# Patient Record
Sex: Male | Born: 1955
Health system: Southern US, Community
[De-identification: ages and names within clinical notes are randomized; demographics above are authoritative.]

## PROBLEM LIST (undated history)

## (undated) DIAGNOSIS — I471 Supraventricular tachycardia, unspecified: Secondary | ICD-10-CM

## (undated) DIAGNOSIS — F32A Depression, unspecified: Secondary | ICD-10-CM

## (undated) DIAGNOSIS — K269 Duodenal ulcer, unspecified as acute or chronic, without hemorrhage or perforation: Secondary | ICD-10-CM

## (undated) DIAGNOSIS — I2510 Atherosclerotic heart disease of native coronary artery without angina pectoris: Secondary | ICD-10-CM

## (undated) DIAGNOSIS — E785 Hyperlipidemia, unspecified: Secondary | ICD-10-CM

## (undated) DIAGNOSIS — N183 Chronic kidney disease, stage 3 unspecified: Secondary | ICD-10-CM

## (undated) DIAGNOSIS — I219 Acute myocardial infarction, unspecified: Secondary | ICD-10-CM

## (undated) DIAGNOSIS — M5415 Radiculopathy, thoracolumbar region: Secondary | ICD-10-CM

## (undated) DIAGNOSIS — R079 Chest pain, unspecified: Secondary | ICD-10-CM

## (undated) DIAGNOSIS — F411 Generalized anxiety disorder: Secondary | ICD-10-CM

## (undated) DIAGNOSIS — I10 Essential (primary) hypertension: Secondary | ICD-10-CM

## (undated) DIAGNOSIS — I639 Cerebral infarction, unspecified: Secondary | ICD-10-CM

## (undated) DIAGNOSIS — R000 Tachycardia, unspecified: Secondary | ICD-10-CM

## (undated) HISTORY — DX: Essential (primary) hypertension: I10

## (undated) HISTORY — DX: Tachycardia, unspecified: R00.0

## (undated) HISTORY — DX: Generalized anxiety disorder: F41.1

## (undated) HISTORY — PX: BRAIN SURGERY: SHX531

## (undated) HISTORY — DX: Radiculopathy, thoracolumbar region: M54.15

## (undated) HISTORY — DX: Chest pain, unspecified: R07.9

## (undated) HISTORY — PX: STOMACH SURGERY: SHX791

## (undated) HISTORY — DX: Duodenal ulcer, unspecified as acute or chronic, without hemorrhage or perforation: K26.9

---

## 2012-08-30 DIAGNOSIS — I671 Cerebral aneurysm, nonruptured: Secondary | ICD-10-CM

## 2012-08-30 HISTORY — DX: Cerebral aneurysm, nonruptured: I67.1

## 2012-08-30 HISTORY — PX: CEREBRAL ANEURYSM REPAIR: SHX164

## 2013-03-16 ENCOUNTER — Emergency Department: Payer: Self-pay | Admitting: Emergency Medicine

## 2013-03-16 LAB — CBC
HCT: 36.4 % — ABNORMAL LOW (ref 40.0–52.0)
HGB: 12.5 g/dL — ABNORMAL LOW (ref 13.0–18.0)
MCHC: 34.3 g/dL (ref 32.0–36.0)
Platelet: 219 10*3/uL (ref 150–440)
RBC: 3.99 10*6/uL — ABNORMAL LOW (ref 4.40–5.90)
RDW: 13.3 % (ref 11.5–14.5)
WBC: 7 10*3/uL (ref 3.8–10.6)

## 2013-03-16 LAB — BASIC METABOLIC PANEL
Anion Gap: 6 — ABNORMAL LOW (ref 7–16)
Calcium, Total: 9 mg/dL (ref 8.5–10.1)
Chloride: 110 mmol/L — ABNORMAL HIGH (ref 98–107)
EGFR (African American): >60
EGFR (Non-African Amer.): >60
Glucose: 138 mg/dL — ABNORMAL HIGH (ref 65–99)
Osmolality: 284 (ref 275–301)
Sodium: 141 mmol/L (ref 136–145)

## 2013-03-16 LAB — PROTIME-INR
INR: 1
Prothrombin Time: 13.7 secs (ref 11.5–14.7)

## 2013-03-16 LAB — TROPONIN I: Troponin-I: <0.02 ng/mL

## 2013-03-17 LAB — TROPONIN I: Troponin-I: <0.02 ng/mL

## 2013-05-14 ENCOUNTER — Emergency Department: Payer: Self-pay | Admitting: Emergency Medicine

## 2013-05-14 LAB — COMPREHENSIVE METABOLIC PANEL
Albumin: 3.6 g/dL (ref 3.4–5.0)
Anion Gap: 5 — ABNORMAL LOW (ref 7–16)
BUN: 18 mg/dL (ref 7–18)
Bilirubin,Total: 0.7 mg/dL (ref 0.2–1.0)
Calcium, Total: 9.6 mg/dL (ref 8.5–10.1)
Chloride: 101 mmol/L (ref 98–107)
Co2: 28 mmol/L (ref 21–32)
EGFR (Non-African Amer.): >60
Glucose: 102 mg/dL — ABNORMAL HIGH (ref 65–99)
Osmolality: 270 (ref 275–301)
Potassium: 3.5 mmol/L (ref 3.5–5.1)
SGOT(AST): 14 U/L — ABNORMAL LOW (ref 15–37)
SGPT (ALT): 19 U/L (ref 12–78)
Sodium: 134 mmol/L — ABNORMAL LOW (ref 136–145)
Total Protein: 7.7 g/dL (ref 6.4–8.2)

## 2013-05-14 LAB — CBC
HCT: 41.7 % (ref 40.0–52.0)
MCV: 88 fL (ref 80–100)
RBC: 4.74 10*6/uL (ref 4.40–5.90)
RDW: 13.3 % (ref 11.5–14.5)

## 2013-05-14 LAB — TROPONIN I: Troponin-I: <0.02 ng/mL

## 2013-06-22 LAB — CBC
HGB: 13.7 g/dL (ref 13.0–18.0)
MCH: 29.4 pg (ref 26.0–34.0)
Platelet: 211 10*3/uL (ref 150–440)
RBC: 4.65 10*6/uL (ref 4.40–5.90)
WBC: 7.1 10*3/uL (ref 3.8–10.6)

## 2013-06-22 LAB — BASIC METABOLIC PANEL
Anion Gap: 8 (ref 7–16)
BUN: 22 mg/dL — ABNORMAL HIGH (ref 7–18)
Calcium, Total: 9 mg/dL (ref 8.5–10.1)
Co2: 26 mmol/L (ref 21–32)
Creatinine: 1.22 mg/dL (ref 0.60–1.30)
Glucose: 118 mg/dL — ABNORMAL HIGH (ref 65–99)

## 2013-06-23 LAB — DRUG SCREEN, URINE
Amphetamines, Ur Screen: NEGATIVE (ref ?–1000)
Cannabinoid 50 Ng, Ur ~~LOC~~: NEGATIVE (ref ?–50)
Cocaine Metabolite,Ur ~~LOC~~: NEGATIVE (ref ?–300)
MDMA (Ecstasy)Ur Screen: NEGATIVE (ref ?–500)
Methadone, Ur Screen: NEGATIVE (ref ?–300)

## 2013-06-23 LAB — CK TOTAL AND CKMB (NOT AT ARMC)
CK, Total: 46 U/L (ref 35–232)
CK-MB: 1.2 ng/mL (ref 0.5–3.6)
CK-MB: 1 ng/mL (ref 0.5–3.6)
CK-MB: 2.5 ng/mL (ref 0.5–3.6)

## 2013-06-23 LAB — HEPATIC FUNCTION PANEL A (ARMC)
Albumin: 3.6 g/dL (ref 3.4–5.0)
Bilirubin, Direct: <0.1 mg/dL (ref 0.00–0.20)
SGOT(AST): 19 U/L (ref 15–37)

## 2013-06-23 LAB — ETHANOL: Ethanol %: <0.003 % (ref 0.000–0.080)

## 2013-06-24 ENCOUNTER — Inpatient Hospital Stay: Payer: Self-pay | Admitting: Specialist

## 2013-06-24 LAB — CBC WITH DIFFERENTIAL/PLATELET
Basophil #: 0 10*3/uL (ref 0.0–0.1)
Eosinophil #: 0.2 10*3/uL (ref 0.0–0.7)
HGB: 14.6 g/dL (ref 13.0–18.0)
Lymphocyte #: 1.5 10*3/uL (ref 1.0–3.6)
Lymphocyte %: 22.7 %
Monocyte #: 0.7 x10 3/mm (ref 0.2–1.0)
Platelet: 216 10*3/uL (ref 150–440)
RDW: 13.9 % (ref 11.5–14.5)
WBC: 6.7 10*3/uL (ref 3.8–10.6)

## 2013-06-24 LAB — BASIC METABOLIC PANEL
BUN: 16 mg/dL (ref 7–18)
Co2: 27 mmol/L (ref 21–32)
EGFR (African American): >60
Sodium: 137 mmol/L (ref 136–145)

## 2013-06-24 LAB — LIPID PANEL
HDL Cholesterol: 37 mg/dL — ABNORMAL LOW (ref 40–60)
Ldl Cholesterol, Calc: 145 mg/dL — ABNORMAL HIGH (ref 0–100)
Triglycerides: 155 mg/dL (ref 0–200)
VLDL Cholesterol, Calc: 31 mg/dL (ref 5–40)

## 2013-06-25 LAB — BASIC METABOLIC PANEL
Anion Gap: 7 (ref 7–16)
Chloride: 103 mmol/L (ref 98–107)
Co2: 26 mmol/L (ref 21–32)
Creatinine: 1.25 mg/dL (ref 0.60–1.30)
EGFR (African American): >60
EGFR (Non-African Amer.): >60
Glucose: 102 mg/dL — ABNORMAL HIGH (ref 65–99)
Sodium: 136 mmol/L (ref 136–145)

## 2013-06-25 LAB — PROTIME-INR
INR: 1
Prothrombin Time: 13.7 secs (ref 11.5–14.7)

## 2013-06-29 ENCOUNTER — Emergency Department: Payer: Self-pay | Admitting: Emergency Medicine

## 2013-06-30 LAB — BASIC METABOLIC PANEL
Anion Gap: 7 (ref 7–16)
BUN: 18 mg/dL (ref 7–18)
Calcium, Total: 9 mg/dL (ref 8.5–10.1)
Co2: 24 mmol/L (ref 21–32)
Creatinine: 1.14 mg/dL (ref 0.60–1.30)
EGFR (Non-African Amer.): >60
Potassium: 3.6 mmol/L (ref 3.5–5.1)
Sodium: 137 mmol/L (ref 136–145)

## 2013-06-30 LAB — CBC
HGB: 14.4 g/dL (ref 13.0–18.0)
MCH: 29.1 pg (ref 26.0–34.0)
MCHC: 33.8 g/dL (ref 32.0–36.0)
MCV: 86 fL (ref 80–100)
RDW: 14 % (ref 11.5–14.5)
WBC: 8 10*3/uL (ref 3.8–10.6)

## 2013-06-30 LAB — PROTIME-INR
INR: 1
Prothrombin Time: 13 secs (ref 11.5–14.7)

## 2013-06-30 LAB — TROPONIN I
Troponin-I: <0.02 ng/mL
Troponin-I: <0.02 ng/mL

## 2013-06-30 LAB — CK TOTAL AND CKMB (NOT AT ARMC): CK, Total: 47 U/L (ref 35–232)

## 2014-01-08 ENCOUNTER — Emergency Department: Payer: Self-pay | Admitting: Emergency Medicine

## 2014-01-08 LAB — COMPREHENSIVE METABOLIC PANEL
ALT: 20 U/L (ref 12–78)
ANION GAP: 5 — AB (ref 7–16)
Albumin: 3.6 g/dL (ref 3.4–5.0)
Alkaline Phosphatase: 58 U/L
BUN: 34 mg/dL — AB (ref 7–18)
Bilirubin,Total: 0.7 mg/dL (ref 0.2–1.0)
CHLORIDE: 106 mmol/L (ref 98–107)
CO2: 25 mmol/L (ref 21–32)
Calcium, Total: 8.6 mg/dL (ref 8.5–10.1)
Creatinine: 1.84 mg/dL — ABNORMAL HIGH (ref 0.60–1.30)
GFR CALC AF AMER: 46 — AB
GFR CALC NON AF AMER: 40 — AB
Glucose: 107 mg/dL — ABNORMAL HIGH (ref 65–99)
Osmolality: 280 (ref 275–301)
Potassium: 4.1 mmol/L (ref 3.5–5.1)
SGOT(AST): 21 U/L (ref 15–37)
Sodium: 136 mmol/L (ref 136–145)
TOTAL PROTEIN: 7.3 g/dL (ref 6.4–8.2)

## 2014-01-08 LAB — CBC
HCT: 41.9 % (ref 40.0–52.0)
HGB: 14.1 g/dL (ref 13.0–18.0)
MCH: 29.3 pg (ref 26.0–34.0)
MCHC: 33.6 g/dL (ref 32.0–36.0)
MCV: 87 fL (ref 80–100)
Platelet: 194 10*3/uL (ref 150–440)
RBC: 4.8 10*6/uL (ref 4.40–5.90)
RDW: 14 % (ref 11.5–14.5)
WBC: 8 10*3/uL (ref 3.8–10.6)

## 2014-01-08 LAB — TROPONIN I

## 2014-01-08 LAB — PROTIME-INR
INR: 1
PROTHROMBIN TIME: 13.4 s (ref 11.5–14.7)

## 2014-01-08 LAB — LIPASE, BLOOD: Lipase: 204 U/L (ref 73–393)

## 2014-01-08 LAB — PRO B NATRIURETIC PEPTIDE: B-TYPE NATIURETIC PEPTID: 65 pg/mL (ref 0–125)

## 2014-01-09 ENCOUNTER — Emergency Department: Payer: Self-pay | Admitting: Emergency Medicine

## 2014-01-10 LAB — BASIC METABOLIC PANEL
Anion Gap: 10 (ref 7–16)
BUN: 23 mg/dL — ABNORMAL HIGH (ref 7–18)
CHLORIDE: 102 mmol/L (ref 98–107)
Calcium, Total: 8.9 mg/dL (ref 8.5–10.1)
Co2: 22 mmol/L (ref 21–32)
Creatinine: 1.42 mg/dL — ABNORMAL HIGH (ref 0.60–1.30)
EGFR (Non-African Amer.): 54 — ABNORMAL LOW
GLUCOSE: 111 mg/dL — AB (ref 65–99)
OSMOLALITY: 273 (ref 275–301)
POTASSIUM: 3.6 mmol/L (ref 3.5–5.1)
Sodium: 134 mmol/L — ABNORMAL LOW (ref 136–145)

## 2014-01-10 LAB — CK TOTAL AND CKMB (NOT AT ARMC)
CK, Total: 84 U/L
CK-MB: 1.5 ng/mL (ref 0.5–3.6)

## 2014-01-10 LAB — CBC
HCT: 42.9 % (ref 40.0–52.0)
HGB: 14.1 g/dL (ref 13.0–18.0)
MCH: 28.9 pg (ref 26.0–34.0)
MCHC: 32.9 g/dL (ref 32.0–36.0)
MCV: 88 fL (ref 80–100)
Platelet: 175 10*3/uL (ref 150–440)
RBC: 4.88 10*6/uL (ref 4.40–5.90)
RDW: 13.9 % (ref 11.5–14.5)
WBC: 7.5 10*3/uL (ref 3.8–10.6)

## 2014-01-10 LAB — PROTIME-INR
INR: 1.1
Prothrombin Time: 14.2 secs (ref 11.5–14.7)

## 2014-01-10 LAB — PRO B NATRIURETIC PEPTIDE: B-Type Natriuretic Peptide: 216 pg/mL — ABNORMAL HIGH (ref 0–125)

## 2014-01-10 LAB — HEPATIC FUNCTION PANEL A (ARMC)
ALT: 20 U/L (ref 12–78)
Albumin: 3.6 g/dL (ref 3.4–5.0)
Alkaline Phosphatase: 62 U/L
Bilirubin, Direct: <0.1 mg/dL (ref 0.00–0.20)
Bilirubin,Total: 0.7 mg/dL (ref 0.2–1.0)
SGOT(AST): 24 U/L (ref 15–37)
Total Protein: 7.4 g/dL (ref 6.4–8.2)

## 2014-01-10 LAB — TROPONIN I

## 2014-01-10 LAB — LIPASE, BLOOD: Lipase: 175 U/L (ref 73–393)

## 2014-01-10 LAB — ETHANOL: Ethanol: <3 mg/dL

## 2014-02-09 ENCOUNTER — Observation Stay: Payer: Self-pay | Admitting: Internal Medicine

## 2014-02-09 LAB — URINALYSIS, COMPLETE
BACTERIA: NONE SEEN
Bilirubin,UR: NEGATIVE
Blood: NEGATIVE
Glucose,UR: NEGATIVE mg/dL (ref 0–75)
Ketone: NEGATIVE
LEUKOCYTE ESTERASE: NEGATIVE
Nitrite: NEGATIVE
Ph: 5 (ref 4.5–8.0)
Protein: NEGATIVE
RBC, UR: NONE SEEN /HPF (ref 0–5)
SPECIFIC GRAVITY: 1.019 (ref 1.003–1.030)
Squamous Epithelial: NONE SEEN
WBC UR: 1 /HPF (ref 0–5)

## 2014-02-09 LAB — CBC WITH DIFFERENTIAL/PLATELET
BASOS PCT: 0.3 %
Basophil #: 0 10*3/uL (ref 0.0–0.1)
Eosinophil #: 0.2 10*3/uL (ref 0.0–0.7)
Eosinophil %: 2.3 %
HCT: 41.7 % (ref 40.0–52.0)
HGB: 13.9 g/dL (ref 13.0–18.0)
Lymphocyte #: 1.1 10*3/uL (ref 1.0–3.6)
Lymphocyte %: 15.4 %
MCH: 29.5 pg (ref 26.0–34.0)
MCHC: 33.4 g/dL (ref 32.0–36.0)
MCV: 88 fL (ref 80–100)
MONO ABS: 0.6 x10 3/mm (ref 0.2–1.0)
MONOS PCT: 8.1 %
Neutrophil #: 5.5 10*3/uL (ref 1.4–6.5)
Neutrophil %: 73.9 %
Platelet: 221 10*3/uL (ref 150–440)
RBC: 4.73 10*6/uL (ref 4.40–5.90)
RDW: 14 % (ref 11.5–14.5)
WBC: 7.4 10*3/uL (ref 3.8–10.6)

## 2014-02-09 LAB — COMPREHENSIVE METABOLIC PANEL
ALBUMIN: 3.4 g/dL (ref 3.4–5.0)
ALK PHOS: 69 U/L
ANION GAP: 8 (ref 7–16)
AST: 14 U/L — AB (ref 15–37)
BILIRUBIN TOTAL: 0.5 mg/dL (ref 0.2–1.0)
BUN: 24 mg/dL — AB (ref 7–18)
CO2: 26 mmol/L (ref 21–32)
Calcium, Total: 8.8 mg/dL (ref 8.5–10.1)
Chloride: 103 mmol/L (ref 98–107)
Creatinine: 1.54 mg/dL — ABNORMAL HIGH (ref 0.60–1.30)
EGFR (African American): 57 — ABNORMAL LOW
EGFR (Non-African Amer.): 49 — ABNORMAL LOW
Glucose: 132 mg/dL — ABNORMAL HIGH (ref 65–99)
OSMOLALITY: 280 (ref 275–301)
Potassium: 4 mmol/L (ref 3.5–5.1)
SGPT (ALT): 21 U/L (ref 12–78)
SODIUM: 137 mmol/L (ref 136–145)
Total Protein: 7.2 g/dL (ref 6.4–8.2)

## 2014-02-09 LAB — TROPONIN I: Troponin-I: <0.02 ng/mL

## 2014-02-09 LAB — LIPASE, BLOOD: Lipase: 132 U/L (ref 73–393)

## 2014-02-10 LAB — CBC WITH DIFFERENTIAL/PLATELET
BASOS ABS: 0 10*3/uL (ref 0.0–0.1)
Basophil %: 0.4 %
EOS ABS: 0.2 10*3/uL (ref 0.0–0.7)
Eosinophil %: 2.7 %
HCT: 38.8 % — AB (ref 40.0–52.0)
HGB: 13.2 g/dL (ref 13.0–18.0)
Lymphocyte #: 0.9 10*3/uL — ABNORMAL LOW (ref 1.0–3.6)
Lymphocyte %: 13 %
MCH: 29.9 pg (ref 26.0–34.0)
MCHC: 34 g/dL (ref 32.0–36.0)
MCV: 88 fL (ref 80–100)
MONO ABS: 0.6 x10 3/mm (ref 0.2–1.0)
Monocyte %: 8.7 %
Neutrophil #: 4.9 10*3/uL (ref 1.4–6.5)
Neutrophil %: 75.2 %
Platelet: 176 10*3/uL (ref 150–440)
RBC: 4.42 10*6/uL (ref 4.40–5.90)
RDW: 13.7 % (ref 11.5–14.5)
WBC: 6.6 10*3/uL (ref 3.8–10.6)

## 2014-02-10 LAB — BASIC METABOLIC PANEL
Anion Gap: 7 (ref 7–16)
BUN: 17 mg/dL (ref 7–18)
CALCIUM: 8.1 mg/dL — AB (ref 8.5–10.1)
CHLORIDE: 110 mmol/L — AB (ref 98–107)
CREATININE: 1.27 mg/dL (ref 0.60–1.30)
Co2: 22 mmol/L (ref 21–32)
EGFR (Non-African Amer.): >60
Glucose: 105 mg/dL — ABNORMAL HIGH (ref 65–99)
Osmolality: 279 (ref 275–301)
Potassium: 3.8 mmol/L (ref 3.5–5.1)
SODIUM: 139 mmol/L (ref 136–145)

## 2014-02-11 LAB — CREATININE, SERUM
Creatinine: 1.25 mg/dL (ref 0.60–1.30)
EGFR (African American): >60
EGFR (Non-African Amer.): >60

## 2014-02-11 LAB — URINE CULTURE

## 2014-02-13 LAB — WOUND CULTURE

## 2014-02-14 LAB — CULTURE, BLOOD (SINGLE)

## 2014-02-17 LAB — WOUND CULTURE

## 2014-03-31 ENCOUNTER — Emergency Department: Payer: Self-pay | Admitting: Emergency Medicine

## 2014-03-31 LAB — URINALYSIS, COMPLETE
Bilirubin,UR: NEGATIVE
Blood: NEGATIVE
GLUCOSE, UR: NEGATIVE mg/dL (ref 0–75)
Hyaline Cast: 2
Ketone: NEGATIVE
LEUKOCYTE ESTERASE: NEGATIVE
Nitrite: NEGATIVE
PROTEIN: NEGATIVE
Ph: 6 (ref 4.5–8.0)
RBC,UR: 2 /HPF (ref 0–5)
SPECIFIC GRAVITY: 1.025 (ref 1.003–1.030)
Squamous Epithelial: <1
WBC UR: 3 /HPF (ref 0–5)

## 2014-04-01 ENCOUNTER — Emergency Department: Payer: Self-pay | Admitting: Emergency Medicine

## 2014-04-01 LAB — URINALYSIS, COMPLETE
BACTERIA: NONE SEEN
BILIRUBIN, UR: NEGATIVE
Hyaline Cast: 2
Leukocyte Esterase: NEGATIVE
Nitrite: NEGATIVE
PH: 5 (ref 4.5–8.0)
Protein: 100
RBC,UR: 2 /HPF (ref 0–5)
Specific Gravity: 1.018 (ref 1.003–1.030)
Squamous Epithelial: NONE SEEN

## 2014-04-01 LAB — CBC
HCT: 44.3 % (ref 40.0–52.0)
HGB: 14.7 g/dL (ref 13.0–18.0)
MCH: 29.8 pg (ref 26.0–34.0)
MCHC: 33.1 g/dL (ref 32.0–36.0)
MCV: 90 fL (ref 80–100)
PLATELETS: 226 10*3/uL (ref 150–440)
RBC: 4.92 10*6/uL (ref 4.40–5.90)
RDW: 14.1 % (ref 11.5–14.5)
WBC: 10.3 10*3/uL (ref 3.8–10.6)

## 2014-04-01 LAB — COMPREHENSIVE METABOLIC PANEL
ALK PHOS: 70 U/L
ANION GAP: 12 (ref 7–16)
AST: 15 U/L (ref 15–37)
Albumin: 4 g/dL (ref 3.4–5.0)
BILIRUBIN TOTAL: 0.5 mg/dL (ref 0.2–1.0)
BUN: 31 mg/dL — ABNORMAL HIGH (ref 7–18)
Calcium, Total: 9.9 mg/dL (ref 8.5–10.1)
Chloride: 99 mmol/L (ref 98–107)
Co2: 23 mmol/L (ref 21–32)
Creatinine: 1.32 mg/dL — ABNORMAL HIGH (ref 0.60–1.30)
EGFR (African American): >60
EGFR (Non-African Amer.): 59 — ABNORMAL LOW
Glucose: 142 mg/dL — ABNORMAL HIGH (ref 65–99)
Osmolality: 277 (ref 275–301)
Potassium: 3.8 mmol/L (ref 3.5–5.1)
SGPT (ALT): 22 U/L
Sodium: 134 mmol/L — ABNORMAL LOW (ref 136–145)
TOTAL PROTEIN: 8.5 g/dL — AB (ref 6.4–8.2)

## 2014-04-01 LAB — LIPASE, BLOOD: LIPASE: 93 U/L (ref 73–393)

## 2014-04-01 LAB — TROPONIN I: Troponin-I: <0.02 ng/mL

## 2014-04-12 ENCOUNTER — Inpatient Hospital Stay: Payer: Self-pay | Admitting: Internal Medicine

## 2014-04-12 HISTORY — PX: ESOPHAGOGASTRODUODENOSCOPY: SHX1529

## 2014-04-12 LAB — CBC
HCT: 21.2 % — ABNORMAL LOW (ref 40.0–52.0)
HGB: 6.8 g/dL — ABNORMAL LOW (ref 13.0–18.0)
MCH: 29.7 pg (ref 26.0–34.0)
MCHC: 32 g/dL (ref 32.0–36.0)
MCV: 93 fL (ref 80–100)
Platelet: 230 10*3/uL (ref 150–440)
RBC: 2.28 10*6/uL — AB (ref 4.40–5.90)
RDW: 14.3 % (ref 11.5–14.5)
WBC: 11.1 10*3/uL — AB (ref 3.8–10.6)

## 2014-04-12 LAB — COMPREHENSIVE METABOLIC PANEL
ANION GAP: 7 (ref 7–16)
AST: 9 U/L — AB (ref 15–37)
Albumin: 2.8 g/dL — ABNORMAL LOW (ref 3.4–5.0)
Alkaline Phosphatase: 40 U/L — ABNORMAL LOW
BUN: 50 mg/dL — ABNORMAL HIGH (ref 7–18)
Bilirubin,Total: 0.3 mg/dL (ref 0.2–1.0)
Calcium, Total: 7.8 mg/dL — ABNORMAL LOW (ref 8.5–10.1)
Chloride: 105 mmol/L (ref 98–107)
Co2: 25 mmol/L (ref 21–32)
Creatinine: 1.56 mg/dL — ABNORMAL HIGH (ref 0.60–1.30)
EGFR (African American): 56 — ABNORMAL LOW
EGFR (Non-African Amer.): 48 — ABNORMAL LOW
Glucose: 168 mg/dL — ABNORMAL HIGH (ref 65–99)
OSMOLALITY: 291 (ref 275–301)
Potassium: 4.3 mmol/L (ref 3.5–5.1)
SGPT (ALT): 28 U/L
Sodium: 137 mmol/L (ref 136–145)
TOTAL PROTEIN: 5.8 g/dL — AB (ref 6.4–8.2)

## 2014-04-12 LAB — PROTIME-INR
INR: 1.1
Prothrombin Time: 13.7 secs (ref 11.5–14.7)

## 2014-04-12 LAB — HEMOGLOBIN: HGB: 7.7 g/dL — ABNORMAL LOW (ref 13.0–18.0)

## 2014-04-12 LAB — LIPASE, BLOOD: LIPASE: 110 U/L (ref 73–393)

## 2014-04-12 LAB — HEMATOCRIT: HCT: 22.9 % — ABNORMAL LOW (ref 40.0–52.0)

## 2014-04-12 LAB — APTT

## 2014-04-13 LAB — URINALYSIS, COMPLETE
Bacteria: NONE SEEN
Bilirubin,UR: NEGATIVE
GLUCOSE, UR: NEGATIVE mg/dL (ref 0–75)
Ketone: NEGATIVE
Leukocyte Esterase: NEGATIVE
Nitrite: NEGATIVE
PROTEIN: NEGATIVE
Ph: 5 (ref 4.5–8.0)
RBC,UR: 6 /HPF (ref 0–5)
Specific Gravity: 1.012 (ref 1.003–1.030)
Squamous Epithelial: NONE SEEN
WBC UR: 1 /HPF (ref 0–5)

## 2014-04-13 LAB — CBC WITH DIFFERENTIAL/PLATELET
BASOS ABS: 0 10*3/uL (ref 0.0–0.1)
BASOS ABS: 0 10*3/uL (ref 0.0–0.1)
BASOS PCT: 0.4 %
Basophil %: 0.3 %
Eosinophil #: 0.2 10*3/uL (ref 0.0–0.7)
Eosinophil #: 0.1 10*3/uL (ref 0.0–0.7)
Eosinophil %: 2.1 %
Eosinophil %: 1.1 %
HCT: 21.5 % — ABNORMAL LOW (ref 40.0–52.0)
HCT: 25.2 % — ABNORMAL LOW (ref 40.0–52.0)
HGB: 7.1 g/dL — ABNORMAL LOW (ref 13.0–18.0)
HGB: 8.4 g/dL — AB (ref 13.0–18.0)
LYMPHS ABS: 1.1 10*3/uL (ref 1.0–3.6)
LYMPHS PCT: 19.3 %
LYMPHS PCT: 11.3 %
Lymphocyte #: 1.7 10*3/uL (ref 1.0–3.6)
MCH: 30.6 pg (ref 26.0–34.0)
MCH: 30 pg (ref 26.0–34.0)
MCHC: 33.3 g/dL (ref 32.0–36.0)
MCHC: 33.2 g/dL (ref 32.0–36.0)
MCV: 92 fL (ref 80–100)
MCV: 90 fL (ref 80–100)
MONOS PCT: 8.5 %
Monocyte #: 0.8 x10 3/mm (ref 0.2–1.0)
Monocyte #: 0.6 x10 3/mm (ref 0.2–1.0)
Monocyte %: 6.8 %
Neutrophil #: 6.2 10*3/uL (ref 1.4–6.5)
Neutrophil #: 7.7 10*3/uL — ABNORMAL HIGH (ref 1.4–6.5)
Neutrophil %: 69.7 %
Neutrophil %: 80.5 %
Platelet: 145 10*3/uL — ABNORMAL LOW (ref 150–440)
Platelet: 132 10*3/uL — ABNORMAL LOW (ref 150–440)
RBC: 2.33 10*6/uL — ABNORMAL LOW (ref 4.40–5.90)
RBC: 2.79 10*6/uL — AB (ref 4.40–5.90)
RDW: 13.9 % (ref 11.5–14.5)
RDW: 14.8 % — ABNORMAL HIGH (ref 11.5–14.5)
WBC: 9 10*3/uL (ref 3.8–10.6)
WBC: 9.5 10*3/uL (ref 3.8–10.6)

## 2014-04-13 LAB — BASIC METABOLIC PANEL
Anion Gap: 7 (ref 7–16)
BUN: 40 mg/dL — ABNORMAL HIGH (ref 7–18)
Calcium, Total: 7.1 mg/dL — ABNORMAL LOW (ref 8.5–10.1)
Chloride: 109 mmol/L — ABNORMAL HIGH (ref 98–107)
Co2: 25 mmol/L (ref 21–32)
Creatinine: 1.18 mg/dL (ref 0.60–1.30)
EGFR (African American): >60
GLUCOSE: 93 mg/dL (ref 65–99)
Osmolality: 291 (ref 275–301)
POTASSIUM: 4.3 mmol/L (ref 3.5–5.1)
SODIUM: 141 mmol/L (ref 136–145)

## 2014-04-13 LAB — MAGNESIUM: MAGNESIUM: 1.8 mg/dL

## 2014-04-14 DIAGNOSIS — K922 Gastrointestinal hemorrhage, unspecified: Secondary | ICD-10-CM | POA: Diagnosis not present

## 2014-04-14 LAB — CBC WITH DIFFERENTIAL/PLATELET
BASOS ABS: 0 10*3/uL (ref 0.0–0.1)
BASOS ABS: 0 10*3/uL (ref 0.0–0.1)
Basophil %: 0.5 %
Basophil %: 0.3 %
EOS ABS: 0.2 10*3/uL (ref 0.0–0.7)
EOS PCT: 0.1 %
Eosinophil #: 0 10*3/uL (ref 0.0–0.7)
Eosinophil %: 3 %
HCT: 20 % — AB (ref 40.0–52.0)
HCT: 35.6 % — ABNORMAL LOW (ref 40.0–52.0)
HGB: 6.8 g/dL — AB (ref 13.0–18.0)
HGB: 12 g/dL — ABNORMAL LOW (ref 13.0–18.0)
LYMPHS ABS: 1.3 10*3/uL (ref 1.0–3.6)
LYMPHS PCT: 3 %
Lymphocyte #: 0.5 10*3/uL — ABNORMAL LOW (ref 1.0–3.6)
Lymphocyte %: 19.6 %
MCH: 30.5 pg (ref 26.0–34.0)
MCH: 29.8 pg (ref 26.0–34.0)
MCHC: 34 g/dL (ref 32.0–36.0)
MCHC: 33.6 g/dL (ref 32.0–36.0)
MCV: 90 fL (ref 80–100)
MCV: 89 fL (ref 80–100)
MONOS PCT: 7.8 %
Monocyte #: 0.5 x10 3/mm (ref 0.2–1.0)
Monocyte #: 1.2 x10 3/mm — ABNORMAL HIGH (ref 0.2–1.0)
Monocyte %: 7.9 %
Neutrophil #: 4.5 10*3/uL (ref 1.4–6.5)
Neutrophil #: 13.5 10*3/uL — ABNORMAL HIGH (ref 1.4–6.5)
Neutrophil %: 69.1 %
Neutrophil %: 88.7 %
PLATELETS: 153 10*3/uL (ref 150–440)
Platelet: 124 10*3/uL — ABNORMAL LOW (ref 150–440)
RBC: 2.23 10*6/uL — ABNORMAL LOW (ref 4.40–5.90)
RBC: 4.01 10*6/uL — ABNORMAL LOW (ref 4.40–5.90)
RDW: 15.4 % — AB (ref 11.5–14.5)
RDW: 14.9 % — ABNORMAL HIGH (ref 11.5–14.5)
WBC: 6.5 10*3/uL (ref 3.8–10.6)
WBC: 15.3 10*3/uL — AB (ref 3.8–10.6)

## 2014-04-14 LAB — COMPREHENSIVE METABOLIC PANEL
ALBUMIN: 2.2 g/dL — AB (ref 3.4–5.0)
ALK PHOS: 36 U/L — AB
Anion Gap: 10 (ref 7–16)
BILIRUBIN TOTAL: 1.4 mg/dL — AB (ref 0.2–1.0)
BUN: 31 mg/dL — ABNORMAL HIGH (ref 7–18)
CHLORIDE: 108 mmol/L — AB (ref 98–107)
CREATININE: 1.14 mg/dL (ref 0.60–1.30)
Calcium, Total: 7.6 mg/dL — ABNORMAL LOW (ref 8.5–10.1)
Co2: 20 mmol/L — ABNORMAL LOW (ref 21–32)
EGFR (African American): >60
EGFR (Non-African Amer.): >60
Glucose: 210 mg/dL — ABNORMAL HIGH (ref 65–99)
Osmolality: 288 (ref 275–301)
Potassium: 5.1 mmol/L (ref 3.5–5.1)
SGOT(AST): 24 U/L (ref 15–37)
SGPT (ALT): 25 U/L
Sodium: 138 mmol/L (ref 136–145)
Total Protein: 4.7 g/dL — ABNORMAL LOW (ref 6.4–8.2)

## 2014-04-14 LAB — BASIC METABOLIC PANEL
Anion Gap: 6 — ABNORMAL LOW (ref 7–16)
BUN: 29 mg/dL — ABNORMAL HIGH (ref 7–18)
CALCIUM: 6.8 mg/dL — AB (ref 8.5–10.1)
CREATININE: 1.06 mg/dL (ref 0.60–1.30)
Chloride: 111 mmol/L — ABNORMAL HIGH (ref 98–107)
Co2: 25 mmol/L (ref 21–32)
EGFR (African American): >60
Glucose: 85 mg/dL (ref 65–99)
Osmolality: 288 (ref 275–301)
Potassium: 4 mmol/L (ref 3.5–5.1)
Sodium: 142 mmol/L (ref 136–145)

## 2014-04-14 LAB — HEMOGLOBIN: HGB: 8.7 g/dL — ABNORMAL LOW (ref 13.0–18.0)

## 2014-04-14 LAB — HEMATOCRIT: HCT: 26.4 % — ABNORMAL LOW (ref 40.0–52.0)

## 2014-04-15 LAB — BASIC METABOLIC PANEL
Anion Gap: 8 (ref 7–16)
BUN: 26 mg/dL — ABNORMAL HIGH (ref 7–18)
Calcium, Total: 7.5 mg/dL — ABNORMAL LOW (ref 8.5–10.1)
Chloride: 108 mmol/L — ABNORMAL HIGH (ref 98–107)
Co2: 24 mmol/L (ref 21–32)
Creatinine: 1.19 mg/dL (ref 0.60–1.30)
EGFR (African American): >60
EGFR (Non-African Amer.): >60
Glucose: 163 mg/dL — ABNORMAL HIGH (ref 65–99)
OSMOLALITY: 288 (ref 275–301)
Potassium: 4.9 mmol/L (ref 3.5–5.1)
Sodium: 140 mmol/L (ref 136–145)

## 2014-04-15 LAB — CBC WITH DIFFERENTIAL/PLATELET
Basophil #: 0 10*3/uL (ref 0.0–0.1)
Basophil %: 0 %
EOS PCT: 0 %
Eosinophil #: 0 10*3/uL (ref 0.0–0.7)
HCT: 31.2 % — ABNORMAL LOW (ref 40.0–52.0)
HGB: 10.2 g/dL — AB (ref 13.0–18.0)
Lymphocyte #: 0.4 10*3/uL — ABNORMAL LOW (ref 1.0–3.6)
Lymphocyte %: 3.2 %
MCH: 29.3 pg (ref 26.0–34.0)
MCHC: 32.6 g/dL (ref 32.0–36.0)
MCV: 90 fL (ref 80–100)
Monocyte #: 1.1 x10 3/mm — ABNORMAL HIGH (ref 0.2–1.0)
Monocyte %: 8.8 %
NEUTROS ABS: 11.5 10*3/uL — AB (ref 1.4–6.5)
NEUTROS PCT: 88 %
PLATELETS: 121 10*3/uL — AB (ref 150–440)
RBC: 3.47 10*6/uL — ABNORMAL LOW (ref 4.40–5.90)
RDW: 15.4 % — ABNORMAL HIGH (ref 11.5–14.5)
WBC: 13.1 10*3/uL — ABNORMAL HIGH (ref 3.8–10.6)

## 2014-04-16 LAB — CBC WITH DIFFERENTIAL/PLATELET
BASOS ABS: 0 10*3/uL (ref 0.0–0.1)
BASOS PCT: 0.3 %
Eosinophil #: 0.1 10*3/uL (ref 0.0–0.7)
Eosinophil %: 1.1 %
HCT: 25.5 % — AB (ref 40.0–52.0)
HGB: 8.7 g/dL — ABNORMAL LOW (ref 13.0–18.0)
LYMPHS PCT: 8.5 %
Lymphocyte #: 0.6 10*3/uL — ABNORMAL LOW (ref 1.0–3.6)
MCH: 30.6 pg (ref 26.0–34.0)
MCHC: 33.9 g/dL (ref 32.0–36.0)
MCV: 90 fL (ref 80–100)
MONO ABS: 0.6 x10 3/mm (ref 0.2–1.0)
Monocyte %: 7.9 %
NEUTROS ABS: 6.1 10*3/uL (ref 1.4–6.5)
NEUTROS PCT: 82.2 %
Platelet: 118 10*3/uL — ABNORMAL LOW (ref 150–440)
RBC: 2.83 10*6/uL — ABNORMAL LOW (ref 4.40–5.90)
RDW: 15.5 % — ABNORMAL HIGH (ref 11.5–14.5)
WBC: 7.4 10*3/uL (ref 3.8–10.6)

## 2014-04-16 LAB — BASIC METABOLIC PANEL
Anion Gap: 8 (ref 7–16)
BUN: 17 mg/dL (ref 7–18)
CHLORIDE: 107 mmol/L (ref 98–107)
CREATININE: 1.22 mg/dL (ref 0.60–1.30)
Calcium, Total: 7.6 mg/dL — ABNORMAL LOW (ref 8.5–10.1)
Co2: 27 mmol/L (ref 21–32)
EGFR (African American): >60
GLUCOSE: 103 mg/dL — AB (ref 65–99)
OSMOLALITY: 285 (ref 275–301)
POTASSIUM: 4.1 mmol/L (ref 3.5–5.1)
Sodium: 142 mmol/L (ref 136–145)

## 2014-04-17 LAB — CK TOTAL AND CKMB (NOT AT ARMC)
CK, Total: 60 U/L
CK, Total: 54 U/L
CK-MB: 2.3 ng/mL (ref 0.5–3.6)
CK-MB: 2.2 ng/mL (ref 0.5–3.6)

## 2014-04-17 LAB — BASIC METABOLIC PANEL
Anion Gap: 12 (ref 7–16)
BUN: 11 mg/dL (ref 7–18)
CALCIUM: 8.1 mg/dL — AB (ref 8.5–10.1)
Chloride: 105 mmol/L (ref 98–107)
Co2: 24 mmol/L (ref 21–32)
Creatinine: 1.19 mg/dL (ref 0.60–1.30)
EGFR (African American): >60
EGFR (Non-African Amer.): >60
Glucose: 77 mg/dL (ref 65–99)
Osmolality: 279 (ref 275–301)
Potassium: 3.7 mmol/L (ref 3.5–5.1)
SODIUM: 141 mmol/L (ref 136–145)

## 2014-04-17 LAB — CBC WITH DIFFERENTIAL/PLATELET
Basophil #: 0.1 10*3/uL (ref 0.0–0.1)
Basophil %: 0.6 %
Eosinophil #: 0.2 10*3/uL (ref 0.0–0.7)
Eosinophil %: 1.9 %
HCT: 27.5 % — AB (ref 40.0–52.0)
HGB: 9.4 g/dL — AB (ref 13.0–18.0)
LYMPHS PCT: 11.1 %
Lymphocyte #: 1 10*3/uL (ref 1.0–3.6)
MCH: 30.9 pg (ref 26.0–34.0)
MCHC: 34.3 g/dL (ref 32.0–36.0)
MCV: 90 fL (ref 80–100)
MONO ABS: 0.7 x10 3/mm (ref 0.2–1.0)
Monocyte %: 7.8 %
Neutrophil #: 7.4 10*3/uL — ABNORMAL HIGH (ref 1.4–6.5)
Neutrophil %: 78.6 %
PLATELETS: 167 10*3/uL (ref 150–440)
RBC: 3.05 10*6/uL — ABNORMAL LOW (ref 4.40–5.90)
RDW: 15.6 % — AB (ref 11.5–14.5)
WBC: 9.5 10*3/uL (ref 3.8–10.6)

## 2014-04-17 LAB — TROPONIN I
TROPONIN-I: 0.17 ng/mL — AB
Troponin-I: 0.21 ng/mL — ABNORMAL HIGH

## 2014-04-18 LAB — CK TOTAL AND CKMB (NOT AT ARMC)
CK, Total: 42 U/L
CK-MB: 2.5 ng/mL (ref 0.5–3.6)

## 2014-04-18 LAB — CBC WITH DIFFERENTIAL/PLATELET
BASOS PCT: 0.6 %
Basophil #: 0.1 10*3/uL (ref 0.0–0.1)
EOS ABS: 0.2 10*3/uL (ref 0.0–0.7)
Eosinophil %: 2.2 %
HCT: 27.1 % — ABNORMAL LOW (ref 40.0–52.0)
HGB: 9.1 g/dL — ABNORMAL LOW (ref 13.0–18.0)
LYMPHS ABS: 0.6 10*3/uL — AB (ref 1.0–3.6)
Lymphocyte %: 7.6 %
MCH: 30.5 pg (ref 26.0–34.0)
MCHC: 33.6 g/dL (ref 32.0–36.0)
MCV: 91 fL (ref 80–100)
Monocyte #: 0.7 x10 3/mm (ref 0.2–1.0)
Monocyte %: 7.8 %
NEUTROS ABS: 6.9 10*3/uL — AB (ref 1.4–6.5)
Neutrophil %: 81.8 %
PLATELETS: 177 10*3/uL (ref 150–440)
RBC: 2.99 10*6/uL — ABNORMAL LOW (ref 4.40–5.90)
RDW: 15.2 % — AB (ref 11.5–14.5)
WBC: 8.4 10*3/uL (ref 3.8–10.6)

## 2014-04-18 LAB — TROPONIN I: Troponin-I: 0.14 ng/mL — ABNORMAL HIGH

## 2014-05-01 ENCOUNTER — Inpatient Hospital Stay: Payer: Self-pay | Admitting: Internal Medicine

## 2014-05-01 LAB — CBC WITH DIFFERENTIAL/PLATELET
Basophil #: 0 10*3/uL (ref 0.0–0.1)
Basophil %: 0.8 %
Eosinophil #: 0.2 10*3/uL (ref 0.0–0.7)
Eosinophil %: 4.6 %
HCT: 30.1 % — ABNORMAL LOW (ref 40.0–52.0)
HGB: 10.1 g/dL — ABNORMAL LOW (ref 13.0–18.0)
LYMPHS PCT: 28.1 %
Lymphocyte #: 1.3 10*3/uL (ref 1.0–3.6)
MCH: 28.8 pg (ref 26.0–34.0)
MCHC: 33.5 g/dL (ref 32.0–36.0)
MCV: 86 fL (ref 80–100)
MONO ABS: 0.5 x10 3/mm (ref 0.2–1.0)
MONOS PCT: 10.1 %
Neutrophil #: 2.7 10*3/uL (ref 1.4–6.5)
Neutrophil %: 56.4 %
Platelet: 339 10*3/uL (ref 150–440)
RBC: 3.51 10*6/uL — ABNORMAL LOW (ref 4.40–5.90)
RDW: 16.8 % — ABNORMAL HIGH (ref 11.5–14.5)
WBC: 4.8 10*3/uL (ref 3.8–10.6)

## 2014-05-01 LAB — URINALYSIS, COMPLETE
BACTERIA: NONE SEEN
BLOOD: NEGATIVE
Bilirubin,UR: NEGATIVE
Glucose,UR: NEGATIVE mg/dL (ref 0–75)
Ketone: NEGATIVE
LEUKOCYTE ESTERASE: NEGATIVE
NITRITE: NEGATIVE
PH: 6 (ref 4.5–8.0)
Protein: NEGATIVE
RBC,UR: 1 /HPF (ref 0–5)
SPECIFIC GRAVITY: 1.035 (ref 1.003–1.030)
Squamous Epithelial: <1
WBC UR: <1 /HPF (ref 0–5)

## 2014-05-01 LAB — CK-MB
CK-MB: <0.5 ng/mL — ABNORMAL LOW (ref 0.5–3.6)
CK-MB: 0.6 ng/mL (ref 0.5–3.6)
CK-MB: <0.5 ng/mL — ABNORMAL LOW (ref 0.5–3.6)

## 2014-05-01 LAB — TROPONIN I
Troponin-I: <0.02 ng/mL
Troponin-I: <0.02 ng/mL
Troponin-I: <0.02 ng/mL

## 2014-05-01 LAB — COMPREHENSIVE METABOLIC PANEL
Albumin: 2.9 g/dL — ABNORMAL LOW (ref 3.4–5.0)
Alkaline Phosphatase: 61 U/L
Anion Gap: 9 (ref 7–16)
BUN: 23 mg/dL — ABNORMAL HIGH (ref 7–18)
Bilirubin,Total: 0.3 mg/dL (ref 0.2–1.0)
CHLORIDE: 105 mmol/L (ref 98–107)
CREATININE: 1.8 mg/dL — AB (ref 0.60–1.30)
Calcium, Total: 8.9 mg/dL (ref 8.5–10.1)
Co2: 24 mmol/L (ref 21–32)
GFR CALC AF AMER: 47 — AB
GFR CALC NON AF AMER: 40 — AB
Glucose: 88 mg/dL (ref 65–99)
Osmolality: 279 (ref 275–301)
POTASSIUM: 4.5 mmol/L (ref 3.5–5.1)
SGOT(AST): 34 U/L (ref 15–37)
SGPT (ALT): 15 U/L
Sodium: 138 mmol/L (ref 136–145)
TOTAL PROTEIN: 7 g/dL (ref 6.4–8.2)

## 2014-05-01 LAB — CK TOTAL AND CKMB (NOT AT ARMC)
CK, Total: 96 U/L
CK-MB: <0.5 ng/mL — ABNORMAL LOW (ref 0.5–3.6)

## 2014-05-02 LAB — BASIC METABOLIC PANEL
Anion Gap: 6 — ABNORMAL LOW (ref 7–16)
BUN: 20 mg/dL — ABNORMAL HIGH (ref 7–18)
CALCIUM: 8.5 mg/dL (ref 8.5–10.1)
CO2: 26 mmol/L (ref 21–32)
Chloride: 108 mmol/L — ABNORMAL HIGH (ref 98–107)
Creatinine: 1.49 mg/dL — ABNORMAL HIGH (ref 0.60–1.30)
EGFR (African American): 59 — ABNORMAL LOW
GFR CALC NON AF AMER: 51 — AB
Glucose: 86 mg/dL (ref 65–99)
Osmolality: 281 (ref 275–301)
POTASSIUM: 3.9 mmol/L (ref 3.5–5.1)
SODIUM: 140 mmol/L (ref 136–145)

## 2014-05-03 LAB — BASIC METABOLIC PANEL
ANION GAP: 9 (ref 7–16)
BUN: 15 mg/dL (ref 7–18)
Calcium, Total: 8.2 mg/dL — ABNORMAL LOW (ref 8.5–10.1)
Chloride: 108 mmol/L — ABNORMAL HIGH (ref 98–107)
Co2: 27 mmol/L (ref 21–32)
Creatinine: 1.49 mg/dL — ABNORMAL HIGH (ref 0.60–1.30)
EGFR (Non-African Amer.): 51 — ABNORMAL LOW
GFR CALC AF AMER: 59 — AB
Glucose: 104 mg/dL — ABNORMAL HIGH (ref 65–99)
Osmolality: 288 (ref 275–301)
Potassium: 3.8 mmol/L (ref 3.5–5.1)
Sodium: 144 mmol/L (ref 136–145)

## 2014-05-03 LAB — CBC WITH DIFFERENTIAL/PLATELET
Basophil #: 0 10*3/uL (ref 0.0–0.1)
Basophil %: 1.1 %
EOS PCT: 5.2 %
Eosinophil #: 0.2 10*3/uL (ref 0.0–0.7)
HCT: 28.8 % — AB (ref 40.0–52.0)
HGB: 9.2 g/dL — ABNORMAL LOW (ref 13.0–18.0)
LYMPHS ABS: 1.1 10*3/uL (ref 1.0–3.6)
Lymphocyte %: 25.1 %
MCH: 28 pg (ref 26.0–34.0)
MCHC: 32 g/dL (ref 32.0–36.0)
MCV: 88 fL (ref 80–100)
MONO ABS: 0.4 x10 3/mm (ref 0.2–1.0)
MONOS PCT: 10.1 %
Neutrophil #: 2.5 10*3/uL (ref 1.4–6.5)
Neutrophil %: 58.5 %
Platelet: 224 10*3/uL (ref 150–440)
RBC: 3.29 10*6/uL — ABNORMAL LOW (ref 4.40–5.90)
RDW: 16.5 % — AB (ref 11.5–14.5)
WBC: 4.3 10*3/uL (ref 3.8–10.6)

## 2014-10-07 ENCOUNTER — Ambulatory Visit: Payer: Self-pay | Admitting: Pain Medicine

## 2014-10-11 IMAGING — CR DG CHEST 2V
1 series · 2 of 2 positions shown · non-contrast
Comparison: none

REASON FOR EXAM: Chest Pain
COMMENTS:

[Series 1: w chest pa · 0.14mm/px · 2 of 2 slices shown]
[im 1/2]
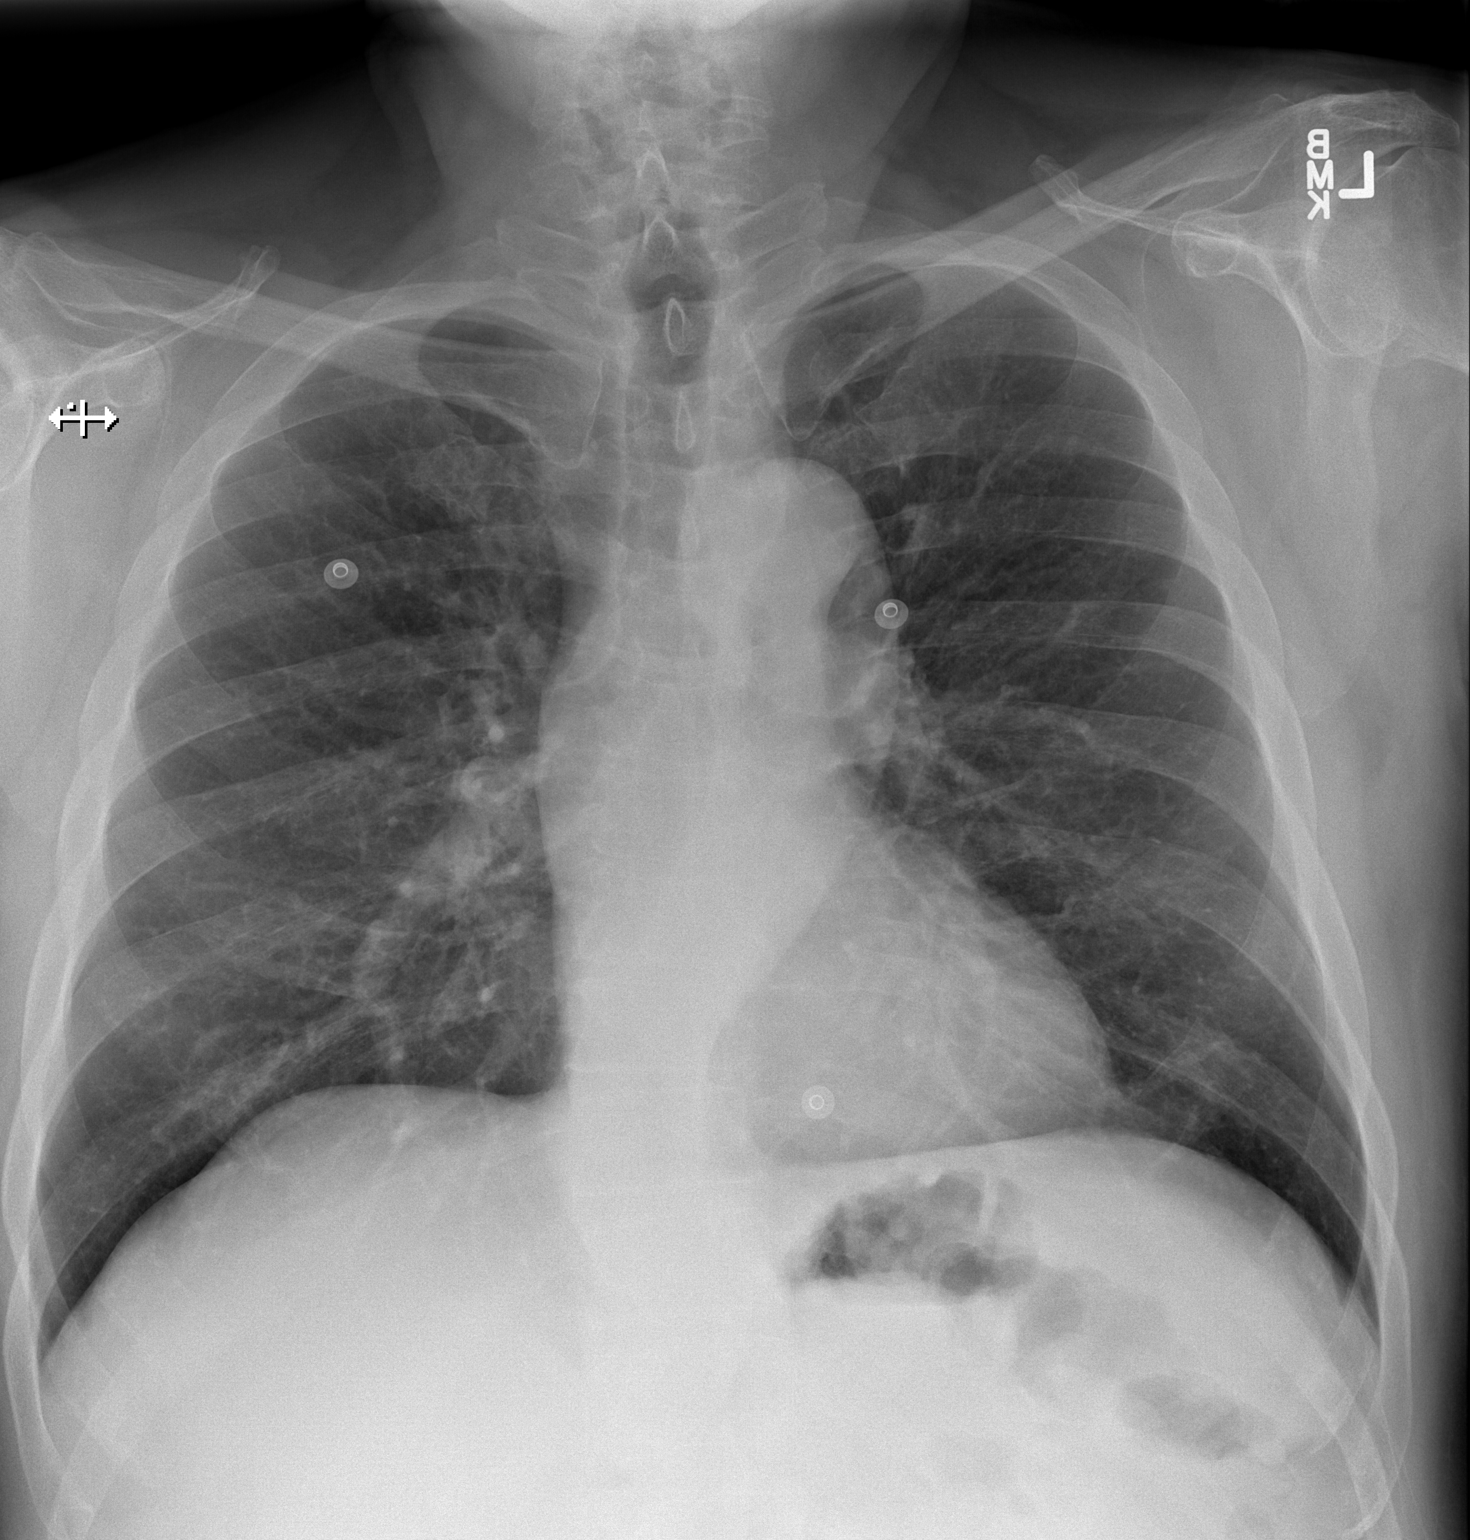
[im 2/2]
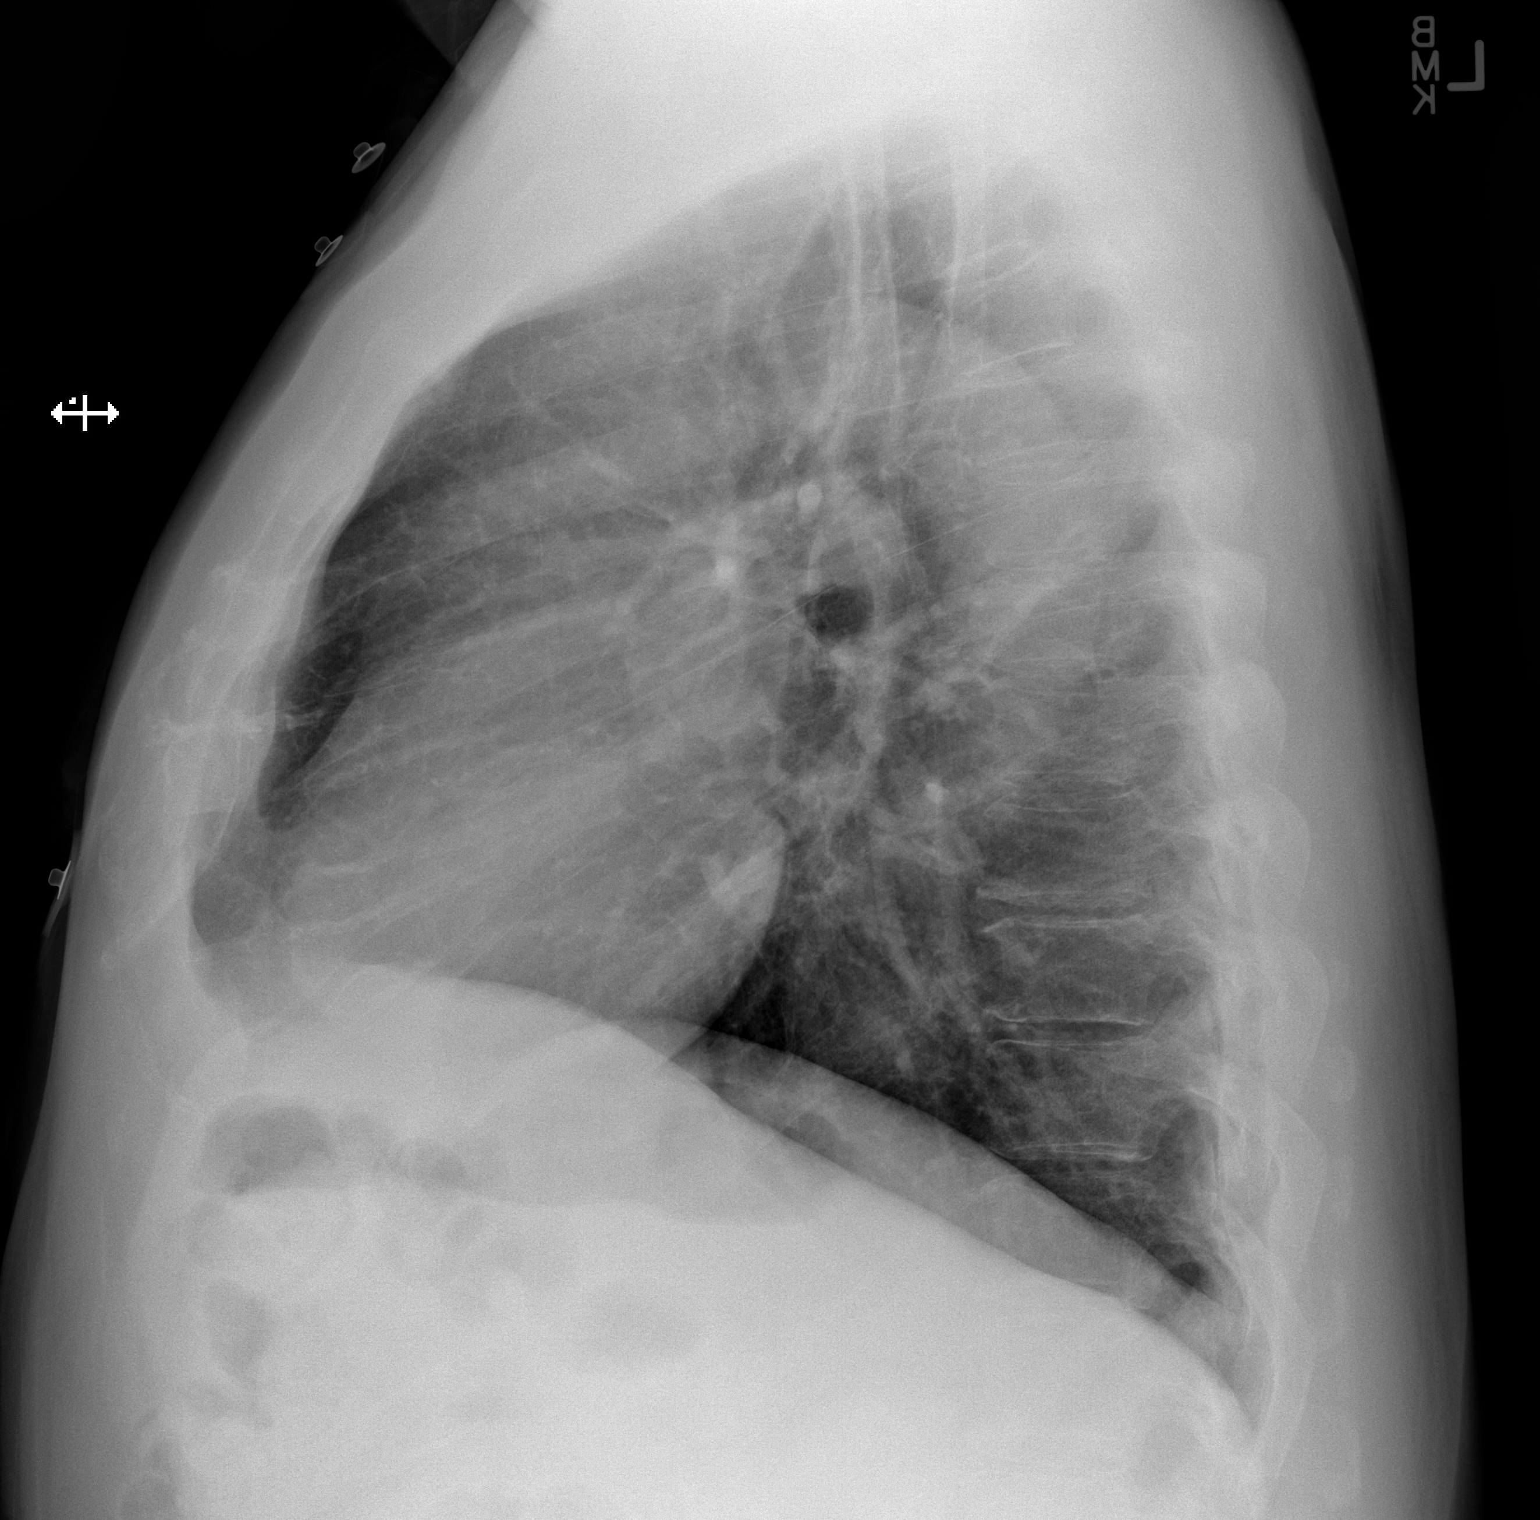

[2 of 2 positions shown; findings below may reference images not displayed]

PROCEDURE:     DXR - DXR CHEST PA (OR AP) AND LATERAL  - June 29, 2013 [DATE]

RESULT:     Comparison is made to the study of 06/23/2013.

The lungs are clear. The heart and pulmonary vessels are normal. The bony
and mediastinal structures are unremarkable. There is no effusion. There is
no pneumothorax or evidence of congestive failure.
IMPRESSION: No acute cardiopulmonary disease. Stable appearance.

[REDACTED]

## 2014-12-20 NOTE — H&P (Signed)
PATIENT NAME:  Howard Weaver, Howard Weaver MR#:  [id number] DATE OF BIRTH:  10-Jun-1956  DATE OF ADMISSION:  06/22/2013  REFERRING PHYSICIAN: Lurline Hare M.D.   PRIMARY CARE PHYSICIAN: None.   CHIEF COMPLAINT: Uncontrolled blood pressure and chest pain.   HISTORY OF PRESENT ILLNESS: This is a 58 year old male with significant past medical history of brain aneurysm status post bleed and clipping, presents with complaints of uncontrolled blood pressure.  The patient  was not feeling well and chest pain. The patient reports that he recently moved from [place] and does not have primary care physician here. The patient lives with his son who helps take care of him. The patient has history of brain aneurysm rupture and intracerebral bleed, status post neurosurgery procedure including clipping and hematoma evacuation; as well, the patient reported this was in July of last year. As well, he is on service of hypertension, uncontrolled; the patient recently moved from [place] here. He does not have a PCP due to financial reasons, the patient reports he has been twice to the ED where he had he had refill for his Catapres prescription. The patient reports he has not been feeling well for the last two days. As well, son reports he has been feeling his father not being himself, slightly confused, more forgetful than his baseline. The patient's CT brain did not show any acute finding, but did show evidence of atrophy, and ischemic vessel disease. The patient had uncontrolled blood pressure upon presentation, which he did require IV hydralazine and nitro paste to control it, as well, he reported chest pain, he had over the last day, reported as midsternal, nonradiating, resolved on its own without any intervention. The patient's troponin was negative. EKG was done, did not show any acute ST or T wave changes, but did show evidence of Q waves in the inferior leads. Hospitalist service were requested to admit the patient for  further evaluation of his chest pain, as well, for better control of his blood pressure.   PAST MEDICAL HISTORY: 1.  Hypertension.  2.  History of brain aneurysm, status post-surgery and clipping 1-1/2 years ago.   FAMILY HISTORY: The patient denies any family history of coronary artery disease at young age.   SOCIAL HISTORY: The patient does not smoke. No illicit drug use. Drinks alcohol occasionally, 1 to 2 beers 2 times a week.   HOME MEDICATIONS:  1.  Clonidine 0.1 mg oral 2 times a day.  2.  Hydrochlorothiazide 25 mg oral daily.   ALLERGIES: REPORTS ALLERGY TO PENCILLIN, ALLERGY TO IBUPROFEN, reports it is just causing a stomach upset.   REVIEW OF SYSTEMS: CONSTITUTIONAL: Denies fever, chills. Complains of fatigue, weakness. Denies weight gain, weight loss.  EYES: Denies blurry vision, double vision, pain, inflammation, glaucoma.  ENT: Denies tinnitus, ear pain, hearing loss, epistaxis or discharge.  RESPIRATORY: Denies cough, wheezing, hemoptysis, dyspnea, COPD. Marland Kitchen  CARDIOVASCULAR: Complains of chest pain, currently resolved. Denies edema, arrhythmia, palpitations or syncope.  GASTROINTESTINAL: Denies nausea, vomiting, diarrhea, abdominal pain, hematemesis, jaundice.  GENITOURINARY: Denies dysuria, hematuria, or renal colic. ENDOCRINE:  Denies  polyuria, polydipsia, heat or cold intolerance.  HEMATOLOGY: Denies anemia, easy bruising, bleeding diathesis.  INTEGUMENTARY: Denies acne, rash or skin lesions.  MUSCULOSKELETAL: Denies any swelling, gout, arthritis or cramps.  NEUROLOGICAL: Denies any ataxia, dementia, tremors, vertigo, complains of headache currently resolved.   PSYCHIATRIC: Denies insomnia, bipolar disorder, substance or alcohol abuse. Reports anxiety.   PHYSICAL EXAMINATION: VITAL SIGNS:  Pulse 74, respiratory rate  18, blood pressure 155/98, 97% on room air.  GENERAL: Elderly male, lays comfortable in bed, in no apparent distress.  HEENT: Head normocephalic. Pupils  equal, reactive to light. Pink conjunctivae. Anicteric sclerae. Moist oral mucosa, had midline surgical scar at the back of his head.   NECK: Supple. No thyromegaly. No JVD.  CHEST: Good air entry bilaterally. No wheezing, rales, rhonchi.  CARDIOVASCULAR: S1, S2 heard. No rubs, murmurs or gallops.   ABDOMEN: Soft, nontender, nondistended. Bowel sounds present.  EXTREMITIES: No edema. No clubbing. No cyanosis, Pedal pulses felt bilaterally.  PSYCHIATRIC: Appropriate affect. Awake, alert x 3. Intact judgment and insight.  NEUROLOGIC: Cranial nerves grossly intact. Motor 5 out of 5. No focal deficits.  LYMPHATICS: No cervical or supraclavicular lymphadenopathy.  MUSCULOSKELETAL: No joint effusion or erythema.   PERTINENT LABORATORY DATA: Glucose 118, BUN 22, creatinine 1.22, sodium 138, potassium 3.3, chloride 104, CO2 26, anion gap 8. Troponin less than 0.02. CK total of 57, ALT 19, AST 19, alk phos 74. Urine drugs negative for the entire panel. White blood cells 7.1, hemoglobin 13.7, hematocrit 40, platelets 211. EKG showing normal sinus rhythm at 68 beats with Q waves in the inferior leads.   ASSESSMENT AND PLAN: 1.  Chest pain. This is most likely related to his uncontrolled hypertension. The patient already received 324 of aspirin in ED. Will be admitted to telemetry unit. We will continue to cycle his cardiac enzymes. Currently, the patient is chest pain-free.  2. Hypertension, uncontrolled. We will resume the patient back on his home meds, hydrochlorothiazide and clonidine. We will add Norvasc and will have him on p.r.n. intravenous hydralazine as needed.  3. Deep vein thrombosis prophylaxis. The patient is ambulatory. We will have him on sequential compression device and TED hose.   Total time spent on admission and patient care: 40 minutes.    ____________________________ Albertine Patricia, MD dse:NTS D: 06/23/2013 04:37:47 ET T: 06/23/2013 04:54:47 ET JOB#: [id number]  cc: Albertine Patricia, MD, <Dictator> Jimmi Sidener Graciela Husbands MD ELECTRONICALLY SIGNED 06/23/2013 6:45

## 2014-12-20 NOTE — Discharge Summary (Signed)
PATIENT NAME:  Howard Weaver, Howard Weaver MR#:  [id number] DATE OF BIRTH:  Nov 11, 1955  DATE OF ADMISSION:  06/24/2013 DATE OF DISCHARGE:  06/25/2013  For detailed note, please take a look at the history and physical done on admission by Dr. Randol Weaver.    DIAGNOSES AT DISCHARGE: As follows: Malignant hypertension, elevated troponin likely in the setting of malignant hypertension, back pain.   DIET: The patient is being discharged on a low-sodium diet.   ACTIVITY: As tolerated.   FOLLOWUP: With Dr. Adrian BlackwaterShaukat Weaver in the next 1 week.   DISCHARGE MEDICATIONS: Catapres 0.1 mg b.i.d., Ultram 50 mg every 4 hours as needed for pain, Imdur 30 mg daily, hydralazine 50 mg b.i.d. and aspirin 81 mg daily.   CONSULTANTS DURING THE HOSPITAL COURSE: Dr. Adrian BlackwaterShaukat Weaver from cardiology.   PERTINENT STUDIES DONE DURING THE HOSPITAL COURSE: As follows: CT scan of the head done without contrast showing chronic involutional changes without evidence of acute abnormalities. A chest x-ray done on admission showing no acute cardiopulmonary disease. A cardiac catheterization done on October 27th showing distal 40% stenosis of the LAD but no significant coronary artery disease that needed acute intervention.   HOSPITAL COURSE: This is a 59 year old male who presented to the hospital with chest pain and noted to have significantly elevated blood pressures.  1. Malignant hypertension: When the patient presented to the hospital, his systolic blood pressures were over 170s, diastolics over 110. The patient was restarted back on his clonidine and his HCTZ. The patient was also maintained on some p.r.n. labetalol and hydralazine. A urine toxicology was also obtained to see if he had any drugs in his system to explain his malignant hypertension as he was compliant with his medications, although his urine tox was negative. After getting supportive therapy with some IV antihypertensives and being resumed on his home meds, his hemodynamics have  improved. His present regimen is going to be clonidine twice daily along with Imdur and hydralazine. The patient will continue close followup with cardiology as an outpatient.  2. Chest pain. The likely cause of the patient's chest pain was probably related to malignant hypertension. His third troponin was mildly elevated at 0.2. Therefore, there was some concern for underlying CAD. The patient underwent a cardiac catheterization done by Dr. Welton Weaver which showed only a distal 40% LAD stenosis but no significant CAD that needed acute intervention. The patient presently is being discharged on aspirin and also on a statin as he is hyperlipidemic, and he will continue his hydralazine and Imdur and clonidine as stated.  3. Lower back pain. The patient apparently had a fall prior to coming in. X-ray was obtained of his lumbar spine which showed no acute fracture. He will continue p.r.n. tramadol for his pain.  4. Hypokalemia. This was likely secondary to the HCTZ he was taking. His potassium has since been supplemented and now resolved. His HCTZ has now been discontinued.  5. Hyperlipidemia. His total cholesterol was 213. His LDL was over 140. I have started him on some low-dose Pravachol prior to discharge.   CODE STATUS: The patient is a FULL CODE.   TIME SPENT ON DISCHARGE: 40 minutes.  ____________________________ Howard Weaver. Howard KaiserSainani, Weaver vjs:gb D: 06/25/2013 15:53:28 ET T: 06/26/2013 00:22:24 ET JOB#: [id number]  cc: Howard Weaver. Howard KaiserSainani, Weaver, <Dictator> Howard NancyShaukat A. Khan, Weaver Howard Weaver ELECTRONICALLY SIGNED 06/27/2013 14:34

## 2014-12-20 NOTE — Consult Note (Signed)
PATIENT NAME:  Howard Weaver, Howard Weaver MR#:  [id number] DATE OF BIRTH:  12-22-1955  DATE OF CONSULTATION:  06/24/2013  CONSULTING PHYSICIAN:  Laurier NancyShaukat A. Deone Omahoney, MD  INDICATION FOR CONSULTATION: Elevated troponin, uncontrolled hypertension.   HISTORY OF PRESENT ILLNESS: This is a 59 year old white male with a past medical history of berry aneurysm clipping in the past after having a CVA, which was hemorrhagic CV[place] in [place]. He came into the hospital with elevated blood pressure, dizziness, headache and chest pain. He had mildly elevated troponin after 2 sets of cardiac enzymes initially being negative. Thus, I was asked to evaluate the patient. The patient still has intermittent pressure-type chest pain and intermittent burning-type chest pain associated with shortness of breath.   PAST MEDICAL HISTORY: History of hypertension. History of berry aneurysm, status post surgical clipping 1-1/2 years ago in [place]. He lived in [place].   FAMILY HISTORY: Positive for coronary artery disease.   SOCIAL HISTORY: Denies EtOH abuse or smoking.   MEDICATIONS: Clonidine and hydrochlorothiazide.   ALLERGIES: PENICILLIN. He denies allergy to ibuprofen and aspirin, he just gets upset stomach, is not true allergy.   PHYSICAL EXAMINATION: GENERAL: He is alert, oriented x 3, in no acute distress.  VITAL SIGNS: His blood pressure is 123/80, respirations 20, pulse 100, temperature 98.6, saturation 95.  HEENT:  No JVD.  LUNGS: Clear.  HEART: Regular rate and rhythm. Normal S1, S2. No audible murmur.  ABDOMEN: Soft, nontender, positive bowel sounds.  EXTREMITIES: No pedal edema.  NEUROLOGIC: The patient appears to be intact.   EKG shows normal sinus rhythm, old inferior wall MI, with Q wave in leads II, III, aVF, left atrial enlargement, nonspecific ST-T changes.   LABS: His cholesterol is 213, triglycerides 155, LDL is 145. BUN is 16, creatinine 1.19. His hematocrit is 42.3. His troponin is 0.21, initial ones  were negative.   ASSESSMENT AND PLAN: Elevated troponin, with history of Q wave in the inferior leads, and history of multiple risk factors for coronary artery disease.   PLAN: To proceed with catheterization.   Thank you very much for the referral.    ____________________________ Laurier NancyShaukat A. Hibo Blasdell, MD sak:mr D: 06/24/2013 16:32:00 ET T: 06/24/2013 17:10:57 ET JOB#: [id number]  cc: Laurier NancyShaukat A. Yasmen Cortner, MD, <Dictator> Laurier NancySHAUKAT A Malie Kashani MD ELECTRONICALLY SIGNED 06/25/2013 15:19

## 2014-12-20 NOTE — Consult Note (Signed)
Brief Consult Note: Diagnosis: NSTEMI.   Comments: Full consult to follow,Uncontrolled HTN improving, will start lovenox as has elevated troponin.  Electronic Signatures: Radene KneeKhan, Ceri Mayer Ali (MD)  (Signed 25-Oct-14 23:25)  Authored: Brief Consult Note   Last Updated: 25-Oct-14 23:25 by Radene KneeKhan, Abiola Behring Ali (MD)

## 2014-12-21 NOTE — Consult Note (Signed)
Pt complains of gassy abd discomfort.  P up to 124, BP 136/100, chest clear anterior fields, abd not tender.  His color looks good, much like last night.  his hgb was 7.1 this morning and given a hx of cardiac disease i will transfuse one unit today.  After this if no obvious contraindications I think he can go to the floor unit. may have water to drink today, start clear liq tomorrow.  Surgery following and aware of magnitude of  ulcer.  Electronic Signatures: [name], [name] (MD)  (Signed on 15-Aug-15 10:02)  Authored  Last Updated: 15-Aug-15 10:02 by [name], Lorah Kalina T (MD)

## 2014-12-21 NOTE — Op Note (Signed)
PATIENT NAME:  Howard Weaver, [name] MR#:  [id number] DATE OF BIRTH:  01/11/1956  DATE OF PROCEDURE:  04/14/2014  ATTENDING PHYSICIAN: Salome Holmeshris Namiah Dunnavant, MD   ASSISTANT: Marshia Lyandy Ely, MD  PREOPERATIVE DIAGNOSES: 1. Hypotension, tachycardia.  2. Acute gastrointestinal blood loss from duodenal ulcer.  3. Anemia.   POSTOPERATIVE DIAGNOSES:   1. Hypotension, tachycardia. 2. Acute gastrointestinal blood loss from duodenal ulcer. 3. Anemia.   PROCEDURES PERFORMED:  1. Exploratory laparotomy.  2. Duodenotomy with suture ligature of previously bleeding vessel.  3. Omental patching of duodenotomy.   ESTIMATED BLOOD LOSS: 15 mL.  COMPLICATIONS: None.   SPECIMENS: None.   ANESTHESIA: General endotracheal.   INDICATION FOR SURGERY: The patient is a pleasant 59 year old male with history of recurrent drops in his hemoglobin, and this morning tachycardia and narrow pulse pressure, and hypotension, and hematemesis. There was concern with re-bleed from duodenal ulcer. He was thus brought to the Operating Room for surgical evaluation and control of bleeding duodenal ulcer.   DETAILS OF PROCEDURE: Following explanation of benefits and potential complications of procedure informed consent was obtained, the patient brought to the Operating Room. He was induced. Endotracheal tube was placed. General anesthesia was administered. His abdomen was prepped and draped in standard sterile fashion. A timeout was then performed. Correctly identified patient name, operative procedure to be performed. A supraumbilical incision was made. This was deepend down into the fascia. The fascia was incised. The peritoneum was entered. There was no obvious free air or blood in the abdomen. The stomach was visualized. Duodenum was covered by the right colon. The right hepatic flexure was mobilized. A mild Kocher maneuver was performed and the pylorus and duodenal bulb and second portion of the duodenum were all cleared off. A  Bookwalter retractor was placed to facilitate retraction. An incision was made distal to what was thought to be the pylorus. There was no obvious ulcer there. As I extended proximally this incision, I notice that what I had mistaken for the pylorus was actually the ulcer. The incision was lengthened across the duodenal bulb. The ulcer was visualized. There was a nonbleeding vessel at the peak. Three separate sutures were used to ligate what was thought to be the gastroduodenal artery. The artery was also cauterized. There was clot adhesion inferior to the base of the ulcer. The 3-0 Vicryl interrupted sutures were used to imbricate the mucosa over the ulcer to allow if there is rebleed, possible tamponade of ulcer and clotting. After I was happy with hemostasis of the previously placed sutures, the wound was closed in 3 layers and 2 layers. I was unable to do this transversely due to the length of the incision, but it was closed with minimal stricturing with a running 3-0 Vicryl inner layer and a 3-0 interrupted silk Lembert outer layer. Two sutures were placed to allow an omental patching over this wound. A JP was placed over the repair. An NG was placed in the appropriate position to allow gastric decompression while this heals. After the abdomen was irrigated and all gauze pads were removed, the midline wound was closed with 2 running looped #1 PDS and  tied in the midline. Staples were used to close the skin and Telfa pad with Tegaderm and paper tape were used to complete the dressing. The patient was then awakened, extubated, and brought to the postanesthesia care unit. There were no immediate complications. Needle, sponge, and instrument counts were correct at the end of the procedure.     ____________________________  Divon Krabill A. Beronica Lansdale, MD cal:ls D: 04/17/2014 08:34:00 ET T: 04/17/2014 15:06:52 ET JOB#: [id number]  cc: Cristal Deer A. Ritta Hammes, MD, <Dictator> Jarvis Newcomer  MD ELECTRONICALLY SIGNED 04/29/2014 13:27

## 2014-12-21 NOTE — H&P (Signed)
PATIENT NAME:  Howard Weaver, RASE MR#:  [id number] DATE OF BIRTH:  1956-08-18  DATE OF ADMISSION:  02/09/2014  ADMITTING PHYSICIAN: Enid Baas, MD  PRIMARY MEDICAL DOCTOR: Laurier Nancy, MD  CHIEF COMPLAINT: Greenish discharge from a boil on the back and also abdominal pain.   HISTORY OF PRESENT ILLNESS: Mr. Kamara is a 59 year old Caucasian male with past medical history significant for uncontrolled hypertension, history of ruptured brain aneurysm status post clipping about 2-1/2 years ago in [place], comes to the hospital secondary to discharge from a boil on his back. The patient stated he has a soft boil on his left upper back for several years now. Had prior discharge and infection, for which he was treated, but over the last few days it started to cause pain, so the son was applying some over-the-counter topical ointment to that and it was discharging whitish substance, but since yesterday was discharging greenish material and the patient was having throbbing pain so came to the ER. He was also noted to be hypotensive with blood pressure 80s/60s. No  white count. No fevers or chill. He started on antibiotics and is being admitted for his hypotension and the infection. Of note, patient also complaining of mid abdomen abdominal pain, which has been almost going on for a month now. The patient was in the ER for similar complaints about a month ago in May 2015, and a CT of the abdomen was done, which was completely normal. Now an x-ray is done and it shows some stool in the colon and small bowel dilatation. No obstruction noted.   PAST MEDICAL HISTORY: 1.  Uncontrolled hypertension.  2.  Brain aneurysm, status post surgery and clipping about 2-1/2 years ago.  PAST SURGICAL HISTORY: Brain aneurysm repair.   ALLERGIES TO MEDICATIONS:  1.  PENICILLIN CAUSES RASH.  2.  IBUPROFEN CAUSES STOMACH UPSET.   CURRENT HOME MEDICATIONS:  1.  Hydralazine 100 mg p.o. b.i.d.  2.  Clonidine 0.3 mg  p.o. b.i.d.  3.  Metoprolol 50 mg p.o. b.i.d.  4.  Enalapril 20 mg p.o. b.i.d.  5.  BuSpar 7.5 mg p.o. b.i.d.  6.  Meloxicam 15 mg p.o. daily.  7.  Hydrochlorothiazide 12.5 mg in the morning.  8.  Hydrochlorothiazide 25 mg at bedtime.  9.  Pravachol 20 mg p.o. at bedtime.  10.  Imdur 30 mg p.o. daily.   SOCIAL HISTORY: Lives at home with his son. No smoking, alcohol or drug abuse.   FAMILY HISTORY: Significant for mom with melanoma and dad with heart disease.  REVIEW OF SYSTEMS:    CONSTITUTIONAL: No fever, fatigue or weakness.  EYES: Positive for blurry vision. No inflammation or glaucoma.  EARS, NOSE, THROAT: No tinnitus, ear pain, hearing loss, epistaxis or discharge.  RESPIRATORY: No cough, wheeze, hemoptysis or COPD.  CARDIOVASCULAR: No chest pain, orthopnea, edema, arrhythmia, palpitations or syncope.  GASTROINTESTINAL: No nausea, vomiting, diarrhea. Positive for abdominal pain. No hematemesis or melena.  GENITOURINARY: No dysuria, hematuria, renal calculus, frequency or incontinence.  ENDOCRINE: No polyuria, nocturia, thyroid problems, heat or cold intolerance.  HEMATOLOGIC: No anemia, easy bruising or bleeding.  SKIN: No acne, rash or lesions.  MUSCULOSKELETAL: No neck, back, shoulder pain, arthritis or gout.  NEUROLOGIC: No numbness, weakness, CVA, TIA or seizures.  PSYCHOLOGICAL: No anxiety, insomnia, depression.   PHYSICAL EXAMINATION: VITAL SIGNS: Temperature 98.3 degrees Fahrenheit, pulse 92, respirations 20, blood pressure 94/71, pulse ox 94% on room air.  GENERAL: Well-built, well-nourished male lying in bed,  not in any acute distress.  HEENT: Normocephalic, atraumatic. Pupils equal, round, reacting to light. Anicteric sclerae. Extraocular movements intact. Oropharynx clear without erythema, mass or exudates. Extremely poor dentition.  NECK: Supple. No thyromegaly, JVD or carotid bruits. No lymphadenopathy.  LUNGS: Moving air bilaterally. No wheeze or crackles. No use  of accessory muscles for breathing.  CARDIOVASCULAR: S1, S2, regular rate and rhythm. No murmurs, rubs or gallops.  ABDOMEN: Soft, nontender, nondistended. No hepatosplenomegaly. Normal bowel sounds. EXTREMITIES: No pedal edema, no clubbing or cyanosis, 2+ dorsalis pedis pulses palpable bilaterally.  SKIN: There is a 5 x 4 cm abscess in the left upper back and left upper back with incision and packing in place. No other rashes or acne noted.  LYMPHATICS: No cervical lymphadenopathy.  NEUROLOGIC: Cranial nerves intact. No focal motor or sensory deficits.  PSYCHOLOGICAL: The patient is awake, alert, oriented x 3.   LABORATORY DATA: WBC 7.4, hemoglobin 13.9, hematocrit 41.7, platelet count 221.   Sodium 137, potassium 4.0, chloride 103, bicarbonate 26, BUN 24, creatinine 1.54, glucose 132, and calcium of 8.8.   ALT 21, AST 14, alkaline phosphatase 69, total bilirubin 0.5, albumin 3.4. Lipase is 132. Troponin less than 0.02.   Abdominal x-ray showing several slightly distended air-filled loops of small bowel with mild small bowel wall thickening. Process such as enteritis could  present in this fashion. Stool noted in colon. No free air.   ASSESSMENT AND PLAN: A 59 year old male with hypertension, anxiety, comes with boil on his back with greenish discharge, noted to be hypotensive and in acute renal failure.  1.  Abscess on the back, status post incision and drainage. Cultures were sent. Blood cultures have also been sent. Start on IV fluids, vancomycin and aztreonam due to PENICILLIN ALLERGY. Likely discharge tomorrow if blood pressure is stable on p.o. antibiotics.  2.  Hypertension, likely from taking too many blood pressure medications at home and also with the underlying infection. Hold his blood pressure medications. Continue IV fluids and monitor.  3.  Acute renal failure, likely prerenal in nature from the infection and also being on HCTZ and enalapril. Hold blood pressure medications. IV  fluids and recheck in a.m.  4.  Anxiety. Continue BuSpar.  5.  Back pain. Continue meloxicam.   CODE STATUS: Full code.   TIME SPENT ON ADMISSION: 50 minutes.    ____________________________ Enid Baasadhika Briahna Pescador, MD rk:jcm D: 02/09/2014 15:06:54 ET T: 02/09/2014 16:04:25 ET JOB#: [id number]  cc: Enid Baasadhika Vernadette Stutsman, MD, <Dictator> Laurier NancyShaukat A. Khan, MD Enid BaasADHIKA Dimetrius Montfort MD ELECTRONICALLY SIGNED 02/10/2014 14:33

## 2014-12-21 NOTE — Consult Note (Signed)
I will sign off, reconsult if I can be of service.  Electronic Signatures: [name], Lijah Bourque T (MD)  (Signed on 17-Aug-15 12:55)  Authored  Last Updated: 17-Aug-15 12:55 by [name], Amanuel Sinkfield T (MD)

## 2014-12-21 NOTE — Consult Note (Signed)
PATIENT NAME:  Howard Weaver, Howard Weaver MR#:  [id number] DATE OF BIRTH:  1956/07/31  DATE OF CONSULTATION:  05/02/2014  REFERRING PHYSICIAN:   CONSULTING PHYSICIAN:  Laurier NancyShaukat A. Lessa Huge, MD  HISTORY OF PRESENT ILLNESS: This is a 59 year old white male with a past medical history of coronary artery disease who presented to the Emergency Room yesterday with severe shortness of breath and chest pain. He apparently was recently discharged after having surgery for duodenal ulcer. Since then he was doing better until recently. He did have some atelectasis in his lower lung fields, but was not given any incentive spirometry equipment prior to discharge. The patient at this time denies any chest pain or shortness of breath, is feeling much better.   PAST MEDICAL HISTORY: He has history of coronary artery disease. He had a CT of coronaries in the office which showed no significant coronary disease. Also has a history of hypertension and peptic ulcer disease status post surgery of the peptic ulcer on the last admission just 2 weeks ago. Also has a history of brain aneurysm.   SOCIAL HISTORY: The patient does not smoke at this time, used to smoke in the past. No EtOH abuse, but does have some social drinking.  FAMILY HISTORY: Positive for diabetes.   HOME MEDICATIONS: Clonidine, isosorbide, promethazine, metoprolol, hydrochlorothiazide, hydralazine.   ALLERGIES: CLINDAMYCIN, IBUPROFEN AND PENICILLIN.   PHYSICAL EXAMINATION: GENERAL: He is alert and oriented x3, in no acute distress right now.  VITAL SIGNS: Temperature 97.7, pulse 55, respirations 18, blood pressure 124/84, and saturation is 100.  NECK: No JVD.  LUNGS: In the lower lung field, there is decreased air entry.  HEART: Regular rate and rhythm. Normal S1, S2. No audible murmur.  ABDOMEN: Soft. Slight tenderness in the epigastrium.  EXTREMITIES: No pedal edema.  NEUROLOGIC: The patient appears to be intact.  DIAGNOSTIC DATA: His chest x-ray findings  read by the radiologist showed improving bilateral atelectasis versus infiltrate.   EKG showed normal sinus rhythm, 62 beats per minute, old anteroseptal wall MI, nonspecific ST-T changes.   BUN is 20 and creatinine 1.49. Troponins were negative x3.   ASSESSMENT AND PLAN: Atypical chest pain, most likely secondary to atelectasis of the lower lung field. He had CT of coronaries in the office which showed no significant coronary artery disease. He also had echocardiogram which showed normal ejection fraction in the office. He is recovering from surgery which was done 2 weeks ago and has bilateral atelectasis on chest x-ray. Advise continuation of spirometry and maybe physical therapy and continue antibiotics. No intervention, by cardiac point of view, at this time is needed.   Thank you very much for the referral.  ____________________________ Laurier NancyShaukat A. Rosalena Mccorry, MD sak:sb D: 05/02/2014 08:51:20 ET T: 05/02/2014 09:03:57 ET JOB#: [id number]  cc: Laurier NancyShaukat A. Tam Savoia, MD, <Dictator> Laurier NancySHAUKAT A Kealii Thueson MD ELECTRONICALLY SIGNED 05/16/2014 13:30

## 2014-12-21 NOTE — Consult Note (Signed)
Pt with onset of UGI bleeding with vomting of blood and signif drop in hgb.  He is tachycardiac which he has been previously.  He has failed medical management and likely will need surgery for this duodenal ulcer after he has been given appropriate amount of blood and fluids.  Chest clear and heart shows no murmur I can hear. abd not distended.  color more pale than yesterday. Discussed with patient and Dr Juliann PulseLundquist.  Electronic Signatures: [name], Daissy Yerian T (MD)  (Signed on 16-Aug-15 10:19)  Authored  Last Updated: 16-Aug-15 10:19 by [name], Mitsuru Dault T (MD)

## 2014-12-21 NOTE — Discharge Summary (Signed)
PATIENT NAME:  Howard Weaver, Howard Weaver MR#:  [id number] DATE OF BIRTH:  April 24, 1956  DATE OF ADMISSION:  04/12/2014 DATE OF DISCHARGE:  04/25/2014  ADMITTING PHYSICIAN:  Starleen Arms, MD.  DISCHARGE PHYSICIAN:  Sheikh A. Ellsworth Lennox, MD.  ADMITTING DIAGNOSES:  1.  Anemia.  2.  Gastrointestinal bleed.   DISCHARGE DIAGNOSES:  1.  Acute blood loss anemia.  2.  Bleeding duodenal ulcer.   PROCEDURES:  Duodenectomy on 04/14/2014, by Dr. Ida Rogue.   IMAGING: Upper GI endoscopy, by Dr. Lynnae Prude, which revealed bleeding duodenal ulcer with vessel in the base.   CONSULTATIONS:  1.  Gastroenterology, Lynnae Prude, MD.   2.  General surgery, Ida Rogue, MD.   3.  Vascular surgery, Festus Barren, MD.   4.  Cardiology, Adrian Blackwater, MD.  HISTORY OF PRESENT ILLNESS: This gentleman presented with reports of coffee-ground emesis and also reported passage of dark, tarry stools. At presentation to the Emergency Room he was hypotensive, tachycardic, hemoglobin was 6.8, acute drop from 14.7 several days previously. The patient was admitted to intensive care unit. Please refer to the history and physical for details.   HOSPITAL COURSE: The patient was transfused 2 units initially.  He continued to bleed as evidenced by an inappropriate drop in his hemoglobin; he was transfused an additional 2 units.  Gastroenterology consulted with Dr. Lynnae Prude who performed an upper GI endoscopy with report as above. He was placed on intravenous pantoprazole drip and general surgery was also consulted for possible duodenal surgery if the patient exhibited any further bleeding. His hemoglobin was stable on August 15; however, on August 16 the patient developed another acute drop in his hemoglobin and decision was made to take him to the operating room where he successfully underwent duodenal resection with omental patch of duodenotomy.   A vascular surgeon was also consulted for possible  vascular procedure alleviate this bleeding, however, this was not deemed to be necessary after his duodenal surgery. Following the surgery the patient was kept n.p.o. for several days. His postsurgical care was assumed by Dr. Natale Lay. He received intravenous fluids before he was cleared for an oral diet, however, for about 5-6 days following the surgery. The patient tolerated his oral diet which was advanced and was tolerated successfully.   The patient'Corran Lalone stay was complicated by persistent tachycardia, especially when he was on fluids alone. The patient complained of incisional pain. This was controlled by addition of digoxin in addition to his continued dose of metoprolol.  During the period that the patient was n.p.o. his medications have been converted to intravenous route.  Following his resumption of oral diet his blood pressure and pulse subsequently improved with oral medications.   The patient'Micaiah Remillard postoperative course was uncomplicated. His staples were removed on 04/25/2014 and he was discharged to home in satisfactory condition.   DISCHARGE INSTRUCTIONS: Diet:  Low sodium, low fat.  Activity: As tolerated.  Follow up with general surgery in 1-2 weeks and Dr. Ellsworth Lennox in 1-2 weeks.   DISCHARGE MEDICATIONS:  1.  Buspirone 7.5 mg b.i.d.  2.  Enalapril 20 mg b.i.d.  3.  Hydralazine 100 mg b.i.d.  4.  Pravastatin 20 mg at bedtime.  5.  Clonidine 0.3 mg b.i.d.  6.  Metoprolol 50 mg b.i.d.  7.  Hydrochlorothiazide 25 mg daily.  8.  Isosorbide mononitrate 30 mg daily.  9.  Vicodin 5/325 one tablet every 6 hours as needed for pain.  10. Prednisone 50 mg daily.  11. Promethazine  25 mg 2 tablets every 4-6 hours as needed.  12. Protonix 40 mg b.i.d.   Time spent in discharge process: 35 minutes.    ____________________________ Silas FloodSheikh A. Ellsworth Lennoxejan-Sie, MD sat:lt D: 05/08/2014 13:28:14 ET T: 05/08/2014 14:19:06 ET JOB#: [id number]  cc: Sheikh A. Ellsworth Lennoxejan-Sie, MD, <Dictator> Charlesetta GaribaldiSHEIKH A TEJAN-SIE  MD ELECTRONICALLY SIGNED 05/15/2014 14:09

## 2014-12-21 NOTE — Consult Note (Signed)
CHIEF COMPLAINT and HISTORY:  Subjective/Chief Complaint GI bleed   History of Present Illness Patient with large duodenal ulcer has had 8 u PRBC.  Initially stopped but rebled this morning.   PAST MEDICAL/SURGICAL HISTORY:  Past Medical History:   Chronic back pain:    Myocardial Infarct:    HTN:    brain aneurism:   ALLERGIES:  Allergies:  Ibuprofen: GI Distress  Penicillin: Rash  Clindamycin: Rash  HOME MEDICATIONS:  Home Medications: Medication Instructions Status  promethazine 25 mg tablet 1-2 tab(s) orally every 4-6 hours- as needed  Active  predniSONE 50 mg oral tablet 1 tab(s) orally once a day Active  acetaminophen-HYDROcodone 325 mg-5 mg oral tablet 1 tab(s) orally every 6 hours, As Needed - for Pain Active  pravastatin 20 mg oral tablet 1 tab(s) orally once a day (at bedtime) Active  cloNIDine 0.3 mg oral tablet 1 tab(s) orally 2 times a day Active  meloxicam 15 mg oral tablet 1 tab(s) orally once a day Active  Metoprolol Tartrate 50 mg oral tablet 1 tab(s) orally 2 times a day Active  busPIRone 7.5 mg oral tablet 1 tab(s) orally 2 times a day Active  enalapril 20 mg oral tablet 1 tab(s) orally 2 times a day Active  hydrALAZINE 100 mg oral tablet 1 tab(s) orally 2 times a day Active  hydrochlorothiazide 12.5 mg oral capsule 1 cap(s) orally once a day (in the morning) Active  hydrochlorothiazide 25 mg oral tablet 1 tab(s) orally once a day (at bedtime) Active  isosorbide mononitrate 30 mg oral tablet, extended release 1 tab(s) orally once a day Active   Family and Social History:  Family History Non-Contributory   Social History negative tobacco, negative ETOH, negative Illicit drugs   Place of Living Home   Review of Systems:  Fever/Chills No   Cough No   Sputum No   Abdominal Pain Yes   Diarrhea Yes   Constipation No   Nausea/Vomiting Yes   SOB/DOE Yes   Chest Pain No   Telemetry Reviewed NSR   Dysuria No   Tolerating PT No    Tolerating Diet No   Medications/Allergies Reviewed Medications/Allergies reviewed   Physical Exam:  GEN critically ill appearing   HEENT pink conjunctivae, moist oral mucosa   NECK No masses  trachea midline   RESP postive use of accessory muscles  crackles   CARD no JVD  tachy, sinus rhythm   VASCULAR ACCESS non e   ABD positive tenderness  midline dressing in place   GU foley catheter in place  clear yellow urine draining   LYMPH negative neck, negative axillae   EXTR negative cyanosis/clubbing, negative edema   SKIN normal to palpation, skin turgor good   NEURO cranial nerves intact, motor/sensory function intact   PSYCH alert, A+O to time, place, person   LABS:  Laboratory Results: Hepatic:    14-Aug-15 14:14, Comprehensive Metabolic Panel  Bilirubin, Total 0.3  Alkaline Phosphatase 40  46-116  NOTE: New Reference Range  03/19/14  SGPT (ALT) 28  14-63  NOTE: New Reference Range  03/19/14  SGOT (AST) 9  Total Protein, Serum 5.8  Albumin, Serum 2.8  Routine BB:    14-Aug-15 14:14, Crossmatch 4 Units  Crossmatch Unit 1 Issued  Crossmatch Unit 2 Issued  Crossmatch Unit 3   Cancelled  Crossmatch Unit 4   Cancelled   Result(s) reported on 14 Apr 2014 at 12:37PM.  Crossmatch Unit 1 Issued  Crossmatch Unit 2 Ready  [employer]  Unit 3 Issued  Crossmatch Unit 4 Ready  Result(s) reported on 14 Apr 2014 at 09:14AM.    14-Aug-15 14:14, Type and Antibody Screen  ABO Group + Rh Type   A Positive  Antibody Screen NEGATIVE  Result(s) reported on 12 Apr 2014 at 03:04PM.    14-Aug-15 15:13, Crossmatch 4 Units  Crossmatch Unit 1   Transfused  Crossmatch Unit 2   Transfused  Crossmatch Unit 3   Transfused  Crossmatch Unit 4   Transfused   Result(s) reported on 14 Apr 2014 at 03:43AM.  Routine Chem:    14-Aug-15 14:14, Comprehensive Metabolic Panel  Glucose, Serum 168  BUN 50  Creatinine (comp) 1.56  Sodium, Serum 137  Potassium, Serum 4.3  Chloride,  Serum 105  CO2, Serum 25  Calcium (Total), Serum 7.8  Osmolality (calc) 291  eGFR (African American) 56  eGFR (Non-African American) 48  eGFR values <20m/min/1.73 m2 may be an indication of chronic  kidney disease (CKD).  Calculated eGFR is useful in patients with stable renal function.  The eGFR calculation will not be reliable in acutely ill patients  when serum creatinine is changing rapidly. It is not useful in   patients on dialysis. The eGFR calculation may not be applicable  to patients at the low and high extremes of body sizes, pregnant  women, and vegetarians.  Anion Gap 7    14-Aug-15 14:14, Crossmatch 4 Units  Result Comment   XMATCH - 2 RBCS PREVIOUSLY ON HOLD. 2 MORE   - XMATCHED FOR A TOTAL OF 4 RBCS CURRENTLY   - AVAILABLE.    14-Aug-15 14:14, Lipase  Lipase 110  Result(s) reported on 12 Apr 2014 at 02:37PM.    14-Aug-15 15:13, Crossmatch 4 Units  Result Comment   XMATCH - 2 RBCS EMERGENCY RELEASED    14-Aug-15 23:19, Hemoglobin  Result Comment   HGB - CANCELLED PER RN LIBBY   Result(s) reported on 12 Apr 2014 at 11:35PM.    15-Aug-15 002:58 Basic Metabolic Panel (w/Total Calcium)  Glucose, Serum 93  BUN 40  Creatinine (comp) 1.18  Sodium, Serum 141  Potassium, Serum 4.3  Chloride, Serum 109  CO2, Serum 25  Calcium (Total), Serum 7.1  Anion Gap 7  Osmolality (calc) 291  eGFR (African American) >60  eGFR (Non-African American) >60  eGFR values <637mmin/1.73 m2 may be an indication of chronic  kidney disease (CKD).  Calculated eGFR is useful in patients with stable renal function.  The eGFR calculation will not be reliable in acutely ill patients  when serum creatinine is changing rapidly. It is not useful in   patients on dialysis. The eGFR calculation may not be applicable  to patients at the low and high extremes of body sizes, pregnant  women, and vegetarians.    15-Aug-15 04:28, CBC Profile  Result Comment   LABS - This specimen was collected  through an    - indwelling catheter or arterial line.   - A minimum of 51m26mof blood was wasted prior     - to collecting the sample.  Interpret   - results with caution.   Result(s) reported on 13 Apr 2014 at 04:38AM.    15-Aug-15 04:28, Magnesium, Serum  Magnesium, Serum 1.8  1.8-2.4  THERAPEUTIC RANGE: 4-7 mg/dL  TOXIC: > 10 mg/dL   -----------------------    16-Aug-15 04:52:77asic Metabolic Panel (w/Total Calcium)  Glucose, Serum 85  BUN 29  Creatinine (comp) 1.06  Sodium, Serum 142  Potassium, Serum 4.0  Chloride, Serum 111  CO2, Serum 25  Calcium (Total), Serum 6.8  Anion Gap 6  Osmolality (calc) 288  eGFR (African American) >60  eGFR (Non-African American) >60  eGFR values <79m/min/1.73 m2 may be an indication of chronic  kidney disease (CKD).  Calculated eGFR is useful in patients with stable renal function.  The eGFR calculation will not be reliable in acutely ill patients  when serum creatinine is changing rapidly. It is not useful in   patients on dialysis. The eGFR calculation may not be applicable  to patients at the low and high extremes of body sizes, pregnant  women, and vegetarians.  Result Comment   CALCIUM - RESULTS VERIFIED BY REPEAT TESTING.   - NOTIFIED OF CRITICAL VALUE   - C/BETH BUONO AT 0430 04/14/14.PMH   - READ-BACK PROCESS PERFORMED.   Result(s) reported on 14 Apr 2014 at 04:29AM.    16-Aug-15 04:06, CBC Profile  Result Comment   LABS - This specimen was collected through an    - indwelling catheter or arterial line.   - A minimum of 558m of blood was wasted prior     - to collecting the sample.  Interpret   - results with caution.   Result(s) reported on 14 Apr 2014 at 04:29AM.    16-Aug-15 10:44, Hemoglobin  Result Comment   LABS - This specimen was collected through an    - indwelling catheter or arterial line.   - A minimum of 58m33mof blood was wasted prior     - to collecting the sample.  Interpret   - results with caution.    Result(s) reported on 14 Apr 2014 at 10:54AM.  Routine UA:    15-Aug-15 04:16, Urinalysis  Color (UA) Straw  Clarity (UA) Clear  Glucose (UA) Negative  Bilirubin (UA) Negative  Ketones (UA) Negative  Specific Gravity (UA) 1.012  Blood (UA) 2+  pH (UA) 5.0  Protein (UA) Negative  Nitrite (UA) Negative  Leukocyte Esterase (UA) Negative  Result(s) reported on 13 Apr 2014 at 04:28AM.  RBC (UA) 6 /HPF  WBC (UA) 1 /HPF  Bacteria (UA)   NONE SEEN  Epithelial Cells (UA)   NONE SEEN  Mucous (UA) PRESENT  Result(s) reported on 13 Apr 2014 at 04:28AM.  Routine Coag:    14-Aug-15 14:14, Activated PTT  Activated PTT (APTT) < 23.0  A HCT value >55% may artifactually increase the APTT. In one study,  the increase was an average of 19%.  Reference: "Effect on Routine and Special Coagulation Testing Values  of Citrate Anticoagulant Adjustment in Patients with High HCT Values."  American Journal of Clinical Pathology 2006;126:400-405.    14-Aug-15 14:14, Prothrombin Time  Prothrombin 13.7  INR 1.1  INR reference interval applies to patients on anticoagulant therapy.  A single INR therapeutic range for coumarins is not optimal for all  indications; however, the suggested range for most indications is  2.0 - 3.0.  Exceptions to the INR Reference Range may include: Prosthetic heart  valves, acute myocardial infarction, prevention of myocardial  infarction, and combinations of aspirin and anticoagulant. The need  for a higher or lower target INR must be assessed individually.  Reference: The Pharmacology and Management of the Vitamin K   antagonists: the seventh ACCP Conference on Antithrombotic and  Thrombolytic Therapy. [id number]:126 (3suppl): [id number] HCT value >55% may artifactually increase the PT.  In one study,   the increase was an average of 25%.  Reference:  "Effect  on Routine and Special Coagulation Testing Values  of Citrate Anticoagulant Adjustment in Patients  with High HCT Values."  American Journal of Clinical Pathology 2006;126:400-405.  Routine Hem:    14-Aug-15 14:14, Hemogram, Platelet Count  WBC (CBC) 11.1  RBC (CBC) 2.28  Hemoglobin (CBC) 6.8  Hematocrit (CBC) 21.2  Platelet Count (CBC) 230  Result(s) reported on 12 Apr 2014 at 02:34PM.  MCV 93  MCH 29.7  MCHC 32.0  RDW 14.3    14-Aug-15 23:19, Hematocrit  Hematocrit (CBC) 22.9  Result(s) reported on 12 Apr 2014 at 11:37PM.    14-Aug-15 23:19, Hemoglobin  Hemoglobin (CBC) 7.7  Result(s) reported on 12 Apr 2014 at 11:37PM.  Hemoglobin (CBC) -    15-Aug-15 04:28, CBC Profile  WBC (CBC) 9.0  RBC (CBC) 2.33  Hemoglobin (CBC) 7.1  Hematocrit (CBC) 21.5  Platelet Count (CBC) 145  MCV 92  MCH 30.6  MCHC 33.3  RDW 13.9  Neutrophil % 69.7  Lymphocyte % 19.3  Monocyte % 8.5  Eosinophil % 2.1  Basophil % 0.4  Neutrophil # 6.2  Lymphocyte # 1.7  Monocyte # 0.8  Eosinophil # 0.2  Basophil # 0.0    15-Aug-15 11:30, CBC Profile  WBC (CBC) 9.5  RBC (CBC) 2.79  Hemoglobin (CBC) 8.4  Hematocrit (CBC) 25.2  Platelet Count (CBC) 132  MCV 90  MCH 30.0  MCHC 33.2  RDW 14.8  Neutrophil % 80.5  Lymphocyte % 11.3  Monocyte % 6.8  Eosinophil % 1.1  Basophil % 0.3  Neutrophil # 7.7  Lymphocyte # 1.1  Monocyte # 0.6  Eosinophil # 0.1  Basophil # 0.0  Result(s) reported on 13 Apr 2014 at 11:58AM.    16-Aug-15 04:06, CBC Profile  WBC (CBC) 6.5  RBC (CBC) 2.23  Hemoglobin (CBC) 6.8  Hematocrit (CBC) 20.0  Platelet Count (CBC) 124  MCV 90  MCH 30.5  MCHC 34.0  RDW 15.4  Neutrophil % 69.1  Lymphocyte % 19.6  Monocyte % 7.8  Eosinophil % 3.0  Basophil % 0.5  Neutrophil # 4.5  Lymphocyte # 1.3  Monocyte # 0.5  Eosinophil # 0.2  Basophil # 0.0    16-Aug-15 10:44, Hematocrit  Hematocrit (CBC) 26.4  Result(s) reported on 14 Apr 2014 at 10:54AM.    16-Aug-15 10:44, Hemoglobin  Hemoglobin (CBC) 8.7   RADIOLOGY:  Radiology Results: XRay:    25-Oct-14 02:15, Chest  Portable Single View  Chest Portable Single View  REASON FOR EXAM:    HTN  COMMENTS:       PROCEDURE: DXR - DXR PORTABLE CHEST SINGLE VIEW  - Jun 23 2013  2:15AM     RESULT: The lungs are clear. The cardiac silhouette and visualized bony   skeleton are unremarkable.    IMPRESSION:      1. Chest radiograph without evidence of acute cardiopulmonary disease.   2. Comparison 03/16/2013.        Verified By: Mikki Santee, M.D., MD    25-Oct-14 14:15, Lumbar Spine AP and Lateral  Lumbar Spine AP and Lateral  REASON FOR EXAM:    s/p fall and lower back pain.  COMMENTS:       PROCEDURE: DXR - DXR LUMBAR SPINE AP AND LATERAL  - Jun 23 2013  2:15PM     RESULT: Five non-rib bearing lumbar vertebral bodies are appreciated.   There is no evidence of fracture, dislocation or malalignment.   Subchondral sclerosis, endplate hypertrophic spurring and disc base   narrowing is  identified within the lower lumbar spine.    IMPRESSION:     1. No acute osseous abnormalities.   2. If there is persistent clinical concern or persistent complaints of   pain, further evaluation with MRI is recommended.  3. Degenerative disease changes within the lower lumbar spine        Verified By: Mikki Santee, M.D., MD    31-Oct-14 23:20, Chest PA and Lateral  Chest PA and Lateral  REASON FOR EXAM:    Chest Pain  COMMENTS:       PROCEDURE: DXR - DXR CHEST PA (OR AP) AND LATERAL  - Jun 29 2013 11:20PM     RESULT: Comparison is made to the study of 06/23/2013.    The lungs are clear. The heart and pulmonary vessels are normal. The bony   and mediastinal structures are unremarkable. There is no effusion. There   is no pneumothorax or evidence of congestive failure.    IMPRESSION:  No acute cardiopulmonary disease. Stable appearance.    Dictation Site: 6    Verified By: Sundra Aland, M.D., MD    12-May-15 10:08, Chest Portable Single View  Chest Portable Single View  REASON FOR EXAM:     Chest Pain  COMMENTS:       PROCEDURE: DXR - DXR PORTABLE CHEST SINGLE VIEW  - Jan 08 2014 10:08AM     CLINICAL DATA:  Chest pain    EXAM:  PORTABLE CHEST - 1 VIEW    COMPARISON:  June 29, 2013    FINDINGS:  There is no edema or consolidation. Heart size and pulmonary  vascularity are normal. No adenopathy. Aorta is somewhat tortuous  but stable. No bone lesions.     IMPRESSION:  No edema or consolidation.      Electronically Signed    By: Lowella Grip M.D.    On: 01/08/2014 10:14         Verified By: Leafy Kindle. WOODRUFF, M.D.,    14-May-15 00:09, Chest Portable Single View  Chest Portable Single View  REASON FOR EXAM:    chest pain  COMMENTS:       PROCEDURE: DXR - DXR PORTABLE CHEST SINGLE VIEW  - Jan 10 2014 12:09AM     CLINICAL DATA:  Chest pain after an argument. Elevated blood  pressure.    EXAM:  PORTABLE CHEST - 1 VIEW    COMPARISON:  DG CHEST 1V PORT dated 01/08/2014    FINDINGS:  The heart size and mediastinal contours are within normal limits.  Both lungs are clear. The visualized skeletal structures are  unremarkable.     IMPRESSION:  No active disease.      Electronically Signed    By: Lucienne Capers M.D.    On: 01/10/2014 00:17         Verified By: Neale Burly, M.D.,    13-Jun-15 13:39, Abdomen 3 Way Includes PA Chest  Abdomen 3 Way Includes PA Chest  REASON FOR EXAM:    cough, abdominal pain  COMMENTS:       PROCEDURE: DXR - DXR ABDOMEN 3-WAY (INCL PA CXR)  - Feb 09 2014  1:39PM     CLINICAL DATA:  Pain.    EXAM:  ABDOMEN SERIES    COMPARISON:  CT 01/10/2014.  Ultrasound 01/08/2014.    FINDINGS:  Chest x-ray reveals no acute cardiopulmonary disease. Soft tissue  structures of the abdomen are unremarkable. Several slightly  distended air-filled loops of small bowel noted  with mild mucosal  thickening. Process such as enteritis cannot be excluded. Stool is  noted throughout the colon. No colonic distention  noted. No free  air. Degenerative changes lumbar spine and both hips. Pelvic  phleboliths.     IMPRESSION:  Several slightly distended air-filled loops of small bowel with mild  small bowel wall thickening noted. A process such as enteritis could  present in this fashion. Stool is noted colon. No free air.      Electronically Signed    By: Marcello Moores  Register    On: 02/09/2014 14:14     Verified By: Osa Craver, M.D., MD    03-Aug-15 19:19, Abdomen Flat and Erect  Abdomen Flat and Erect  REASON FOR EXAM:    vomiting  COMMENTS:       PROCEDURE: DXR - DXR ABDOMEN 2 V FLAT AND ERECT  - Apr 01 2014  7:19PM     CLINICAL DATA:  Nausea and vomiting    EXAM:  ABDOMEN - 2 VIEW    COMPARISON:  February 09, 2014    FINDINGS:  Supine and upright abdomen images were obtained. There is stool  throughout the colon diffusely. Bowel gas pattern is unremarkable.  No obstruction or free air. Small calcifications in the pelvis are  consistent with phleboliths.     IMPRESSION:  No obstruction or free air.  Diffuse stool throughout the colon.      Electronically Signed    By: Lowella Grip M.D.    On: 04/01/2014 19:33         Verified By: Leafy Kindle. WOODRUFF, M.D.,    14-Aug-15 23:07, Chest Portable Single View  Chest Portable Single View  REASON FOR EXAM:    check central line placement  COMMENTS:       PROCEDURE: DXR - DXR PORTABLE CHEST SINGLE VIEW  - Apr 12 2014 11:07PM     CLINICAL DATA:  PICC line placement.    EXAM:  PORTABLE CHEST - 1 VIEW    COMPARISON:  01/09/2014    FINDINGS:  Left PICC line is in place with the tip at the cavoatrial junction.  Low lung volumes without confluent opacity. Heart is normal size. No  effusions or acute bony abnormality.     IMPRESSION:  Left PICC line tip at the cavoatrial junction.    Low lung volumes.      Electronically Signed    By: Rolm Baptise M.D.    On: 04/12/2014 23:22         Verified By: Raelyn Number,  M.D.,  [place]:    12-May-15 12:51, US Abdomen Limited Survey  US Abdomen Limited Survey  REASON FOR EXAM:    epigastric pain, nausea  COMMENTS:   Body Site: Gallbladder, Liver, Common Bile Duct    PROCEDURE: [place]  - US ABDOMEN LIMITED SURVEY  - Jan 08 2014 12:51PM     CLINICAL DATA:  Epigastric pain and nausea    EXAM:  US ABDOMEN LIMITED - RIGHT UPPER QUADRANT    COMPARISON:  None.    FINDINGS:  Gallbladder:  The gallbladder is adequately distended with no evidence of stones,  wall thickening, or pericholecystic fluid. There is no positive  sonographic Murphy's sign.    Common bile duct:    Diameter: 5.1 mm.    Liver:    The liver exhibits normal echotexture. There is no focal mass nor  ductal dilation. Portal venous flow is normal in direction toward  the liver.  IMPRESSION:  Normal limited right upper quadrant ultrasound examination.      Electronically Signed    By: David  [place]    On: 01/08/2014 12:56         Verified By: DAVID A. [place], M.D., MD  LabUnknown:    25-Oct-14 02:15, Chest Portable Single View  PACS Image    25-Oct-14 02:18, CT Head Without Contrast  PACS Image    25-Oct-14 14:15, Lumbar Spine AP and Lateral  PACS Image    31-Oct-14 23:20, Chest PA and Lateral  PACS Image    12-May-15 10:08, Chest Portable Single View  PACS Image    12-May-15 12:51, US Abdomen Limited Survey  PACS Image    14-May-15 00:09, Chest Portable Single View  PACS Image    14-May-15 04:08, CT Abdomen and Pelvis With Contrast  PACS Image    13-Jun-15 13:39, Abdomen 3 Way Includes PA Chest  PACS Image    03-Aug-15 19:19, Abdomen Flat and Erect  PACS Image    14-Aug-15 23:07, Chest Portable Single View  PACS Image  CT:    25-Oct-14 02:18, CT Head Without Contrast  CT Head Without Contrast  REASON FOR EXAM:    HTN, SLURRED SPEECH  COMMENTS:       PROCEDURE: CT  - CT HEAD WITHOUT CONTRAST  - Jun 23 2013  2:18AM     RESULT: Technique: Helical noncontrasted 5  mm sections were obtained from   the skull base through the vertex.    Findings: Diffuse cortical and cerebellar atrophy is identified as well   as diffuse areas of low attenuation within the subcortical, deep and   periventricular white matter regions. There is not evidence of   intra-axial nor extra-axial fluid collections, acute hemorrhage, mass   effect, nor a depressed skull fracture. The visualized paranasal sinuses   and mastoid air cells are patent.  IMPRESSION:  Chronic and involutional changes without evidence of acute   abnormalities. If there is persistent clinicalconcern further evaluation   with MRI is recommended.   2. Dr. Beather Arbour of the emergency department was informed of these findings   via a preliminary faxed report.          Thank you for the opportunity to contribute to the care of your patient.        Verified By: Mikki Santee, M.D., MD    14-May-15 04:08, CT Abdomen and Pelvis With Contrast  CT Abdomen and Pelvis With Contrast  REASON FOR EXAM:    (1) UPPER ABDOMINAL PAIN; (2) N/V/D;    NOTE: Nursing   to Give Oral CT Contrast  COMMENTS:   May transport without cardiac monitor    PROCEDURE: CT  - CT ABDOMEN / PELVIS  W  - Jan 10 2014  4:08AM     CLINICAL DATA:  Chest pain after in argument. Upper abdominal pain.  Nausea, vomiting, and diarrhea.    EXAM:  CT ABDOMEN AND PELVIS WITH CONTRAST    TECHNIQUE:  Multidetector CT imaging of the abdomen and pelvis was performed  using the standard protocol following bolus administration of  intravenous contrast.    CONTRAST:  100 mL Isovue 300    COMPARISON:  US ABDOMEN LIMITED RUQ/ASCITES dated 01/08/2014    FINDINGS:  The lung bases are clear.    The liver, spleen, gallbladder, pancreas, adrenal glands, abdominal  aorta, inferior vena cava, and retroperitoneal lymph nodes are  unremarkable. Subcentimeter cysts in the upper pole of the left  kidney.  No hydronephrosis in the kidneys. Bold stomach,  small bowel,  and colon are unremarkable. No free air or free fluid in the  abdomen.  Pelvis: The appendix is normal. Bladder wall is not thickened.  Prostate gland is borderline enlarged, measuring 3.7 x 3.9 cm. No  mass or lymphadenopathy in the pelvis. View No diverticulitis.  Degenerative changes in the lumbar spine. No destructive bone  lesions.     IMPRESSION:  No acute process demonstrated in the abdomen or pelvis.      Electronically Signed    By: Lucienne Capers M.D.    On: 01/10/2014 04:14       Verified By: Neale Burly, M.D.,   ASSESSMENT AND PLAN:  Assessment/Admission Diagnosis recurrent, large volume GI bleed from duodenal ulcer   Plan Discussed case with Dr. Rexene Edison.  With acive bleeding and hypotension, was best served with surgery which he recently completed.  Embolization was not a great option for brisk upper GI bleeding with hypotension.  Embolization could be used if he has any further bleeding, but would expect the GDA to now be occluded with surgery, and the risk of rebleeding will be pretty low.  Please call with questions.      level 2   Electronic Signatures: Algernon Huxley (MD)  (Signed 16-Aug-15 15:17)  Authored: Chief Complaint and History, PAST MEDICAL/SURGICAL HISTORY, ALLERGIES, HOME MEDICATIONS, Family and Social History, Review of Systems, Physical Exam, LABS, RADIOLOGY, Assessment and Plan   Last Updated: 16-Aug-15 15:17 by Algernon Huxley (MD)

## 2014-12-21 NOTE — Consult Note (Signed)
Pulse down, color of tongue and conjunctiva better after 2 units this morning.  Discussed with Dr. Juliann PulseLundquist.  Electronic Signatures: [name], Robert T (MD)  (Signed on 16-Aug-15 10:52)  Authored  Last Updated: 16-Aug-15 10:52 by [name], Robert T (MD)

## 2014-12-21 NOTE — Consult Note (Signed)
Brief Consult Note: Diagnosis: infected sebaceous cyst.   Patient was seen by consultant.   Recommend further assessment or treatment.   Orders entered.   Comments: no need for further I and D, wound care orders written f/u in our office 10-14 days.  Electronic Signatures: Natale LayBird, Nakyra Bourn (MD)  (Signed 14-Jun-15 12:36)  Authored: Brief Consult Note   Last Updated: 14-Jun-15 12:36 by Natale LayBird, Nina Hoar (MD)

## 2014-12-21 NOTE — Consult Note (Signed)
PATIENT NAME:  Howard Weaver, Howard Weaver MR#:  [id number] DATE OF BIRTH:  [birth year]  DATE OF CONSULTATION:  04/12/2014  REFERRING PHYSICIAN:   CONSULTING PHYSICIAN:  Scot Junobert T. Rocsi Hazelbaker, MD  HISTORY OF PRESENT ILLNESS: The patient is a [age] year old male who came to the ER with a history of a coffee-ground emesis, several episodes today. He had a similar problem a couple of weeks ago, having dark-colored stools with occasional reddish blood in it attributed to a fissure. He was hypotensive in the ER. Pressure dropped into the 60s. He was given IV fluids and blood on an urgent basis. His hemoglobin was 6.8 where it was 14.7 two weeks ago. I was asked to see him in consultation.   The patient has never had an upper or colonoscopy.   PAST MEDICAL HISTORY: Hypertension, brain aneurysm status post surgery and clipping, hyperlipidemia.   ALLERGIES: PENICILLIN AND IBUPROFEN.   MEDICATIONS: BuSpar 7.5 mg b.i.d., enalapril 20 mg b.i.d., hydralazine 100 mg b.i.d., pravastatin 20 mg at bedtime, clonidine 0.3 mg b.i.d., meloxicam 15 mg daily, metoprolol 50 mg b.i.d., hydrochlorothiazide 12.5 mg daily, and Imdur 30 mg a day.   SOCIAL HISTORY: Does not smoke or drink. Lives with his son.   FAMILY HISTORY: Positive for heart disease.   REVIEW OF SYSTEMS: No fever. No change in vision. No asthma or wheezing. No chest pains. Nausea and coffee-ground emesis, dark-colored stools,  recent constipation. No use of any other nonsteroidal anti-inflammatory drugs except meloxicam.   PHYSICAL EXAMINATION: GENERAL: Pale white male lying on a stretcher in no acute distress.  VITAL SIGNS: Blood pressure now is up to 110/60, pulse is 74, respirations 16.  HEENT: Sclerae anicteric. Conjunctivae pale. Head is pale. Tongue is a little pale.  CHEST: Clear in the anterior fields.  HEART: No murmurs or gallops I could hear.  ABDOMEN: Not nondistended, nontender. Bowel sounds are present. No hepatosplenomegaly.  EXTREMITIES: No edema.   SKIN: Warm and dry.  PSYCHIATRIC: Mood and affect are appropriate.   LABORATORY DATA: Hemoglobin 6.8, white count 11, CO2 of 25, chloride 105, potassium 4.3, sodium 137, BUN 50, creatinine 1.56, glucose 168.   ASSESSMENT: Upper gastrointestinal bleed, probably due to Mobic nonsteroidal anti-inflammatory drug.   RECOMMENDATIONS: Agree with hospital admission, transfuse 2 more units of blood, try to do an upper endoscopy today given the severity of his bleeding, get surgical consultation.     ____________________________ Scot Junobert T. Phyllistine Domingos, MD rte:at D: 04/12/2014 17:59:37 ET T: 04/12/2014 18:17:52 ET JOB#: [id number]  cc: Scot Junobert T. Tarus Briski, MD, <Dictator> Laurier NancyShaukat A. Khan, MD Starleen Armsawood S. Elgergawy, MD Si Raiderhristopher A. Juliann PulseLundquist, MD  Scot JunOBERT T Charlea Nardo MD ELECTRONICALLY SIGNED 04/28/2014 12:51

## 2014-12-21 NOTE — H&P (Signed)
PATIENT NAME:  Howard Weaver, PROSSER MR#:  [id number] DATE OF BIRTH:  [birth year]  DATE OF ADMISSION:  05/01/2014   REFERRING PHYSICIAN: Enedina Finner. Manson Passey, MD  PRIMARY CARE PHYSICIAN:  Margaretann Loveless, MD  ADMIT DIAGNOSIS: Pneumonia and chest pain.   HISTORY OF PRESENT ILLNESS: This is a 59 year old Caucasian male who presents to the Emergency Department complaining of chest pain and shortness of breath. The patient states that the pain began today and was severe in the middle of his chest. He states that it did not radiate. It was associated with shortness of breath; however, the latter began without chest pain a number of days before. His son states that he his father would become short of breath even at rest and that he could visibly be seen breathing more rapidly than usual. The patient was discharged 5 days ago following an emergency surgery to correct a bleeding ulcer. He has not been very mobile since then. He denies any pain in his legs or pain with deep inspiration.   The patient's initial cardiac enzymes were negative and he did not have any findings consistent with pulmonary embolism. Due to his persistent tachypnea and hypoxemia, the Emergency Department staff called for admission.   REVIEW OF SYSTEMS:  CONSTITUTIONAL: The patient denies fever or weakness.  EYES: The patient denies double vision or inflammation.  EARS, NOSE, AND THROAT: The patient denies tinnitus or difficulty swallowing.  RESPIRATORY: The patient admits to shortness of breath, but denies cough.  CARDIOVASCULAR: The patient admits to chest pain and orthopnea, but denies paroxysmal nocturnal dyspnea or palpitations.  GASTROINTESTINAL: The patient denies nausea, vomiting or diarrhea. He admits to abdominal pain around the incision.  GENITOURINARY: The patient denies dysuria or increased frequency or hesitancy.  ENDOCRINE: The patient denies polyuria or nocturia.  HEMATOLOGIC AND LYMPHATIC: The patient denies easy bruising  or bleeding.  INTEGUMENTARY: The patient denies rashes or lesions.  MUSCULOSKELETAL: The patient denies myalgias or arthralgias.  NEUROLOGIC: The patient denies numbness or dysarthria.  PSYCHIATRIC: The patient denies depression or suicidal ideation.   PAST MEDICAL HISTORY: Significant for: Coronary artery disease, hypertension, peptic ulcer disease and a brain aneurysm.   PAST SURGICAL HISTORY: Peptic ulcer disease repair, aneurysm clipping.    FAMILY HISTORY: Diabetes in the patient's siblings.   SOCIAL HISTORY: The patient does not smoke, drink or do any drugs. He admits to a history of social drinking, however.   MEDICATIONS:  1.  Acetaminophen with oxycodone 325 mg/5 mg, 1 tablet p.o. every 6 hours as needed for pain.  2.  Clonidine 0.3 mg 1 tablet p.o. b.i.d.  3.  Isosorbide mononitrate 30 mg extended release 1 tablet p.o. daily.  4.  Promethazine 25 mg 1 to 2 tablets orally every 4 to 6 hours as needed for nausea or vomiting.   5.  Metoprolol 100 mg extended release 1 tablet p.o. daily.  6.  Hydrochlorothiazide 25 mg 1 tablet p.o. daily.  7.  Hydralazine 100 mg 1 tablet p.o. b.i.d.   ALLERGIES: CLINDAMYCIN, IBUPROFEN AND PENICILLIN.   PERTINENT LABORATORY FINDINGS AND RADIOGRAPHIC RESULTS: First set of troponins was negative. BUN was 23, creatinine was 1.8. White blood cell count was 4.8, hemoglobin 10.1, and hematocrit 30.1. Chest x-ray shows some patchy atelectasis or interstitial infiltrates in the lung bases that are improved from the previous exam; there is a tortuous thoracic aorta. CT angiography of the chest is negative for pulmonary embolism; mild lower lobe  peribronchial thickening is evident to suggest  chronic bronchitis and the lung opacities at the bases are visualized, as stated before.   PHYSICAL EXAMINATION:  VITAL SIGNS: Temperature is 97.5, pulse 62, respirations 21, blood pressure 123/88, oxygen 98% on 2 L of oxygen by nasal cannula.  GENERAL: Alert and  oriented x3, in no apparent distress.  HEENT: Normocephalic, atraumatic. Extraocular movements are intact. Pupils are equal, round and reactive to light and accommodation. Mucous membranes are some tacky.  NECK: Trachea is midline. No adenopathy.  CHEST: Symmetric and atraumatic.  CARDIOVASCULAR: Regular rate and rhythm. Normal S1, S2. No rubs, clicks, or murmurs.  LUNGS: Clear to auscultation bilaterally. Normal excursion, but the patient is tachypneic at rest.  ABDOMEN: Positive bowel sounds, soft, nondistended. No hepatosplenomegaly. The patient is tender around a well healing midline incision. There is some scant amount of dried pus at the very base of the incision.  GENITOURINARY: Deferred.  MUSCULOSKELETAL: The patient moves all 4 extremities equally.  SKIN: No rashes or lesions.  EXTREMITIES: No clubbing, cyanosis, or edema.  NEUROLOGIC: Cranial nerves II-XII grossly intact.  PSYCHIATRIC: Mood is normal and affect is congruent.   ASSESSMENT AND PLAN: This is a 59 year old gentleman with possible pneumonia as well as chest pain.  1.  Pneumonia. Unclear if these patchy areas seen on chest x-ray represent consolidation or simple atelectasis following surgery. The patient is afebrile, has no leukocytosis. He has received a dose of Levaquin in the Emergency Department, which I will continue, in addition to azithromycin to cover any atypical organisms. If he becomes sicker, he technically needs to be treated for healthcare associated pneumonia as he had been in the hospital recently.  2.  Chest pain, atypical, as it has lasted for so long. Required 2 doses of morphine to relieve the pain, but the patient is comfortable now. It is most likely his pain is secondary to muscular strain since he has been reportedly tachypneic for a few days. We will continue to rule out the patient with multiple sets of cardiac enzymes. At this time he does not have any EKG changes to suggest ischemia.  3.  Coronary  artery disease, hopefully stable. Continue metoprolol, hydrochlorothiazide and Imdur, as well as hydralazine and clonidine.  4.  Deep vein thrombosis prophylaxis with SCDs.  5.  Gastrointestinal prophylaxis. We will do pantoprazole.   The patient is a Full Code.   TIME SPENT ON ADMISSION ORDERS AND PATIENT CARE: Approximately 35 minutes.    ____________________________ Kelton PillarMichael S. Sheryle Hailiamond, MD msd:MT D: 05/01/2014 06:59:00 ET T: 05/01/2014 07:22:13 ET JOB#: [id number]  cc: Kelton PillarMichael S. Sheryle Hailiamond, MD, <Dictator> Kelton PillarMICHAEL S Austen Oyster MD ELECTRONICALLY SIGNED 05/02/2014 2:04

## 2014-12-21 NOTE — Consult Note (Signed)
PATIENT NAME:  Howard Weaver, Howard Weaver MR#:  [id number] DATE OF BIRTH:  07-23-1956  DATE OF CONSULTATION:  04/17/2014  REFERRING PHYSICIAN:   CONSULTING PHYSICIAN:  Alinda SierrasEileen A. Jarold Mottoomano, PA-C  DICTATING CARDIOLOGY CONSULT FOR: Laurier NancyShaukat A. Khan, M.D.   INDICATION FOR CONSULTATION: Tachycardia.   HISTORY OF PRESENT ILLNESS:  This is a 59 year-old, white male, who presented to the ER on Friday with hypotension and acute GI bleed. He is status post duodenectomy with no complications. Yesterday, as he was sleeping, pulse elevated to 160 beats per minute, thus cardiology was consulted. This morning, the patient complains of 10 out of 10 diffuse pain in the abdomen, and shortness of breath. He complains of palpitations, but denies any chest pain.   PAST MEDICAL HISTORY: Pertinent for mild coronary artery disease uncontrolled hypertension, hemorrhagic CV[place]   FAMILY HISTORY: Pertinent for coronary artery disease in his father.   SOCIAL HISTORY: He denies tobacco or EtOH abuse.  OUTPATIENT MEDICATIONS: Include 81 mg aspirin, hydrochlorothiazide 12.5 mg, clonidine 0.3 mg b.i.d., Lotrel 5/40 mg once a day, metoprolol 100 mg b.i.d., isosorbide 30 mg daily, pantoprazole 40 mg daily.   ALLERGIES: PENICILLIN.  REVIEW OF SYSTEMS:  GENERAL: The patient is in 10 out of 10 pain, agitated, barely able to speak secondary to pain.  CARDIAC: Denies chest pain, but admits to palpitations.  RESPIRATORY: Admits to shortness of breath secondary to pain.  GASTROINTESTINAL: Diffuse abdominal pain.   PHYSICAL EXAMINATION: VITAL SIGNS: Include a temperature of 98, pulse of 138, respirations 26, blood pressure 156/100, pulse oximetry 97% saturation on room air.  GENERAL: Alert and oriented x 3, in acute distress.  NECK: No jugular venous distention or carotid bruits.  CARDIAC: Sinus tachycardia. No audible murmurs.  PULMONARY: Wheezes present bilaterally, using accessory muscles.   ABDOMEN: Diffuse tenderness.  EXTREMITIES: No pedal edema present.   PERTINENT LABORATORY RESULTS: BUN 11, creatinine of 1.19.  EKG shows sinus tachycardia 143 beats per minute. No acute changes.   ASSESSMENT AND PLAN: Tachycardia likely secondary to pain. The patient had a work-up earlier this year with no significant coronary artery disease, normal left ventricular systolic function. Change metoprolol to labetalol 20 mg IV q.6 h., hold for heart rate less than 70 and systolic blood pressure less than 120. We will cycle troponins q.8 h. x 3.   Thank you very much for this consultation.    ____________________________ Alinda SierrasEileen A. Jarold Mottoomano, PA-C ear:JT D: 04/17/2014 09:35:19 ET T: 04/17/2014 09:53:19 ET JOB#: [id number]  cc: Marjean DonnaEileen A. Margarito Courseromano, PA-C, <Dictator> Alinda SierrasEILEEN A Prerna Harold PA ELECTRONICALLY SIGNED 04/26/2014 17:08

## 2014-12-21 NOTE — Discharge Summary (Signed)
PATIENT NAME:  Howard Weaver, [name] MR#:  [id number] DATE OF BIRTH:  05/09/1956  DATE OF ADMISSION:  02/09/2014 DATE OF DISCHARGE:  02/11/2014  ADMISSION DIAGNOSIS:   Abscess.  DISCHARGE DIAGNOSIS:  Abscess/cellulitis.   CONSULTATIONS:  Dr. Egbert GaribaldiBird.   PROCEDURES:  The patient underwent an I and D in the ER on the date of admission.   Wound cultures no growth in 18 to 24 hours.   Discharge white blood cells 6.6, hemoglobin 13.2, hematocrit 39. Platelets are 176.  HOSPITAL COURSE:  A [age] year old male who has an abscess on his back that was draining, so came to the ER for further evaluation. For further details, please refer to the H and P.  1.  Abscess in the back status post incision and drainage in the ER. His wound culture is so far negative; however, it should be noted that the wound culture was actually drawn after the antibiotics had been started. His blood cultures are negative to date. He was started on broad-spectrum antibiotics with vancomycin, aztreonam. Surgery was consulted to see if he needed further evaluation and I and D, but as per Dr. Egbert GaribaldiBird, no need for further treatment with an I and D. The abscess is draining. He will be discharged with Bactrim and ciprofloxacin.  2.  Hypertension. The patient was initially hypotensive; however, his blood pressure improved with fluids and he will restart his outpatient medications.  3.  Hyperlipidemia. The patient will continue pravastatin.   DISCHARGE MEDICATIONS:   1.  Buspirone 7.5 b.i.d.  2.  Enalapril 20 mg b.i.d.  3.  Hydralazine 100 mg b.i.d.  4.  Pravastatin 20 mg at bedtime.  5.  Clonidine 0.3 b.i.d.  6.  Meloxicam 15 mg daily.  7.  Metoprolol 50 mg b.i.d.  8.  Hydrochlorothiazide 12.5 mg daily.  9.  Hydrochlorothiazide 25 mg at bedtime.  10.  Imdur 30 mg daily.  11.  Bactrim 1 tablet b.i.d. x 10 days.  12.  Ciprofloxacin 500 mg q.12 hours x 10 days.   DISCHARGE DIET: Low sodium.   DISCHARGE ACTIVITIES:  As  tolerated.  DISCHARGE FOLLOWUP:  The patient should follow up with Dr. Egbert GaribaldiBird in 2 weeks and Dr. Yves DillNeelam Khan in a week.  TIME SPENT:  35 minutes.   ____________________________ Janyth ContesSital P. Juliene PinaMody, MD spm:dmm D: 02/11/2014 12:19:24 ET T: 02/11/2014 12:32:30 ET JOB#: [id number]  cc: Ender Rorke P. Juliene PinaMody, MD, <Dictator> Mark A. Egbert GaribaldiBird, MD Margaretann LovelessNeelam S. Khan, MD Juleon Narang P Esmeralda Malay MD ELECTRONICALLY SIGNED 02/12/2014 11:18

## 2014-12-21 NOTE — H&P (Signed)
PATIENT NAME:  Howard Weaver, Howard Weaver MR#:  [id number] DATE OF BIRTH:  07-02-1956  DATE OF ADMISSION:  04/12/2014  REFERRING PHYSICIAN: Su Ley, MD.  PRIMARY CARE PHYSICIAN: Adrian Blackwater, MD.   CHIEF COMPLAINT: Coffee-ground emesis.   HISTORY OF PRESENT ILLNESS: This is a 59 year old male with known history of hypertension, hyperlipidemia, brain aneurysm; status post clipping in the past who presents with coffee-ground emesis. Reports he had coffee-ground emesis 1 episode today. Reports he had another episode before, 2 weeks as well. Reports he has been having dark-colored stools as well with sometimes occasional bright red blood with stools related to his fissure from constipation. The patient was hypotensive upon presentation with his systolic blood pressure being in the 80s, which he did respond to fluid bolus. Hemoglobin was noticed to have a significant drop from his baseline as it was 6.8 today, where it was 14.7 before 10 days, as he appears to be in mild renal failure with creatinine of 1.56. He denies any history of EGD or colonoscopy in the past. His INR was at 1.1. The patient's currently blood pressure improved. He was seen by GI with plan for endoscopy after appropriate hydration and vital signs stabilization.   PAST MEDICAL HISTORY: 1. Uncontrolled hypertension.  2. Brain aneurysm status post surgery and clipping.  3. Hyperlipidemia.   PAST SURGICAL HISTORY: Brain aneurysm repair.   ALLERGIES: PENICILLIN, IBUPROFEN.   HOME MEDICATIONS: Buspirone 7.5 mg b.i.d., enalapril 20 mg b.i.d., hydralazine 100 mg b.i.d., pravastatin 20 mg at bedtime, clonidine 0.3 mg b.i.d., meloxicam 15 mg daily, metoprolol 50 mg b.i.d., hydrochlorothiazide 12.5 mg daily, Imdur 30 mg daily.  SOCIAL HISTORY: Lives at home with his son. No smoking, alcohol, or drug use.   FAMILY HISTORY: Significant for melanoma and heart disease in the family.   REVIEW OF SYSTEMS: Denies fever, chills. Reports  fatigue, weakness.  EYES: Denies blurry vision, double vision or inflammation.  ENT: denies hearing loss, epistaxis.  RESPIRATORY: Denies cough, wheezing, hemoptysis, COPD.  CARDIOVASCULAR: Denies chest pain, edema, palpitation, syncope.  GASTROINTESTINAL: Denies nausea. Reports coffee-ground emesis, constipation, dark-colored stools. Denies any jaundice.  GENITOURINARY: Denies dysuria, hematuria, or renal colic.  ENDOCRINE: Denies polyuria, polydipsia, heat or cold intolerance.  HEMATOLOGY: Denies anemia.easy brusing INTEGUMENT: Denies acne, rash, or skin lesion.  MUSCULOSKELETAL: Denies any swelling, gout, cramps.  NEUROLOGIC: Denies CVA, TIA, tremors, vertigo, ataxia.  PSYCHIATRIC: Denies anxiety, insomnia, or depression.   PHYSICAL EXAMINATION: VITAL SIGNS: Temperature 97.6, pulse 74, respiratory rate 16, blood pressure most recent on the telemetry 110/60, saturating 100% on room air.  GENERAL: A pale, elderly male who looks comfortable, in no apparent distress.  HEENT: Head atraumatic, normocephalic. Pale conjunctivae. Anicteric sclerae. Dry oral mucosa. No oral thrush.  NECK: Supple. No thyromegaly. No JVD. No carotid bruits.  CHEST: Good air entry bilaterally. No wheezing, rales, rhonchi. No use of accessory muscle.  CARDIOVASCULAR: S1, S2 heard. No rubs, murmur, or gallops.  ABDOMEN: Soft, nontender, nondistended. Bowel sounds present.  EXTREMITIES: No edema. No clubbing. No cyanosis. Pedal pulses felt bilaterally.  PSYCHIATRIC: Appropriate affect. Awake, alert x 3. Intact judgment and insight.  NEUROLOGIC: Cranial nerves grossly intact. Motor 5/5. No focal deficits.  MUSCULOSKELETAL: No joint effusion or erythema.   PERTINENT LABORATORY DATA: Glucose 168, BUN 50, creatinine 1.56, sodium 137, potassium 4.3, chloride 105, CO2 25. White blood cells 11.1, hemoglobin 6.8, hematocrit 21.2, platelets 230. INR 1.1.   ASSESSMENT AND PLAN: 1. Anemia, secondary to gastrointestinal bleed,  most likely upper,  as the patient is having coffee-ground emesis. He is on Mobic, which is a nonsteroidal anti-inflammatory drug which we will hold. Suspicion is for nonsteroidal anti-inflammatory drug-induced ulcer or gastritis. We will monitor his hemoglobin, will transfuse him currently 3 units of packed red blood cells. We will monitor his hemoglobin and hematocrit every 6 hours. We will continue him on  Protonix drip. Keep him nothing-by-mouth. Gastroenterology and surgery were already consulted by the Emergency Room physician. The patient is going for endoscopy after the blood transfusion and stabilization. 2. Hypertension: Hold all medications as the patient is hypotensive.  3. History of coronary artery disease. Denies any chest pain or shortness of breath.  4. Hyperlipidemia. Resume statin when patient is more stable.  5. Deep vein thrombosis prophylaxis. Sequential compression devices and TED hose. No chemical anticoagulation secondary to his gastrointestinal bleed.   CODE STATUS: Full code.   Total Time Spent on admission and patient care is 55 minutes.    ____________________________ Starleen Armsawood S. Bowen Kia, MD dse:NT D: 04/12/2014 15:57:14 ET T: 04/12/2014 16:18:01 ET JOB#: [id number]  cc: Starleen Armsawood S. Monserrath Junio, MD, <Dictator> Welma Mccombs Teena IraniS Taiyo Kozma MD ELECTRONICALLY SIGNED 04/18/2014 4:16

## 2014-12-21 NOTE — H&P (Signed)
   Subjective/Chief Complaint Coffee ground emesis, anemia   History of Present Illness [name] is a pleasant 59 yo M with a history of cad s/p cabg who presents after having coffee ground emesis when visiting cardiologist today.  He was recently seen for the same 1 week ago, Hgb at that time was 14.7, now 6.8, no recent emesis.  No history of ulcers, no NSAIDS, aspirin or plavix.  Admitted for workup of GI bleed.  Otherwise doing well.   Past History CAD s/p cabg H/o brain aneurysm H/o back pain HTN   Past Medical Health Coronary Artery Disease, Hypertension   Past Med/Surgical Hx:  Chronic back pain:   Myocardial Infarct:   HTN:   brain aneurism:   ALLERGIES:  Ibuprofen: GI Distress  Penicillin: Rash  Family and Social History:  Family History Non-Contributory   Social History negative tobacco, negative ETOH   Place of Living Home   Review of Systems:  Subjective/Chief Complaint Coffee ground emesis, anemia   Fever/Chills No   Cough No   Sputum No   Abdominal Pain No   Diarrhea No   Constipation No   Nausea/Vomiting Yes   SOB/DOE No   Chest Pain No   Dysuria No   Tolerating PT No   Tolerating Diet Coffee ground emesis   Physical Exam:  GEN well developed, well nourished, no acute distress   HEENT pink conjunctivae, PERRL, hearing intact to voice, good dentition   RESP normal resp effort  clear BS  no use of accessory muscles   CARD regular rate  no murmur  no thrills   ABD denies tenderness  denies Flank Tenderness  no hernia  soft  normal BS  no Abdominal Bruits   LYMPH negative neck, negative axillae   EXTR negative cyanosis/clubbing, negative edema   SKIN normal to palpation, No rashes, No ulcers, skin turgor good   NEURO cranial nerves intact, negative rigidity, negative tremor, follows commands, cog wheel, strength:   PSYCH A+O to time, place, person, good insight    Assessment/Admission Diagnosis [name] is a pleasant 59  yo M who presents with sequelae of upper GI bleed.  Plan is transfuse 2 units then Upper endoscopy with Dr. Mechele CollinElliott with possible therapy if possible.   Plan Will follow with you, emergent surgical management if necessary.   Electronic Signatures: Jarvis NewcomerLundquist, Danean Marner A (MD)  (Signed 14-Aug-15 16:36)  Authored: CHIEF COMPLAINT and HISTORY, PAST MEDICAL/SURGIAL HISTORY, ALLERGIES, FAMILY AND SOCIAL HISTORY, REVIEW OF SYSTEMS, PHYSICAL EXAM, ASSESSMENT AND PLAN   Last Updated: 14-Aug-15 16:36 by Jarvis NewcomerLundquist, Floy Riegler A (MD)

## 2014-12-21 NOTE — Consult Note (Signed)
PATIENT NAME:  Howard Weaver, [name] MR#:  [id number] DATE OF BIRTH:  01-09-1956  DATE OF CONSULTATION:  02/10/2014  CONSULTING PHYSICIAN:  Loraine LericheMark A. Egbert GaribaldiBird, MD   REASON FOR CONSULTATION: Evaluation of back abscess.   HISTORY: This is a 59 year old male admitted to the medical service with cellulitis of his back status post incision and drainage in the Emergency Room yesterday. History dates back approximately a week with pain and swelling. Of note, the patient had a previous cyst there for a long time which was excised and then recurred. The patient states that he has had intermittent whitish drainage since that time. The patient feels better since incision and drainage.   ALLERGIES:  1. IBUPROFEN.  2. PENICILLIN.   MEDICATIONS: Can be found in the chart.   PAST MEDICAL HISTORY: As described in the history of present illness.   PHYSICAL EXAMINATION: GENERAL: The patient was examined. He is alert and oriented. His son is present as bedside. There is a dressing overlying the left upper mid back. Dressing was removed. Iodoform packing was removed. No evidence of undrained purulence was found. There was substantial amount of induration measuring at least 4 x 5 cm. Dressing was replaced.   IMPRESSION: Infected sebaceous cyst by history, recurrent.   RECOMMENDATIONS: Wound care dressing changes were ordered. I will have the patient follow up in our office in 10 to 14 days. At present, no other surgical incision and drainage is required at this time.   ____________________________ Redge GainerMark A. Egbert GaribaldiBird, MD mab:sg D: 02/10/2014 12:37:50 ET T: 02/10/2014 14:13:11 ET JOB#: [id number]  cc: Loraine LericheMark A. Egbert GaribaldiBird, MD, <Dictator> Raynald KempMARK A Shaye Lagace MD ELECTRONICALLY SIGNED 02/10/2014 17:25

## 2015-02-14 ENCOUNTER — Other Ambulatory Visit: Payer: Self-pay | Admitting: Nurse Practitioner

## 2015-02-14 ENCOUNTER — Ambulatory Visit
Admission: RE | Admit: 2015-02-14 | Discharge: 2015-02-14 | Disposition: A | Discharge status: Home / Self Care | Payer: PPO | Source: Ambulatory Visit | Attending: Nurse Practitioner | Admitting: Nurse Practitioner

## 2015-02-14 DIAGNOSIS — M25552 Pain in left hip: Secondary | ICD-10-CM

## 2015-02-14 DIAGNOSIS — M25562 Pain in left knee: Secondary | ICD-10-CM

## 2015-02-25 ENCOUNTER — Ambulatory Visit: Payer: Self-pay

## 2015-03-11 ENCOUNTER — Ambulatory Visit: Payer: Self-pay

## 2015-10-09 DIAGNOSIS — E782 Mixed hyperlipidemia: Secondary | ICD-10-CM | POA: Diagnosis not present

## 2015-10-09 DIAGNOSIS — Z125 Encounter for screening for malignant neoplasm of prostate: Secondary | ICD-10-CM | POA: Diagnosis not present

## 2015-10-09 DIAGNOSIS — M25569 Pain in unspecified knee: Secondary | ICD-10-CM | POA: Diagnosis not present

## 2015-10-09 DIAGNOSIS — Z0001 Encounter for general adult medical examination with abnormal findings: Secondary | ICD-10-CM | POA: Diagnosis not present

## 2015-10-09 DIAGNOSIS — K279 Peptic ulcer, site unspecified, unspecified as acute or chronic, without hemorrhage or perforation: Secondary | ICD-10-CM | POA: Diagnosis not present

## 2015-10-09 DIAGNOSIS — M545 Low back pain: Secondary | ICD-10-CM | POA: Diagnosis not present

## 2015-10-09 DIAGNOSIS — I2584 Coronary atherosclerosis due to calcified coronary lesion: Secondary | ICD-10-CM | POA: Diagnosis not present

## 2015-10-09 DIAGNOSIS — I10 Essential (primary) hypertension: Secondary | ICD-10-CM | POA: Diagnosis not present

## 2015-11-25 ENCOUNTER — Encounter
Admission: EM | Disposition: A | Discharge status: Home / Self Care | Payer: Self-pay | Source: Home / Self Care | Attending: Internal Medicine

## 2015-11-25 ENCOUNTER — Inpatient Hospital Stay
Admission: EM | Admit: 2015-11-25 | Discharge: 2015-11-26 | DRG: 392 | Disposition: A | Discharge status: Home / Self Care | Payer: PPO | Attending: Internal Medicine | Admitting: Internal Medicine

## 2015-11-25 ENCOUNTER — Emergency Department: Payer: PPO

## 2015-11-25 ENCOUNTER — Encounter: Payer: Self-pay | Admitting: Emergency Medicine

## 2015-11-25 DIAGNOSIS — M5415 Radiculopathy, thoracolumbar region: Secondary | ICD-10-CM | POA: Diagnosis present

## 2015-11-25 DIAGNOSIS — I129 Hypertensive chronic kidney disease with stage 1 through stage 4 chronic kidney disease, or unspecified chronic kidney disease: Secondary | ICD-10-CM | POA: Diagnosis present

## 2015-11-25 DIAGNOSIS — R0602 Shortness of breath: Secondary | ICD-10-CM | POA: Diagnosis not present

## 2015-11-25 DIAGNOSIS — Z880 Allergy status to penicillin: Secondary | ICD-10-CM | POA: Diagnosis not present

## 2015-11-25 DIAGNOSIS — G894 Chronic pain syndrome: Secondary | ICD-10-CM | POA: Diagnosis present

## 2015-11-25 DIAGNOSIS — I252 Old myocardial infarction: Secondary | ICD-10-CM | POA: Diagnosis not present

## 2015-11-25 DIAGNOSIS — N183 Chronic kidney disease, stage 3 (moderate): Secondary | ICD-10-CM | POA: Diagnosis not present

## 2015-11-25 DIAGNOSIS — I208 Other forms of angina pectoris: Secondary | ICD-10-CM | POA: Diagnosis not present

## 2015-11-25 DIAGNOSIS — K219 Gastro-esophageal reflux disease without esophagitis: Secondary | ICD-10-CM | POA: Diagnosis not present

## 2015-11-25 DIAGNOSIS — R0789 Other chest pain: Secondary | ICD-10-CM | POA: Diagnosis not present

## 2015-11-25 DIAGNOSIS — K279 Peptic ulcer, site unspecified, unspecified as acute or chronic, without hemorrhage or perforation: Secondary | ICD-10-CM | POA: Diagnosis present

## 2015-11-25 DIAGNOSIS — N179 Acute kidney failure, unspecified: Secondary | ICD-10-CM | POA: Diagnosis not present

## 2015-11-25 DIAGNOSIS — F411 Generalized anxiety disorder: Secondary | ICD-10-CM | POA: Diagnosis not present

## 2015-11-25 DIAGNOSIS — Z79899 Other long term (current) drug therapy: Secondary | ICD-10-CM | POA: Diagnosis not present

## 2015-11-25 DIAGNOSIS — R000 Tachycardia, unspecified: Secondary | ICD-10-CM | POA: Diagnosis not present

## 2015-11-25 DIAGNOSIS — I10 Essential (primary) hypertension: Secondary | ICD-10-CM | POA: Diagnosis not present

## 2015-11-25 DIAGNOSIS — I214 Non-ST elevation (NSTEMI) myocardial infarction: Secondary | ICD-10-CM | POA: Diagnosis not present

## 2015-11-25 DIAGNOSIS — F329 Major depressive disorder, single episode, unspecified: Secondary | ICD-10-CM | POA: Diagnosis present

## 2015-11-25 DIAGNOSIS — R7989 Other specified abnormal findings of blood chemistry: Secondary | ICD-10-CM

## 2015-11-25 DIAGNOSIS — Z87891 Personal history of nicotine dependence: Secondary | ICD-10-CM

## 2015-11-25 DIAGNOSIS — R079 Chest pain, unspecified: Secondary | ICD-10-CM | POA: Diagnosis not present

## 2015-11-25 DIAGNOSIS — R778 Other specified abnormalities of plasma proteins: Secondary | ICD-10-CM | POA: Diagnosis present

## 2015-11-25 HISTORY — DX: Acute myocardial infarction, unspecified: I21.9

## 2015-11-25 HISTORY — PX: CARDIAC CATHETERIZATION: SHX172

## 2015-11-25 LAB — COMPREHENSIVE METABOLIC PANEL
ALBUMIN: 4 g/dL (ref 3.5–5.0)
ALT: 18 U/L (ref 17–63)
ANION GAP: 11 (ref 5–15)
AST: 28 U/L (ref 15–41)
Alkaline Phosphatase: 50 U/L (ref 38–126)
BUN: 23 mg/dL — AB (ref 6–20)
CHLORIDE: 105 mmol/L (ref 101–111)
CO2: 18 mmol/L — AB (ref 22–32)
Calcium: 9.4 mg/dL (ref 8.9–10.3)
Creatinine, Ser: 1.45 mg/dL — ABNORMAL HIGH (ref 0.61–1.24)
GFR calc Af Amer: 59 mL/min — ABNORMAL LOW (ref >60)
GFR calc non Af Amer: 51 mL/min — ABNORMAL LOW (ref >60)
Glucose, Bld: 159 mg/dL — ABNORMAL HIGH (ref 65–99)
POTASSIUM: 3.5 mmol/L (ref 3.5–5.1)
SODIUM: 134 mmol/L — AB (ref 135–145)
Total Bilirubin: 0.9 mg/dL (ref 0.3–1.2)
Total Protein: 7.7 g/dL (ref 6.5–8.1)

## 2015-11-25 LAB — CBC
HCT: 45.9 % (ref 40.0–52.0)
Hemoglobin: 15.3 g/dL (ref 13.0–18.0)
MCH: 27.4 pg (ref 26.0–34.0)
MCHC: 33.3 g/dL (ref 32.0–36.0)
MCV: 82.3 fL (ref 80.0–100.0)
Platelets: 259 10*3/uL (ref 150–440)
RBC: 5.58 MIL/uL (ref 4.40–5.90)
RDW: 15.7 % — AB (ref 11.5–14.5)
WBC: 8.1 10*3/uL (ref 3.8–10.6)

## 2015-11-25 LAB — APTT: APTT: 27 s (ref 24–36)

## 2015-11-25 LAB — TROPONIN I
TROPONIN I: 0.21 ng/mL — AB (ref <0.031)
Troponin I: 1.41 ng/mL — ABNORMAL HIGH (ref <0.031)

## 2015-11-25 SURGERY — LEFT HEART CATH AND CORONARY ANGIOGRAPHY
Anesthesia: Moderate Sedation | Laterality: Right

## 2015-11-25 MED ORDER — ACETAMINOPHEN 650 MG RE SUPP: 650.0000 mg | Freq: Four times a day (QID) | RECTAL | Status: DC | PRN

## 2015-11-25 MED ORDER — LORAZEPAM 2 MG/ML IJ SOLN
1.0000 mg | Freq: Once | INTRAMUSCULAR | Status: DC
  Filled 2015-11-25: qty 1

## 2015-11-25 MED ORDER — ACETAMINOPHEN 325 MG PO TABS: 650.0000 mg | ORAL_TABLET | Freq: Four times a day (QID) | ORAL | Status: DC | PRN

## 2015-11-25 MED ORDER — SODIUM CHLORIDE 0.9 % IV SOLN
Freq: Once | INTRAVENOUS | Status: AC
  Administered 2015-11-25: 10:00:00 via INTRAVENOUS

## 2015-11-25 MED ORDER — ONDANSETRON HCL 4 MG/2ML IJ SOLN: 4.0000 mg | Freq: Four times a day (QID) | INTRAMUSCULAR | Status: DC | PRN

## 2015-11-25 MED ORDER — ONDANSETRON HCL 4 MG PO TABS: 4.0000 mg | ORAL_TABLET | Freq: Four times a day (QID) | ORAL | Status: DC | PRN

## 2015-11-25 MED ORDER — SODIUM CHLORIDE 0.9 % IV SOLN: 250.0000 mL | INTRAVENOUS | Status: DC | PRN

## 2015-11-25 MED ORDER — ASPIRIN 81 MG PO CHEW
81.0000 mg | CHEWABLE_TABLET | ORAL | Status: AC
Start: 2015-11-26 — End: 2015-11-26
  Administered 2015-11-26: 81 mg via ORAL
  Filled 2015-11-25: qty 1

## 2015-11-25 MED ORDER — SODIUM CHLORIDE 0.9% FLUSH: 3.0000 mL | INTRAVENOUS | Status: DC | PRN

## 2015-11-25 MED ORDER — ISOSORBIDE MONONITRATE ER 30 MG PO TB24
30.0000 mg | ORAL_TABLET | Freq: Every day | ORAL | Status: DC
  Administered 2015-11-26: 30 mg via ORAL
  Filled 2015-11-25: qty 1

## 2015-11-25 MED ORDER — SODIUM CHLORIDE 0.9 % WEIGHT BASED INFUSION: 1.0000 mL/kg/h | INTRAVENOUS | Status: DC

## 2015-11-25 MED ORDER — ACETAMINOPHEN 325 MG PO TABS: 650.0000 mg | ORAL_TABLET | ORAL | Status: DC | PRN

## 2015-11-25 MED ORDER — MIDAZOLAM HCL 2 MG/2ML IJ SOLN
INTRAMUSCULAR | Status: DC | PRN
  Administered 2015-11-25: 1 mg via INTRAVENOUS

## 2015-11-25 MED ORDER — SODIUM CHLORIDE 0.9 % IV SOLN
INTRAVENOUS | Status: DC
  Administered 2015-11-25 – 2015-11-26 (×3): via INTRAVENOUS

## 2015-11-25 MED ORDER — SODIUM CHLORIDE 0.9% FLUSH
3.0000 mL | Freq: Two times a day (BID) | INTRAVENOUS | Status: DC
Start: 2015-11-25 — End: 2015-11-25

## 2015-11-25 MED ORDER — CYCLOBENZAPRINE HCL 10 MG PO TABS: 10.0000 mg | ORAL_TABLET | Freq: Three times a day (TID) | ORAL | Status: DC | PRN

## 2015-11-25 MED ORDER — PANTOPRAZOLE SODIUM 40 MG PO TBEC
40.0000 mg | DELAYED_RELEASE_TABLET | Freq: Every day | ORAL | Status: DC
  Administered 2015-11-25 – 2015-11-26 (×2): 40 mg via ORAL
  Filled 2015-11-25 (×2): qty 1

## 2015-11-25 MED ORDER — SODIUM CHLORIDE 0.9% FLUSH: 3.0000 mL | Freq: Two times a day (BID) | INTRAVENOUS | Status: DC

## 2015-11-25 MED ORDER — HEPARIN (PORCINE) IN NACL 100-0.45 UNIT/ML-% IJ SOLN
1000.0000 [IU]/h | Freq: Once | INTRAMUSCULAR | Status: AC
  Administered 2015-11-25: 1000 [IU]/h via INTRAVENOUS
  Filled 2015-11-25: qty 250

## 2015-11-25 MED ORDER — ROSUVASTATIN CALCIUM 20 MG PO TABS
20.0000 mg | ORAL_TABLET | Freq: Every day | ORAL | Status: DC
  Administered 2015-11-26: 20 mg via ORAL
  Filled 2015-11-25: qty 1

## 2015-11-25 MED ORDER — FENTANYL CITRATE (PF) 100 MCG/2ML IJ SOLN
INTRAMUSCULAR | Status: DC | PRN
  Administered 2015-11-25: 50 ug via INTRAVENOUS

## 2015-11-25 MED ORDER — ONDANSETRON HCL 4 MG/2ML IJ SOLN
4.0000 mg | Freq: Once | INTRAMUSCULAR | Status: AC
  Administered 2015-11-25: 4 mg via INTRAVENOUS
  Filled 2015-11-25: qty 2

## 2015-11-25 MED ORDER — CLONIDINE HCL 0.1 MG PO TABS
0.3000 mg | ORAL_TABLET | Freq: Two times a day (BID) | ORAL | Status: DC
  Administered 2015-11-25 – 2015-11-26 (×2): 0.3 mg via ORAL
  Filled 2015-11-25 (×2): qty 3

## 2015-11-25 MED ORDER — ENOXAPARIN SODIUM 40 MG/0.4ML ~~LOC~~ SOLN: 40.0000 mg | SUBCUTANEOUS | Status: DC

## 2015-11-25 MED ORDER — BUSPIRONE HCL 5 MG PO TABS
7.5000 mg | ORAL_TABLET | Freq: Two times a day (BID) | ORAL | Status: DC
  Administered 2015-11-25 – 2015-11-26 (×2): 7.5 mg via ORAL
  Filled 2015-11-25 (×2): qty 1
  Filled 2015-11-25: qty 2
  Filled 2015-11-25 (×2): qty 1

## 2015-11-25 MED ORDER — IOPAMIDOL (ISOVUE-300) INJECTION 61%
INTRAVENOUS | Status: DC | PRN
  Administered 2015-11-25: 130 mL via INTRA_ARTERIAL

## 2015-11-25 MED ORDER — NITROGLYCERIN 2 % TD OINT
1.0000 [in_us] | TOPICAL_OINTMENT | Freq: Once | TRANSDERMAL | Status: AC
  Administered 2015-11-25: 1 [in_us] via TOPICAL
  Filled 2015-11-25: qty 1

## 2015-11-25 MED ORDER — HEPARIN SODIUM (PORCINE) 5000 UNIT/ML IJ SOLN: 4000.0000 [IU] | Freq: Once | INTRAMUSCULAR | Status: DC

## 2015-11-25 MED ORDER — SODIUM CHLORIDE 0.9% FLUSH
3.0000 mL | INTRAVENOUS | Status: DC | PRN
Start: 2015-11-25 — End: 2015-11-25

## 2015-11-25 MED ORDER — METOPROLOL TARTRATE 100 MG PO TABS
100.0000 mg | ORAL_TABLET | Freq: Two times a day (BID) | ORAL | Status: DC
  Administered 2015-11-25 – 2015-11-26 (×2): 100 mg via ORAL
  Filled 2015-11-25 (×2): qty 1

## 2015-11-25 MED ORDER — FENTANYL CITRATE (PF) 100 MCG/2ML IJ SOLN
INTRAMUSCULAR | Status: AC
  Filled 2015-11-25: qty 2

## 2015-11-25 MED ORDER — HEPARIN (PORCINE) IN NACL 2-0.9 UNIT/ML-% IJ SOLN
INTRAMUSCULAR | Status: AC
  Filled 2015-11-25: qty 1000

## 2015-11-25 MED ORDER — SODIUM CHLORIDE 0.9 % WEIGHT BASED INFUSION: 3.0000 mL/kg/h | INTRAVENOUS | Status: DC

## 2015-11-25 MED ORDER — MIDAZOLAM HCL 2 MG/2ML IJ SOLN
INTRAMUSCULAR | Status: AC
  Filled 2015-11-25: qty 2

## 2015-11-25 SURGICAL SUPPLY — 9 items
CATH INFINITI 5FR ANG PIGTAIL (CATHETERS) ×2 IMPLANT
CATH INFINITI 5FR JL4 (CATHETERS) ×2 IMPLANT
CATH INFINITI JR4 5F (CATHETERS) ×2 IMPLANT
DEVICE CLOSURE MYNXGRIP 5F (Vascular Products) ×2 IMPLANT
KIT MANI 3VAL PERCEP (MISCELLANEOUS) ×2 IMPLANT
NEEDLE PERC 18GX7CM (NEEDLE) ×2 IMPLANT
PACK CARDIAC CATH (CUSTOM PROCEDURE TRAY) ×2 IMPLANT
SHEATH PINNACLE 5F 10CM (SHEATH) ×2 IMPLANT
WIRE EMERALD 3MM-J .035X150CM (WIRE) ×2 IMPLANT

## 2015-11-25 NOTE — ED Notes (Signed)
Patient presents to the ED with intermittent chest pain x 2 days with shortness of breath, diaphoresis and dizziness.  Patient reports history of a heart attack 2 years ago.  Patient is in no obvious distress at this time.  Patient was given 324mg  of aspirin by EMS.  Patient is in no obvious distress at this time.

## 2015-11-25 NOTE — ED Provider Notes (Signed)
[facility] Emergency Department Provider Note     Time seen: ----------------------------------------- 9:39 AM on 11/25/2015 -----------------------------------------    I have reviewed the triage vital signs and the nursing notes.   HISTORY  Chief Complaint No chief complaint on file.    HPI Howard Weaver is a 60 y.o. male brought to the ER by EMS for chest pressure. Patient describes as midsternal, of the makes it better or worse. He has had some sweats and nausea with it over the past several days. He does have a cardiologist, has not had recent changes in his medicines, denies any recent illness. Currently the pain is mild.    Past Medical History  Diagnosis Date  . Chest pain   . DU (duodenal ulcer)   . Hypertension   . GAD (generalized anxiety disorder)   . Tachycardia   . Radiculopathy of thoracolumbar region     There are no active problems to display for this patient.   No past surgical history on file.  Allergies Penicillins  Social History Social History  Substance Use Topics  . Smoking status: Not on file  . Smokeless tobacco: Not on file  . Alcohol Use: Not on file    Review of Systems Constitutional: Negative for fever. Eyes: Negative for visual changes. ENT: Negative for sore throat. Cardiovascular: Positive for chest pain Respiratory: Negative for shortness of breath. Gastrointestinal: Negative for abdominal pain, positive for nausea Genitourinary: Negative for dysuria. Musculoskeletal: Negative for back pain. Skin: Positive for sweats Neurological: Negative for headaches, focal weakness or numbness. Psychiatric: Positive for anxiety  10-point ROS otherwise negative.  ____________________________________________   PHYSICAL EXAM:  VITAL SIGNS: ED Triage Vitals  Enc Vitals Group     BP --      Pulse --      Resp --      Temp --      Temp src --      SpO2 --      Weight --      Height --    Head Cir --      Peak Flow --      Pain Score --      Pain Loc --      Pain Edu? --      Excl. in GC? --     Constitutional: Alert and oriented. Mild distress Eyes: Conjunctivae are normal. PERRL. Normal extraocular movements. ENT   Head: Normocephalic and atraumatic.   Nose: No congestion/rhinnorhea.   Mouth/Throat: Mucous membranes are moist.   Neck: No stridor. Cardiovascular: Rapid rate, regular rhythm, Normal and symmetric distal pulses are present in all extremities. No murmurs, rubs, or gallops. Respiratory: Normal respiratory effort without tachypnea nor retractions. Breath sounds are clear and equal bilaterally. No wheezes/rales/rhonchi. Gastrointestinal: Soft and nontender. No distention. No abdominal bruits.  Musculoskeletal: Nontender with normal range of motion in all extremities. No joint effusions.  No lower extremity tenderness nor edema. Neurologic:  Normal speech and language. No gross focal neurologic deficits are appreciated.  Skin:  Skin is warm, with diaphoresis Psychiatric: Mood and affect are normal. Speech and behavior are normal. Patient exhibits appropriate insight and judgment. ____________________________________________  EKG: Interpreted by me. Sinus tachycardia with a rate of 142 bpm, normal PR interval, normal QRS, long QT interval. Normal axis. Likely inferior infarct age indeterminate,  ____________________________________________  ED COURSE:  Pertinent labs & imaging results that were available during my care of the patient were reviewed by me and considered in  my medical decision making (see chart for details). Patient resents for chest pain for several days associated with nausea and sweats. He presents tachycardic area and we will check cardiac labs and reevaluate. ____________________________________________    LABS (pertinent positives/negatives)  Labs Reviewed  CBC - Abnormal; Notable for the following:    RDW 15.7 (*)     All other components within normal limits  TROPONIN I - Abnormal; Notable for the following:    Troponin I 0.21 (*)    All other components within normal limits  COMPREHENSIVE METABOLIC PANEL - Abnormal; Notable for the following:    Sodium 134 (*)    CO2 18 (*)    Glucose, Bld 159 (*)    BUN 23 (*)    Creatinine, Ser 1.45 (*)    GFR calc non Af Amer 51 (*)    GFR calc Af Amer 59 (*)    All other components within normal limits   CRITICAL CARE Performed by: Emily FilbertWilliams, Tedric Leeth E   Total critical care time: 30 minutes  Critical care time was exclusive of separately billable procedures and treating other patients.  Critical care was necessary to treat or prevent imminent or life-threatening deterioration.  Critical care was time spent personally by me on the following activities: development of treatment plan with patient and/or surrogate as well as nursing, discussions with consultants, evaluation of patient's response to treatment, examination of patient, obtaining history from patient or surrogate, ordering and performing treatments and interventions, ordering and review of laboratory studies, ordering and review of radiographic studies, pulse oximetry and re-evaluation of patient's condition.   RADIOLOGY Images were viewed by me  Chest x-ray IMPRESSION: No active disease. ____________________________________________  FINAL ASSESSMENT AND PLAN  Chest pain, Elevated troponin  Plan: Patient with labs and imaging as dictated above. Patient is currently feeling better, he's received aspirin, Nitropaste and Ativan. Later it was discovered his troponin was 0.21, he was started on a heparin drip. I discussed with his cardiologist who recommends admission. Patient may require heart catheterization. He had a negative stress test within the last year.   Emily FilbertWilliams, Gillis Boardley E, MD   Emily FilbertJonathan E Yakir Wenke, MD 11/25/15 [phone]

## 2015-11-25 NOTE — ED Notes (Signed)
Patient has been seen by cardiology. Will go to cath lab today.

## 2015-11-25 NOTE — ED Notes (Signed)
Patient taken to cath lab

## 2015-11-25 NOTE — ED Notes (Signed)
MD made aware of patient's elevated troponin.

## 2015-11-25 NOTE — Progress Notes (Signed)
Howard Weaver is a 60 y.o. male  [id number]  Primary Cardiologist: Kaylany Tesoriero Reason for Consultation: NSTEMI  HPI: [id number] [id number] presented with chest pains pressure type associated with SOB, and has elevated troponins.   Review of Systems: No orthopnea or PND   Past Medical History  Diagnosis Date  . Chest pain   . DU (duodenal ulcer)   . Hypertension   . GAD (generalized anxiety disorder)   . Tachycardia   . Radiculopathy of thoracolumbar region      (Not in a hospital admission)   . busPIRone  7.5 mg Oral BID  . cloNIDine  0.3 mg Oral BID  . isosorbide mononitrate  30 mg Oral Daily  . LORazepam  1 mg Intravenous Once  . metoprolol  100 mg Oral BID  . pantoprazole  40 mg Oral Daily  . rosuvastatin  20 mg Oral Daily    Infusions: . sodium chloride 100 mL/hr at 11/25/15 1207    Allergies  Allergen Reactions  . Penicillins Anaphylaxis    Has patient had a PCN reaction causing immediate rash, facial/tongue/throat swelling, SOB or lightheadedness with hypotension: Yes Has patient had a PCN reaction causing severe rash involving mucus membranes or skin necrosis: Yes Has patient had a PCN reaction that required hospitalization Yes Has patient had a PCN reaction occurring within the last 10 years: No If all of the above answers are "NO", then may proceed with Cephalosporin use.   . Ibuprofen Other (See Comments)    Gi upset    Social History   Social History  . Marital Status: Married    Spouse Name: N/A  . Number of Children: N/A  . Years of Education: N/A   Occupational History  . Not on file.   Social History Main Topics  . Smoking status: Former Smoker    Quit date: 11/25/1975  . Smokeless tobacco: Not on file  . Alcohol Use: No  . Drug Use: Not on file  . Sexual Activity: Not on file   Other Topics Concern  . Not on file   Social History Narrative    No family history on file.  PHYSICAL EXAM: Filed Vitals:   11/25/15 1230 11/25/15  1245  BP: 114/92 99/87  Pulse: 109 112  Resp: 29 23    No intake or output data in the 24 hours ending 11/25/15 1323  General:  Well appearing. No respiratory difficulty HEENT: normal Neck: supple. no JVD. Carotids 2+ bilat; no bruits. No lymphadenopathy or thryomegaly appreciated. Cor: PMI nondisplaced. Regular rate & rhythm. No rubs, gallops or murmurs. Lungs: clear Abdomen: soft, nontender, nondistended. No hepatosplenomegaly. No bruits or masses. Good bowel sounds. Extremities: no cyanosis, clubbing, rash, edema Neuro: alert & oriented x 3, cranial nerves grossly intact. moves all 4 extremities w/o difficulty. Affect pleasant.  ECG: NSR no st changes  Results for orders placed or performed during the hospital encounter of 11/25/15 (from the past 24 hour(s))  CBC     Status: Abnormal   Collection Time: 11/25/15  9:58 AM  Result Value Ref Range   WBC 8.1 3.8 - 10.6 K/uL   RBC 5.58 4.40 - 5.90 MIL/uL   Hemoglobin 15.3 13.0 - 18.0 g/dL   HCT 40.945.9 81.140.0 - 91.452.0 %   MCV 82.3 80.0 - 100.0 fL   MCH 27.4 26.0 - 34.0 pg   MCHC 33.3 32.0 - 36.0 g/dL   RDW 78.215.7 (H) 95.611.5 - 21.314.5 %   Platelets 259 150 -  440 K/uL  Troponin I     Status: Abnormal   Collection Time: 11/25/15  9:58 AM  Result Value Ref Range   Troponin I 0.21 (H) <0.031 ng/mL  Comprehensive metabolic panel     Status: Abnormal   Collection Time: 11/25/15  9:58 AM  Result Value Ref Range   Sodium 134 (L) 135 - 145 mmol/L   Potassium 3.5 3.5 - 5.1 mmol/L   Chloride 105 101 - 111 mmol/L   CO2 18 (L) 22 - 32 mmol/L   Glucose, Bld 159 (H) 65 - 99 mg/dL   BUN 23 (H) 6 - 20 mg/dL   Creatinine, Ser 1.61 (H) 0.61 - 1.24 mg/dL   Calcium 9.4 8.9 - 09.6 mg/dL   Total Protein 7.7 6.5 - 8.1 g/dL   Albumin 4.0 3.5 - 5.0 g/dL   AST 28 15 - 41 U/L   ALT 18 17 - 63 U/L   Alkaline Phosphatase 50 38 - 126 U/L   Total Bilirubin 0.9 0.3 - 1.2 mg/dL   GFR calc non Af Amer 51 (L) >60 mL/min   GFR calc Af Amer 59 (L) >60 mL/min   Anion  gap 11 5 - 15  APTT     Status: None   Collection Time: 11/25/15  9:59 AM  Result Value Ref Range   aPTT 27 24 - 36 seconds   Dg Chest Port 1 View  11/25/2015  CLINICAL DATA:  Intermittent chest pain for 2 days, shortness of Breath EXAM: PORTABLE CHEST 1 VIEW COMPARISON:  05/01/2014 FINDINGS: Cardiomediastinal silhouette is stable. No acute infiltrate or pleural effusion. No pulmonary edema. Bony thorax is unremarkable. IMPRESSION: No active disease. Electronically Signed   By: Natasha Mead M.D.   On: 11/25/2015 10:34     ASSESSMENT AND PLAN: NSTEMI, had normal EF, and normal stress test on 516/16 in office and now has chest pains with elevated troponins. Will do cath.  Damonica Chopra A

## 2015-11-25 NOTE — Progress Notes (Signed)
RN notified MD Allena KatzPatel about pt troponin level of 1.41. No new orders given. Will continue to monitor.  Howard Weaver, Howard Weaver

## 2015-11-25 NOTE — H&P (Signed)
[facility] [place] at [facility]   PATIENT NAME: Howard Weaver    MR#:  [id number]  DATE OF BIRTH:  [birth year]  DATE OF ADMISSION:  11/25/2015  PRIMARY CARE PHYSICIAN: Carlean Jews, NP   REQUESTING/REFERRING PHYSICIAN: Dr. Theressa Millard  CHIEF COMPLAINT: Chest pain    Chief Complaint  Patient presents with  . Chest Pain    HISTORY OF PRESENT ILLNESS:  Howard Weaver  is a 60 y.o. male with a known history of generalized anxiety comes in because of chest pain. Noted to have Midsternal chest discomfort, pressure-like pain since yesterday radiating to the left arm associated with shortness of breath, sweating. Says that chest pain comes and goes when it comes it so severe leg 10 out of 10 severity.. No aggravating factors no relieving factors.  PAST MEDICAL HISTORY:   Past Medical History  Diagnosis Date  . Chest pain   . DU (duodenal ulcer)   . Hypertension   . GAD (generalized anxiety disorder)   . Tachycardia   . Radiculopathy of thoracolumbar region     PAST SURGICAL HISTOIRY:  History reviewed. No pertinent past surgical history.  SOCIAL HISTORY:   Social History  Substance Use Topics  . Smoking status: Former Smoker    Quit date: 11/25/1975  . Smokeless tobacco: Not on file  . Alcohol Use: No    FAMILY HISTORY:  No family history on file.  DRUG ALLERGIES:   Allergies  Allergen Reactions  . Penicillins Anaphylaxis    Has patient had a PCN reaction causing immediate rash, facial/tongue/throat swelling, SOB or lightheadedness with hypotension: Yes Has patient had a PCN reaction causing severe rash involving mucus membranes or skin necrosis: Yes Has patient had a PCN reaction that required hospitalization Yes Has patient had a PCN reaction occurring within the last 10 years: No If all of the above answers are "NO", then may proceed with Cephalosporin use.   . Ibuprofen Other (See Comments)    Gi upset    REVIEW OF  SYSTEMS:  CONSTITUTIONAL: No fever, fatigue or weakness.  EYES: No blurred or double vision.  EARS, NOSE, AND THROAT: No tinnitus or ear pain.  RESPIRATORY: No cough, shortness of breath, wheezing or hemoptysis.  CARDIOVASCULAR: No chest pain, orthopnea, edema.  GASTROINTESTINAL: No nausea, vomiting, diarrhea or abdominal pain.  GENITOURINARY: No dysuria, hematuria.  ENDOCRINE: No polyuria, nocturia,  HEMATOLOGY: No anemia, easy bruising or bleeding SKIN: No rash or lesion. MUSCULOSKELETAL: No joint pain or arthritis.   NEUROLOGIC: No tingling, numbness, weakness.  PSYCHIATRY: No anxiety or depression.   MEDICATIONS AT HOME:   Prior to Admission medications   Medication Sig Start Date End Date Taking? Authorizing Provider  benazepril (LOTENSIN) 20 MG tablet Take 1 tablet by mouth daily. 11/01/15  Yes Historical Provider, MD  busPIRone (BUSPAR) 7.5 MG tablet Take 7.5 mg by mouth 2 (two) times daily.   Yes Historical Provider, MD  cloNIDine (CATAPRES) 0.3 MG tablet Take 0.3 mg by mouth 2 (two) times daily.   Yes Historical Provider, MD  cyclobenzaprine (FLEXERIL) 10 MG tablet Take 10 mg by mouth 3 (three) times daily as needed for muscle spasms.   Yes Historical Provider, MD  DULoxetine (CYMBALTA) 30 MG capsule Take 1 capsule by mouth daily. 11/20/15  Yes Historical Provider, MD  hydrALAZINE (APRESOLINE) 100 MG tablet Take 100 mg by mouth 2 (two) times daily.   Yes Historical Provider, MD  hydrochlorothiazide (HYDRODIURIL) 25 MG tablet Take 25 mg by  mouth daily.   Yes Historical Provider, MD  HYDROcodone-acetaminophen (NORCO/VICODIN) 5-325 MG tablet Take 1 tablet by mouth 2 (two) times daily as needed. 11/06/15  Yes Historical Provider, MD  isosorbide mononitrate (IMDUR) 30 MG 24 hr tablet Take 30 mg by mouth daily.   Yes Historical Provider, MD  metoprolol (LOPRESSOR) 100 MG tablet Take 100 mg by mouth 2 (two) times daily.   Yes Historical Provider, MD  pantoprazole (PROTONIX) 40 MG tablet  Take 40 mg by mouth daily.   Yes Historical Provider, MD  rosuvastatin (CRESTOR) 20 MG tablet Take 20 mg by mouth daily.   Yes Historical Provider, MD  traMADol (ULTRAM) 50 MG tablet Take 1 tablet by mouth as needed. 11/15/15  Yes Historical Provider, MD      VITAL SIGNS:  Blood pressure 98/85, pulse 115, resp. rate 21, height  (1.727 m), weight 90.719 kg (200 lb), SpO2 99 %.  PHYSICAL EXAMINATION:  GENERAL:  60 y.o.-year-old patient lying in the bed with no acute distress.  EYES: Pupils equal, round, reactive to light and accommodation. No scleral icterus. Extraocular muscles intact.  HEENT: Head atraumatic, normocephalic. Oropharynx and nasopharynx clear.  NECK:  Supple, no jugular venous distention. No thyroid enlargement, no tenderness.  LUNGS: Normal breath sounds bilaterally, no wheezing, rales,rhonchi or crepitation. No use of accessory muscles of respiration.  CARDIOVASCULAR: S1, S2 normal. No murmurs, rubs, or gallops.  ABDOMEN: Soft, nontender, nondistended. Bowel sounds present. No organomegaly or mass.  EXTREMITIES: No pedal edema, cyanosis, or clubbing.  NEUROLOGIC: Cranial nerves II through XII are intact. Muscle strength 5/5 in all extremities. Sensation intact. Gait not checked.  PSYCHIATRIC: The patient is alert and oriented x 3.  SKIN: No obvious rash, lesion, or ulcer.   LABORATORY PANEL:   CBC  Recent Labs Lab 11/25/15 0958  WBC 8.1  HGB 15.3  HCT 45.9  PLT 259   ------------------------------------------------------------------------------------------------------------------  Chemistries   Recent Labs Lab 11/25/15 0958  NA 134*  K 3.5  CL 105  CO2 18*  GLUCOSE 159*  BUN 23*  CREATININE 1.45*  CALCIUM 9.4  AST 28  ALT 18  ALKPHOS 50  BILITOT 0.9   ------------------------------------------------------------------------------------------------------------------  Cardiac Enzymes  Recent Labs Lab 11/25/15 0958  TROPONINI 0.21*    ------------------------------------------------------------------------------------------------------------------  RADIOLOGY:  Dg Chest Port 1 View  11/25/2015  CLINICAL DATA:  Intermittent chest pain for 2 days, shortness of Breath EXAM: PORTABLE CHEST 1 VIEW COMPARISON:  05/01/2014 FINDINGS: Cardiomediastinal silhouette is stable. No acute infiltrate or pleural effusion. No pulmonary edema. Bony thorax is unremarkable. IMPRESSION: No active disease. Electronically Signed   By: Natasha Mead M.D.   On: 11/25/2015 10:34    EKG:   Orders placed or performed during the hospital encounter of 11/25/15  . ED EKG  . ED EKG   EKG showed sinus tachycardia 1 42 bpm IMPRESSION AND PLAN:   #1 chest pain secondary to non-ST elevation MI: Symptoms of chest pressure and also sweating and shortness of breath with chest pain concerning for acute coronary syndrome. Patient had history of brain aneurysm surgery 3 years ago, gastric ulcer bleeding surgery 2 years ago so I will hold off on fullanticoagulation at this time. Continue small dose aspirin, beta blockers, statins, nitrates appreciate Dr. Adrian Blackwater  seeing the patient and see if he can go for angiogram tomorrow. Cycle the troponins 2 more times. 2. chronic renal failure and chronic kidney disease stage III. A she is not aware of kidney disease. Looking  at labs from September 2015 his creatinine was 1.49. #3. generalizd anxiety/chronic pain syndrome;   All the records are reviewed and case discussed with ED provider. Management plans discussed with the patient, family and they are in agreement.  CODE STATUS: full  TOTAL TIME TAKING CARE OF THIS PATIENT: 55inutes.    Katha HammingKONIDENA,Larrell Rapozo M.D on 11/25/2015 at 12:06 PM  Between 7am to 6pm - Pager - [phone]  After 6pm go to [email or web address] - password [email or web address]  [facility] [place] [facility]  Office  [phone]  CC: Primary care physician; Carlean JewsHeather E Boscia, NP  Note: This dictation was  prepared with Dragon dictation along with smaller phrase technology. Any transcriptional errors that result from this process are unintentional.

## 2015-11-25 NOTE — Progress Notes (Signed)
ANTICOAGULATION CONSULT NOTE - Initial Consult  Pharmacy Consult for IV Heparin Indication: chest pain/ACS  Allergies  Allergen Reactions  . Penicillins     Patient Measurements: Height: 5\' 8"  (172.7 cm) Weight: 200 lb (90.719 kg) IBW/kg (Calculated) : 68.4 Heparin Dosing Weight: 87  Vital Signs: BP: 98/85 mmHg (03/28 1100) Pulse Rate: 115 (03/28 1100)  Labs:  Recent Labs  11/25/15 0958  HGB 15.3  HCT 45.9  PLT 259  CREATININE 1.45*  TROPONINI 0.21*    Estimated Creatinine Clearance: 59.2 mL/min (by C-G formula based on Cr of 1.45).  APTT pending  Medical History: Past Medical History  Diagnosis Date  . Chest pain   . DU (duodenal ulcer)   . Hypertension   . GAD (generalized anxiety disorder)   . Tachycardia   . Radiculopathy of thoracolumbar region     Medications:  pending  Assessment: Acute chest pain  Goal of Therapy:  Heparin level 0.3-0.7 units/ml Monitor platelets by anticoagulation protocol: Yes   Plan:  Give 4000 units bolus x 1  Run heparin at 1000 units per hour  Sherilyn CooterHenry A Mackay Hanauer 11/25/2015,11:27 AM

## 2015-11-25 NOTE — ED Notes (Signed)
Admitting MD in to see patient. She has D/C'd lovenox and heparin. Patient did not receive any heparin.

## 2015-11-26 DIAGNOSIS — K219 Gastro-esophageal reflux disease without esophagitis: Secondary | ICD-10-CM | POA: Diagnosis not present

## 2015-11-26 DIAGNOSIS — I10 Essential (primary) hypertension: Secondary | ICD-10-CM | POA: Diagnosis not present

## 2015-11-26 DIAGNOSIS — N179 Acute kidney failure, unspecified: Secondary | ICD-10-CM | POA: Diagnosis not present

## 2015-11-26 LAB — TROPONIN I: Troponin I: 1.59 ng/mL — ABNORMAL HIGH (ref <0.031)

## 2015-11-26 MED ORDER — PANTOPRAZOLE SODIUM 40 MG PO TBEC: 40.0000 mg | DELAYED_RELEASE_TABLET | Freq: Two times a day (BID) | ORAL | Status: DC

## 2015-11-26 NOTE — Care Management (Signed)
Cm assessment for discharge planning. Met with patient at bedside. He lives at home with his brother. He uses a cane, does not drive. Denies issues obtaining medications, copays or transportation. PCP is with [facility]. No needs identified. Case Closed.

## 2015-11-26 NOTE — Discharge Summary (Signed)
[facility] -  at [facility]   PATIENT NAME: Howard Weaver    MR#:  [id number]  DATE OF BIRTH:  12/30/1955  DATE OF ADMISSION:  11/25/2015 ADMITTING PHYSICIAN: Laurier Nancy, MD  DATE OF DISCHARGE: 11/26/2015 12:40 PM  PRIMARY CARE PHYSICIAN: Carlean Jews, NP    ADMISSION DIAGNOSIS:  Elevated troponin I level [R79.89] Chest pain, unspecified chest pain type [R07.9]  DISCHARGE DIAGNOSIS:  Active Problems:   Chest pain   SECONDARY DIAGNOSIS:   Past Medical History  Diagnosis Date  . Chest pain   . DU (duodenal ulcer)   . Hypertension   . GAD (generalized anxiety disorder)   . Tachycardia   . Radiculopathy of thoracolumbar region   . Myocardial infarction [place])     HOSPITAL COURSE:   60 year old male with past medical history significant for hypertension, generalized anxiety disorder, peptic ulcer disease and tachycardia presents to the hospital secondary to midsternal chest pain and elevated troponin.  #1 chest pain-secondary to GERD -Due to elevated troponin, he was admitted to telemetry floor and seen by cardiology. -Troponins were elevated but plateaued. -Had cardiac catheterization done-30% mid LAD and 40% marginal lesion noted. No other obstructive coronary disease seen. Normal left ventricular systolic function noted. -Patient has peptic ulcer disease and GERD symptoms. So his Protonix has been increased to twice a day and he is being discharged in a stable condition  #2 hypertension-blood pressure was low normal in the hospital. -Benazepril, Imdur and metoprolol were continued. Also on hydrochlorothiazide and clonidine which were continued. -Hydralazine has been discontinued at discharge.  #3 depression and anxiety-on Cymbalta and BuSpar  #4 tachycardia-had an episode of sinus tach in the hospital with heart rate went up to 180s that improved spontaneously with oral metoprolol that he takes at home. -Outpatient cardiology  follow up. With further ambulation no tachycardia has been observed at this time.  Patient is being discharged today in a stable condition.  DISCHARGE CONDITIONS:   Stable  CONSULTS OBTAINED:   Cardiology consultation by Dr. De Burrs  DRUG ALLERGIES:   Allergies  Allergen Reactions  . Penicillins Anaphylaxis    Has patient had a PCN reaction causing immediate rash, facial/tongue/throat swelling, SOB or lightheadedness with hypotension: Yes Has patient had a PCN reaction causing severe rash involving mucus membranes or skin necrosis: Yes Has patient had a PCN reaction that required hospitalization Yes Has patient had a PCN reaction occurring within the last 10 years: No If all of the above answers are "NO", then may proceed with Cephalosporin use.   . Ibuprofen Other (See Comments)    Gi upset    DISCHARGE MEDICATIONS:   Discharge Medication List as of 11/26/2015 11:58 AM    CONTINUE these medications which have CHANGED   Details  pantoprazole (PROTONIX) 40 MG tablet Take 1 tablet (40 mg total) by mouth 2 (two) times daily., Starting 11/26/2015, Until Discontinued, Print      CONTINUE these medications which have NOT CHANGED   Details  benazepril (LOTENSIN) 20 MG tablet Take 1 tablet by mouth daily., Starting 11/01/2015, Until Discontinued, Historical Med    busPIRone (BUSPAR) 7.5 MG tablet Take 7.5 mg by mouth 2 (two) times daily., Until Discontinued, Historical Med    cloNIDine (CATAPRES) 0.3 MG tablet Take 0.3 mg by mouth 2 (two) times daily., Until Discontinued, Historical Med    cyclobenzaprine (FLEXERIL) 10 MG tablet Take 10 mg by mouth 3 (three) times daily as needed for muscle spasms., Until  Discontinued, Historical Med    DULoxetine (CYMBALTA) 30 MG capsule Take 1 capsule by mouth daily., Starting 11/20/2015, Until Discontinued, Historical Med    hydrochlorothiazide (HYDRODIURIL) 25 MG tablet Take 25 mg by mouth daily., Until Discontinued, Historical Med     HYDROcodone-acetaminophen (NORCO/VICODIN) 5-325 MG tablet Take 1 tablet by mouth 2 (two) times daily as needed., Starting 11/06/2015, Until Discontinued, Historical Med    isosorbide mononitrate (IMDUR) 30 MG 24 hr tablet Take 30 mg by mouth daily., Until Discontinued, Historical Med    metoprolol (LOPRESSOR) 100 MG tablet Take 100 mg by mouth 2 (two) times daily., Until Discontinued, Historical Med    rosuvastatin (CRESTOR) 20 MG tablet Take 20 mg by mouth daily., Until Discontinued, Historical Med    traMADol (ULTRAM) 50 MG tablet Take 1 tablet by mouth as needed., Starting 11/15/2015, Until Discontinued, Historical Med      STOP taking these medications     hydrALAZINE (APRESOLINE) 100 MG tablet          DISCHARGE INSTRUCTIONS:   1. PCP f/u in 1-2 weeks 2. Cardiology f/u in 2 weeks  If you experience worsening of your admission symptoms, develop shortness of breath, life threatening emergency, suicidal or homicidal thoughts you must seek medical attention immediately by calling 911 or calling your MD immediately  if symptoms less severe.  You Must read complete instructions/literature along with all the possible adverse reactions/side effects for all the Medicines you take and that have been prescribed to you. Take any new Medicines after you have completely understood and accept all the possible adverse reactions/side effects.   Please note  You were cared for by a hospitalist during your hospital stay. If you have any questions about your discharge medications or the care you received while you were in the hospital after you are discharged, you can call the unit and asked to speak with the hospitalist on call if the hospitalist that took care of you is not available. Once you are discharged, your primary care physician will handle any further medical issues. Please note that NO REFILLS for any discharge medications will be authorized once you are discharged, as it is imperative that  you return to your primary care physician (or establish a relationship with a primary care physician if you do not have one) for your aftercare needs so that they can reassess your need for medications and monitor your lab values.    Today   CHIEF COMPLAINT:   Chief Complaint  Patient presents with  . Chest Pain    VITAL SIGNS:  Blood pressure 115/82, pulse 67, temperature 98 F (36.7 C), temperature source Oral, resp. rate 20, height 5\' 8"  (1.727 m), weight 95.255 kg (210 lb), SpO2 98 %.  I/O:   Intake/Output Summary (Last 24 hours) at 11/26/15 1543 Last data filed at 11/26/15 0830  Gross per 24 hour  Intake 948.33 ml  Output      0 ml  Net 948.33 ml    PHYSICAL EXAMINATION:   Physical Exam  GENERAL:  60 y.o.-year-old patient lying in the bed with no acute distress.  EYES: Pupils equal, round, reactive to light and accommodation. No scleral icterus. Extraocular muscles intact.  HEENT: Head atraumatic, normocephalic. Oropharynx and nasopharynx clear.  NECK:  Supple, no jugular venous distention. No thyroid enlargement, no tenderness.  LUNGS: Normal breath sounds bilaterally, no wheezing, rales,rhonchi or crepitation. No use of accessory muscles of respiration.  CARDIOVASCULAR: S1, S2 normal. No murmurs, rubs, or gallops.  ABDOMEN: Soft, non-tender, non-distended. Bowel sounds present. No organomegaly or mass.  EXTREMITIES: No pedal edema, cyanosis, or clubbing.  NEUROLOGIC: Cranial nerves II through XII are intact. Muscle strength 5/5 in all extremities. Sensation intact. Gait not checked.  PSYCHIATRIC: The patient is alert and oriented x 3.  SKIN: No obvious rash, lesion, or ulcer.   DATA REVIEW:   CBC  Recent Labs Lab 11/25/15 0958  WBC 8.1  HGB 15.3  HCT 45.9  PLT 259    Chemistries   Recent Labs Lab 11/25/15 0958  NA 134*  K 3.5  CL 105  CO2 18*  GLUCOSE 159*  BUN 23*  CREATININE 1.45*  CALCIUM 9.4  AST 28  ALT 18  ALKPHOS 50  BILITOT 0.9     Cardiac Enzymes  Recent Labs Lab 11/26/15 0007  TROPONINI 1.59*    Microbiology Results  Results for orders placed or performed in visit on 02/09/14  Wound culture     Status: None   Collection Time: 02/09/14  1:57 PM  Result Value Ref Range Status   Micro Text Report   Final       SOURCE: BACK    ORGANISM 1                MODERATE GROWTH AEROBIC GRAM POS ROD,NO FURTHER ID   ORGANISM 2                MODERATE GROWTH AEROBIC GRAM POS ROD,NO FURTHER ID   COMMENT                   2 DIFFERENT COLONY MORPHOLOGIES   COMMENT                   NO ANAEROBES ISOLATED IN 4 DAYS   GRAM STAIN                FEW WHITE BLOOD CELLS   GRAM STAIN                FEW GRAM POSITIVE COCCI IN PAIRS   ANTIBIOTIC                                                      Culture, blood (single)     Status: None   Collection Time: 02/09/14  2:13 PM  Result Value Ref Range Status   Micro Text Report   Final       COMMENT                   NO GROWTH AEROBICALLY/ANAEROBICALLY IN 5 DAYS   ANTIBIOTIC                                                      Culture, blood (single)     Status: None   Collection Time: 02/09/14  2:23 PM  Result Value Ref Range Status   Micro Text Report   Final       COMMENT                   NO GROWTH AEROBICALLY/ANAEROBICALLY IN 5 DAYS   ANTIBIOTIC  Urine culture     Status: None   Collection Time: 02/09/14  4:34 PM  Result Value Ref Range Status   Micro Text Report   Final       SOURCE: CLEAN CATCH    COMMENT                   MIXED BACTERIAL ORGANISMS   COMMENT                   RESULTS SUGGESTIVE OF CONTAMINATION   ANTIBIOTIC                                                      Wound culture     Status: None   Collection Time: 02/10/14  9:51 AM  Result Value Ref Range Status   Micro Text Report   Final       SOURCE: ABSCESS    ORGANISM 1                HEAVY GROWTH AEROBIC GRAM POS ROD,NO FURTHER  ID   ORGANISM 2                HEAVY GROWTH AEROBIC GRAM POS ROD,NO FURTHER ID   ORGANISM 3                MODERATE GROWTH PEPTONIPHILUS SPECIES   COMMENT                   2 DIFFERENT COLONY MORPHOLOGIES   GRAM STAIN                FEW WHITE BLOOD CELLS   GRAM STAIN                MANY GRAM POSITIVE COCCI IN PAIRS   GRAM STAIN                RARE GRAM NEGATIVE ROD   ANTIBIOTIC                    ORG#1    ORG#2    ORG#3     BETA-LACTAMASE                                  NEGATIVE      RADIOLOGY:  Dg Chest Port 1 View  11/25/2015  CLINICAL DATA:  Intermittent chest pain for 2 days, shortness of Breath EXAM: PORTABLE CHEST 1 VIEW COMPARISON:  05/01/2014 FINDINGS: Cardiomediastinal silhouette is stable. No acute infiltrate or pleural effusion. No pulmonary edema. Bony thorax is unremarkable. IMPRESSION: No active disease. Electronically Signed   By: Natasha MeadLiviu  Pop M.D.   On: 11/25/2015 10:34    EKG:   Orders placed or performed during the hospital encounter of 11/25/15  . ED EKG  . ED EKG      Management plans discussed with the patient, family and they are in agreement.  CODE STATUS:     Code Status Orders        Start     Ordered   11/25/15 1137  Full code   Continuous     11/25/15 1139    Code Status History    Date Active Date Inactive Code Status Order ID Comments User Context  This patient has a current code status but no historical code status.      TOTAL TIME TAKING CARE OF THIS PATIENT: 37 minutes.    Enid Baas M.D on 11/26/2015 at 3:43 PM  Between 7am to 6pm - Pager - [phone]  After 6pm go to [email or web address] - password [email or web address] [facility]  [facility]  Office  [phone]  CC: Primary care physician; Carlean Jews, NP

## 2015-11-26 NOTE — Progress Notes (Signed)
  SUBJECTIVE: Mr. Leda GauzeZilempe is doing well this morning without any chest pain or pressure or shortness of breath.   Filed Vitals:   11/25/15 2107 11/26/15 0118 11/26/15 0355 11/26/15 0819  BP: 156/103 97/69 101/73 138/99  Pulse: 150 63 66 68  Temp:   98 F (36.7 C)   TempSrc:      Resp:   20   Height:      Weight:      SpO2:   98%     Intake/Output Summary (Last 24 hours) at 11/26/15 1045 Last data filed at 11/26/15 0830  Gross per 24 hour  Intake 948.33 ml  Output      0 ml  Net 948.33 ml    LABS: Basic Metabolic Panel:  Recent Labs  16/05/9602/28/17 0958  NA 134*  K 3.5  CL 105  CO2 18*  GLUCOSE 159*  BUN 23*  CREATININE 1.45*  CALCIUM 9.4   Liver Function Tests:  Recent Labs  11/25/15 0958  AST 28  ALT 18  ALKPHOS 50  BILITOT 0.9  PROT 7.7  ALBUMIN 4.0   No results for input(s): LIPASE, AMYLASE in the last 72 hours. CBC:  Recent Labs  11/25/15 0958  WBC 8.1  HGB 15.3  HCT 45.9  MCV 82.3  PLT 259   Cardiac Enzymes:  Recent Labs  11/25/15 0958 11/25/15 1821 11/26/15 0007  TROPONINI 0.21* 1.41* 1.59*   BNP: Invalid input(s): POCBNP D-Dimer: No results for input(s): DDIMER in the last 72 hours. Hemoglobin A1C: No results for input(s): HGBA1C in the last 72 hours. Fasting Lipid Panel: No results for input(s): CHOL, HDL, LDLCALC, TRIG, CHOLHDL, LDLDIRECT in the last 72 hours. Thyroid Function Tests: No results for input(s): TSH, T4TOTAL, T3FREE, THYROIDAB in the last 72 hours.  Invalid input(s): FREET3 Anemia Panel: No results for input(s): VITAMINB12, FOLATE, FERRITIN, TIBC, IRON, RETICCTPCT in the last 72 hours.   PHYSICAL EXAM General: Well developed, well nourished, in no acute distress HEENT:  Normocephalic and atramatic Neck:  No JVD.  Lungs: Clear bilaterally to auscultation and percussion. Heart: HRRR . Normal S1 and S2 without gallops or murmurs.  Abdomen: Bowel sounds are positive, abdomen soft and non-tender  Msk:  Back  normal, normal gait. Normal strength and tone for age. Extremities: No clubbing, cyanosis or edema.   Neuro: Alert and oriented X 3. Psych:  Good affect, responds appropriately  TELEMETRY: Sinus rhythm 66 bpm  ASSESSMENT AND PLAN: Non-STEMI. Patient developed chest pain and had an elevated troponin level. He was taken to the cath lab yesterday evening with the following findings: There is no significant coronary artery disease mild disease in the left circumflex and mid LAD. There is moderate tortuosity in the right coronary and circumflex without any obstructive disease and normal left reticular systolic function. Cause of chest pain is most likely acid reflux. Advise PPI. Other causes of chest pain can be looked at. The right groin is without tenderness bleeding or drainage or bruit. Dressing is intact.   The patient then be discharged and has a follow-up appointment already scheduled in the office for next week. He will keep that. He should go home on Protonix 40 mg twice daily.      Active Problems:   Chest pain    Berton BonJanine Damara Klunder, NP 11/26/2015 10:45 AM

## 2015-11-26 NOTE — Progress Notes (Signed)
Patient d/c'd home. Education provided, no questions at this time. Patient picked up by son. Telemetry removed. Nayah Lukens R [place] 

## 2015-11-30 ENCOUNTER — Encounter: Payer: Self-pay | Admitting: Cardiovascular Disease

## 2015-12-02 DIAGNOSIS — F411 Generalized anxiety disorder: Secondary | ICD-10-CM | POA: Diagnosis not present

## 2015-12-02 DIAGNOSIS — I2510 Atherosclerotic heart disease of native coronary artery without angina pectoris: Secondary | ICD-10-CM | POA: Diagnosis not present

## 2015-12-02 DIAGNOSIS — I2584 Coronary atherosclerosis due to calcified coronary lesion: Secondary | ICD-10-CM | POA: Diagnosis not present

## 2015-12-02 DIAGNOSIS — E785 Hyperlipidemia, unspecified: Secondary | ICD-10-CM | POA: Diagnosis not present

## 2015-12-02 DIAGNOSIS — F331 Major depressive disorder, recurrent, moderate: Secondary | ICD-10-CM | POA: Diagnosis not present

## 2015-12-02 DIAGNOSIS — K279 Peptic ulcer, site unspecified, unspecified as acute or chronic, without hemorrhage or perforation: Secondary | ICD-10-CM | POA: Diagnosis not present

## 2015-12-02 DIAGNOSIS — I10 Essential (primary) hypertension: Secondary | ICD-10-CM | POA: Diagnosis not present

## 2015-12-03 ENCOUNTER — Telehealth: Payer: Self-pay | Admitting: Gastroenterology

## 2015-12-03 NOTE — Telephone Encounter (Signed)
Left voice message for patient to call and schedule with Dr. Servando SnareWohl for hospital follow up, peptic ulcers

## 2015-12-09 ENCOUNTER — Telehealth: Payer: Self-pay | Admitting: Gastroenterology

## 2015-12-09 NOTE — Telephone Encounter (Signed)
Attempted to call patient to schedule appointment with Dr. Servando SnareWohl for hospital follow up, peptic ulcers, consider EGD. Mailbox was full and cannot leave message.

## 2015-12-17 ENCOUNTER — Telehealth: Payer: Self-pay | Admitting: Gastroenterology

## 2015-12-17 NOTE — Telephone Encounter (Signed)
Attempted several times to call patient to make appointment with Dr. Servando SnareWohl for hospital follow up, peptic ulcers possible EGD. 4/5 and 4/11 mailbox full and 4/13 not a working number

## 2016-01-02 DIAGNOSIS — F331 Major depressive disorder, recurrent, moderate: Secondary | ICD-10-CM | POA: Diagnosis not present

## 2016-01-02 DIAGNOSIS — M545 Low back pain: Secondary | ICD-10-CM | POA: Diagnosis not present

## 2016-01-02 DIAGNOSIS — Z0001 Encounter for general adult medical examination with abnormal findings: Secondary | ICD-10-CM | POA: Diagnosis not present

## 2016-01-02 DIAGNOSIS — E782 Mixed hyperlipidemia: Secondary | ICD-10-CM | POA: Diagnosis not present

## 2016-01-02 DIAGNOSIS — K279 Peptic ulcer, site unspecified, unspecified as acute or chronic, without hemorrhage or perforation: Secondary | ICD-10-CM | POA: Diagnosis not present

## 2016-01-02 DIAGNOSIS — F411 Generalized anxiety disorder: Secondary | ICD-10-CM | POA: Diagnosis not present

## 2016-01-02 DIAGNOSIS — I2510 Atherosclerotic heart disease of native coronary artery without angina pectoris: Secondary | ICD-10-CM | POA: Diagnosis not present

## 2016-01-02 DIAGNOSIS — I10 Essential (primary) hypertension: Secondary | ICD-10-CM | POA: Diagnosis not present

## 2016-04-01 DIAGNOSIS — F411 Generalized anxiety disorder: Secondary | ICD-10-CM | POA: Diagnosis not present

## 2016-04-01 DIAGNOSIS — M545 Low back pain: Secondary | ICD-10-CM | POA: Diagnosis not present

## 2016-04-01 DIAGNOSIS — K279 Peptic ulcer, site unspecified, unspecified as acute or chronic, without hemorrhage or perforation: Secondary | ICD-10-CM | POA: Diagnosis not present

## 2016-04-01 DIAGNOSIS — F331 Major depressive disorder, recurrent, moderate: Secondary | ICD-10-CM | POA: Diagnosis not present

## 2016-06-03 DIAGNOSIS — I10 Essential (primary) hypertension: Secondary | ICD-10-CM | POA: Diagnosis not present

## 2016-06-03 DIAGNOSIS — I2510 Atherosclerotic heart disease of native coronary artery without angina pectoris: Secondary | ICD-10-CM | POA: Diagnosis not present

## 2016-06-03 DIAGNOSIS — E782 Mixed hyperlipidemia: Secondary | ICD-10-CM | POA: Diagnosis not present

## 2016-07-05 DIAGNOSIS — K279 Peptic ulcer, site unspecified, unspecified as acute or chronic, without hemorrhage or perforation: Secondary | ICD-10-CM | POA: Diagnosis not present

## 2016-07-05 DIAGNOSIS — Z23 Encounter for immunization: Secondary | ICD-10-CM | POA: Diagnosis not present

## 2016-07-05 DIAGNOSIS — F331 Major depressive disorder, recurrent, moderate: Secondary | ICD-10-CM | POA: Diagnosis not present

## 2016-07-05 DIAGNOSIS — I2584 Coronary atherosclerosis due to calcified coronary lesion: Secondary | ICD-10-CM | POA: Diagnosis not present

## 2016-07-05 DIAGNOSIS — M545 Low back pain: Secondary | ICD-10-CM | POA: Diagnosis not present

## 2016-07-05 DIAGNOSIS — F411 Generalized anxiety disorder: Secondary | ICD-10-CM | POA: Diagnosis not present

## 2016-07-05 DIAGNOSIS — I10 Essential (primary) hypertension: Secondary | ICD-10-CM | POA: Diagnosis not present

## 2016-07-06 DIAGNOSIS — I351 Nonrheumatic aortic (valve) insufficiency: Secondary | ICD-10-CM | POA: Diagnosis not present

## 2016-07-06 DIAGNOSIS — I2510 Atherosclerotic heart disease of native coronary artery without angina pectoris: Secondary | ICD-10-CM | POA: Diagnosis not present

## 2016-07-06 DIAGNOSIS — I10 Essential (primary) hypertension: Secondary | ICD-10-CM | POA: Diagnosis not present

## 2016-07-06 DIAGNOSIS — I340 Nonrheumatic mitral (valve) insufficiency: Secondary | ICD-10-CM | POA: Diagnosis not present

## 2016-07-06 DIAGNOSIS — E782 Mixed hyperlipidemia: Secondary | ICD-10-CM | POA: Diagnosis not present

## 2016-08-30 DIAGNOSIS — K254 Chronic or unspecified gastric ulcer with hemorrhage: Secondary | ICD-10-CM

## 2016-08-30 HISTORY — DX: Chronic or unspecified gastric ulcer with hemorrhage: K25.4

## 2016-10-05 DIAGNOSIS — F331 Major depressive disorder, recurrent, moderate: Secondary | ICD-10-CM | POA: Diagnosis not present

## 2016-10-05 DIAGNOSIS — F411 Generalized anxiety disorder: Secondary | ICD-10-CM | POA: Diagnosis not present

## 2016-10-05 DIAGNOSIS — I2584 Coronary atherosclerosis due to calcified coronary lesion: Secondary | ICD-10-CM | POA: Diagnosis not present

## 2016-10-05 DIAGNOSIS — I10 Essential (primary) hypertension: Secondary | ICD-10-CM | POA: Diagnosis not present

## 2016-10-05 DIAGNOSIS — E782 Mixed hyperlipidemia: Secondary | ICD-10-CM | POA: Diagnosis not present

## 2016-10-05 DIAGNOSIS — M545 Low back pain: Secondary | ICD-10-CM | POA: Diagnosis not present

## 2016-11-04 DIAGNOSIS — I10 Essential (primary) hypertension: Secondary | ICD-10-CM | POA: Diagnosis not present

## 2016-11-04 DIAGNOSIS — R0602 Shortness of breath: Secondary | ICD-10-CM | POA: Diagnosis not present

## 2016-11-04 DIAGNOSIS — E782 Mixed hyperlipidemia: Secondary | ICD-10-CM | POA: Diagnosis not present

## 2016-11-04 DIAGNOSIS — I2510 Atherosclerotic heart disease of native coronary artery without angina pectoris: Secondary | ICD-10-CM | POA: Diagnosis not present

## 2016-12-02 DIAGNOSIS — E782 Mixed hyperlipidemia: Secondary | ICD-10-CM | POA: Diagnosis not present

## 2016-12-02 DIAGNOSIS — I2510 Atherosclerotic heart disease of native coronary artery without angina pectoris: Secondary | ICD-10-CM | POA: Diagnosis not present

## 2016-12-02 DIAGNOSIS — I10 Essential (primary) hypertension: Secondary | ICD-10-CM | POA: Diagnosis not present

## 2017-01-21 ENCOUNTER — Emergency Department: Payer: PPO

## 2017-01-21 ENCOUNTER — Inpatient Hospital Stay
Admission: EM | Admit: 2017-01-21 | Discharge: 2017-01-23 | DRG: 305 | Disposition: A | Discharge status: Home / Self Care | Payer: PPO | Attending: Internal Medicine | Admitting: Internal Medicine

## 2017-01-21 ENCOUNTER — Encounter: Payer: Self-pay | Admitting: Emergency Medicine

## 2017-01-21 DIAGNOSIS — Z880 Allergy status to penicillin: Secondary | ICD-10-CM | POA: Diagnosis not present

## 2017-01-21 DIAGNOSIS — I10 Essential (primary) hypertension: Secondary | ICD-10-CM | POA: Diagnosis present

## 2017-01-21 DIAGNOSIS — I160 Hypertensive urgency: Secondary | ICD-10-CM | POA: Diagnosis not present

## 2017-01-21 DIAGNOSIS — I2510 Atherosclerotic heart disease of native coronary artery without angina pectoris: Secondary | ICD-10-CM | POA: Diagnosis not present

## 2017-01-21 DIAGNOSIS — F329 Major depressive disorder, single episode, unspecified: Secondary | ICD-10-CM | POA: Diagnosis present

## 2017-01-21 DIAGNOSIS — E785 Hyperlipidemia, unspecified: Secondary | ICD-10-CM | POA: Diagnosis present

## 2017-01-21 DIAGNOSIS — Z888 Allergy status to other drugs, medicaments and biological substances status: Secondary | ICD-10-CM | POA: Diagnosis not present

## 2017-01-21 DIAGNOSIS — Z87891 Personal history of nicotine dependence: Secondary | ICD-10-CM | POA: Diagnosis not present

## 2017-01-21 DIAGNOSIS — F411 Generalized anxiety disorder: Secondary | ICD-10-CM | POA: Diagnosis not present

## 2017-01-21 DIAGNOSIS — I471 Supraventricular tachycardia: Secondary | ICD-10-CM | POA: Diagnosis present

## 2017-01-21 DIAGNOSIS — N183 Chronic kidney disease, stage 3 unspecified: Secondary | ICD-10-CM | POA: Diagnosis present

## 2017-01-21 DIAGNOSIS — Z8249 Family history of ischemic heart disease and other diseases of the circulatory system: Secondary | ICD-10-CM

## 2017-01-21 DIAGNOSIS — I2583 Coronary atherosclerosis due to lipid rich plaque: Secondary | ICD-10-CM | POA: Diagnosis present

## 2017-01-21 DIAGNOSIS — Z79899 Other long term (current) drug therapy: Secondary | ICD-10-CM

## 2017-01-21 DIAGNOSIS — R079 Chest pain, unspecified: Secondary | ICD-10-CM | POA: Diagnosis not present

## 2017-01-21 DIAGNOSIS — I129 Hypertensive chronic kidney disease with stage 1 through stage 4 chronic kidney disease, or unspecified chronic kidney disease: Secondary | ICD-10-CM | POA: Diagnosis not present

## 2017-01-21 DIAGNOSIS — I248 Other forms of acute ischemic heart disease: Secondary | ICD-10-CM | POA: Diagnosis present

## 2017-01-21 DIAGNOSIS — I252 Old myocardial infarction: Secondary | ICD-10-CM

## 2017-01-21 HISTORY — DX: Atherosclerotic heart disease of native coronary artery without angina pectoris: I25.10

## 2017-01-21 HISTORY — DX: Supraventricular tachycardia: I47.1

## 2017-01-21 HISTORY — DX: Supraventricular tachycardia, unspecified: I47.10

## 2017-01-21 LAB — CBC WITH DIFFERENTIAL/PLATELET
BASOS ABS: 0.1 10*3/uL (ref 0–0.1)
BASOS PCT: 1 %
EOS PCT: 1 %
Eosinophils Absolute: 0.1 10*3/uL (ref 0–0.7)
HCT: 49 % (ref 40.0–52.0)
Hemoglobin: 16.7 g/dL (ref 13.0–18.0)
LYMPHS ABS: 1.3 10*3/uL (ref 1.0–3.6)
Lymphocytes Relative: 12 %
MCH: 29.2 pg (ref 26.0–34.0)
MCHC: 34.1 g/dL (ref 32.0–36.0)
MCV: 85.6 fL (ref 80.0–100.0)
Monocytes Absolute: 0.9 10*3/uL (ref 0.2–1.0)
Monocytes Relative: 9 %
Neutro Abs: 8.3 10*3/uL — ABNORMAL HIGH (ref 1.4–6.5)
Neutrophils Relative %: 77 %
PLATELETS: 225 10*3/uL (ref 150–440)
RBC: 5.72 MIL/uL (ref 4.40–5.90)
RDW: 14.7 % — ABNORMAL HIGH (ref 11.5–14.5)
WBC: 10.7 10*3/uL — AB (ref 3.8–10.6)

## 2017-01-21 LAB — BASIC METABOLIC PANEL
Anion gap: 11 (ref 5–15)
BUN: 23 mg/dL — AB (ref 6–20)
CALCIUM: 10.6 mg/dL — AB (ref 8.9–10.3)
CO2: 23 mmol/L (ref 22–32)
Chloride: 107 mmol/L (ref 101–111)
Creatinine, Ser: 1.47 mg/dL — ABNORMAL HIGH (ref 0.61–1.24)
GFR calc Af Amer: 58 mL/min — ABNORMAL LOW (ref >60)
GFR, EST NON AFRICAN AMERICAN: 50 mL/min — AB (ref >60)
GLUCOSE: 120 mg/dL — AB (ref 65–99)
Potassium: 4.2 mmol/L (ref 3.5–5.1)
Sodium: 141 mmol/L (ref 135–145)

## 2017-01-21 LAB — HEPATIC FUNCTION PANEL
ALT: 19 U/L (ref 17–63)
AST: 27 U/L (ref 15–41)
Albumin: 4.6 g/dL (ref 3.5–5.0)
Alkaline Phosphatase: 63 U/L (ref 38–126)
Total Bilirubin: 1 mg/dL (ref 0.3–1.2)
Total Protein: 8.6 g/dL — ABNORMAL HIGH (ref 6.5–8.1)

## 2017-01-21 LAB — GLUCOSE, CAPILLARY: Glucose-Capillary: 135 mg/dL — ABNORMAL HIGH (ref 65–99)

## 2017-01-21 LAB — TROPONIN I: Troponin I: 0.06 ng/mL (ref <0.03)

## 2017-01-21 LAB — TSH: TSH: 4.58 u[IU]/mL — AB (ref 0.350–4.500)

## 2017-01-21 LAB — MRSA PCR SCREENING: MRSA by PCR: NEGATIVE

## 2017-01-21 LAB — BRAIN NATRIURETIC PEPTIDE: B NATRIURETIC PEPTIDE 5: 248 pg/mL — AB (ref 0.0–100.0)

## 2017-01-21 MED ORDER — NITROGLYCERIN IN D5W 200-5 MCG/ML-% IV SOLN
0.0000 ug/min | Freq: Once | INTRAVENOUS | Status: AC
  Administered 2017-01-21: 5 ug/min via INTRAVENOUS
  Filled 2017-01-21 (×2): qty 250

## 2017-01-21 MED ORDER — ISOSORBIDE MONONITRATE ER 30 MG PO TB24
30.0000 mg | ORAL_TABLET | Freq: Every day | ORAL | Status: DC
  Administered 2017-01-22 – 2017-01-23 (×2): 30 mg via ORAL
  Filled 2017-01-21 (×2): qty 1

## 2017-01-21 MED ORDER — METOPROLOL TARTRATE 100 MG PO TABS
100.0000 mg | ORAL_TABLET | Freq: Two times a day (BID) | ORAL | Status: DC
  Administered 2017-01-21 – 2017-01-23 (×4): 100 mg via ORAL
  Filled 2017-01-21 (×2): qty 2
  Filled 2017-01-21 (×2): qty 1

## 2017-01-21 MED ORDER — NICARDIPINE HCL IN NACL 20-0.86 MG/200ML-% IV SOLN: 3.0000 mg/h | INTRAVENOUS | Status: DC

## 2017-01-21 MED ORDER — ONDANSETRON HCL 4 MG/2ML IJ SOLN: 4.0000 mg | Freq: Four times a day (QID) | INTRAMUSCULAR | Status: DC | PRN

## 2017-01-21 MED ORDER — ACETAMINOPHEN 650 MG RE SUPP: 650.0000 mg | Freq: Four times a day (QID) | RECTAL | Status: DC | PRN

## 2017-01-21 MED ORDER — ENOXAPARIN SODIUM 40 MG/0.4ML ~~LOC~~ SOLN
40.0000 mg | SUBCUTANEOUS | Status: DC
  Administered 2017-01-21 – 2017-01-22 (×2): 40 mg via SUBCUTANEOUS
  Filled 2017-01-21 (×2): qty 0.4

## 2017-01-21 MED ORDER — PANTOPRAZOLE SODIUM 40 MG PO TBEC
40.0000 mg | DELAYED_RELEASE_TABLET | Freq: Two times a day (BID) | ORAL | Status: DC
  Administered 2017-01-21 – 2017-01-23 (×4): 40 mg via ORAL
  Filled 2017-01-21 (×4): qty 1

## 2017-01-21 MED ORDER — NICARDIPINE HCL IN NACL 20-0.86 MG/200ML-% IV SOLN
INTRAVENOUS | Status: AC
  Filled 2017-01-21: qty 200

## 2017-01-21 MED ORDER — ADENOSINE 12 MG/4ML IV SOLN
INTRAVENOUS | Status: AC
  Administered 2017-01-21: 6 mg via INTRAVENOUS
  Filled 2017-01-21: qty 4

## 2017-01-21 MED ORDER — BENAZEPRIL HCL 20 MG PO TABS
20.0000 mg | ORAL_TABLET | Freq: Every day | ORAL | Status: DC
  Administered 2017-01-22 – 2017-01-23 (×2): 20 mg via ORAL
  Filled 2017-01-21 (×2): qty 1

## 2017-01-21 MED ORDER — ADENOSINE 6 MG/2ML IV SOLN
6.0000 mg | Freq: Once | INTRAVENOUS | Status: AC
  Administered 2017-01-21: 6 mg via INTRAVENOUS

## 2017-01-21 MED ORDER — METOPROLOL TARTRATE 5 MG/5ML IV SOLN: 5.0000 mg | INTRAVENOUS | Status: DC | PRN

## 2017-01-21 MED ORDER — ETOMIDATE 2 MG/ML IV SOLN: 5.0000 mg | Freq: Once | INTRAVENOUS | Status: DC

## 2017-01-21 MED ORDER — ADENOSINE 12 MG/4ML IV SOLN
12.0000 mg | Freq: Once | INTRAVENOUS | Status: AC
  Administered 2017-01-21: 12 mg via INTRAVENOUS

## 2017-01-21 MED ORDER — HYDROCODONE-ACETAMINOPHEN 5-325 MG PO TABS: 1.0000 | ORAL_TABLET | Freq: Two times a day (BID) | ORAL | Status: DC | PRN

## 2017-01-21 MED ORDER — SODIUM CHLORIDE 0.9 % IV BOLUS (SEPSIS)
1000.0000 mL | Freq: Once | INTRAVENOUS | Status: AC
  Administered 2017-01-21: 1000 mL via INTRAVENOUS

## 2017-01-21 MED ORDER — DILTIAZEM HCL 100 MG IV SOLR
5.0000 mg/h | INTRAVENOUS | Status: DC
  Administered 2017-01-21: 5 mg/h via INTRAVENOUS
  Filled 2017-01-21 (×3): qty 100

## 2017-01-21 MED ORDER — ETOMIDATE 2 MG/ML IV SOLN
INTRAVENOUS | Status: AC
  Filled 2017-01-21: qty 10

## 2017-01-21 MED ORDER — ASPIRIN 81 MG PO CHEW
162.0000 mg | CHEWABLE_TABLET | Freq: Once | ORAL | Status: AC
  Administered 2017-01-21: 162 mg via ORAL
  Filled 2017-01-21: qty 2

## 2017-01-21 MED ORDER — METOPROLOL TARTRATE 5 MG/5ML IV SOLN
INTRAVENOUS | Status: AC
  Filled 2017-01-21: qty 5

## 2017-01-21 MED ORDER — ACETAMINOPHEN 325 MG PO TABS: 650.0000 mg | ORAL_TABLET | Freq: Four times a day (QID) | ORAL | Status: DC | PRN

## 2017-01-21 MED ORDER — BUSPIRONE HCL 5 MG PO TABS
7.5000 mg | ORAL_TABLET | Freq: Two times a day (BID) | ORAL | Status: DC
  Administered 2017-01-21 – 2017-01-23 (×4): 7.5 mg via ORAL
  Filled 2017-01-21 (×4): qty 2

## 2017-01-21 MED ORDER — DULOXETINE HCL 30 MG PO CPEP
30.0000 mg | ORAL_CAPSULE | Freq: Every day | ORAL | Status: DC
  Administered 2017-01-22 – 2017-01-23 (×2): 30 mg via ORAL
  Filled 2017-01-21 (×2): qty 1

## 2017-01-21 MED ORDER — ONDANSETRON HCL 4 MG PO TABS: 4.0000 mg | ORAL_TABLET | Freq: Four times a day (QID) | ORAL | Status: DC | PRN

## 2017-01-21 MED ORDER — ADENOSINE 6 MG/2ML IV SOLN
INTRAVENOUS | Status: AC
  Administered 2017-01-21: 12 mg via INTRAVENOUS
  Filled 2017-01-21: qty 2

## 2017-01-21 MED ORDER — CLONIDINE HCL 0.1 MG PO TABS
0.3000 mg | ORAL_TABLET | Freq: Two times a day (BID) | ORAL | Status: DC
Start: 2017-01-21 — End: 2017-01-23
  Administered 2017-01-21 – 2017-01-23 (×4): 0.3 mg via ORAL
  Filled 2017-01-21 (×4): qty 3

## 2017-01-21 MED ORDER — ROSUVASTATIN CALCIUM 20 MG PO TABS
20.0000 mg | ORAL_TABLET | Freq: Every day | ORAL | Status: DC
  Administered 2017-01-21 – 2017-01-22 (×2): 20 mg via ORAL
  Filled 2017-01-21 (×2): qty 1

## 2017-01-21 NOTE — H&P (Signed)
[facility] [place] [facility]   PATIENT NAME: Howard ShropshireRichard Weaver    MR#:  [id number]  DATE OF BIRTH:  [birth year]  DATE OF ADMISSION:  01/21/2017  PRIMARY CARE PHYSICIAN: Carlean JewsBoscia, Heather E, NP   REQUESTING/REFERRING PHYSICIAN: Lamont Snowballifenbark, MD  CHIEF COMPLAINT:   Chief Complaint  Patient presents with  . Chest Pain    HISTORY OF PRESENT ILLNESS:  Howard Weaver  is a 61 y.o. male who presents with Chest pain. Patient states chest pain started earlier today, was central, nonradiating, pressure-like, associated with some diaphoresis. He has had chest pain before, he has a history of CAD with prior MI. On arrival to the ED today his heart rate was very fast, and his blood pressure was significantly elevated. He was felt to be in SVT, heart rate corrected with adenosine, symptoms were improved with nitroglycerin when his blood pressure improved as well, but remained elevated. His heart rate then became elevated again. Hospitalists were called for admission  PAST MEDICAL HISTORY:   Past Medical History:  Diagnosis Date  . Chest pain   . DU (duodenal ulcer)   . GAD (generalized anxiety disorder)   . Hypertension   . Myocardial infarction (HCC)   . Radiculopathy of thoracolumbar region   . Tachycardia     PAST SURGICAL HISTORY:   Past Surgical History:  Procedure Laterality Date  . BRAIN SURGERY    . CARDIAC CATHETERIZATION Right 11/25/2015   Procedure: Left Heart Cath and Coronary Angiography;  Surgeon: Laurier NancyShaukat A Khan, MD;  Location: [facility] INVASIVE CV LAB;  Service: Cardiovascular;  Laterality: Right;    SOCIAL HISTORY:   Social History  Substance Use Topics  . Smoking status: Former Smoker    Quit date: 11/25/1975  . Smokeless tobacco: Never Used  . Alcohol use No    FAMILY HISTORY:   Family History  Problem Relation Age of Onset  . Hypertension Mother   . Diabetes Mother   . Depression Mother   . Hypertension Father     DRUG ALLERGIES:    Allergies  Allergen Reactions  . Penicillins Anaphylaxis    Has patient had a PCN reaction causing immediate rash, facial/tongue/throat swelling, SOB or lightheadedness with hypotension: Yes Has patient had a PCN reaction causing severe rash involving mucus membranes or skin necrosis: Yes Has patient had a PCN reaction that required hospitalization Yes Has patient had a PCN reaction occurring within the last 10 years: No If all of the above answers are "NO", then may proceed with Cephalosporin use.   . Ibuprofen Other (See Comments)    Gi upset    MEDICATIONS AT HOME:   Prior to Admission medications   Medication Sig Start Date End Date Taking? Authorizing Provider  benazepril (LOTENSIN) 20 MG tablet Take 1 tablet by mouth daily. 11/01/15   [provider]  busPIRone (BUSPAR) 7.5 MG tablet Take 7.5 mg by mouth 2 (two) times daily.    [provider]  cloNIDine (CATAPRES) 0.3 MG tablet Take 0.3 mg by mouth 2 (two) times daily.    [provider]  cyclobenzaprine (FLEXERIL) 10 MG tablet Take 10 mg by mouth 3 (three) times daily as needed for muscle spasms.    [provider]  DULoxetine (CYMBALTA) 30 MG capsule Take 1 capsule by mouth daily. 11/20/15   [provider]  hydrochlorothiazide (HYDRODIURIL) 25 MG tablet Take 25 mg by mouth daily.    [provider]  HYDROcodone-acetaminophen (NORCO/VICODIN) 5-325 MG tablet Take  1 tablet by mouth 2 (two) times daily as needed. 11/06/15   [provider]  isosorbide mononitrate (IMDUR) 30 MG 24 hr tablet Take 30 mg by mouth daily.    [provider]  metoprolol (LOPRESSOR) 100 MG tablet Take 100 mg by mouth 2 (two) times daily.    [provider]  pantoprazole (PROTONIX) 40 MG tablet Take 1 tablet (40 mg total) by mouth 2 (two) times daily. 11/26/15   Enid Baas, MD  rosuvastatin (CRESTOR) 20 MG tablet Take 20 mg by mouth daily.    [provider]   traMADol (ULTRAM) 50 MG tablet Take 1 tablet by mouth as needed. 11/15/15   [provider]    REVIEW OF SYSTEMS:  Review of Systems  Constitutional: Negative for chills, fever, malaise/fatigue and weight loss.  HENT: Negative for ear pain, hearing loss and tinnitus.   Eyes: Negative for blurred vision, double vision, pain and redness.  Respiratory: Negative for cough, hemoptysis and shortness of breath.   Cardiovascular: Positive for chest pain. Negative for palpitations, orthopnea and leg swelling.  Gastrointestinal: Negative for abdominal pain, constipation, diarrhea, nausea and vomiting.  Genitourinary: Negative for dysuria, frequency and hematuria.  Musculoskeletal: Negative for back pain, joint pain and neck pain.  Skin:       No acne, rash, or lesions  Neurological: Negative for dizziness, tremors, focal weakness and weakness.  Endo/Heme/Allergies: Negative for polydipsia. Does not bruise/bleed easily.  Psychiatric/Behavioral: Negative for depression. The patient is not nervous/anxious and does not have insomnia.      VITAL SIGNS:   Vitals:   01/21/17 2106 01/21/17 2111 01/21/17 2115 01/21/17 2119  BP: (!) 193/145 (!) 206/138 (!) 199/152 (!) 148/117  Pulse: (!) 142 (!) 153 (!) 150 (!) 160  Resp: (!) 27 (!) 23 (!) 32 (!) 28  Temp:      TempSrc:      SpO2: 96% 98% 96% 98%  Weight:      Height:       Wt Readings from Last 3 Encounters:  01/21/17 98.9 kg (218 lb)  11/25/15 95.3 kg (210 lb)    PHYSICAL EXAMINATION:  Physical Exam  Vitals reviewed. Constitutional: He is oriented to person, place, and time. He appears well-developed and well-nourished. No distress.  HENT:  Head: Normocephalic and atraumatic.  Mouth/Throat: Oropharynx is clear and moist.  Eyes: Conjunctivae and EOM are normal. Pupils are equal, round, and reactive to light. No scleral icterus.  Neck: Normal range of motion. Neck supple. No JVD present. No thyromegaly present.  Cardiovascular:  Regular rhythm and intact distal pulses.  Exam reveals no gallop and no friction rub.   No murmur heard. Tachycardic  Respiratory: Effort normal and breath sounds normal. No respiratory distress. He has no wheezes. He has no rales.  GI: Soft. Bowel sounds are normal. He exhibits no distension. There is no tenderness.  Musculoskeletal: Normal range of motion. He exhibits no edema.  No arthritis, no gout  Lymphadenopathy:    He has no cervical adenopathy.  Neurological: He is alert and oriented to person, place, and time. No cranial nerve deficit.  No dysarthria, no aphasia  Skin: Skin is warm and dry. No rash noted. No erythema.  Psychiatric: He has a normal mood and affect. His behavior is normal. Judgment and thought content normal.    LABORATORY PANEL:   CBC  Recent Labs Lab 01/21/17 2028  WBC 10.7*  HGB 16.7  HCT 49.0  PLT 225   ------------------------------------------------------------------------------------------------------------------  Chemistries   Recent Labs Lab 01/21/17 2028  NA 141  K 4.2  CL 107  CO2 23  GLUCOSE 120*  BUN 23*  CREATININE 1.47*  CALCIUM 10.6*  AST 27  ALT 19  ALKPHOS 63  BILITOT 1.0   ------------------------------------------------------------------------------------------------------------------  Cardiac Enzymes  Recent Labs Lab 01/21/17 2028  TROPONINI 0.06*   ------------------------------------------------------------------------------------------------------------------  RADIOLOGY:  Dg Chest 2 View  Result Date: 01/21/2017 CLINICAL DATA:  Chest pain EXAM: CHEST  2 VIEW COMPARISON:  11/25/2015, 05/01/2014 FINDINGS: No acute pulmonary infiltrate, consolidation, or pleural effusion. Cardiomediastinal silhouette stable with tortuous aorta. No pneumothorax. IMPRESSION: No active cardiopulmonary disease. Electronically Signed   By: Jasmine Pang M.D.   On: 01/21/2017 20:52    EKG:   Orders placed or performed during the  hospital encounter of 01/21/17  . EKG 12-Lead  . EKG 12-Lead  . EKG 12-Lead  . EKG 12-Lead  . ED EKG  . ED EKG  . EKG 12-Lead  . EKG 12-Lead  . ED EKG  . ED EKG    IMPRESSION AND PLAN:  Principal Problem:   Chest pain - unclear etiology at this time. Possibly primary ACS, the patient's troponin is not very significantly elevated. Very possibly significantly exacerbated by his accelerated hypertension. We will trend his cardiac enzymes, get an echocardiogram and cardiology consult the morning. We will control his heart rate and blood pressure Active Problems:   Accelerated hypertension - Cardizem drip for blood pressure control, also for heart rate control.   CAD (coronary artery disease) - continue home meds, other workup as above   GAD (generalized anxiety disorder) - continue home meds   CKD (chronic kidney disease), stage III - stable, avoid nephrotoxins and monitor  All the records are reviewed and case discussed with ED provider. Management plans discussed with the patient and/or family.  DVT PROPHYLAXIS: SubQ lovenox  GI PROPHYLAXIS: None  ADMISSION STATUS: Inpatient  CODE STATUS: Full Code Status History    Date Active Date Inactive Code Status Order ID Comments User Context   11/25/2015 11:39 AM 11/26/2015  3:46 PM Full Code [id number]  Katha Hamming, MD ED      TOTAL TIME TAKING CARE OF THIS PATIENT: 45 minutes.   Lamesha Tibbits FIELDING 01/21/2017, 9:28 PM  [facility]  Office  [phone]  CC: Primary care physician; Carlean Jews, NP  Note:  This document was prepared using Dragon voice recognition software and may include unintentional dictation errors.

## 2017-01-21 NOTE — ED Triage Notes (Signed)
Pt to rm 13 via EMS from home, report chest pain starting 2-3 hours ago, described as pressure, hx MI and CVA, SBP in 200s.  1" nitro paste placed by EMS, pain went from 7/10-2/10.  Pt in NAD at this time.

## 2017-01-21 NOTE — ED Provider Notes (Signed)
[facility] Emergency Department Provider Note  ____________________________________________   First MD Initiated Contact with Patient 01/21/17 2024     (approximate)  I have reviewed the triage vital signs and the nursing notes.   HISTORY  Chief Complaint Chest Pain   HPI Howard Weaver is a 61 y.o. male who comes to the emergency department via EMS for insidious onset substernal pressure-like chest pain shortness of breath and diaphoresis that began about 2 hours prior to arrival. He was sitting on his couch when he noted chest discomfort. He was given a nitroglycerin paste in route which improved his pain from a 10 to a 2 out of 10. After the pain started he began to walk around his house and he felt like exertion made it worse associated with shortness of breath. He's had no fevers or chills. Said no cough. He has no sick contacts. Last year he was admitted to the hospital for which he reports was a heart attack although he was not given any stents. He does not take aspirin or Plavix. He has a history of hypertension for which she is supposed to take 3 antihypertensive medications but he has not taken any of them the past 3 days since he ran out.  11/25/15 Cath:  Procedures   Left Heart Cath and Coronary Angiography  Conclusion    3rd Mrg lesion, 40% stenosed.  Mid LAD lesion, 30% stenosed.   There is no significant coronary artery disease mild disease in the left circumflex and mid LAD. There is moderate tortuosity in the right coronary and circumflex without any obstructive disease and normal left reticular systolic function. Cause of chest pain is most likely acid reflux. Advise PPI and may discharge the patient in the morning. Other causes of chest pain can be looked at such as well developed stone.      Past Medical History:  Diagnosis Date  . CAD (coronary artery disease)   . Chest pain   . DU (duodenal ulcer)   . GAD (generalized  anxiety disorder)   . Hypertension   . Myocardial infarction (HCC)   . Radiculopathy of thoracolumbar region   . SVT (supraventricular tachycardia) (HCC)   . Tachycardia     Patient Active Problem List   Diagnosis Date Noted  . Accelerated hypertension 01/21/2017  . CAD (coronary artery disease) 01/21/2017  . GAD (generalized anxiety disorder) 01/21/2017  . CKD (chronic kidney disease), stage III 01/21/2017  . Chest pain 11/25/2015    Past Surgical History:  Procedure Laterality Date  . BRAIN SURGERY    . CARDIAC CATHETERIZATION Right 11/25/2015   Procedure: Left Heart Cath and Coronary Angiography;  Surgeon: Laurier NancyShaukat A Khan, MD;  Location: [facility] INVASIVE CV LAB;  Service: Cardiovascular;  Laterality: Right;    Prior to Admission medications   Medication Sig Start Date End Date Taking? Authorizing Provider  benazepril (LOTENSIN) 20 MG tablet Take 1 tablet by mouth daily. 11/01/15  Yes [provider]  cloNIDine (CATAPRES) 0.3 MG tablet Take 0.3 mg by mouth 2 (two) times daily.   Yes [provider]  DULoxetine (CYMBALTA) 60 MG capsule Take 1 capsule by mouth daily. 12/06/16  Yes [provider]  hydrochlorothiazide (HYDRODIURIL) 25 MG tablet Take 25 mg by mouth daily.   Yes [provider]  isosorbide mononitrate (IMDUR) 30 MG 24 hr tablet Take 30 mg by mouth daily.   Yes [provider]  metoprolol (LOPRESSOR) 100 MG tablet Take 100 mg by  mouth 2 (two) times daily.   Yes [provider]  pantoprazole (PROTONIX) 40 MG tablet Take 1 tablet (40 mg total) by mouth 2 (two) times daily. 11/26/15  Yes Enid Baas, MD  rosuvastatin (CRESTOR) 40 MG tablet Take 40 mg by mouth daily.    Yes [provider]  traMADol (ULTRAM) 50 MG tablet Take 1 tablet by mouth as needed. 11/15/15  Yes [provider]  busPIRone (BUSPAR) 7.5 MG tablet Take 7.5 mg by mouth 2 (two) times daily.    [provider]    HYDROcodone-acetaminophen (NORCO/VICODIN) 5-325 MG tablet Take 1 tablet by mouth 2 (two) times daily as needed. 11/06/15   [provider]    Allergies Penicillins and Ibuprofen  Family History  Problem Relation Age of Onset  . Hypertension Mother   . Diabetes Mother   . Depression Mother   . Hypertension Father     Social History Social History  Substance Use Topics  . Smoking status: Former Smoker    Quit date: 11/25/1975  . Smokeless tobacco: Never Used  . Alcohol use No    Review of Systems Constitutional: No fever/chills Eyes: No visual changes. ENT: No sore throat. Cardiovascular: Positive chest pain. Respiratory: Positive shortness of breath. Gastrointestinal: No abdominal pain.  No nausea, no vomiting.  No diarrhea.  No constipation. Genitourinary: Negative for dysuria. Musculoskeletal: Negative for back pain. Skin: Negative for rash. Neurological: Negative for headaches, focal weakness or numbness.   ____________________________________________   PHYSICAL EXAM:  VITAL SIGNS: ED Triage Vitals  Enc Vitals Group     BP 01/21/17 2022 (!) 199/151     Pulse Rate 01/21/17 2022 (!) 114     Resp 01/21/17 2022 17     Temp 01/21/17 2022 98.3 F (36.8 C)     Temp Source 01/21/17 2022 Oral     SpO2 01/21/17 2022 98 %     Weight 01/21/17 2023 218 lb (98.9 kg)     Height 01/21/17 2023 5\' 8"  (1.727 m)     Head Circumference --      Peak Flow --      Pain Score 01/21/17 2022 2     Pain Loc --      Pain Edu? --      Excl. in GC? --     Constitutional: Alert and oriented x 4 Appears uncomfortable holding his left chest Eyes: PERRL EOMI. Head: Atraumatic. Nose: No congestion/rhinnorhea. Mouth/Throat: No trismus Neck: No stridor.   Cardiovascular: Tachycardic rate, regular rhythm. Grossly normal heart sounds.  Good peripheral circulation. Respiratory: Normal respiratory effort.  No retractions. Lungs CTAB and moving good air Gastrointestinal: Soft  nontender Musculoskeletal: No lower extremity edema   Neurologic:  Normal speech and language. No gross focal neurologic deficits are appreciated. Skin:  Skin is warm, dry and intact. No rash noted. Psychiatric: Mood and affect are normal. Speech and behavior are normal.    ____________________________________________   DIFFERENTIAL  Hypertensive emergency, acute coronary syndrome, Boerhaave syndrome, pneumothorax, hemothorax, pulmonary embolus ____________________________________________   LABS (all labs ordered are listed, but only abnormal results are displayed)  Labs Reviewed  BASIC METABOLIC PANEL - Abnormal; Notable for the following:       Result Value   Glucose, Bld 120 (*)    BUN 23 (*)    Creatinine, Ser 1.47 (*)    Calcium 10.6 (*)    GFR calc non Af Amer 50 (*)    GFR calc Af Amer 58 (*)  All other components within normal limits  HEPATIC FUNCTION PANEL - Abnormal; Notable for the following:    Total Protein 8.6 (*)    Bilirubin, Direct <0.1 (*)    All other components within normal limits  TROPONIN I - Abnormal; Notable for the following:    Troponin I 0.06 (*)    All other components within normal limits  BRAIN NATRIURETIC PEPTIDE - Abnormal; Notable for the following:    B Natriuretic Peptide 248.0 (*)    All other components within normal limits  CBC WITH DIFFERENTIAL/PLATELET - Abnormal; Notable for the following:    WBC 10.7 (*)    RDW 14.7 (*)    Neutro Abs 8.3 (*)    All other components within normal limits  TSH - Abnormal; Notable for the following:    TSH 4.580 (*)    All other components within normal limits  GLUCOSE, CAPILLARY - Abnormal; Notable for the following:    Glucose-Capillary 135 (*)    All other components within normal limits  BASIC METABOLIC PANEL - Abnormal; Notable for the following:    Glucose, Bld 131 (*)    BUN 22 (*)    Creatinine, Ser 1.28 (*)    GFR calc non Af Amer 59 (*)    All other components within normal  limits  TROPONIN I - Abnormal; Notable for the following:    Troponin I 0.07 (*)    All other components within normal limits  TROPONIN I - Abnormal; Notable for the following:    Troponin I 0.04 (*)    All other components within normal limits  TROPONIN I - Abnormal; Notable for the following:    Troponin I 0.04 (*)    All other components within normal limits  MRSA PCR SCREENING  CBC  URINE DRUG SCREEN, QUALITATIVE ([facility] ONLY)  MAGNESIUM  PHOSPHORUS    Elevated troponin of unclear etiology either secondary to demand her primary ischemia __________________________________________  EKG  ED ECG REPORT I, Merrily Brittle, the attending physician, personally viewed and interpreted this ECG.  Date: 01/22/2017 Rate: 112 Rhythm: Sinus tachycardia QRS Axis: normal Intervals: normal ST/T Wave abnormalities: normal Conduction Disturbances: none Narrative Interpretation: unremarkable  ____________________________________________  RADIOLOGY  Chest x-ray with no acute disease ____________________________________________   PROCEDURES  Procedure(s) performed: yes  Chemical cardioversion performed with 46 mg of adenosine and subsequently 12 mg of adenosine. The patient had a regular narrow complex tachycardia before each cardioversion and subsequently had a narrow complex tachycardia. The tachycardia was not converted  Procedures  Critical Care performed: yes  CRITICAL CARE Performed by: Merrily Brittle   Total critical care time: 45 minutes  Critical care time was exclusive of separately billable procedures and treating other patients.  Critical care was necessary to treat or prevent imminent or life-threatening deterioration.  Critical care was time spent personally by me on the following activities: development of treatment plan with patient and/or surrogate as well as nursing, discussions with consultants, evaluation of patient's response to treatment, examination of  patient, obtaining history from patient or surrogate, ordering and performing treatments and interventions, ordering and review of laboratory studies, ordering and review of radiographic studies, pulse oximetry and re-evaluation of patient's condition.   Observation: no ____________________________________________   INITIAL IMPRESSION / ASSESSMENT AND PLAN / ED COURSE  Pertinent labs & imaging results that were available during my care of the patient were reviewed by me and considered in my medical decision making (see chart for details).  The patient arrives  with concerning chest pain and hypertensive to 200/140. He initially improved with nitroglycerin pace I began him on a nitroglycerin drip. His heart rate steadily began to rise and he reported return of his chest pain so I increased his nitroglycerin drip up from 15 mics a minute to 100 mics and minute. Shortly thereafter he became tachycardic to 167 diaphoretic and began to vomit. EKG was quite fast and was concerning for supraventricular tachycardia so I gave him 6 mg of adenosine with no change in mental milligrams of adenosine with no change. At this point the patient was diaphoretic although still responsive with a blood pressure of 120/108. I then performed a bedside ultrasound which appeared to show a very flat IVC so we stopped nitroglycerin and began to fluid resuscitate and the patient nearly immediately began to improve.     ____________________________________________   FINAL CLINICAL IMPRESSION(S) / ED DIAGNOSES  Final diagnoses:  Hypertension, unspecified type  Chest pain, unspecified type      NEW MEDICATIONS STARTED DURING THIS VISIT:  Current Discharge Medication List       Note:  This document was prepared using Dragon voice recognition software and may include unintentional dictation errors.     Merrily Brittle, MD 01/22/17 1544

## 2017-01-22 ENCOUNTER — Encounter: Payer: Self-pay | Admitting: Adult Health

## 2017-01-22 ENCOUNTER — Inpatient Hospital Stay
Admit: 2017-01-22 | Discharge: 2017-01-22 | Disposition: A | Discharge status: Home / Self Care | Payer: PPO | Attending: Internal Medicine | Admitting: Internal Medicine

## 2017-01-22 DIAGNOSIS — R079 Chest pain, unspecified: Secondary | ICD-10-CM

## 2017-01-22 LAB — BASIC METABOLIC PANEL
Anion gap: 7 (ref 5–15)
BUN: 22 mg/dL — AB (ref 6–20)
CALCIUM: 9.4 mg/dL (ref 8.9–10.3)
CO2: 24 mmol/L (ref 22–32)
CREATININE: 1.28 mg/dL — AB (ref 0.61–1.24)
Chloride: 108 mmol/L (ref 101–111)
GFR calc Af Amer: >60 mL/min (ref >60)
GFR calc non Af Amer: 59 mL/min — ABNORMAL LOW (ref >60)
GLUCOSE: 131 mg/dL — AB (ref 65–99)
POTASSIUM: 4.9 mmol/L (ref 3.5–5.1)
Sodium: 139 mmol/L (ref 135–145)

## 2017-01-22 LAB — URINE DRUG SCREEN, QUALITATIVE (ARMC ONLY)
AMPHETAMINES, UR SCREEN: NOT DETECTED
Barbiturates, Ur Screen: NOT DETECTED
Benzodiazepine, Ur Scrn: NOT DETECTED
Cannabinoid 50 Ng, Ur ~~LOC~~: NOT DETECTED
Cocaine Metabolite,Ur ~~LOC~~: NOT DETECTED
MDMA (ECSTASY) UR SCREEN: NOT DETECTED
Methadone Scn, Ur: NOT DETECTED
Opiate, Ur Screen: NOT DETECTED
PHENCYCLIDINE (PCP) UR S: NOT DETECTED
Tricyclic, Ur Screen: NOT DETECTED

## 2017-01-22 LAB — TROPONIN I
TROPONIN I: 0.04 ng/mL — AB (ref <0.03)
Troponin I: 0.07 ng/mL (ref <0.03)
Troponin I: 0.04 ng/mL (ref <0.03)

## 2017-01-22 LAB — MAGNESIUM: Magnesium: 1.8 mg/dL (ref 1.7–2.4)

## 2017-01-22 LAB — PHOSPHORUS: PHOSPHORUS: 3.8 mg/dL (ref 2.5–4.6)

## 2017-01-22 LAB — CBC
HEMATOCRIT: 45 % (ref 40.0–52.0)
Hemoglobin: 15.1 g/dL (ref 13.0–18.0)
MCH: 29 pg (ref 26.0–34.0)
MCHC: 33.5 g/dL (ref 32.0–36.0)
MCV: 86.5 fL (ref 80.0–100.0)
Platelets: 194 10*3/uL (ref 150–440)
RBC: 5.21 MIL/uL (ref 4.40–5.90)
RDW: 14.5 % (ref 11.5–14.5)
WBC: 9.2 10*3/uL (ref 3.8–10.6)

## 2017-01-22 NOTE — Consult Note (Signed)
Howard Weaver is a 61 y.o. male  [id number]  Primary Cardiologist: Dr.Shaukat Welton FlakesKhan  Reason for Consultation: Chest pain and tachycardia  HPI: Howard Weaver presented to ER with chest pain and rapid heart rate. He has a history of difficult to control HTN. He stated he felt sick so he missed one day of his medications which may have caused his uncontrolled HTN and tachycardia.     Review of Systems: Pt is feeling very well and reports he is "back to normal". No chest pain or shortness of breath.   Past Medical History:  Diagnosis Date  . CAD (coronary artery disease)   . Chest pain   . DU (duodenal ulcer)   . GAD (generalized anxiety disorder)   . Hypertension   . Myocardial infarction (HCC)   . Radiculopathy of thoracolumbar region   . SVT (supraventricular tachycardia) (HCC)   . Tachycardia     Medications Prior to Admission  Medication Sig Dispense Refill  . benazepril (LOTENSIN) 20 MG tablet Take 1 tablet by mouth daily.    . busPIRone (BUSPAR) 7.5 MG tablet Take 7.5 mg by mouth 2 (two) times daily.    . cloNIDine (CATAPRES) 0.3 MG tablet Take 0.3 mg by mouth 2 (two) times daily.    . cyclobenzaprine (FLEXERIL) 10 MG tablet Take 10 mg by mouth 3 (three) times daily as needed for muscle spasms.    . DULoxetine (CYMBALTA) 60 MG capsule Take 1 capsule by mouth daily.    . hydrochlorothiazide (HYDRODIURIL) 25 MG tablet Take 25 mg by mouth daily.    Marland Kitchen. HYDROcodone-acetaminophen (NORCO/VICODIN) 5-325 MG tablet Take 1 tablet by mouth 2 (two) times daily as needed.    . isosorbide mononitrate (IMDUR) 30 MG 24 hr tablet Take 30 mg by mouth daily.    . metoprolol (LOPRESSOR) 100 MG tablet Take 100 mg by mouth 2 (two) times daily.    . pantoprazole (PROTONIX) 40 MG tablet Take 1 tablet (40 mg total) by mouth 2 (two) times daily. 60 tablet 2  . rosuvastatin (CRESTOR) 20 MG tablet Take 20 mg by mouth daily.    . traMADol (ULTRAM) 50 MG tablet Take 1 tablet by mouth as needed.        . benazepril  20 mg Oral Daily  . busPIRone  7.5 mg Oral BID  . cloNIDine  0.3 mg Oral BID  . DULoxetine  30 mg Oral Daily  . enoxaparin (LOVENOX) injection  40 mg Subcutaneous Q24H  . isosorbide mononitrate  30 mg Oral Daily  . metoprolol tartrate  100 mg Oral BID  . pantoprazole  40 mg Oral BID  . rosuvastatin  20 mg Oral Daily    Infusions: . diltiazem (CARDIZEM) infusion Stopped (01/22/17 0804)    Allergies  Allergen Reactions  . Penicillins Anaphylaxis    Has patient had a PCN reaction causing immediate rash, facial/tongue/throat swelling, SOB or lightheadedness with hypotension: Yes Has patient had a PCN reaction causing severe rash involving mucus membranes or skin necrosis: Yes Has patient had a PCN reaction that required hospitalization Yes Has patient had a PCN reaction occurring within the last 10 years: No If all of the above answers are "NO", then may proceed with Cephalosporin use.   . Ibuprofen Other (See Comments)    Gi upset    Social History   Social History  . Marital status: Married    Spouse name: N/A  . Number of children: N/A  . Years of education:  N/A   Occupational History  . Not on file.   Social History Main Topics  . Smoking status: Former Smoker    Quit date: 11/25/1975  . Smokeless tobacco: Never Used  . Alcohol use No  . Drug use: No  . Sexual activity: Not on file   Other Topics Concern  . Not on file   Social History Narrative  . No narrative on file    Family History  Problem Relation Age of Onset  . Hypertension Mother   . Diabetes Mother   . Depression Mother   . Hypertension Father     PHYSICAL EXAM: Vitals:   01/22/17 0700 01/22/17 0710  BP: 96/85   Pulse: 71 63  Resp: 18 20  Temp:  97.7 F (36.5 C)     Intake/Output Summary (Last 24 hours) at 01/22/17 0827 Last data filed at 01/22/17 0711  Gross per 24 hour  Intake            85.12 ml  Output              190 ml  Net          -104.88 ml     General:  Well appearing. No respiratory difficulty HEENT: normal Neck: supple. no JVD. Carotids 2+ bilat; no bruits. No lymphadenopathy or thryomegaly appreciated. Cor: PMI nondisplaced. Regular rate & rhythm. No rubs, gallops or murmurs. Lungs: clear Abdomen: soft, nontender, nondistended. No hepatosplenomegaly. No bruits or masses. Good bowel sounds. Extremities: no cyanosis, clubbing, rash, edema Neuro: alert & oriented x 3, cranial nerves grossly intact. moves all 4 extremities w/o difficulty. Affect pleasant.  ECG: NSR 62bpm  Results for orders placed or performed during the hospital encounter of 01/21/17 (from the past 24 hour(s))  Basic metabolic panel     Status: Abnormal   Collection Time: 01/21/17  8:28 PM  Result Value Ref Range   Sodium 141 135 - 145 mmol/L   Potassium 4.2 3.5 - 5.1 mmol/L   Chloride 107 101 - 111 mmol/L   CO2 23 22 - 32 mmol/L   Glucose, Bld 120 (H) 65 - 99 mg/dL   BUN 23 (H) 6 - 20 mg/dL   Creatinine, Ser 1.61 (H) 0.61 - 1.24 mg/dL   Calcium 09.6 (H) 8.9 - 10.3 mg/dL   GFR calc non Af Amer 50 (L) >60 mL/min   GFR calc Af Amer 58 (L) >60 mL/min   Anion gap 11 5 - 15  Hepatic function panel     Status: Abnormal   Collection Time: 01/21/17  8:28 PM  Result Value Ref Range   Total Protein 8.6 (H) 6.5 - 8.1 g/dL   Albumin 4.6 3.5 - 5.0 g/dL   AST 27 15 - 41 U/L   ALT 19 17 - 63 U/L   Alkaline Phosphatase 63 38 - 126 U/L   Total Bilirubin 1.0 0.3 - 1.2 mg/dL   Bilirubin, Direct <0.4 (L) 0.1 - 0.5 mg/dL   Indirect Bilirubin NOT CALCULATED 0.3 - 0.9 mg/dL  Troponin I     Status: Abnormal   Collection Time: 01/21/17  8:28 PM  Result Value Ref Range   Troponin I 0.06 (HH) <0.03 ng/mL  Brain natriuretic peptide     Status: Abnormal   Collection Time: 01/21/17  8:28 PM  Result Value Ref Range   B Natriuretic Peptide 248.0 (H) 0.0 - 100.0 pg/mL  CBC with Differential     Status: Abnormal   Collection Time: 01/21/17  8:28  PM  Result Value Ref  Range   WBC 10.7 (H) 3.8 - 10.6 K/uL   RBC 5.72 4.40 - 5.90 MIL/uL   Hemoglobin 16.7 13.0 - 18.0 g/dL   HCT 14.7 82.9 - 56.2 %   MCV 85.6 80.0 - 100.0 fL   MCH 29.2 26.0 - 34.0 pg   MCHC 34.1 32.0 - 36.0 g/dL   RDW 13.0 (H) 86.5 - 78.4 %   Platelets 225 150 - 440 K/uL   Neutrophils Relative % 77 %   Neutro Abs 8.3 (H) 1.4 - 6.5 K/uL   Lymphocytes Relative 12 %   Lymphs Abs 1.3 1.0 - 3.6 K/uL   Monocytes Relative 9 %   Monocytes Absolute 0.9 0.2 - 1.0 K/uL   Eosinophils Relative 1 %   Eosinophils Absolute 0.1 0 - 0.7 K/uL   Basophils Relative 1 %   Basophils Absolute 0.1 0 - 0.1 K/uL  TSH     Status: Abnormal   Collection Time: 01/21/17  8:28 PM  Result Value Ref Range   TSH 4.580 (H) 0.350 - 4.500 uIU/mL  Glucose, capillary     Status: Abnormal   Collection Time: 01/21/17 10:28 PM  Result Value Ref Range   Glucose-Capillary 135 (H) 65 - 99 mg/dL   Comment 1 Notify RN   MRSA PCR Screening     Status: None   Collection Time: 01/21/17 10:44 PM  Result Value Ref Range   MRSA by PCR NEGATIVE NEGATIVE  Basic metabolic panel     Status: Abnormal   Collection Time: 01/22/17  2:39 AM  Result Value Ref Range   Sodium 139 135 - 145 mmol/L   Potassium 4.9 3.5 - 5.1 mmol/L   Chloride 108 101 - 111 mmol/L   CO2 24 22 - 32 mmol/L   Glucose, Bld 131 (H) 65 - 99 mg/dL   BUN 22 (H) 6 - 20 mg/dL   Creatinine, Ser 6.96 (H) 0.61 - 1.24 mg/dL   Calcium 9.4 8.9 - 29.5 mg/dL   GFR calc non Af Amer 59 (L) >60 mL/min   GFR calc Af Amer >60 >60 mL/min   Anion gap 7 5 - 15  CBC     Status: None   Collection Time: 01/22/17  2:39 AM  Result Value Ref Range   WBC 9.2 3.8 - 10.6 K/uL   RBC 5.21 4.40 - 5.90 MIL/uL   Hemoglobin 15.1 13.0 - 18.0 g/dL   HCT 28.4 13.2 - 44.0 %   MCV 86.5 80.0 - 100.0 fL   MCH 29.0 26.0 - 34.0 pg   MCHC 33.5 32.0 - 36.0 g/dL   RDW 10.2 72.5 - 36.6 %   Platelets 194 150 - 440 K/uL  Troponin I     Status: Abnormal   Collection Time: 01/22/17  2:39 AM  Result  Value Ref Range   Troponin I 0.07 (HH) <0.03 ng/mL  Magnesium     Status: None   Collection Time: 01/22/17  2:39 AM  Result Value Ref Range   Magnesium 1.8 1.7 - 2.4 mg/dL  Phosphorus     Status: None   Collection Time: 01/22/17  2:39 AM  Result Value Ref Range   Phosphorus 3.8 2.5 - 4.6 mg/dL  Urine Drug Screen, Qualitative ([facility] only)     Status: None   Collection Time: 01/22/17  4:38 AM  Result Value Ref Range   Tricyclic, Ur Screen NONE DETECTED NONE DETECTED   Amphetamines, Ur Screen NONE DETECTED NONE DETECTED  MDMA (Ecstasy)Ur Screen NONE DETECTED NONE DETECTED   Cocaine Metabolite,Ur [place] NONE DETECTED NONE DETECTED   Opiate, Ur Screen NONE DETECTED NONE DETECTED   Phencyclidine (PCP) Ur S NONE DETECTED NONE DETECTED   Cannabinoid 50 Ng, Ur [place] NONE DETECTED NONE DETECTED   Barbiturates, Ur Screen NONE DETECTED NONE DETECTED   Benzodiazepine, Ur Scrn NONE DETECTED NONE DETECTED   Methadone Scn, Ur NONE DETECTED NONE DETECTED   Dg Chest 2 View  Result Date: 01/21/2017 CLINICAL DATA:  Chest pain EXAM: CHEST  2 VIEW COMPARISON:  11/25/2015, 05/01/2014 FINDINGS: No acute pulmonary infiltrate, consolidation, or pleural effusion. Cardiomediastinal silhouette stable with tortuous aorta. No pneumothorax. IMPRESSION: No active cardiopulmonary disease. Electronically Signed   By: Jasmine Pang M.D.   On: 01/21/2017 20:52     ASSESSMENT AND PLAN: Chest pain with tachycardia probably due to missed doses of normal HTN medication. Pt has very mildly elevated troponin probably due to demand ischemia due to tachycardia. Pt is now feeling back to normal, heart rate is well controlled with metoprolol and blood pressure is returning to within normal limits. Pt is starting back his PO medications. He was recently seen in our office feeling well, recent echo was unremarkable and pt has history of CCTA with 0 calcium score 09/2013.  Advise transferring to telemetry for continued observation and if  heart rate and blood pressure have stabilized once returned to PO medications may consider discharge tomorrow.   Caroleen Hamman

## 2017-01-22 NOTE — Consult Note (Signed)
PULMONARY / CRITICAL CARE MEDICINE   Name: Howard SiaRichard J Weaver MRN: [id number] DOB: 1956-04-03    ADMISSION DATE:  01/21/2017   CONSULTATION DATE:  01/22/17  REFERRING MD:  Dr. Anne HahnWillis  REASON: SVT and hypertensive crisis  CHIEF COMPLAINT:  Chest pain  HISTORY OF PRESENT ILLNESS:   This is a 61 year old Caucasian male with a past medical history of angina, coronary artery disease status post MI, generalized anxiety disorder, hypertension, and tachycardia who presents with chest pain that started at about 5 PM yesterday. He described the pain as pressure in nature, 7 on 10 in intensity and nonradicular. Pain was associated with headache, palpitations and dizziness. He was given adenosine 2 doses in the ED with some improvement in his heart rate. He was started on a nitroglycerin infusion and his heart rate rose to 150, hence the infusion was discontinued. He is now on a diltiazem infusion and reports significant improvement in symptoms.  PAST MEDICAL HISTORY :  He  has a past medical history of Chest pain; DU (duodenal ulcer); GAD (generalized anxiety disorder); Hypertension; Myocardial infarction (HCC); Radiculopathy of thoracolumbar region; and Tachycardia.  PAST SURGICAL HISTORY: He  has a past surgical history that includes Brain surgery and Cardiac catheterization (Right, 11/25/2015).  Allergies  Allergen Reactions  . Penicillins Anaphylaxis    Has patient had a PCN reaction causing immediate rash, facial/tongue/throat swelling, SOB or lightheadedness with hypotension: Yes Has patient had a PCN reaction causing severe rash involving mucus membranes or skin necrosis: Yes Has patient had a PCN reaction that required hospitalization Yes Has patient had a PCN reaction occurring within the last 10 years: No If all of the above answers are "NO", then may proceed with Cephalosporin use.   . Ibuprofen Other (See Comments)    Gi upset    No current facility-administered medications on  file prior to encounter.    Current Outpatient Prescriptions on File Prior to Encounter  Medication Sig  . benazepril (LOTENSIN) 20 MG tablet Take 1 tablet by mouth daily.  . busPIRone (BUSPAR) 7.5 MG tablet Take 7.5 mg by mouth 2 (two) times daily.  . cloNIDine (CATAPRES) 0.3 MG tablet Take 0.3 mg by mouth 2 (two) times daily.  . cyclobenzaprine (FLEXERIL) 10 MG tablet Take 10 mg by mouth 3 (three) times daily as needed for muscle spasms.  . hydrochlorothiazide (HYDRODIURIL) 25 MG tablet Take 25 mg by mouth daily.  Marland Kitchen. HYDROcodone-acetaminophen (NORCO/VICODIN) 5-325 MG tablet Take 1 tablet by mouth 2 (two) times daily as needed.  . isosorbide mononitrate (IMDUR) 30 MG 24 hr tablet Take 30 mg by mouth daily.  . metoprolol (LOPRESSOR) 100 MG tablet Take 100 mg by mouth 2 (two) times daily.  . pantoprazole (PROTONIX) 40 MG tablet Take 1 tablet (40 mg total) by mouth 2 (two) times daily.  . rosuvastatin (CRESTOR) 20 MG tablet Take 20 mg by mouth daily.  . traMADol (ULTRAM) 50 MG tablet Take 1 tablet by mouth as needed.    FAMILY HISTORY:  His indicated that the status of his mother is unknown. He indicated that the status of his father is unknown.    SOCIAL HISTORY: He  reports that he quit smoking about 41 years ago. He has never used smokeless tobacco. He reports that he does not drink alcohol or use drugs.  REVIEW OF SYSTEMS:   Constitutional: Negative for fever and chills.  HENT: Negative for congestion and rhinorrhea.  Eyes: Negative for redness and visual disturbance.  Respiratory: Negative for  shortness of breath and wheezing.  Cardiovascular: Positive for chest pain and palpitations but stated that both symptoms have resolved.  Gastrointestinal: Negative  for nausea , vomiting and abdominal pain and  Loose stools Genitourinary: Negative for dysuria and urgency.  Endocrine: Denies polyuria, polyphagia and heat intolerance Musculoskeletal: Negative for myalgias and arthralgias.   Skin: Negative for pallor and wound.  Neurological: Negative for dizziness and headaches   SUBJECTIVE:   VITAL SIGNS: BP (!) 184/87   Pulse (!) 136   Temp 98.3 F (36.8 C) (Oral)   Resp (!) 32   Ht 5\' 8"  (1.727 m)   Wt 218 lb (98.9 kg)   SpO2 95%   BMI 33.15 kg/m   HEMODYNAMICS:    VENTILATOR SETTINGS:    INTAKE / OUTPUT: No intake/output data recorded.  PHYSICAL EXAMINATION: General:  Pleasant, no acute distress Neuro:  Alert and oriented 4, no focal deficits HEENT:  PERRLA, conjunctivae normal, sclera is anicteric.  Cardiovascular:  Normal rate and regular rhythm, S1, S2, no murmur, regurg or gallop, no edema, +2 pulses bilaterally Lungs: Clear to auscultation bilaterally, normal work of breathing Abdomen:  Nondistended, positive bowel sounds in all 4 quadrants, palpation reveals no organomegaly Musculoskeletal: No swelling, positive range of motion in upper and lower extremities Skin: Warm and dry  LABS:  BMET  Recent Labs Lab 01/21/17 2028  NA 141  K 4.2  CL 107  CO2 23  BUN 23*  CREATININE 1.47*  GLUCOSE 120*    Electrolytes  Recent Labs Lab 01/21/17 2028  CALCIUM 10.6*    CBC  Recent Labs Lab 01/21/17 2028  WBC 10.7*  HGB 16.7  HCT 49.0  PLT 225    Coag's No results for input(s): APTT, INR in the last 168 hours.  Sepsis Markers No results for input(s): LATICACIDVEN, PROCALCITON, O2SATVEN in the last 168 hours.  ABG No results for input(s): PHART, PCO2ART, PO2ART in the last 168 hours.  Liver Enzymes  Recent Labs Lab 01/21/17 2028  AST 27  ALT 19  ALKPHOS 63  BILITOT 1.0  ALBUMIN 4.6    Cardiac Enzymes  Recent Labs Lab 01/21/17 2028  TROPONINI 0.06*    Glucose  Recent Labs Lab 01/21/17 2228  GLUCAP 135*    Imaging Dg Chest 2 View  Result Date: 01/21/2017 CLINICAL DATA:  Chest pain EXAM: CHEST  2 VIEW COMPARISON:  11/25/2015, 05/01/2014 FINDINGS: No acute pulmonary infiltrate, consolidation, or  pleural effusion. Cardiomediastinal silhouette stable with tortuous aorta. No pneumothorax. IMPRESSION: No active cardiopulmonary disease. Electronically Signed   By: Jasmine Pang M.D.   On: 01/21/2017 20:52   STUDIES:  2-D echo pending  SIGNIFICANT EVENTS: 01/21/17: ED with chest pain and elevated heart rate in the 180s, admitted with SVT and hypertensive crisis  LINES/TUBES: Peripheral IVs  DISCUSSION: This is a 61 year old Caucasian male presenting with supraventricular tachycardia, and hypertensive crisis.  ASSESSMENT Supraventricular tachycardia. Hypertensive crisis History of coronary artery disease status post MI  Plan IV fluids Diltiazem infusion, titrate to maintain heart rate less than 110 bpm Hemodynamic monitoring per ICU protocol. Continue ALL home medications Cardiology consult 2-D echocardiogram Cycle cardiac enzymes. EKG Chest x-ray reviewed GI and DVT prophylaxis-patient is already on Protonix twice a day; Lovenox 40 mg daily  Further changes in treatment plan pending patient's clinical course and diagnostics  FAMILY  - Updates: Patient updated on current treatment plan  - Inter-disciplinary family meet or Palliative Care meeting due by:  day 7  Plan of care  discussed with Dr. Lidia Collum. [facility] ANP-BC Pulmonary and Critical Care Medicine [facility] Pager [phone] or [phone] 01/22/2017, 12:42 AM

## 2017-01-22 NOTE — Progress Notes (Signed)
Patient alert and oriented, reporting no pain. Vital signs remained stable since discontinuation of IV drip. [facility]. Patient transfer in progress.

## 2017-01-22 NOTE — Progress Notes (Signed)
Patient is alert and oriented, reporting no pain this shift. Patient stated that he had not taken his home medications prior to this admission because he needed to refill them, relayed this information to Cardiology, Hospitalist, and Intensivist rounding on patient this morning. Vital signs stable. Cardizem drip stopped this morning and will continue home medications per orders. Patient cleared per Mds to transfer to 2A. Awaiting bed assignment. Patient currently resting quietly at this time. Will continue to monitor.

## 2017-01-22 NOTE — Progress Notes (Addendum)
[employer] [facility] [facility] [place] [facility]   PATIENT NAME: Howard ShropshireRichard Weaver    MR#:  [id number]  DATE OF BIRTH:  July 09, 1956  SUBJECTIVE:  CHIEF COMPLAINT:   Chief Complaint  Patient presents with  . Chest Pain   - Admitted with chest pain, ran out of his blood pressure medication. Blood pressure is much better today. -Denies any complaints at this time. Cardizem drip being turned off this morning  REVIEW OF SYSTEMS:  Review of Systems  Constitutional: Negative for chills, fever and malaise/fatigue.  HENT: Negative for congestion, hearing loss, nosebleeds and sore throat.   Eyes: Negative for blurred vision and double vision.  Respiratory: Negative for cough, shortness of breath and wheezing.   Cardiovascular: Negative for chest pain, palpitations and leg swelling.  Gastrointestinal: Negative for abdominal pain, constipation, diarrhea, nausea and vomiting.  Genitourinary: Negative for dysuria and urgency.  Musculoskeletal: Negative for myalgias.  Neurological: Negative for dizziness, sensory change, speech change, focal weakness, seizures and headaches.  Psychiatric/Behavioral: Negative for depression.    DRUG ALLERGIES:   Allergies  Allergen Reactions  . Penicillins Anaphylaxis    Has patient had a PCN reaction causing immediate rash, facial/tongue/throat swelling, SOB or lightheadedness with hypotension: Yes Has patient had a PCN reaction causing severe rash involving mucus membranes or skin necrosis: Yes Has patient had a PCN reaction that required hospitalization Yes Has patient had a PCN reaction occurring within the last 10 years: No If all of the above answers are "NO", then may proceed with Cephalosporin use.   . Ibuprofen Other (See Comments)    Gi upset    VITALS:  Blood pressure 96/85, pulse 63, temperature 97.7 F (36.5 C), temperature source Oral, resp. rate 20, height 5\' 8"  (1.727 m), weight 98.9 kg (218 lb), SpO2 98 %.  PHYSICAL EXAMINATION:    Physical Exam  GENERAL:  61 y.o.-year-old patient lying in the bed with no acute distress.  EYES: Pupils equal, round, reactive to light and accommodation. No scleral icterus. Extraocular muscles intact.  HEENT: Head atraumatic, normocephalic. Oropharynx and nasopharynx clear.  NECK:  Supple, no jugular venous distention. No thyroid enlargement, no tenderness.  LUNGS: Normal breath sounds bilaterally, no wheezing, rales,rhonchi or crepitation. No use of accessory muscles of respiration.  CARDIOVASCULAR: S1, S2 normal. No murmurs, rubs, or gallops.  ABDOMEN: Soft, nontender, nondistended. Bowel sounds present. No organomegaly or mass.  EXTREMITIES: No pedal edema, cyanosis, or clubbing.  NEUROLOGIC: Cranial nerves II through XII are intact. Muscle strength 5/5 in all extremities. Sensation intact. Gait not checked.  PSYCHIATRIC: The patient is alert and oriented x 3.  SKIN: No obvious rash, lesion, or ulcer.    LABORATORY PANEL:   CBC  Recent Labs Lab 01/22/17 0239  WBC 9.2  HGB 15.1  HCT 45.0  PLT 194   ------------------------------------------------------------------------------------------------------------------  Chemistries   Recent Labs Lab 01/21/17 2028 01/22/17 0239  NA 141 139  K 4.2 4.9  CL 107 108  CO2 23 24  GLUCOSE 120* 131*  BUN 23* 22*  CREATININE 1.47* 1.28*  CALCIUM 10.6* 9.4  MG  --  1.8  AST 27  --   ALT 19  --   ALKPHOS 63  --   BILITOT 1.0  --    ------------------------------------------------------------------------------------------------------------------  Cardiac Enzymes  Recent Labs Lab 01/22/17 0239  TROPONINI 0.07*   ------------------------------------------------------------------------------------------------------------------  RADIOLOGY:  Dg Chest 2 View  Result Date: 01/21/2017 CLINICAL DATA:  Chest pain EXAM: CHEST  2 VIEW COMPARISON:  11/25/2015, 05/01/2014 FINDINGS: No acute pulmonary infiltrate, consolidation, or  pleural effusion. Cardiomediastinal silhouette stable with tortuous aorta. No pneumothorax. IMPRESSION: No active cardiopulmonary disease. Electronically Signed   By: Jasmine Pang M.D.   On: 01/21/2017 20:52    EKG:   Orders placed or performed during the hospital encounter of 01/21/17  . EKG 12-Lead  . EKG 12-Lead  . EKG 12-Lead  . EKG 12-Lead  . ED EKG  . ED EKG  . EKG 12-Lead  . EKG 12-Lead  . ED EKG  . ED EKG  . EKG 12-Lead  . EKG 12-Lead    ASSESSMENT AND PLAN:   61 year old male with past medical history significant for CAD, cardiac catheterization revealing nonobstructive disease March 2017, generalized anxiety disorder, hypertension and ran out of his medications presents to hospital secondary to chest pain and noted to have tachycardia and hypertensive urgency.  #1 hypertensive urgency-was initially started on nitro drip. Currently being off of the drip. -Restarted his oral antihypertensives which include benazepril, clonidine, Imdur, metoprolol -Blood pressure is much better today.  #2. SVT versus sinus tachycardia-resolved at this time. Discontinue Cardizem drip. Oral metoprolol  #3. Hyperlipidemia-continue  #4 depression and anxiety-on Cymbalta and BuSpar.  #5 DVT prophylaxis-Lovenox.  #6 Elevated troponin- demand ischemia, from elevated BP and also tachycardia Cardiology consulted.  Can be transferred out of CCU. Monitor on the medication changes. Anticipate discharge tomorrow     All the records are reviewed and case discussed with Care Management/Social Workerr. Management plans discussed with the patient, family and they are in agreement.  CODE STATUS: Full code  TOTAL TIME TAKING CARE OF THIS PATIENT: 38 minutes.   POSSIBLE D/C TOMORROW, DEPENDING ON CLINICAL CONDITION.   Enid Baas M.D on 01/22/2017 at 8:28 AM  Between 7am to 6pm - Pager - [phone]  After 6pm go to [email or web address] - Social research officer, government  [facility]   Office  [phone]  CC: Primary care physician; Carlean Jews, NP

## 2017-01-23 LAB — BASIC METABOLIC PANEL
ANION GAP: 5 (ref 5–15)
BUN: 30 mg/dL — ABNORMAL HIGH (ref 6–20)
CALCIUM: 9.1 mg/dL (ref 8.9–10.3)
CHLORIDE: 105 mmol/L (ref 101–111)
CO2: 26 mmol/L (ref 22–32)
Creatinine, Ser: 1.33 mg/dL — ABNORMAL HIGH (ref 0.61–1.24)
GFR calc non Af Amer: 56 mL/min — ABNORMAL LOW (ref >60)
Glucose, Bld: 118 mg/dL — ABNORMAL HIGH (ref 65–99)
Potassium: 4.3 mmol/L (ref 3.5–5.1)
Sodium: 136 mmol/L (ref 135–145)

## 2017-01-23 NOTE — Discharge Summary (Signed)
[facility] [place] [facility]   PATIENT NAME: Howard Weaver    MR#:  [id number]  DATE OF BIRTH:  [birth year]  DATE OF ADMISSION:  01/21/2017   ADMITTING PHYSICIAN: Oralia Manis, MD  DATE OF DISCHARGE: 01/23/2017  PRIMARY CARE PHYSICIAN: Carlean Jews, NP   ADMISSION DIAGNOSIS:   Chest pain, unspecified type [R07.9] Hypertension, unspecified type [I10]  DISCHARGE DIAGNOSIS:   Principal Problem:   Chest pain Active Problems:   Accelerated hypertension   CAD (coronary artery disease)   GAD (generalized anxiety disorder)   CKD (chronic kidney disease), stage III   SECONDARY DIAGNOSIS:   Past Medical History:  Diagnosis Date  . CAD (coronary artery disease)   . Chest pain   . DU (duodenal ulcer)   . GAD (generalized anxiety disorder)   . Hypertension   . Myocardial infarction (HCC)   . Radiculopathy of thoracolumbar region   . SVT (supraventricular tachycardia) (HCC)   . Tachycardia     HOSPITAL COURSE:   61 year old male with past medical history significant for CAD, cardiac catheterization revealing nonobstructive disease March 2017, generalized anxiety disorder, hypertension and ran out of his medications presents to hospital secondary to chest pain and noted to have tachycardia and hypertensive urgency.  #1 hypertensive urgency- secondary to missing his medications for 1 day. -Was initially admitted to ICU for nitroglycerin drip. Once his oral medications were started, he came off of the drip.. -And tinea with some medications which include benazepril, clonidine, hydrochlorothiazide, Imdur, metoprolol -Blood pressure is much better now  #2. SVT versus sinus tachycardia-resolved at this time. On Oral metoprolol  #3. Hyperlipidemia-continue statin  #4 depression and anxiety-on Cymbalta and BuSpar.  #5 Elevated troponin- demand ischemia, from elevated BP and also tachycardia Cardiology consulted. No evidence of acute MI.  Cardiology follow up as outpatient  Feels much better and being discharged today   DISCHARGE CONDITIONS:   Guarded CONSULTS OBTAINED:   Treatment Team:  Laurier Nancy, MD  DRUG ALLERGIES:   Allergies  Allergen Reactions  . Penicillins Anaphylaxis    Has patient had a PCN reaction causing immediate rash, facial/tongue/throat swelling, SOB or lightheadedness with hypotension: Yes Has patient had a PCN reaction causing severe rash involving mucus membranes or skin necrosis: Yes Has patient had a PCN reaction that required hospitalization Yes Has patient had a PCN reaction occurring within the last 10 years: No If all of the above answers are "NO", then may proceed with Cephalosporin use.   . Ibuprofen Other (See Comments)    Gi upset   DISCHARGE MEDICATIONS:   Allergies as of 01/23/2017      Reactions   Penicillins Anaphylaxis   Has patient had a PCN reaction causing immediate rash, facial/tongue/throat swelling, SOB or lightheadedness with hypotension: Yes Has patient had a PCN reaction causing severe rash involving mucus membranes or skin necrosis: Yes Has patient had a PCN reaction that required hospitalization Yes Has patient had a PCN reaction occurring within the last 10 years: No If all of the above answers are "NO", then may proceed with Cephalosporin use.   Ibuprofen Other (See Comments)   Gi upset      Medication List    STOP taking these medications   traMADol 50 MG tablet Commonly known as:  ULTRAM     TAKE these medications   benazepril 20 MG tablet Commonly known as:  LOTENSIN Take 1 tablet by mouth daily.   busPIRone 7.5 MG tablet Commonly known  as:  BUSPAR Take 7.5 mg by mouth 2 (two) times daily.   cloNIDine 0.3 MG tablet Commonly known as:  CATAPRES Take 0.3 mg by mouth 2 (two) times daily.   DULoxetine 60 MG capsule Commonly known as:  CYMBALTA Take 1 capsule by mouth daily.   hydrochlorothiazide 25 MG tablet Commonly known as:   HYDRODIURIL Take 25 mg by mouth daily.   HYDROcodone-acetaminophen 5-325 MG tablet Commonly known as:  NORCO/VICODIN Take 1 tablet by mouth 2 (two) times daily as needed.   isosorbide mononitrate 30 MG 24 hr tablet Commonly known as:  IMDUR Take 30 mg by mouth daily.   metoprolol tartrate 100 MG tablet Commonly known as:  LOPRESSOR Take 100 mg by mouth 2 (two) times daily.   pantoprazole 40 MG tablet Commonly known as:  PROTONIX Take 1 tablet (40 mg total) by mouth 2 (two) times daily.   rosuvastatin 40 MG tablet Commonly known as:  CRESTOR Take 40 mg by mouth daily.        DISCHARGE INSTRUCTIONS:   1. PCP follow-up in 1-2 weeks 2. Cardiology follow up in 2 weeks  DIET:   Cardiac diet  ACTIVITY:   Activity as tolerated  OXYGEN:   Home Oxygen: No.  Oxygen Delivery: room air  DISCHARGE LOCATION:   home   If you experience worsening of your admission symptoms, develop shortness of breath, life threatening emergency, suicidal or homicidal thoughts you must seek medical attention immediately by calling 911 or calling your MD immediately  if symptoms less severe.  You Must read complete instructions/literature along with all the possible adverse reactions/side effects for all the Medicines you take and that have been prescribed to you. Take any new Medicines after you have completely understood and accpet all the possible adverse reactions/side effects.   Please note  You were cared for by a hospitalist during your hospital stay. If you have any questions about your discharge medications or the care you received while you were in the hospital after you are discharged, you can call the unit and asked to speak with the hospitalist on call if the hospitalist that took care of you is not available. Once you are discharged, your primary care physician will handle any further medical issues. Please note that NO REFILLS for any discharge medications will be authorized once  you are discharged, as it is imperative that you return to your primary care physician (or establish a relationship with a primary care physician if you do not have one) for your aftercare needs so that they can reassess your need for medications and monitor your lab values.    On the day of Discharge:  VITAL SIGNS:   Blood pressure (!) 136/92, pulse 75, temperature 97.5 F (36.4 C), temperature source Oral, resp. rate 18, height 5\' 8"  (1.727 m), weight 98.9 kg (218 lb), SpO2 96 %.  PHYSICAL EXAMINATION:   GENERAL:  61 y.o.-year-old patient sitting in the chair with no acute distress.  EYES: Pupils equal, round, reactive to light and accommodation. No scleral icterus. Extraocular muscles intact.  HEENT: Head atraumatic, normocephalic. Oropharynx and nasopharynx clear.  NECK:  Supple, no jugular venous distention. No thyroid enlargement, no tenderness.  LUNGS: Normal breath sounds bilaterally, no wheezing, rales,rhonchi or crepitation. No use of accessory muscles of respiration.  CARDIOVASCULAR: S1, S2 normal. No murmurs, rubs, or gallops.  ABDOMEN: Soft, nontender, nondistended. Bowel sounds present. No organomegaly or mass.  EXTREMITIES: No pedal edema, cyanosis, or clubbing.  NEUROLOGIC: Cranial  nerves II through XII are intact. Muscle strength 5/5 in all extremities. Sensation intact. Gait not checked.  PSYCHIATRIC: The patient is alert and oriented x 3.  SKIN: No obvious rash, lesion, or ulcer.   DATA REVIEW:   CBC  Recent Labs Lab 01/22/17 0239  WBC 9.2  HGB 15.1  HCT 45.0  PLT 194    Chemistries   Recent Labs Lab 01/21/17 2028 01/22/17 0239 01/23/17 0442  NA 141 139 136  K 4.2 4.9 4.3  CL 107 108 105  CO2 23 24 26   GLUCOSE 120* 131* 118*  BUN 23* 22* 30*  CREATININE 1.47* 1.28* 1.33*  CALCIUM 10.6* 9.4 9.1  MG  --  1.8  --   AST 27  --   --   ALT 19  --   --   ALKPHOS 63  --   --   BILITOT 1.0  --   --      Microbiology Results  Results for orders  placed or performed during the hospital encounter of 01/21/17  MRSA PCR Screening     Status: None   Collection Time: 01/21/17 10:44 PM  Result Value Ref Range Status   MRSA by PCR NEGATIVE NEGATIVE Final    Comment:        The GeneXpert MRSA Assay (FDA approved for NASAL specimens only), is one component of a comprehensive MRSA colonization surveillance program. It is not intended to diagnose MRSA infection nor to guide or monitor treatment for MRSA infections.     RADIOLOGY:  No results found.   Management plans discussed with the patient, family and they are in agreement.  CODE STATUS:     Code Status Orders        Start     Ordered   01/21/17 2235  Full code  Continuous     01/21/17 2242    Code Status History    Date Active Date Inactive Code Status Order ID Comments User Context   11/25/2015 11:39 AM 11/26/2015  3:46 PM Full Code [id number]  Katha Hamming, MD ED      TOTAL TIME TAKING CARE OF THIS PATIENT: 38 minutes.    Enid Baas M.D on 01/23/2017 at 12:08 PM  Between 7am to 6pm - Pager - [phone]  After 6pm go to [email or web address] - Social research officer, government  [facility]  Office  [phone]  CC: Primary care physician; Carlean Jews, NP   Note: This dictation was prepared with Dragon dictation along with smaller phrase technology. Any transcriptional errors that result from this process are unintentional.

## 2017-01-23 NOTE — Discharge Planning (Signed)
Patient IV and tele removed.  Discharge papers given, explained and educated.  No scripts needed.  Informed of suggested FU appts and patient agrees to call on Monday to set up (since office not open today).  RN assessment and VS revealed stability for DC to home.  When ready, will be wheeled to front and family transporting home via car.

## 2017-01-24 LAB — ECHOCARDIOGRAM COMPLETE
HEIGHTINCHES: 68 in
Weight: 3488 oz

## 2017-02-04 DIAGNOSIS — I340 Nonrheumatic mitral (valve) insufficiency: Secondary | ICD-10-CM | POA: Diagnosis not present

## 2017-02-04 DIAGNOSIS — I2510 Atherosclerotic heart disease of native coronary artery without angina pectoris: Secondary | ICD-10-CM | POA: Diagnosis not present

## 2017-02-04 DIAGNOSIS — E782 Mixed hyperlipidemia: Secondary | ICD-10-CM | POA: Diagnosis not present

## 2017-02-04 DIAGNOSIS — K269 Duodenal ulcer, unspecified as acute or chronic, without hemorrhage or perforation: Secondary | ICD-10-CM | POA: Diagnosis not present

## 2017-02-04 DIAGNOSIS — I10 Essential (primary) hypertension: Secondary | ICD-10-CM | POA: Diagnosis not present

## 2017-03-08 IMAGING — DX DG CHEST 1V PORT
1 series · 1 of 1 positions shown · non-contrast
Comparison: 05/01/2014

CLINICAL DATA: Intermittent chest pain for 2 days, shortness of
Breath

EXAM:
PORTABLE CHEST 1 VIEW

[chest ap]
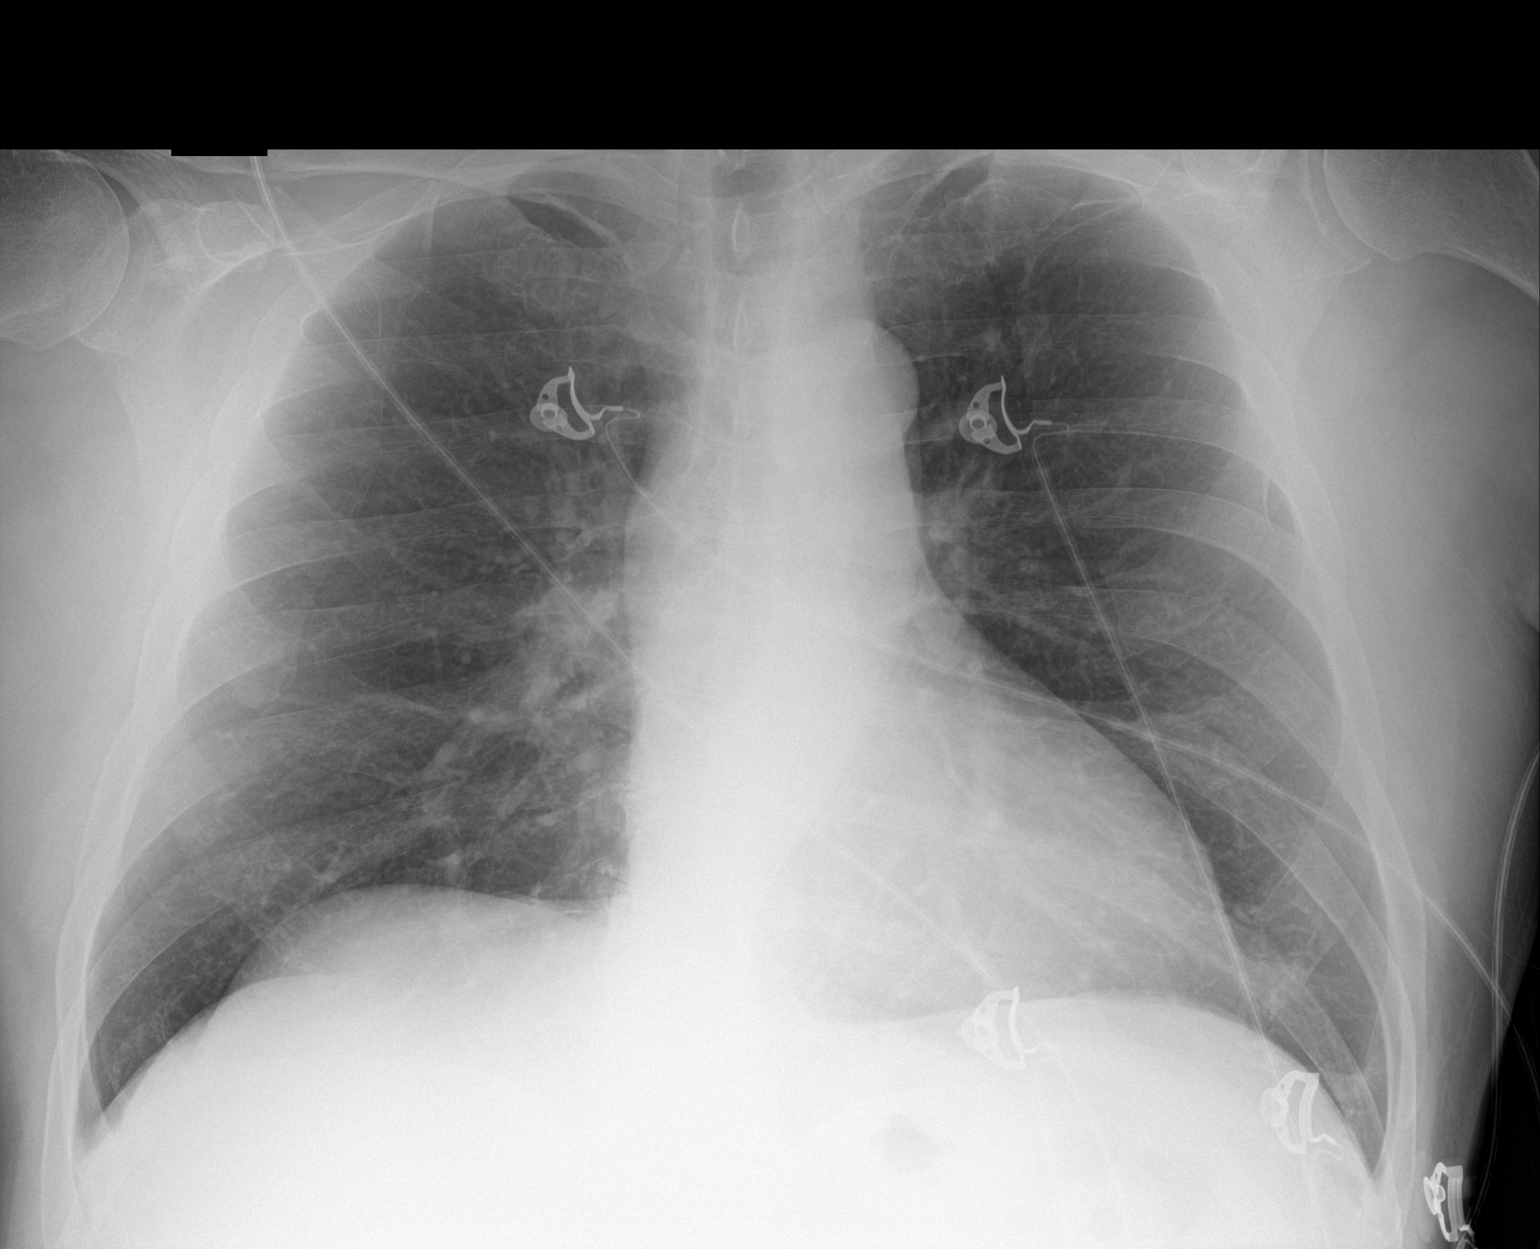

[1 of 1 positions shown; findings below may reference images not displayed]

FINDINGS: Cardiomediastinal silhouette is stable. No acute infiltrate or
pleural effusion. No pulmonary edema. Bony thorax is unremarkable.
IMPRESSION: No active disease.

## 2017-04-07 DIAGNOSIS — E782 Mixed hyperlipidemia: Secondary | ICD-10-CM | POA: Diagnosis not present

## 2017-04-07 DIAGNOSIS — I10 Essential (primary) hypertension: Secondary | ICD-10-CM | POA: Diagnosis not present

## 2017-04-07 DIAGNOSIS — I340 Nonrheumatic mitral (valve) insufficiency: Secondary | ICD-10-CM | POA: Diagnosis not present

## 2017-07-11 DIAGNOSIS — M549 Dorsalgia, unspecified: Secondary | ICD-10-CM | POA: Diagnosis not present

## 2017-07-11 DIAGNOSIS — M064 Inflammatory polyarthropathy: Secondary | ICD-10-CM | POA: Diagnosis not present

## 2017-07-11 DIAGNOSIS — I2584 Coronary atherosclerosis due to calcified coronary lesion: Secondary | ICD-10-CM | POA: Diagnosis not present

## 2017-07-11 DIAGNOSIS — I10 Essential (primary) hypertension: Secondary | ICD-10-CM | POA: Diagnosis not present

## 2017-07-11 DIAGNOSIS — K279 Peptic ulcer, site unspecified, unspecified as acute or chronic, without hemorrhage or perforation: Secondary | ICD-10-CM | POA: Diagnosis not present

## 2017-07-11 DIAGNOSIS — F331 Major depressive disorder, recurrent, moderate: Secondary | ICD-10-CM | POA: Diagnosis not present

## 2017-07-11 DIAGNOSIS — Z23 Encounter for immunization: Secondary | ICD-10-CM | POA: Diagnosis not present

## 2017-08-02 DIAGNOSIS — R0602 Shortness of breath: Secondary | ICD-10-CM | POA: Diagnosis not present

## 2017-08-02 DIAGNOSIS — E782 Mixed hyperlipidemia: Secondary | ICD-10-CM | POA: Diagnosis not present

## 2017-08-02 DIAGNOSIS — I10 Essential (primary) hypertension: Secondary | ICD-10-CM | POA: Diagnosis not present

## 2017-08-02 DIAGNOSIS — I2510 Atherosclerotic heart disease of native coronary artery without angina pectoris: Secondary | ICD-10-CM | POA: Diagnosis not present

## 2017-11-08 ENCOUNTER — Ambulatory Visit: Payer: PPO | Admitting: Nurse Practitioner

## 2017-11-08 ENCOUNTER — Encounter: Payer: Self-pay | Admitting: Nurse Practitioner

## 2017-11-08 VITALS — BP 122/90 | HR 60 | Resp 16 | Ht 69.0 in | Wt 227.0 lb

## 2017-11-08 DIAGNOSIS — K257 Chronic gastric ulcer without hemorrhage or perforation: Secondary | ICD-10-CM | POA: Diagnosis not present

## 2017-11-08 DIAGNOSIS — E782 Mixed hyperlipidemia: Secondary | ICD-10-CM

## 2017-11-08 DIAGNOSIS — F411 Generalized anxiety disorder: Secondary | ICD-10-CM

## 2017-11-08 DIAGNOSIS — I10 Essential (primary) hypertension: Secondary | ICD-10-CM

## 2017-11-08 DIAGNOSIS — M545 Low back pain, unspecified: Secondary | ICD-10-CM

## 2017-11-08 MED ORDER — TIZANIDINE HCL 4 MG PO TABS: 4.0000 mg | ORAL_TABLET | Freq: Three times a day (TID) | ORAL | 3 refills | Status: DC | PRN

## 2017-11-08 MED ORDER — BUSPIRONE HCL 7.5 MG PO TABS: 7.5000 mg | ORAL_TABLET | Freq: Two times a day (BID) | ORAL | 3 refills | Status: DC

## 2017-11-08 MED ORDER — PANTOPRAZOLE SODIUM 40 MG PO TBEC: 40.0000 mg | DELAYED_RELEASE_TABLET | Freq: Two times a day (BID) | ORAL | 3 refills | Status: DC

## 2017-11-08 MED ORDER — TRAMADOL HCL 50 MG PO TABS
50.0000 mg | ORAL_TABLET | Freq: Two times a day (BID) | ORAL | 3 refills | Status: DC | PRN
Start: 2017-11-08 — End: 2018-03-21

## 2017-11-08 MED ORDER — ROSUVASTATIN CALCIUM 20 MG PO TABS: 20.0000 mg | ORAL_TABLET | Freq: Every day | ORAL | 5 refills | Status: DC

## 2017-11-08 MED ORDER — BENAZEPRIL HCL 20 MG PO TABS: 20.0000 mg | ORAL_TABLET | Freq: Every day | ORAL | 5 refills | Status: DC

## 2017-11-08 MED ORDER — DULOXETINE HCL 60 MG PO CPEP: 60.0000 mg | ORAL_CAPSULE | Freq: Every day | ORAL | 5 refills | Status: DC

## 2017-11-08 NOTE — Progress Notes (Signed)
[facility] [address] [place], [place] [address]  Internal MEDICINE  Office Visit Note  Patient Name: Howard Weaver  [id number]  [id number]  Date of Service: 11/27/2017  Chief Complaint  Patient presents with  . Hypertension  . Back Pain    The patient is here for routine follow up exam. He continues to have lower back and bilateral hip pain. He is unable to take anti-inflammatory medications due to history of peptic ulcer. He does take tramadol when needed for mild to moderate pain. He also takes muscle relaxer when needed to help soothe tight and sore muscles. He has no new complaints today. He does need to have refills of all regular medications.    Pt is here for routine follow up.    Current Medication: Outpatient Encounter Medications as of 11/08/2017  Medication Sig  . benazepril (LOTENSIN) 20 MG tablet Take 1 tablet (20 mg total) by mouth daily.  . busPIRone (BUSPAR) 7.5 MG tablet Take 1 tablet (7.5 mg total) by mouth 2 (two) times daily.  . cloNIDine (CATAPRES) 0.3 MG tablet Take 0.3 mg by mouth 2 (two) times daily.  . DULoxetine (CYMBALTA) 60 MG capsule Take 1 capsule (60 mg total) by mouth daily.  . hydrochlorothiazide (HYDRODIURIL) 25 MG tablet Take 25 mg by mouth daily.  . isosorbide mononitrate (IMDUR) 30 MG 24 hr tablet Take 30 mg by mouth daily.  . metoprolol succinate (TOPROL-XL) 100 MG 24 hr tablet Take by mouth.  . pantoprazole (PROTONIX) 40 MG tablet Take 1 tablet (40 mg total) by mouth 2 (two) times daily.  . traMADol (ULTRAM) 50 MG tablet Take 1 tablet (50 mg total) by mouth every 12 (twelve) hours as needed.  . [DISCONTINUED] benazepril (LOTENSIN) 20 MG tablet Take 1 tablet by mouth daily.  . [DISCONTINUED] busPIRone (BUSPAR) 7.5 MG tablet Take 7.5 mg by mouth 2 (two) times daily.  . [DISCONTINUED] DULoxetine (CYMBALTA) 60 MG capsule Take 1 capsule by mouth daily.  . [DISCONTINUED] pantoprazole (PROTONIX) 40 MG tablet Take 1 tablet (40 mg  total) by mouth 2 (two) times daily.  . [DISCONTINUED] tiZANidine (ZANAFLEX) 4 MG capsule Take 4 mg by mouth 3 (three) times daily.  . [DISCONTINUED] traMADol (ULTRAM) 50 MG tablet take 1 tablet by mouth three times a day if needed for pain  . rosuvastatin (CRESTOR) 20 MG tablet Take 1 tablet (20 mg total) by mouth daily.  Marland Kitchen tiZANidine (ZANAFLEX) 4 MG tablet Take 1 tablet (4 mg total) by mouth every 8 (eight) hours as needed for muscle spasms.  . [DISCONTINUED] HYDROcodone-acetaminophen (NORCO/VICODIN) 5-325 MG tablet Take 1 tablet by mouth 2 (two) times daily as needed.  . [DISCONTINUED] metoprolol (LOPRESSOR) 100 MG tablet Take 100 mg by mouth 2 (two) times daily.  . [DISCONTINUED] rosuvastatin (CRESTOR) 40 MG tablet Take 40 mg by mouth daily.    No facility-administered encounter medications on file as of 11/08/2017.     Surgical History: Past Surgical History:  Procedure Laterality Date  . BRAIN SURGERY    . CARDIAC CATHETERIZATION Right 11/25/2015   Procedure: Left Heart Cath and Coronary Angiography;  Surgeon: Laurier Nancy, MD;  Location: [facility] INVASIVE CV LAB;  Service: Cardiovascular;  Laterality: Right;    Medical History: Past Medical History:  Diagnosis Date  . CAD (coronary artery disease)   . Chest pain   . DU (duodenal ulcer)   . GAD (generalized anxiety disorder)   . Hypertension   . Myocardial infarction (HCC)   .  Radiculopathy of thoracolumbar region   . SVT (supraventricular tachycardia) (HCC)   . Tachycardia     Family History: Family History  Problem Relation Age of Onset  . Hypertension Mother   . Diabetes Mother   . Depression Mother   . Hypertension Father     Social History   Socioeconomic History  . Marital status: Married    Spouse name: Not on file  . Number of children: Not on file  . Years of education: Not on file  . Highest education level: Not on file  Occupational History  . Not on file  Social Needs  . Financial resource strain:  Not on file  . Food insecurity:    Worry: Not on file    Inability: Not on file  . Transportation needs:    Medical: Not on file    Non-medical: Not on file  Tobacco Use  . Smoking status: Former Smoker    Last attempt to quit: 11/25/1975    Years since quitting: 42.0  . Smokeless tobacco: Never Used  Substance and Sexual Activity  . Alcohol use: No  . Drug use: No  . Sexual activity: Not on file  Lifestyle  . Physical activity:    Days per week: Not on file    Minutes per session: Not on file  . Stress: Not on file  Relationships  . Social connections:    Talks on phone: Not on file    Gets together: Not on file    Attends religious service: Not on file    Active member of club or organization: Not on file    Attends meetings of clubs or organizations: Not on file    Relationship status: Not on file  . Intimate partner violence:    Fear of current or ex partner: Not on file    Emotionally abused: Not on file    Physically abused: Not on file    Forced sexual activity: Not on file  Other Topics Concern  . Not on file  Social History Narrative  . Not on file      Review of Systems  Constitutional: Negative for activity change, chills, fatigue and unexpected weight change.  HENT: Negative for congestion, postnasal drip, rhinorrhea, sneezing and sore throat.   Eyes: Negative.  Negative for redness.  Respiratory: Negative for cough, chest tightness and shortness of breath.   Cardiovascular: Negative for chest pain and palpitations.  Gastrointestinal: Negative for abdominal pain, constipation, diarrhea, nausea and vomiting.       Chronic peptic ulcer disease.   Endocrine: Negative for cold intolerance, heat intolerance, polydipsia, polyphagia and polyuria.  Genitourinary: Negative.  Negative for dysuria and frequency.  Musculoskeletal: Positive for arthralgias, back pain and myalgias. Negative for joint swelling and neck pain.  Skin: Negative for rash.   Allergic/Immunologic: Negative for environmental allergies.  Neurological: Negative.  Negative for tremors and numbness.  Hematological: Negative for adenopathy. Does not bruise/bleed easily.  Psychiatric/Behavioral: Negative for behavioral problems (Depression), sleep disturbance and suicidal ideas. The patient is not nervous/anxious.     Today's Vitals   11/08/17 1057  BP: 122/90  Pulse: 60  Resp: 16  SpO2: 97%  Weight: 227 lb (103 kg)  Height: 5\' 9"  (1.753 m)    Physical Exam  Constitutional: He is oriented to person, place, and time. He appears well-developed and well-nourished. No distress.  HENT:  Head: Normocephalic and atraumatic.  Mouth/Throat: Oropharynx is clear and moist. No oropharyngeal exudate.  Eyes: Pupils are  equal, round, and reactive to light. EOM are normal.  Neck: Normal range of motion. Neck supple. No JVD present. Carotid bruit is not present. No tracheal deviation present. No thyromegaly present.  Cardiovascular: Normal rate, regular rhythm and normal heart sounds. Exam reveals no gallop and no friction rub.  No murmur heard. Mildly irregular heart rhythm.   Pulmonary/Chest: Effort normal and breath sounds normal. No respiratory distress. He has no wheezes. He has no rales. He exhibits no tenderness.  Abdominal: Soft. Bowel sounds are normal. There is no tenderness.  Musculoskeletal: Normal range of motion.  Lymphadenopathy:    He has no cervical adenopathy.  Neurological: He is alert and oriented to person, place, and time. No cranial nerve deficit.  Skin: Skin is warm and dry. He is not diaphoretic.  Psychiatric: He has a normal mood and affect. His behavior is normal. Judgment and thought content normal.  Nursing note and vitals reviewed.  Assessment/Plan: 1. Essential hypertension Stable. Continue bp medications as prescribed  - benazepril (LOTENSIN) 20 MG tablet; Take 1 tablet (20 mg total) by mouth daily.  Dispense: 30 tablet; Refill: 5  2.  Mixed hyperlipidemia Fasting lipid pane ordered. Will adjust crestor as indicated.  - rosuvastatin (CRESTOR) 20 MG tablet; Take 1 tablet (20 mg total) by mouth daily.  Dispense: 30 tablet; Refill: 5  3. Peptic ulcer of stomach, chronic Stable.  - pantoprazole (PROTONIX) 40 MG tablet; Take 1 tablet (40 mg total) by mouth 2 (two) times daily.  Dispense: 60 tablet; Refill: 3  4. Low back pain without sciatica, unspecified back pain laterality, unspecified chronicity May continue to take tramadol 50mg  twice daily as needed for mild to moderate pain. Tizanidine 4mg  three times daily if needed for muscle pain/spasms. Refills provided for both today. - traMADol (ULTRAM) 50 MG tablet; Take 1 tablet (50 mg total) by mouth every 12 (twelve) hours as needed.  Dispense: 60 tablet; Refill: 3 - tiZANidine (ZANAFLEX) 4 MG tablet; Take 1 tablet (4 mg total) by mouth every 8 (eight) hours as needed for muscle spasms.  Dispense: 90 tablet; Refill: 3  5. GAD (generalized anxiety disorder) - DULoxetine (CYMBALTA) 60 MG capsule; Take 1 capsule (60 mg total) by mouth daily.  Dispense: 30 capsule; Refill: 5 - busPIRone (BUSPAR) 7.5 MG tablet; Take 1 tablet (7.5 mg total) by mouth 2 (two) times daily.  Dispense: 60 tablet; Refill: 3   General Counseling: Dymir verbalizes understanding of the findings of todays visit and agrees with plan of treatment. I have discussed any further diagnostic evaluation that may be needed or ordered today. We also reviewed his medications today. he has been encouraged to call the office with any questions or concerns that should arise related to todays visit.   This patient was seen by Vincent Gros, FNP- C in Collaboration with Dr Lyndon Code as a part of collaborative care agreement      Meds ordered this encounter  Medications  . rosuvastatin (CRESTOR) 20 MG tablet    Sig: Take 1 tablet (20 mg total) by mouth daily.    Dispense:  30 tablet    Refill:  5    Order  Specific Question:   Supervising Provider    Answer:   Lyndon Code [[id number]]  . traMADol (ULTRAM) 50 MG tablet    Sig: Take 1 tablet (50 mg total) by mouth every 12 (twelve) hours as needed.    Dispense:  60 tablet    Refill:  3  Order Specific Question:   Supervising Provider    Answer:   Lyndon Code [[id number]]  . tiZANidine (ZANAFLEX) 4 MG tablet    Sig: Take 1 tablet (4 mg total) by mouth every 8 (eight) hours as needed for muscle spasms.    Dispense:  90 tablet    Refill:  3    Order Specific Question:   Supervising Provider    Answer:   Lyndon Code [[id number]]  . pantoprazole (PROTONIX) 40 MG tablet    Sig: Take 1 tablet (40 mg total) by mouth 2 (two) times daily.    Dispense:  60 tablet    Refill:  3    Order Specific Question:   Supervising Provider    Answer:   Lyndon Code [[id number]]  . DULoxetine (CYMBALTA) 60 MG capsule    Sig: Take 1 capsule (60 mg total) by mouth daily.    Dispense:  30 capsule    Refill:  5    Order Specific Question:   Supervising Provider    Answer:   Lyndon Code [[id number]]  . busPIRone (BUSPAR) 7.5 MG tablet    Sig: Take 1 tablet (7.5 mg total) by mouth 2 (two) times daily.    Dispense:  60 tablet    Refill:  3    Order Specific Question:   Supervising Provider    Answer:   Lyndon Code [[id number]]  . benazepril (LOTENSIN) 20 MG tablet    Sig: Take 1 tablet (20 mg total) by mouth daily.    Dispense:  30 tablet    Refill:  5    Order Specific Question:   Supervising Provider    Answer:   Lyndon Code [[id number]]    Time spent: 44 Minutes    Dr Lyndon Code Internal medicine

## 2017-11-27 DIAGNOSIS — K257 Chronic gastric ulcer without hemorrhage or perforation: Secondary | ICD-10-CM | POA: Insufficient documentation

## 2017-11-27 DIAGNOSIS — M545 Low back pain, unspecified: Secondary | ICD-10-CM | POA: Insufficient documentation

## 2017-11-27 DIAGNOSIS — I10 Essential (primary) hypertension: Secondary | ICD-10-CM | POA: Insufficient documentation

## 2017-11-27 DIAGNOSIS — E782 Mixed hyperlipidemia: Secondary | ICD-10-CM | POA: Insufficient documentation

## 2018-02-07 ENCOUNTER — Telehealth: Payer: Self-pay | Admitting: Nurse Practitioner

## 2018-02-27 NOTE — Telephone Encounter (Signed)
Patient paperwork ready. Howard Weaver °

## 2018-03-21 ENCOUNTER — Ambulatory Visit (INDEPENDENT_AMBULATORY_CARE_PROVIDER_SITE_OTHER): Payer: PPO | Admitting: Nurse Practitioner

## 2018-03-21 ENCOUNTER — Encounter: Payer: Self-pay | Admitting: Nurse Practitioner

## 2018-03-21 VITALS — BP 144/103 | HR 56 | Resp 16 | Ht 69.0 in | Wt 214.6 lb

## 2018-03-21 DIAGNOSIS — I10 Essential (primary) hypertension: Secondary | ICD-10-CM | POA: Diagnosis not present

## 2018-03-21 DIAGNOSIS — F411 Generalized anxiety disorder: Secondary | ICD-10-CM | POA: Diagnosis not present

## 2018-03-21 DIAGNOSIS — Z0001 Encounter for general adult medical examination with abnormal findings: Secondary | ICD-10-CM

## 2018-03-21 DIAGNOSIS — M545 Low back pain, unspecified: Secondary | ICD-10-CM

## 2018-03-21 DIAGNOSIS — Z125 Encounter for screening for malignant neoplasm of prostate: Secondary | ICD-10-CM

## 2018-03-21 DIAGNOSIS — E782 Mixed hyperlipidemia: Secondary | ICD-10-CM

## 2018-03-21 MED ORDER — TRAMADOL HCL 50 MG PO TABS: 50.0000 mg | ORAL_TABLET | Freq: Two times a day (BID) | ORAL | 3 refills | Status: DC | PRN

## 2018-03-21 MED ORDER — BUSPIRONE HCL 7.5 MG PO TABS: 7.5000 mg | ORAL_TABLET | Freq: Two times a day (BID) | ORAL | 3 refills | Status: DC

## 2018-03-21 MED ORDER — TIZANIDINE HCL 4 MG PO TABS: 4.0000 mg | ORAL_TABLET | Freq: Three times a day (TID) | ORAL | 3 refills | Status: DC | PRN

## 2018-03-21 MED ORDER — ROSUVASTATIN CALCIUM 20 MG PO TABS: 20.0000 mg | ORAL_TABLET | Freq: Every day | ORAL | 5 refills | Status: DC

## 2018-03-21 MED ORDER — DULOXETINE HCL 60 MG PO CPEP: 60.0000 mg | ORAL_CAPSULE | Freq: Every day | ORAL | 5 refills | Status: DC

## 2018-03-21 NOTE — Progress Notes (Signed)
[facility] [address] [place], [place] [id number]  Internal MEDICINE  Office Visit Note  Patient Name: Howard Weaver  [id number]  [id number]  Date of Service: 04/09/2018  Chief Complaint  Patient presents with  . Hypertension    4 month follow up    Hypertension  This is a chronic problem. The current episode started more than 1 year ago. The problem is unchanged. The problem is controlled. Pertinent negatives include no chest pain, headaches, neck pain, palpitations or shortness of breath. There are no associated agents to hypertension. Risk factors for coronary artery disease include dyslipidemia, male gender, obesity and sedentary lifestyle. Past treatments include beta blockers, diuretics, central alpha agonists and ACE inhibitors. Compliance problems include exercise.  Hypertensive end-organ damage includes CAD/MI.       Current Medication: Outpatient Encounter Medications as of 03/21/2018  Medication Sig  . benazepril (LOTENSIN) 20 MG tablet Take 1 tablet (20 mg total) by mouth daily.  . busPIRone (BUSPAR) 7.5 MG tablet Take 1 tablet (7.5 mg total) by mouth 2 (two) times daily.  . cloNIDine (CATAPRES) 0.3 MG tablet Take 0.3 mg by mouth 2 (two) times daily.  . DULoxetine (CYMBALTA) 60 MG capsule Take 1 capsule (60 mg total) by mouth daily.  . hydrochlorothiazide (HYDRODIURIL) 25 MG tablet Take 25 mg by mouth daily.  . isosorbide mononitrate (IMDUR) 30 MG 24 hr tablet Take 30 mg by mouth daily.  . metoprolol succinate (TOPROL-XL) 100 MG 24 hr tablet Take by mouth.  . rosuvastatin (CRESTOR) 20 MG tablet Take 1 tablet (20 mg total) by mouth daily.  Marland Kitchen. tiZANidine (ZANAFLEX) 4 MG tablet Take 1 tablet (4 mg total) by mouth every 8 (eight) hours as needed for muscle spasms.  . traMADol (ULTRAM) 50 MG tablet Take 1 tablet (50 mg total) by mouth every 12 (twelve) hours as needed.  . [DISCONTINUED] busPIRone (BUSPAR) 7.5 MG tablet Take 1 tablet (7.5 mg total) by mouth 2  (two) times daily.  . [DISCONTINUED] busPIRone (BUSPAR) 7.5 MG tablet Take 1 tablet (7.5 mg total) by mouth 2 (two) times daily.  . [DISCONTINUED] DULoxetine (CYMBALTA) 60 MG capsule Take 1 capsule (60 mg total) by mouth daily.  . [DISCONTINUED] DULoxetine (CYMBALTA) 60 MG capsule Take 1 capsule (60 mg total) by mouth daily.  . [DISCONTINUED] pantoprazole (PROTONIX) 40 MG tablet Take 1 tablet (40 mg total) by mouth 2 (two) times daily.  . [DISCONTINUED] rosuvastatin (CRESTOR) 20 MG tablet Take 1 tablet (20 mg total) by mouth daily.  . [DISCONTINUED] tiZANidine (ZANAFLEX) 4 MG tablet Take 1 tablet (4 mg total) by mouth every 8 (eight) hours as needed for muscle spasms.  . [DISCONTINUED] tiZANidine (ZANAFLEX) 4 MG tablet Take 1 tablet (4 mg total) by mouth every 8 (eight) hours as needed for muscle spasms.  . [DISCONTINUED] traMADol (ULTRAM) 50 MG tablet Take 1 tablet (50 mg total) by mouth every 12 (twelve) hours as needed.  . [DISCONTINUED] traMADol (ULTRAM) 50 MG tablet Take 1 tablet (50 mg total) by mouth every 12 (twelve) hours as needed.   No facility-administered encounter medications on file as of 03/21/2018.     Surgical History: Past Surgical History:  Procedure Laterality Date  . BRAIN SURGERY    . CARDIAC CATHETERIZATION Right 11/25/2015   Procedure: Left Heart Cath and Coronary Angiography;  Surgeon: Laurier NancyShaukat A Khan, MD;  Location: [facility] INVASIVE CV LAB;  Service: Cardiovascular;  Laterality: Right;    Medical History: Past Medical History:  Diagnosis Date  .  CAD (coronary artery disease)   . Chest pain   . DU (duodenal ulcer)   . GAD (generalized anxiety disorder)   . Hypertension   . Myocardial infarction (HCC)   . Radiculopathy of thoracolumbar region   . SVT (supraventricular tachycardia) (HCC)   . Tachycardia     Family History: Family History  Problem Relation Age of Onset  . Hypertension Mother   . Diabetes Mother   . Depression Mother   . Hypertension Father      Social History   Socioeconomic History  . Marital status: Married    Spouse name: Not on file  . Number of children: Not on file  . Years of education: Not on file  . Highest education level: Not on file  Occupational History  . Not on file  Social Needs  . Financial resource strain: Not on file  . Food insecurity:    Worry: Not on file    Inability: Not on file  . Transportation needs:    Medical: Not on file    Non-medical: Not on file  Tobacco Use  . Smoking status: Former Smoker    Last attempt to quit: 11/25/1975    Years since quitting: 42.4  . Smokeless tobacco: Never Used  Substance and Sexual Activity  . Alcohol use: No  . Drug use: No  . Sexual activity: Not on file  Lifestyle  . Physical activity:    Days per week: Not on file    Minutes per session: Not on file  . Stress: Not on file  Relationships  . Social connections:    Talks on phone: Not on file    Gets together: Not on file    Attends religious service: Not on file    Active member of club or organization: Not on file    Attends meetings of clubs or organizations: Not on file    Relationship status: Not on file  . Intimate partner violence:    Fear of current or ex partner: Not on file    Emotionally abused: Not on file    Physically abused: Not on file    Forced sexual activity: Not on file  Other Topics Concern  . Not on file  Social History Narrative  . Not on file      Review of Systems  Constitutional: Negative for activity change, chills, fatigue and unexpected weight change.  HENT: Negative for congestion, postnasal drip, rhinorrhea, sneezing and sore throat.   Eyes: Negative.  Negative for redness.  Respiratory: Negative for cough, chest tightness and shortness of breath.   Cardiovascular: Negative for chest pain and palpitations.  Gastrointestinal: Negative for abdominal pain, constipation, diarrhea, nausea and vomiting.       Chronic peptic ulcer disease.   Endocrine:  Negative for cold intolerance, heat intolerance, polydipsia, polyphagia and polyuria.  Genitourinary: Negative.  Negative for dysuria and frequency.  Musculoskeletal: Positive for arthralgias, back pain and myalgias. Negative for joint swelling and neck pain.  Skin: Negative for rash.  Allergic/Immunologic: Negative for environmental allergies.  Neurological: Negative for dizziness, tremors, light-headedness, numbness and headaches.  Hematological: Negative for adenopathy. Does not bruise/bleed easily.  Psychiatric/Behavioral: Negative for behavioral problems (Depression), sleep disturbance and suicidal ideas. The patient is not nervous/anxious.    Today's Vitals   03/21/18 0855  BP: (!) 144/103  Pulse: (!) 56  Resp: 16  SpO2: 98%  Weight: 214 lb 9.6 oz (97.3 kg)  Height: 5\' 9"  (1.753 m)    Physical  Exam  Constitutional: He is oriented to person, place, and time. He appears well-developed and well-nourished. No distress.  HENT:  Head: Normocephalic and atraumatic.  Nose: Nose normal.  Mouth/Throat: Oropharynx is clear and moist. No oropharyngeal exudate.  Eyes: Pupils are equal, round, and reactive to light. Conjunctivae and EOM are normal.  Neck: Normal range of motion. Neck supple. No JVD present. Carotid bruit is not present. No tracheal deviation present. No thyromegaly present.  Cardiovascular: Normal rate, regular rhythm and normal heart sounds. Exam reveals no gallop and no friction rub.  No murmur heard. Mildly irregular heart rhythm.   Pulmonary/Chest: Effort normal and breath sounds normal. No respiratory distress. He has no wheezes. He has no rales. He exhibits no tenderness.  Abdominal: Soft. Bowel sounds are normal. There is no tenderness.  Musculoskeletal: Normal range of motion.  Lymphadenopathy:    He has no cervical adenopathy.  Neurological: He is alert and oriented to person, place, and time. No cranial nerve deficit.  Skin: Skin is warm and dry. Capillary  refill takes less than 2 seconds. He is not diaphoretic.  Psychiatric: He has a normal mood and affect. His behavior is normal. Judgment and thought content normal.  Nursing note and vitals reviewed.   Assessment/Plan: 1. Essential hypertension Stable. Continue bp medication as prescribed.  - CBC with Differential/Platelet - Comprehensive metabolic panel  2. Mixed hyperlipidemia Check fasting lipid panel. Adjust crestor as prescribed.  - Lipid panel - rosuvastatin (CRESTOR) 20 MG tablet; Take 1 tablet (20 mg total) by mouth daily.  Dispense: 30 tablet; Refill: 5  3. Low back pain without sciatica, unspecified back pain laterality, unspecified chronicity Unable to take NSAIDs due to chronic peptic ulcer disease. May take tramadol 50mg  up to twice daily if needed for acute pain. Tizanidine 4mg  may be taken up to three times daily of needed for sore and tight muscles. Prescriptions for both were sent to his pharmacy.  - tiZANidine (ZANAFLEX) 4 MG tablet; Take 1 tablet (4 mg total) by mouth every 8 (eight) hours as needed for muscle spasms.  Dispense: 90 tablet; Refill: 3 - traMADol (ULTRAM) 50 MG tablet; Take 1 tablet (50 mg total) by mouth every 12 (twelve) hours as needed.  Dispense: 60 tablet; Refill: 3  4. GAD (generalized anxiety disorder) Well managed.  - busPIRone (BUSPAR) 7.5 MG tablet; Take 1 tablet (7.5 mg total) by mouth 2 (two) times daily.  Dispense: 60 tablet; Refill: 3 - DULoxetine (CYMBALTA) 60 MG capsule; Take 1 capsule (60 mg total) by mouth daily.  Dispense: 30 capsule; Refill: 5  5. Screening for prostate cancer - PSA   General Counseling: Zandon verbalizes understanding of the findings of todays visit and agrees with plan of treatment. I have discussed any further diagnostic evaluation that may be needed or ordered today. We also reviewed his medications today. he has been encouraged to call the office with any questions or concerns that should arise related to todays  visit.  Hypertension Counseling:   The following hypertensive lifestyle modification were recommended and discussed:  1. Limiting alcohol intake to less than 1 oz/day of ethanol:(24 oz of beer or 8 oz of wine or 2 oz of 100-proof whiskey). 2. Take baby ASA 81 mg daily. 3. Importance of regular aerobic exercise and losing weight. 4. Reduce dietary saturated fat and cholesterol intake for overall cardiovascular health. 5. Maintaining adequate dietary potassium, calcium, and magnesium intake. 6. Regular monitoring of the blood pressure. 7. Reduce sodium intake to less  than 100 mmol/day (less than 2.3 gm of sodium or less than 6 gm of sodium choride)   This patient was seen by Vincent Gros FNP Collaboration with Dr Lyndon Code as a part of collaborative care agreement  Orders Placed This Encounter  Procedures  . PSA  . CBC with Differential/Platelet  . Comprehensive metabolic panel  . Lipid panel  . TSH    Meds ordered this encounter  Medications  . DISCONTD: busPIRone (BUSPAR) 7.5 MG tablet    Sig: Take 1 tablet (7.5 mg total) by mouth 2 (two) times daily.    Dispense:  60 tablet    Refill:  3    Order Specific Question:   Supervising Provider    Answer:   Lyndon Code [[id number]]  . DISCONTD: DULoxetine (CYMBALTA) 60 MG capsule    Sig: Take 1 capsule (60 mg total) by mouth daily.    Dispense:  30 capsule    Refill:  5    Order Specific Question:   Supervising Provider    Answer:   Lyndon Code [[id number]]  . DISCONTD: tiZANidine (ZANAFLEX) 4 MG tablet    Sig: Take 1 tablet (4 mg total) by mouth every 8 (eight) hours as needed for muscle spasms.    Dispense:  90 tablet    Refill:  3    Order Specific Question:   Supervising Provider    Answer:   Lyndon Code [[id number]]  . DISCONTD: traMADol (ULTRAM) 50 MG tablet    Sig: Take 1 tablet (50 mg total) by mouth every 12 (twelve) hours as needed.    Dispense:  60 tablet    Refill:  3    Not new or acute prescription. Unable to take  NSAIDs due to peptic ulcer disease    Order Specific Question:   Supervising Provider    Answer:   Lyndon Code [[id number]]  . busPIRone (BUSPAR) 7.5 MG tablet    Sig: Take 1 tablet (7.5 mg total) by mouth 2 (two) times daily.    Dispense:  60 tablet    Refill:  3    Order Specific Question:   Supervising Provider    Answer:   Lyndon Code [[id number]]  . DULoxetine (CYMBALTA) 60 MG capsule    Sig: Take 1 capsule (60 mg total) by mouth daily.    Dispense:  30 capsule    Refill:  5    Order Specific Question:   Supervising Provider    Answer:   Lyndon Code [[id number]]  . rosuvastatin (CRESTOR) 20 MG tablet    Sig: Take 1 tablet (20 mg total) by mouth daily.    Dispense:  30 tablet    Refill:  5    Order Specific Question:   Supervising Provider    Answer:   Lyndon Code [[id number]]  . tiZANidine (ZANAFLEX) 4 MG tablet    Sig: Take 1 tablet (4 mg total) by mouth every 8 (eight) hours as needed for muscle spasms.    Dispense:  90 tablet    Refill:  3    Order Specific Question:   Supervising Provider    Answer:   Lyndon Code [[id number]]  . traMADol (ULTRAM) 50 MG tablet    Sig: Take 1 tablet (50 mg total) by mouth every 12 (twelve) hours as needed.    Dispense:  60 tablet    Refill:  3    Not new or acute prescription. Unable to take NSAIDs due  to peptic ulcer disease    Order Specific Question:   Supervising Provider    Answer:   Lyndon Code [[id number]]    Time spent: 70 Minutes      Dr Lyndon Code Internal medicine

## 2018-03-23 ENCOUNTER — Telehealth: Payer: Self-pay

## 2018-03-23 NOTE — Telephone Encounter (Signed)
[facility] ORDER # [id number] HAS EXPIRED BECAUSE IT HAS EXCEEDED 365 DAYS FROM THE INITIAL ORDER. PLEASE CONTACT LAB TO REORDER THIS TEST IF CLINICALLY INDICATED.

## 2018-03-30 ENCOUNTER — Other Ambulatory Visit: Payer: Self-pay

## 2018-03-30 DIAGNOSIS — K257 Chronic gastric ulcer without hemorrhage or perforation: Secondary | ICD-10-CM

## 2018-03-30 MED ORDER — PANTOPRAZOLE SODIUM 40 MG PO TBEC: 40.0000 mg | DELAYED_RELEASE_TABLET | Freq: Two times a day (BID) | ORAL | 3 refills | Status: DC

## 2018-04-09 DIAGNOSIS — Z0001 Encounter for general adult medical examination with abnormal findings: Secondary | ICD-10-CM | POA: Insufficient documentation

## 2018-05-05 IMAGING — CR DG CHEST 2V
2 series · 2 of 2 positions shown · non-contrast
Comparison: 11/25/2015, 05/01/2014

CLINICAL DATA: Chest pain

EXAM:
CHEST  2 VIEW

[chest pa]
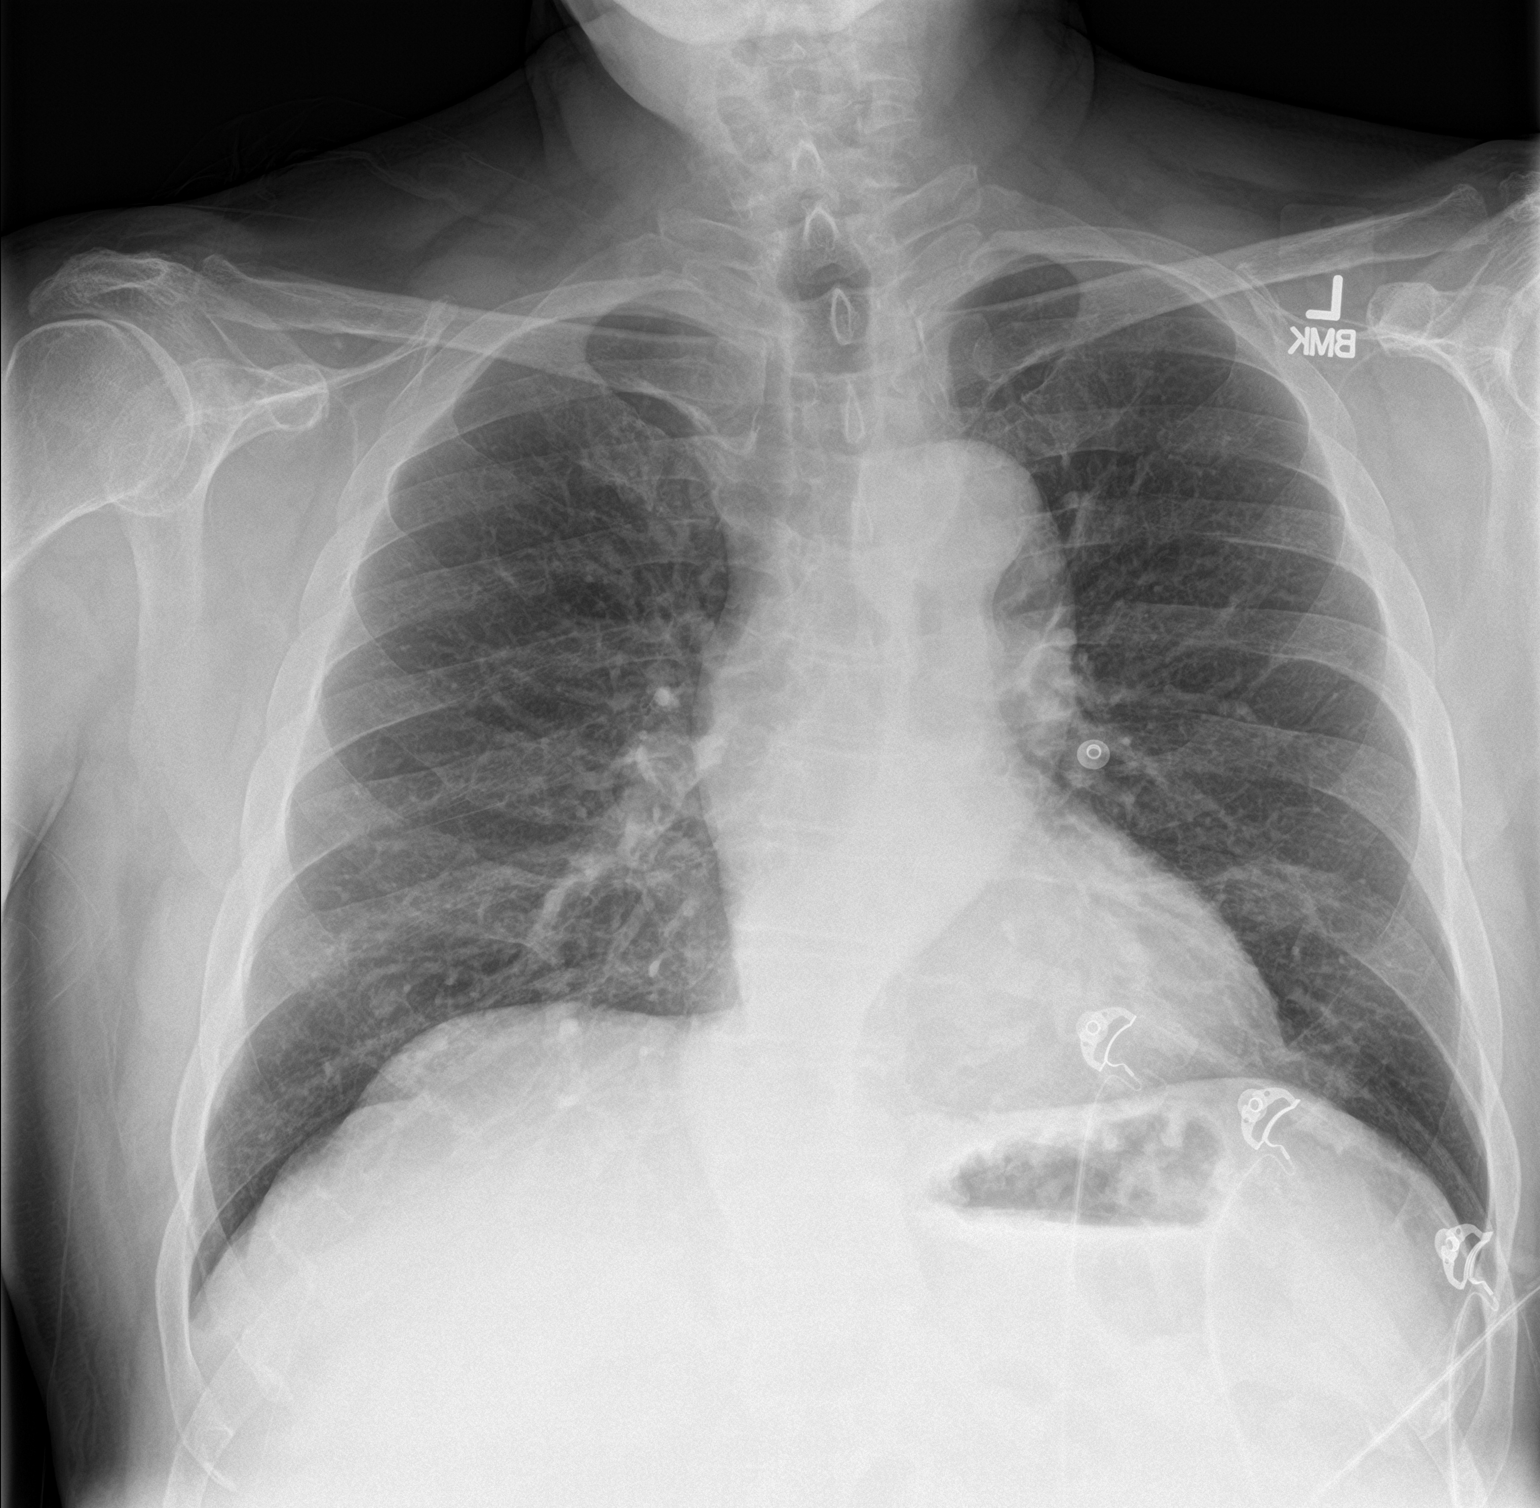

[chest lat]
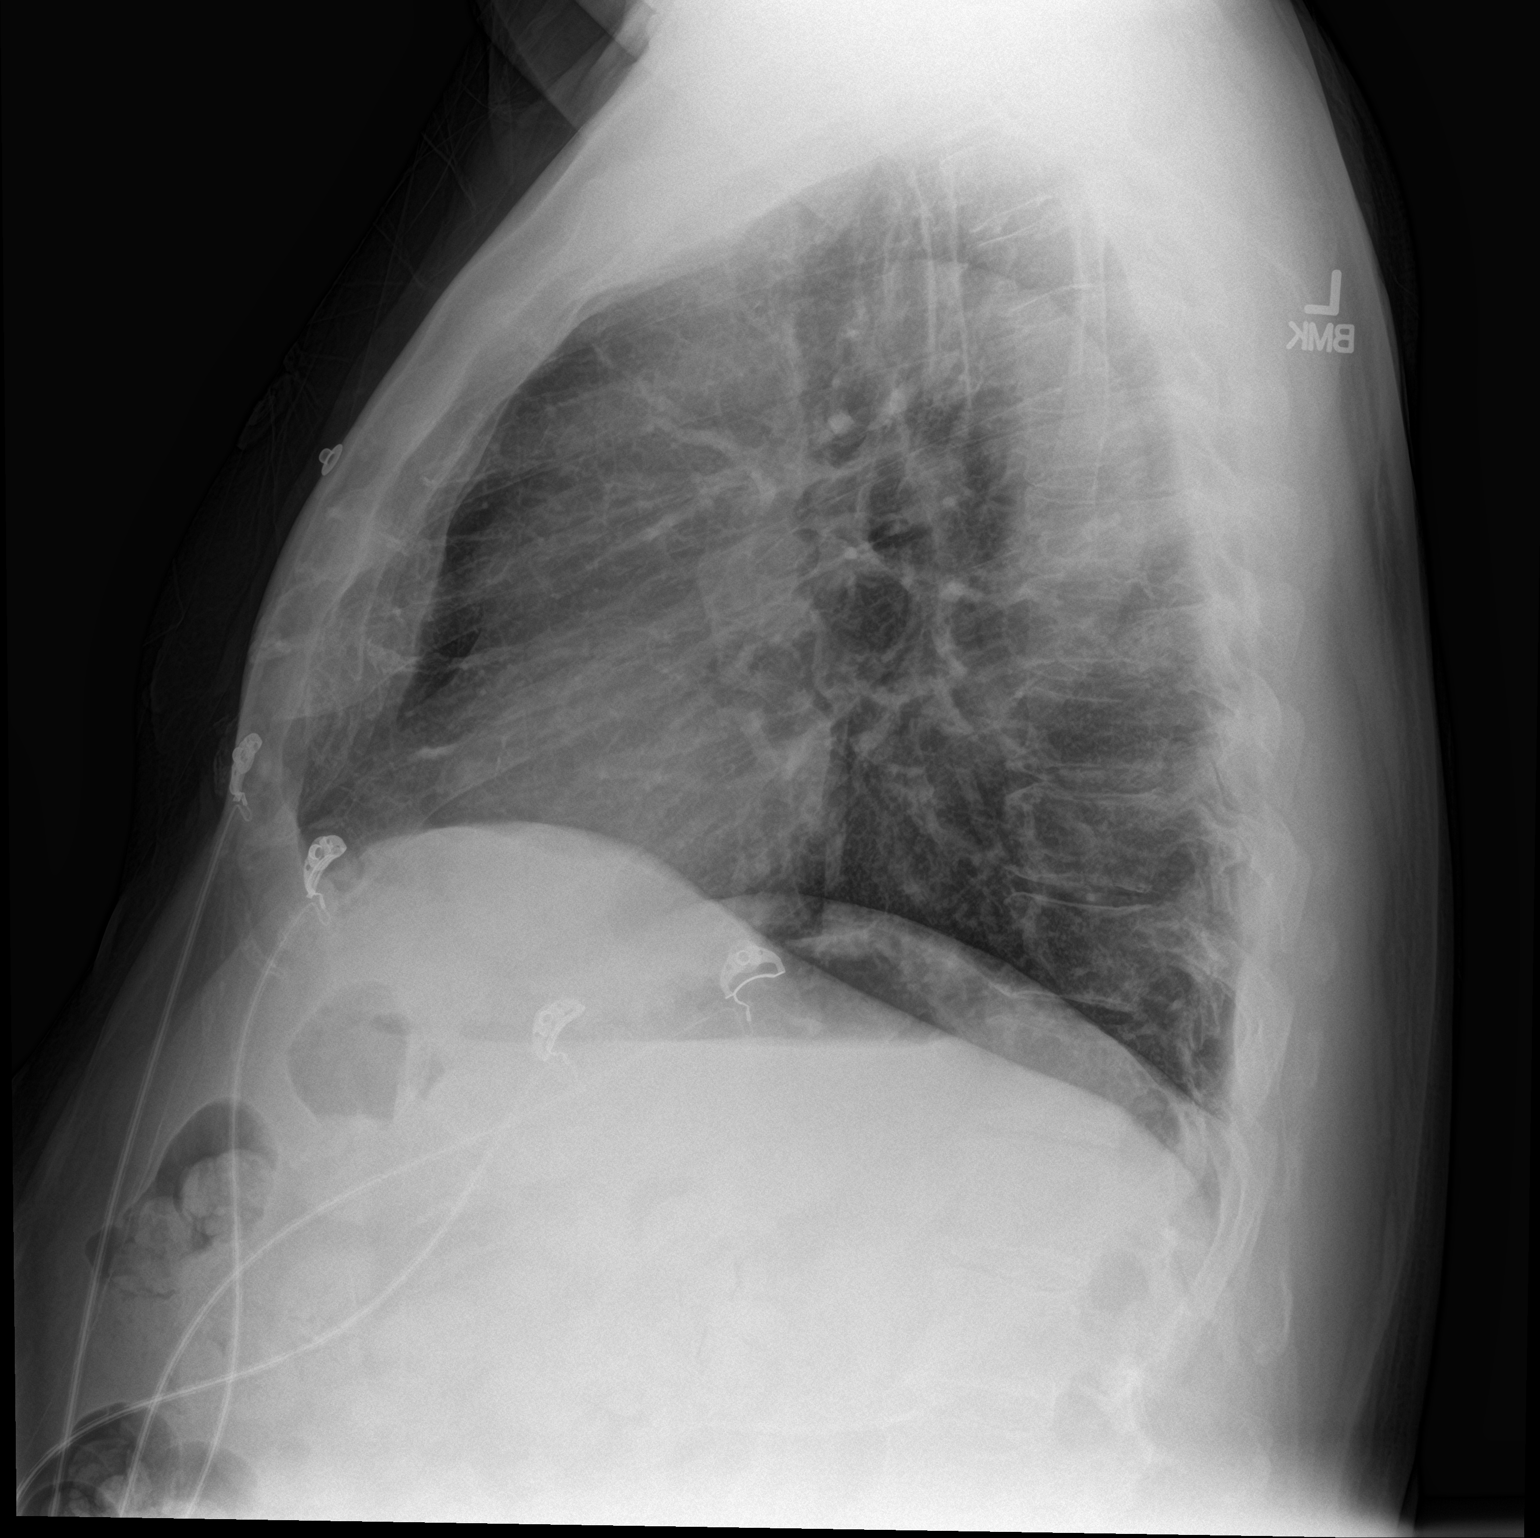

[2 of 2 positions shown; findings below may reference images not displayed]

FINDINGS: No acute pulmonary infiltrate, consolidation, or pleural effusion.
Cardiomediastinal silhouette stable with tortuous aorta. No
pneumothorax.
IMPRESSION: No active cardiopulmonary disease.

## 2018-06-08 ENCOUNTER — Other Ambulatory Visit
Admission: RE | Admit: 2018-06-08 | Discharge: 2018-06-08 | Disposition: A | Discharge status: Home / Self Care | Payer: PPO | Source: Ambulatory Visit | Attending: Nurse Practitioner | Admitting: Nurse Practitioner

## 2018-06-08 DIAGNOSIS — E7800 Pure hypercholesterolemia, unspecified: Secondary | ICD-10-CM | POA: Insufficient documentation

## 2018-06-08 DIAGNOSIS — I10 Essential (primary) hypertension: Secondary | ICD-10-CM | POA: Diagnosis not present

## 2018-06-08 DIAGNOSIS — Z125 Encounter for screening for malignant neoplasm of prostate: Secondary | ICD-10-CM | POA: Insufficient documentation

## 2018-06-08 LAB — LIPID PANEL
CHOL/HDL RATIO: 3.5 ratio
CHOLESTEROL: 128 mg/dL (ref 0–200)
HDL: 37 mg/dL — AB (ref >40)
LDL Cholesterol: 79 mg/dL (ref 0–99)
TRIGLYCERIDES: 59 mg/dL (ref <150)
VLDL: 12 mg/dL (ref 0–40)

## 2018-06-08 LAB — COMPREHENSIVE METABOLIC PANEL
ALT: 13 U/L (ref 0–44)
ANION GAP: 9 (ref 5–15)
AST: 17 U/L (ref 15–41)
Albumin: 3.9 g/dL (ref 3.5–5.0)
Alkaline Phosphatase: 51 U/L (ref 38–126)
BUN: 18 mg/dL (ref 8–23)
CO2: 30 mmol/L (ref 22–32)
Calcium: 9.8 mg/dL (ref 8.9–10.3)
Chloride: 103 mmol/L (ref 98–111)
Creatinine, Ser: 1.31 mg/dL — ABNORMAL HIGH (ref 0.61–1.24)
GFR, EST NON AFRICAN AMERICAN: 56 mL/min — AB (ref >60)
Glucose, Bld: 133 mg/dL — ABNORMAL HIGH (ref 70–99)
POTASSIUM: 4.5 mmol/L (ref 3.5–5.1)
Sodium: 142 mmol/L (ref 135–145)
Total Bilirubin: 0.7 mg/dL (ref 0.3–1.2)
Total Protein: 7.7 g/dL (ref 6.5–8.1)

## 2018-06-08 LAB — CBC
HCT: 45.9 % (ref 39.0–52.0)
Hemoglobin: 15 g/dL (ref 13.0–17.0)
MCH: 29.2 pg (ref 26.0–34.0)
MCHC: 32.7 g/dL (ref 30.0–36.0)
MCV: 89.3 fL (ref 80.0–100.0)
NRBC: 0 % (ref 0.0–0.2)
PLATELETS: 212 10*3/uL (ref 150–400)
RBC: 5.14 MIL/uL (ref 4.22–5.81)
RDW: 14 % (ref 11.5–15.5)
WBC: 6.2 10*3/uL (ref 4.0–10.5)

## 2018-06-08 LAB — TSH: TSH: 2.69 u[IU]/mL (ref 0.350–4.500)

## 2018-06-08 LAB — T4, FREE: Free T4: 1.01 ng/dL (ref 0.82–1.77)

## 2018-06-08 LAB — PSA: Prostatic Specific Antigen: 0.92 ng/mL (ref 0.00–4.00)

## 2018-07-24 ENCOUNTER — Ambulatory Visit (INDEPENDENT_AMBULATORY_CARE_PROVIDER_SITE_OTHER): Payer: PPO | Admitting: Adult Health

## 2018-07-24 ENCOUNTER — Encounter: Payer: Self-pay | Admitting: Adult Health

## 2018-07-24 VITALS — BP 104/68 | HR 77 | Resp 16 | Ht 69.0 in | Wt 208.0 lb

## 2018-07-24 DIAGNOSIS — E782 Mixed hyperlipidemia: Secondary | ICD-10-CM

## 2018-07-24 DIAGNOSIS — I10 Essential (primary) hypertension: Secondary | ICD-10-CM | POA: Diagnosis not present

## 2018-07-24 DIAGNOSIS — Z0001 Encounter for general adult medical examination with abnormal findings: Secondary | ICD-10-CM

## 2018-07-24 DIAGNOSIS — K257 Chronic gastric ulcer without hemorrhage or perforation: Secondary | ICD-10-CM | POA: Diagnosis not present

## 2018-07-24 DIAGNOSIS — Z1211 Encounter for screening for malignant neoplasm of colon: Secondary | ICD-10-CM | POA: Diagnosis not present

## 2018-07-24 DIAGNOSIS — F411 Generalized anxiety disorder: Secondary | ICD-10-CM

## 2018-07-24 NOTE — Progress Notes (Signed)
[facility] [place], [place] [address]  Internal MEDICINE  Office Visit Note  Patient Name: Howard Weaver  [id number]  [id number]  Date of Service: 07/24/2018  Chief Complaint  Patient presents with  . Annual Exam  . Hypertension  . Anxiety     HPI Pt is here for routine health maintenance examination.  She is a well-appearing 62 year old Caucasian male.  He is currently being treated for hypertension, anxiety, peptic ulcer disease, and  Hyperlipidemia.  Patient denies any complaints at today's visit.  He reports that his hypertension and anxiety have been well controlled.  He denies tobacco, alcohol, or illicit drug use.  He is requesting a flu shot today however we do not currently have flu shot available for administration.  Current Medication: Outpatient Encounter Medications as of 07/24/2018  Medication Sig  . benazepril (LOTENSIN) 20 MG tablet Take 1 tablet (20 mg total) by mouth daily.  . busPIRone (BUSPAR) 7.5 MG tablet Take 1 tablet (7.5 mg total) by mouth 2 (two) times daily.  . cloNIDine (CATAPRES) 0.3 MG tablet Take 0.3 mg by mouth 2 (two) times daily.  . DULoxetine (CYMBALTA) 60 MG capsule Take 1 capsule (60 mg total) by mouth daily.  . hydrochlorothiazide (HYDRODIURIL) 25 MG tablet Take 25 mg by mouth daily.  . isosorbide mononitrate (IMDUR) 30 MG 24 hr tablet Take 30 mg by mouth daily.  . metoprolol succinate (TOPROL-XL) 100 MG 24 hr tablet Take by mouth.  . pantoprazole (PROTONIX) 40 MG tablet Take 1 tablet (40 mg total) by mouth 2 (two) times daily.  . rosuvastatin (CRESTOR) 20 MG tablet Take 1 tablet (20 mg total) by mouth daily.  Marland Kitchen tiZANidine (ZANAFLEX) 4 MG tablet Take 1 tablet (4 mg total) by mouth every 8 (eight) hours as needed for muscle spasms.  . traMADol (ULTRAM) 50 MG tablet Take 1 tablet (50 mg total) by mouth every 12 (twelve) hours as needed.   No facility-administered encounter medications on file as of 07/24/2018.      Surgical History: Past Surgical History:  Procedure Laterality Date  . BRAIN SURGERY    . CARDIAC CATHETERIZATION Right 11/25/2015   Procedure: Left Heart Cath and Coronary Angiography;  Surgeon: Dionisio David, MD;  Location: [facility];  Service: Cardiovascular;  Laterality: Right;    Medical History: Past Medical History:  Diagnosis Date  . CAD (coronary artery disease)   . Chest pain   . DU (duodenal ulcer)   . GAD (generalized anxiety disorder)   . Hypertension   . Myocardial infarction ([place])   . Radiculopathy of thoracolumbar region   . SVT (supraventricular tachycardia) ([place])   . Tachycardia     Family History: Family History  Problem Relation Age of Onset  . Hypertension Mother   . Diabetes Mother   . Depression Mother   . Hypertension Father       Review of Systems  Constitutional: Negative.  Negative for chills, fatigue and unexpected weight change.  HENT: Negative.  Negative for congestion, rhinorrhea, sneezing and sore throat.   Eyes: Negative for redness.  Respiratory: Negative.  Negative for cough, chest tightness and shortness of breath.   Cardiovascular: Negative.  Negative for chest pain and palpitations.  Gastrointestinal: Negative.  Negative for abdominal pain, constipation, diarrhea, nausea and vomiting.  Endocrine: Negative.   Genitourinary: Negative.  Negative for dysuria and frequency.  Musculoskeletal: Negative.  Negative for arthralgias, back pain, joint swelling and neck pain.  Skin: Negative.  Negative for rash.  Allergic/Immunologic: Negative.   Neurological: Negative.  Negative for tremors and numbness.  Hematological: Negative for adenopathy. Does not bruise/bleed easily.  Psychiatric/Behavioral: Negative.  Negative for behavioral problems, sleep disturbance and suicidal ideas. The patient is not nervous/anxious.      Vital Signs: BP 104/68 (BP Location: Right Arm, Patient Position: Sitting, Cuff Size: Large)   Pulse 77    Resp 16   Ht '5\' 9"'  (1.753 m)   Wt 208 lb (94.3 kg)   SpO2 97%   BMI 30.72 kg/m    Physical Exam  Constitutional: He is oriented to person, place, and time. He appears well-developed and well-nourished. No distress.  HENT:  Head: Normocephalic and atraumatic.  Mouth/Throat: Oropharynx is clear and moist. No oropharyngeal exudate.  Eyes: Pupils are equal, round, and reactive to light. EOM are normal.  Neck: Normal range of motion. Neck supple. No JVD present. No tracheal deviation present. No thyromegaly present.  Cardiovascular: Normal rate, regular rhythm and normal heart sounds. Exam reveals no gallop and no friction rub.  No murmur heard. Pulmonary/Chest: Effort normal and breath sounds normal. No respiratory distress. He has no wheezes. He has no rales. He exhibits no tenderness.  Abdominal: Soft. There is no tenderness. There is no guarding.  Musculoskeletal: Normal range of motion.  Lymphadenopathy:    He has no cervical adenopathy.  Neurological: He is alert and oriented to person, place, and time. No cranial nerve deficit.  Skin: Skin is warm and dry. He is not diaphoretic.  Psychiatric: He has a normal mood and affect. His behavior is normal. Judgment and thought content normal.  Nursing note and vitals reviewed.    LABS: Recent Results (from the past 2160 hour(s))  CBC     Status: None   Collection Time: 06/08/18  9:18 AM  Result Value Ref Range   WBC 6.2 4.0 - 10.5 K/uL   RBC 5.14 4.22 - 5.81 MIL/uL   Hemoglobin 15.0 13.0 - 17.0 g/dL   HCT 45.9 39.0 - 52.0 %   MCV 89.3 80.0 - 100.0 fL   MCH 29.2 26.0 - 34.0 pg   MCHC 32.7 30.0 - 36.0 g/dL   RDW 14.0 11.5 - 15.5 %   Platelets 212 150 - 400 K/uL   nRBC 0.0 0.0 - 0.2 %    Comment: Performed at [facility], [address] [place],  [address]  Comprehensive metabolic panel     Status: Abnormal   Collection Time: 06/08/18  9:18 AM  Result Value Ref Range   Sodium 142 135 - 145 mmol/L    Potassium 4.5 3.5 - 5.1 mmol/L   Chloride 103 98 - 111 mmol/L   CO2 30 22 - 32 mmol/L   Glucose, Bld 133 (H) 70 - 99 mg/dL   BUN 18 8 - 23 mg/dL   Creatinine, Ser 1.31 (H) 0.61 - 1.24 mg/dL   Calcium 9.8 8.9 - 10.3 mg/dL   Total Protein 7.7 6.5 - 8.1 g/dL   Albumin 3.9 3.5 - 5.0 g/dL   AST 17 15 - 41 U/L   ALT 13 0 - 44 U/L   Alkaline Phosphatase 51 38 - 126 U/L   Total Bilirubin 0.7 0.3 - 1.2 mg/dL   GFR calc non Af Amer 56 (L) >60 mL/min   GFR calc Af Amer >60 >60 mL/min    Comment: (NOTE) The eGFR has been calculated using the CKD EPI equation. This calculation has not been validated in all  clinical situations. eGFR's persistently <60 mL/min signify possible Chronic Kidney Disease.    Anion gap 9 5 - 15    Comment: Performed at [facility], [address] [place], [place] [address]  Lipid panel     Status: Abnormal   Collection Time: 06/08/18  9:18 AM  Result Value Ref Range   Cholesterol 128 0 - 200 mg/dL   Triglycerides 59 <150 mg/dL   HDL 37 (L) >40 mg/dL   Total CHOL/HDL Ratio 3.5 RATIO   VLDL 12 0 - 40 mg/dL   LDL Cholesterol 79 0 - 99 mg/dL    Comment:        Total Cholesterol/HDL:CHD Risk Coronary Heart Disease Risk Table                     Men   Women  1/2 Average Risk   3.4   3.3  Average Risk       5.0   4.4  2 X Average Risk   9.6   7.1  3 X Average Risk  23.4   11.0        Use the calculated Patient Ratio above and the CHD Risk Table to determine the patient's CHD Risk.        ATP III CLASSIFICATION (LDL):  <100     mg/dL   Optimal  100-129  mg/dL   Near or Above                    Optimal  130-159  mg/dL   Borderline  160-189  mg/dL   High  >190     mg/dL   Very High Performed at [facility], [address] [place], [place] [address]   TSH     Status: None   Collection Time: 06/08/18  9:18 AM  Result Value Ref Range   TSH 2.690 0.350 - 4.500 uIU/mL    Comment: Performed by a 3rd Generation assay with a functional  sensitivity of <=0.01 uIU/mL. Performed at [facility], [address] [place], [place] [address]   T4, free     Status: None   Collection Time: 06/08/18  9:18 AM  Result Value Ref Range   Free T4 1.01 0.82 - 1.77 ng/dL    Comment: (NOTE) Biotin ingestion may interfere with free T4 tests. If the results are inconsistent with the TSH level, previous test results, or the clinical presentation, then consider biotin interference. If needed, order repeat testing after stopping biotin. Performed at [facility], [address] [place], [place] [address]   PSA     Status: None   Collection Time: 06/08/18  9:18 AM  Result Value Ref Range   Prostatic Specific Antigen 0.92 0.00 - 4.00 ng/mL    Comment: (NOTE) While PSA levels of <=4.0 ng/ml are reported as reference range, some men with levels below 4.0 ng/ml can have prostate cancer and many men with PSA above 4.0 ng/ml do not have prostate cancer.  Other tests such as free PSA, age specific reference ranges, PSA velocity and PSA doubling time may be helpful especially in men less than 80 years old. Performed at [facility], [place] [address] [place], [place] [address]    Depression screen [facility] 2/9 07/24/2018 03/21/2018 11/08/2017  Decreased Interest 0 0 1  Down, Depressed, Hopeless 0 0 0  PHQ - 2 Score 0 0 1    Functional Status Survey: Is the patient deaf  or have difficulty hearing?: No Does the patient have difficulty seeing, even when wearing glasses/contacts?: No Does the patient have difficulty concentrating, remembering, or making decisions?: No Does the patient have difficulty walking or climbing stairs?: No Does the patient have difficulty dressing or bathing?: No Does the patient have difficulty doing errands alone such as visiting a doctor's office or shopping?: No  MMSE - [place] Exam 07/24/2018  Orientation to time 5  Orientation to Place 5  Registration 3  Attention/ Calculation  5  Recall 3  Language- name 2 objects 2  Language- repeat 1  Language- follow 3 step command 3  Language- read & follow direction 1  Write a sentence 1  Copy design 1  Total score 30    Fall Risk  07/24/2018 03/21/2018 11/08/2017  Falls in the past year? 0 No No  Number falls in past yr: 0 - -  Injury with Fall? 0 - -     Assessment/Plan: 1. Encounter for general adult medical examination with abnormal findings Patient is up-to-date on preventative health maintenance except for his colonoscopy.  Patient is opted for Cologuard and that we ordered at this visit.  Patient's lab work was drawn 1 month ago for different appointment and appears at baseline.  2. Essential hypertension Stable, continue current medication therapies.  3. Mixed hyperlipidemia Stable  4. GAD (generalized anxiety disorder) Stable, controlled with buspar.   5. Peptic ulcer of stomach, chronic Pt is on Protonix, continue current therapy.   6. Screen for colon cancer ColoGuard ordered.  General Counseling: Opie verbalizes understanding of the findings of todays visit and agrees with plan of treatment. I have discussed any further diagnostic evaluation that may be needed or ordered today. We also reviewed his medications today. he has been encouraged to call the office with any questions or concerns that should arise related to todays visit.   No orders of the defined types were placed in this encounter.   No orders of the defined types were placed in this encounter.   Time spent: 35 Minutes   This patient was seen by Orson Gear AGNP-C in Collaboration with Dr Lavera Guise as a part of collaborative care agreement    Kendell Bane AGNP-C Internal Medicine

## 2018-07-24 NOTE — Patient Instructions (Signed)

## 2018-07-26 ENCOUNTER — Telehealth: Payer: Self-pay

## 2018-07-26 NOTE — Telephone Encounter (Signed)
Faxed cologuard 

## 2018-10-11 ENCOUNTER — Other Ambulatory Visit: Payer: Self-pay

## 2018-10-11 DIAGNOSIS — F411 Generalized anxiety disorder: Secondary | ICD-10-CM

## 2018-10-11 MED ORDER — BUSPIRONE HCL 7.5 MG PO TABS: 7.5000 mg | ORAL_TABLET | Freq: Two times a day (BID) | ORAL | 3 refills | Status: DC

## 2018-10-16 ENCOUNTER — Other Ambulatory Visit: Payer: Self-pay

## 2018-10-16 DIAGNOSIS — K257 Chronic gastric ulcer without hemorrhage or perforation: Secondary | ICD-10-CM

## 2018-10-16 MED ORDER — PANTOPRAZOLE SODIUM 40 MG PO TBEC: 40.0000 mg | DELAYED_RELEASE_TABLET | Freq: Two times a day (BID) | ORAL | 3 refills | Status: DC

## 2018-12-28 ENCOUNTER — Other Ambulatory Visit: Payer: Self-pay | Admitting: Nurse Practitioner

## 2018-12-28 DIAGNOSIS — I10 Essential (primary) hypertension: Secondary | ICD-10-CM

## 2018-12-28 MED ORDER — BENAZEPRIL HCL 20 MG PO TABS: 20.0000 mg | ORAL_TABLET | Freq: Every day | ORAL | 5 refills | Status: DC

## 2019-06-11 ENCOUNTER — Other Ambulatory Visit: Payer: Self-pay

## 2019-06-11 DIAGNOSIS — K257 Chronic gastric ulcer without hemorrhage or perforation: Secondary | ICD-10-CM

## 2019-06-11 DIAGNOSIS — F411 Generalized anxiety disorder: Secondary | ICD-10-CM

## 2019-06-11 DIAGNOSIS — E782 Mixed hyperlipidemia: Secondary | ICD-10-CM

## 2019-06-11 MED ORDER — DULOXETINE HCL 60 MG PO CPEP: 60.0000 mg | ORAL_CAPSULE | Freq: Every day | ORAL | 1 refills | Status: DC

## 2019-06-11 MED ORDER — ROSUVASTATIN CALCIUM 20 MG PO TABS: 20.0000 mg | ORAL_TABLET | Freq: Every day | ORAL | 1 refills | Status: DC

## 2019-06-11 MED ORDER — BUSPIRONE HCL 7.5 MG PO TABS: 7.5000 mg | ORAL_TABLET | Freq: Two times a day (BID) | ORAL | 1 refills | Status: DC

## 2019-06-11 MED ORDER — PANTOPRAZOLE SODIUM 40 MG PO TBEC: 40.0000 mg | DELAYED_RELEASE_TABLET | Freq: Two times a day (BID) | ORAL | 1 refills | Status: DC

## 2019-07-30 ENCOUNTER — Ambulatory Visit: Payer: Self-pay | Admitting: Adult Health

## 2019-08-06 ENCOUNTER — Telehealth: Payer: Self-pay

## 2019-08-06 NOTE — Telephone Encounter (Signed)
Called lmom informing patient of appointment. klh 

## 2019-08-07 ENCOUNTER — Telehealth: Payer: Self-pay

## 2019-08-07 NOTE — Telephone Encounter (Signed)
CONFIRMED AND SCREENED PATIENT FOR 08-08-19 OV.

## 2019-08-08 ENCOUNTER — Other Ambulatory Visit: Payer: Self-pay

## 2019-08-08 ENCOUNTER — Ambulatory Visit (INDEPENDENT_AMBULATORY_CARE_PROVIDER_SITE_OTHER): Payer: Medicare Other | Admitting: Adult Health

## 2019-08-08 ENCOUNTER — Encounter: Payer: Self-pay | Admitting: Adult Health

## 2019-08-08 VITALS — BP 110/80 | HR 80 | Temp 96.7°F | Resp 16 | Ht 68.0 in | Wt 219.0 lb

## 2019-08-08 DIAGNOSIS — M5440 Lumbago with sciatica, unspecified side: Secondary | ICD-10-CM | POA: Diagnosis not present

## 2019-08-08 DIAGNOSIS — F411 Generalized anxiety disorder: Secondary | ICD-10-CM | POA: Diagnosis not present

## 2019-08-08 DIAGNOSIS — Z0001 Encounter for general adult medical examination with abnormal findings: Secondary | ICD-10-CM

## 2019-08-08 DIAGNOSIS — I10 Essential (primary) hypertension: Secondary | ICD-10-CM | POA: Diagnosis not present

## 2019-08-08 DIAGNOSIS — E782 Mixed hyperlipidemia: Secondary | ICD-10-CM

## 2019-08-08 DIAGNOSIS — Z125 Encounter for screening for malignant neoplasm of prostate: Secondary | ICD-10-CM

## 2019-08-08 NOTE — Progress Notes (Signed)
[facility] [address] [place], [place] [address]  Internal MEDICINE  Office Visit Note  Patient Name: Howard Weaver  [id number]  [id number]  Date of Service: 08/08/2019  Chief Complaint  Patient presents with  . Medicare Wellness  . Hypertension  . GI Problem    stomach gets upset a lot   . Back Pain  . Leg Pain     HPI Pt is here for routine health maintenance examination.  He is a well appearing 63 yo male.  He has a history of CKD, HTN, GERD and anxiety. Overall he is doing well. He does report "upset stomach" in the morning, that last about an hour.  He denies diarrhea, vomiting or nausea.  He can not describe this feeling further.  Typically, within an hour he feels normal, and he is able to go about his day.  He reports he has been eating Activa yogurt, when he feels bad, and this seems to help. He does have regular bowel movements.  He has some chronic back and leg pain that has gotten worse recently. He takes tramadol for this currently.     Current Medication: Outpatient Encounter Medications as of 08/08/2019  Medication Sig  . benazepril (LOTENSIN) 20 MG tablet Take 1 tablet (20 mg total) by mouth daily.  . busPIRone (BUSPAR) 7.5 MG tablet Take 1 tablet (7.5 mg total) by mouth 2 (two) times daily.  . cloNIDine (CATAPRES) 0.3 MG tablet Take 0.3 mg by mouth 2 (two) times daily.  . DULoxetine (CYMBALTA) 60 MG capsule Take 1 capsule (60 mg total) by mouth daily.  . hydrochlorothiazide (HYDRODIURIL) 25 MG tablet Take 25 mg by mouth daily.  . isosorbide mononitrate (IMDUR) 30 MG 24 hr tablet Take 30 mg by mouth daily.  . metoprolol succinate (TOPROL-XL) 100 MG 24 hr tablet Take by mouth.  . pantoprazole (PROTONIX) 40 MG tablet Take 1 tablet (40 mg total) by mouth 2 (two) times daily.  . rosuvastatin (CRESTOR) 20 MG tablet Take 1 tablet (20 mg total) by mouth daily.  Marland Kitchen tiZANidine (ZANAFLEX) 4 MG tablet Take 1 tablet (4 mg total) by mouth every 8 (eight)  hours as needed for muscle spasms.  . traMADol (ULTRAM) 50 MG tablet Take 1 tablet (50 mg total) by mouth every 12 (twelve) hours as needed.   No facility-administered encounter medications on file as of 08/08/2019.     Surgical History: Past Surgical History:  Procedure Laterality Date  . BRAIN SURGERY    . CARDIAC CATHETERIZATION Right 11/25/2015   Procedure: Left Heart Cath and Coronary Angiography;  Surgeon: Laurier Nancy, MD;  Location: [facility] IN[facility];  Service: Cardiovascular;  Laterality: Right;    Medical History: Past Medical History:  Diagnosis Date  . CAD (coronary artery disease)   . Chest pain   . DU (duodenal ulcer)   . GAD (generalized anxiety disorder)   . Hypertension   . Myocardial infarction ([facility])   . Radiculopathy of thoracolumbar region   . SVT (supraventricular tachycardia) ([facility])   . Tachycardia     Family History: Family History  Problem Relation Age of Onset  . Hypertension Mother   . Diabetes Mother   . Depression Mother   . Hypertension Father       Review of Systems  Constitutional: Negative.  Negative for chills, fatigue and unexpected weight change.  HENT: Negative.  Negative for congestion, rhinorrhea, sneezing and sore throat.   Eyes: Negative for redness.  Respiratory: Negative.  Negative for cough, chest tightness and shortness of breath.   Cardiovascular: Negative.  Negative for chest pain and palpitations.  Gastrointestinal: Negative.  Negative for abdominal pain, constipation, diarrhea, nausea and vomiting.  Endocrine: Negative.   Genitourinary: Negative.  Negative for dysuria and frequency.  Musculoskeletal: Negative.  Negative for arthralgias, back pain, joint swelling and neck pain.  Skin: Negative.  Negative for rash.  Allergic/Immunologic: Negative.   Neurological: Negative.  Negative for tremors and numbness.  Hematological: Negative for adenopathy. Does not bruise/bleed easily.  Psychiatric/Behavioral: Negative.   Negative for behavioral problems, sleep disturbance and suicidal ideas. The patient is not nervous/anxious.      Vital Signs: BP (!) 122/92   Pulse 80   Temp (!) 96.7 F (35.9 C)   Resp 16   Ht 5\' 8"  (1.727 m)   Wt 219 lb (99.3 kg)   SpO2 98%   BMI 33.30 kg/m    Physical Exam Vitals signs and nursing note reviewed.  Constitutional:      General: He is not in acute distress.    Appearance: He is well-developed. He is not diaphoretic.  HENT:     Head: Normocephalic and atraumatic.     Mouth/Throat:     Pharynx: No oropharyngeal exudate.  Eyes:     Pupils: Pupils are equal, round, and reactive to light.  Neck:     Musculoskeletal: Normal range of motion and neck supple.     Thyroid: No thyromegaly.     Vascular: No JVD.     Trachea: No tracheal deviation.  Cardiovascular:     Rate and Rhythm: Normal rate and regular rhythm.     Heart sounds: Normal heart sounds. No murmur. No friction rub. No gallop.   Pulmonary:     Effort: Pulmonary effort is normal. No respiratory distress.     Breath sounds: Normal breath sounds. No wheezing or rales.  Chest:     Chest wall: No tenderness.  Abdominal:     Palpations: Abdomen is soft.     Tenderness: There is no abdominal tenderness. There is no guarding.  Musculoskeletal: Normal range of motion.  Lymphadenopathy:     Cervical: No cervical adenopathy.  Skin:    General: Skin is warm and dry.  Neurological:     Mental Status: He is alert and oriented to person, place, and time.     Cranial Nerves: No cranial nerve deficit.  Psychiatric:        Behavior: Behavior normal.        Thought Content: Thought content normal.        Judgment: Judgment normal.     LABS: No results found for this or any previous visit (from the past 2160 hour(s)).   Assessment/Plan: 1. Encounter for general adult medical examination with abnormal findings Up to date on PHM. Will evaluate labs when results are available.  - CBC with  Differential/Platelet - Lipid Panel With LDL/HDL Ratio - TSH - T4, free - Comprehensive metabolic panel - UA/M w/rflx Culture, Comp  2. Essential hypertension Continue to monitor pressure.  Initially high, he reports some white coat htn, on recheck he was 110/80  3. Mixed hyperlipidemia Will get lipid panel to evaluate current levels. Continue Crestor as prescribed.   4. GAD (generalized anxiety disorder) Controlled using buspar and Cymbalta, continue as prescribed.   5. Low back pain with sciatica, sciatica laterality unspecified, unspecified back pain laterality, unspecified chronicity Discussed prednisone taper, but he would like to avoid that currently.  Continue tramadol as needed.  Discussed exercises and stretches he could use to alleviate pain.   6. Morbid obesity ([facility]) Obesity Counseling: Risk Assessment: An assessment of behavioral risk factors was made today and includes lack of exercise sedentary lifestyle, lack of portion control and poor dietary habits.  Risk Modification Advice: She was counseled on portion control guidelines. Restricting daily caloric intake to. . The detrimental long term effects of obesity on her health and ongoing poor compliance was also discussed with the patient.  7. Screening for prostate cancer - PSA  General Counseling: Kyran verbalizes understanding of the findings of todays visit and agrees with plan of treatment. I have discussed any further diagnostic evaluation that may be needed or ordered today. We also reviewed his medications today. he has been encouraged to call the office with any questions or concerns that should arise related to todays visit.   Orders Placed This Encounter  Procedures  . CBC with Differential/Platelet  . Lipid Panel With LDL/HDL Ratio  . TSH  . T4, free  . Comprehensive metabolic panel  . PSA    No orders of the defined types were placed in this encounter.   Time spent: 30 Minutes   This patient  was seen by Blima LedgerAdam Annajulia Lewing AGNP-C in Collaboration with Dr Lyndon CodeFozia M Khan as a part of collaborative care agreement    Johnna AcostaAdam J. Kazumi Lachney AGNP-C Internal Medicine

## 2019-08-15 LAB — LIPID PANEL WITH LDL/HDL RATIO
Cholesterol, Total: 169 mg/dL (ref 100–199)
HDL: 42 mg/dL (ref >39)
LDL Chol Calc (NIH): 102 mg/dL — ABNORMAL HIGH (ref 0–99)
LDL/HDL Ratio: 2.4 ratio (ref 0.0–3.6)
Triglycerides: 139 mg/dL (ref 0–149)
VLDL Cholesterol Cal: 25 mg/dL (ref 5–40)

## 2019-08-15 LAB — CBC WITH DIFFERENTIAL/PLATELET
Basophils Absolute: 0 10*3/uL (ref 0.0–0.2)
Basos: 1 %
EOS (ABSOLUTE): 0.2 10*3/uL (ref 0.0–0.4)
Eos: 3 %
Hematocrit: 46.9 % (ref 37.5–51.0)
Hemoglobin: 15.4 g/dL (ref 13.0–17.7)
Immature Grans (Abs): 0 10*3/uL (ref 0.0–0.1)
Immature Granulocytes: 0 %
Lymphocytes Absolute: 1.2 10*3/uL (ref 0.7–3.1)
Lymphs: 19 %
MCH: 29.3 pg (ref 26.6–33.0)
MCHC: 32.8 g/dL (ref 31.5–35.7)
MCV: 89 fL (ref 79–97)
Monocytes Absolute: 0.6 10*3/uL (ref 0.1–0.9)
Monocytes: 9 %
Neutrophils Absolute: 4.3 10*3/uL (ref 1.4–7.0)
Neutrophils: 68 %
Platelets: 216 10*3/uL (ref 150–450)
RBC: 5.26 x10E6/uL (ref 4.14–5.80)
RDW: 13.5 % (ref 11.6–15.4)
WBC: 6.4 10*3/uL (ref 3.4–10.8)

## 2019-08-15 LAB — MICROSCOPIC EXAMINATION
Bacteria, UA: NONE SEEN
Casts: NONE SEEN /lpf
RBC: NONE SEEN /hpf (ref 0–2)

## 2019-08-15 LAB — UA/M W/RFLX CULTURE, COMP
Bilirubin, UA: NEGATIVE
Glucose, UA: NEGATIVE
Ketones, UA: NEGATIVE
Leukocytes,UA: NEGATIVE
Nitrite, UA: NEGATIVE
Protein,UA: NEGATIVE
RBC, UA: NEGATIVE
Specific Gravity, UA: 1.021 (ref 1.005–1.030)
Urobilinogen, Ur: 1 mg/dL (ref 0.2–1.0)
pH, UA: 6.5 (ref 5.0–7.5)

## 2019-08-15 LAB — COMPREHENSIVE METABOLIC PANEL
ALT: 15 IU/L (ref 0–44)
AST: 19 IU/L (ref 0–40)
Albumin/Globulin Ratio: 1.4 (ref 1.2–2.2)
Albumin: 4.4 g/dL (ref 3.8–4.8)
Alkaline Phosphatase: 72 IU/L (ref 39–117)
BUN/Creatinine Ratio: 11 (ref 10–24)
BUN: 15 mg/dL (ref 8–27)
Bilirubin Total: 0.5 mg/dL (ref 0.0–1.2)
CO2: 26 mmol/L (ref 20–29)
Calcium: 9.9 mg/dL (ref 8.6–10.2)
Chloride: 98 mmol/L (ref 96–106)
Creatinine, Ser: 1.32 mg/dL — ABNORMAL HIGH (ref 0.76–1.27)
GFR calc Af Amer: 65 mL/min/{1.73_m2} (ref >59)
GFR calc non Af Amer: 57 mL/min/{1.73_m2} — ABNORMAL LOW (ref >59)
Globulin, Total: 3.2 g/dL (ref 1.5–4.5)
Glucose: 107 mg/dL — ABNORMAL HIGH (ref 65–99)
Potassium: 4.7 mmol/L (ref 3.5–5.2)
Sodium: 138 mmol/L (ref 134–144)
Total Protein: 7.6 g/dL (ref 6.0–8.5)

## 2019-08-15 LAB — PSA: Prostate Specific Ag, Serum: 0.8 ng/mL (ref 0.0–4.0)

## 2019-08-15 LAB — T4, FREE: Free T4: 1.18 ng/dL (ref 0.82–1.77)

## 2019-08-15 LAB — TSH: TSH: 2.72 u[IU]/mL (ref 0.450–4.500)

## 2019-08-20 ENCOUNTER — Telehealth: Payer: Self-pay

## 2019-08-20 NOTE — Telephone Encounter (Signed)
Confirmed appointment with patient. klh °

## 2019-08-22 ENCOUNTER — Ambulatory Visit (INDEPENDENT_AMBULATORY_CARE_PROVIDER_SITE_OTHER): Payer: Medicare Other | Admitting: Adult Health

## 2019-08-22 ENCOUNTER — Encounter: Payer: Self-pay | Admitting: Adult Health

## 2019-08-22 ENCOUNTER — Other Ambulatory Visit: Payer: Self-pay

## 2019-08-22 VITALS — BP 190/120 | HR 77 | Temp 97.6°F | Resp 16 | Ht 68.0 in | Wt 221.0 lb

## 2019-08-22 DIAGNOSIS — F411 Generalized anxiety disorder: Secondary | ICD-10-CM | POA: Diagnosis not present

## 2019-08-22 DIAGNOSIS — K257 Chronic gastric ulcer without hemorrhage or perforation: Secondary | ICD-10-CM | POA: Diagnosis not present

## 2019-08-22 DIAGNOSIS — I10 Essential (primary) hypertension: Secondary | ICD-10-CM

## 2019-08-22 DIAGNOSIS — N1831 Chronic kidney disease, stage 3a: Secondary | ICD-10-CM | POA: Diagnosis not present

## 2019-08-22 DIAGNOSIS — R7309 Other abnormal glucose: Secondary | ICD-10-CM | POA: Diagnosis not present

## 2019-08-22 LAB — POCT GLYCOSYLATED HEMOGLOBIN (HGB A1C): Hemoglobin A1C: 5.9 % — AB (ref 4.0–5.6)

## 2019-08-22 NOTE — Progress Notes (Signed)
[facility] [address] [place], [place] [address]  Internal MEDICINE  Office Visit Note  Patient Name: Howard Weaver  [id number]  [id number]  Date of Service: 08/22/2019  Chief Complaint  Patient presents with  . Medical Management of Chronic Issues    2 week follow up review labs   . Hypertension    bp is high patient has not taken his medication     HPI  Pt is here for follow up to review labs.  His labs overall are stable.  He does have an elevated creatine, but even that appears stable.  He saw Dr. Thedore Mins for nephrology in the past, but has not seen him for a few years.  He would like to reestablish care. Pts blood pressure is very high today.  He did not take any of his bp medication today because he doesn't like to take it when he leaves home. He has a history of a brain aneurism.    Current Medication: Outpatient Encounter Medications as of 08/22/2019  Medication Sig  . benazepril (LOTENSIN) 20 MG tablet Take 1 tablet (20 mg total) by mouth daily.  . busPIRone (BUSPAR) 7.5 MG tablet Take 1 tablet (7.5 mg total) by mouth 2 (two) times daily.  . cloNIDine (CATAPRES) 0.3 MG tablet Take 0.3 mg by mouth 2 (two) times daily.  . DULoxetine (CYMBALTA) 60 MG capsule Take 1 capsule (60 mg total) by mouth daily.  . hydrochlorothiazide (HYDRODIURIL) 25 MG tablet Take 25 mg by mouth daily.  . isosorbide mononitrate (IMDUR) 30 MG 24 hr tablet Take 30 mg by mouth daily.  . metoprolol succinate (TOPROL-XL) 100 MG 24 hr tablet Take by mouth.  . pantoprazole (PROTONIX) 40 MG tablet Take 1 tablet (40 mg total) by mouth 2 (two) times daily.  . rosuvastatin (CRESTOR) 20 MG tablet Take 1 tablet (20 mg total) by mouth daily.  Marland Kitchen tiZANidine (ZANAFLEX) 4 MG tablet Take 1 tablet (4 mg total) by mouth every 8 (eight) hours as needed for muscle spasms.  . traMADol (ULTRAM) 50 MG tablet Take 1 tablet (50 mg total) by mouth every 12 (twelve) hours as needed.   No facility-administered  encounter medications on file as of 08/22/2019.    Surgical History: Past Surgical History:  Procedure Laterality Date  . BRAIN SURGERY    . CARDIAC CATHETERIZATION Right 11/25/2015   Procedure: Left Heart Cath and Coronary Angiography;  Surgeon: Laurier Nancy, MD;  Location: [facility] INVASIVE CV LAB;  Service: Cardiovascular;  Laterality: Right;    Medical History: Past Medical History:  Diagnosis Date  . CAD (coronary artery disease)   . Chest pain   . DU (duodenal ulcer)   . GAD (generalized anxiety disorder)   . Hypertension   . Myocardial infarction (HCC)   . Radiculopathy of thoracolumbar region   . SVT (supraventricular tachycardia) (HCC)   . Tachycardia     Family History: Family History  Problem Relation Age of Onset  . Hypertension Mother   . Diabetes Mother   . Depression Mother   . Hypertension Father     Social History   Socioeconomic History  . Marital status: Married    Spouse name: Not on file  . Number of children: Not on file  . Years of education: Not on file  . Highest education level: Not on file  Occupational History  . Not on file  Tobacco Use  . Smoking status: Former Smoker    Quit date: 11/25/1975  Years since quitting: 43.7  . Smokeless tobacco: Never Used  Substance and Sexual Activity  . Alcohol use: No  . Drug use: No  . Sexual activity: Not on file  Other Topics Concern  . Not on file  Social History Narrative  . Not on file   Social Determinants of Health   Financial Resource Strain:   . Difficulty of Paying Living Expenses: Not on file  Food Insecurity:   . Worried About Programme researcher, broadcasting/film/videounning Out of Food in the Last Year: Not on file  . Ran Out of Food in the Last Year: Not on file  Transportation Needs:   . Lack of Transportation (Medical): Not on file  . Lack of Transportation (Non-Medical): Not on file  Physical Activity:   . Days of Exercise per Week: Not on file  . Minutes of Exercise per Session: Not on file  Stress:   .  Feeling of Stress : Not on file  Social Connections:   . Frequency of Communication with Friends and Family: Not on file  . Frequency of Social Gatherings with Friends and Family: Not on file  . Attends Religious Services: Not on file  . Active Member of Clubs or Organizations: Not on file  . Attends BankerClub or Organization Meetings: Not on file  . Marital Status: Not on file  Intimate Partner Violence:   . Fear of Current or Ex-Partner: Not on file  . Emotionally Abused: Not on file  . Physically Abused: Not on file  . Sexually Abused: Not on file      Review of Systems  Constitutional: Negative.  Negative for chills, fatigue and unexpected weight change.  HENT: Negative.  Negative for congestion, rhinorrhea, sneezing and sore throat.   Eyes: Negative for redness.  Respiratory: Negative.  Negative for cough, chest tightness and shortness of breath.   Cardiovascular: Negative.  Negative for chest pain and palpitations.  Gastrointestinal: Negative.  Negative for abdominal pain, constipation, diarrhea, nausea and vomiting.  Endocrine: Negative.   Genitourinary: Negative.  Negative for dysuria and frequency.  Musculoskeletal: Negative.  Negative for arthralgias, back pain, joint swelling and neck pain.  Skin: Negative.  Negative for rash.  Allergic/Immunologic: Negative.   Neurological: Negative.  Negative for tremors and numbness.  Hematological: Negative for adenopathy. Does not bruise/bleed easily.  Psychiatric/Behavioral: Negative.  Negative for behavioral problems, sleep disturbance and suicidal ideas. The patient is not nervous/anxious.     Vital Signs: BP (!) 210/138   Pulse 77   Temp 97.6 F (36.4 C)   Resp 16   Ht 5\' 8"  (1.727 m)   Wt 221 lb (100.2 kg)   SpO2 98%   BMI 33.60 kg/m    Physical Exam Vitals and nursing note reviewed.  Constitutional:      General: He is not in acute distress.    Appearance: He is well-developed. He is not diaphoretic.  HENT:      Head: Normocephalic and atraumatic.     Mouth/Throat:     Pharynx: No oropharyngeal exudate.  Eyes:     Pupils: Pupils are equal, round, and reactive to light.  Neck:     Thyroid: No thyromegaly.     Vascular: No JVD.     Trachea: No tracheal deviation.  Cardiovascular:     Rate and Rhythm: Normal rate and regular rhythm.     Heart sounds: Normal heart sounds. No murmur. No friction rub. No gallop.   Pulmonary:     Effort: Pulmonary effort is normal.  No respiratory distress.     Breath sounds: Normal breath sounds. No wheezing or rales.  Chest:     Chest wall: No tenderness.  Abdominal:     Palpations: Abdomen is soft.     Tenderness: There is no abdominal tenderness. There is no guarding.  Musculoskeletal:        General: Normal range of motion.     Cervical back: Normal range of motion and neck supple.  Lymphadenopathy:     Cervical: No cervical adenopathy.  Skin:    General: Skin is warm and dry.  Neurological:     Mental Status: He is alert and oriented to person, place, and time.     Cranial Nerves: No cranial nerve deficit.  Psychiatric:        Behavior: Behavior normal.        Thought Content: Thought content normal.        Judgment: Judgment normal.    Assessment/Plan: 1. Essential hypertension Pts bp is elevated today, discussed the importance of medication compliance and the dangers of uncontrolled bp.  Instructed patient to go home and take medication, if bp does not improve in 1 hour, he should go to the Er.   2. Stage 3a chronic kidney disease Reestablish care with Dr. Candiss Norse, and follow.   - Ambulatory referral to Nephrology  3. GAD (generalized anxiety disorder) Controlled, continue present management.  4. Peptic ulcer of stomach, chronic Continue Protonix as ordered.   5. Morbid obesity ([place]) Obesity Counseling: Risk Assessment: An assessment of behavioral risk factors was made today and includes lack of exercise sedentary lifestyle, lack of portion  control and poor dietary habits.  Risk Modification Advice: She was counseled on portion control guidelines. Restricting daily caloric intake to 1800. The detrimental long term effects of obesity on her health and ongoing poor compliance was also discussed with the patient.    General Counseling: Tarquin verbalizes understanding of the findings of todays visit and agrees with plan of treatment. I have discussed any further diagnostic evaluation that may be needed or ordered today. We also reviewed his medications today. he has been encouraged to call the office with any questions or concerns that should arise related to todays visit.  Hypertension Counseling:   The following hypertensive lifestyle modification were recommended and discussed:  1. Limiting alcohol intake to less than 1 oz/day of ethanol:(24 oz of beer or 8 oz of wine or 2 oz of 100-proof whiskey). 2. Take baby ASA 81 mg daily. 3. Importance of regular aerobic exercise and losing weight. 4. Reduce dietary saturated fat and cholesterol intake for overall cardiovascular health. 5. Maintaining adequate dietary potassium, calcium, and magnesium intake. 6. Regular monitoring of the blood pressure. 7. Reduce sodium intake to less than 100 mmol/day (less than 2.3 gm of sodium or less than 6 gm of sodium choride)   Orders Placed This Encounter  Procedures  . Ambulatory referral to Nephrology    No orders of the defined types were placed in this encounter.   Time spent: 25 Minutes   This patient was seen by Orson Gear AGNP-C in Collaboration with Dr Lavera Guise as a part of collaborative care agreement     Kendell Bane AGNP-C Internal medicine

## 2019-08-28 ENCOUNTER — Telehealth: Payer: Self-pay

## 2019-08-28 NOTE — Telephone Encounter (Signed)
Confirmed appointment with patient. klh °

## 2019-08-30 ENCOUNTER — Encounter: Payer: Self-pay | Admitting: Adult Health

## 2019-08-30 ENCOUNTER — Ambulatory Visit (INDEPENDENT_AMBULATORY_CARE_PROVIDER_SITE_OTHER): Payer: Medicare Other | Admitting: Adult Health

## 2019-08-30 ENCOUNTER — Other Ambulatory Visit: Payer: Self-pay

## 2019-08-30 VITALS — BP 125/85 | HR 59 | Temp 97.0°F | Resp 16 | Ht 68.0 in | Wt 219.0 lb

## 2019-08-30 DIAGNOSIS — F411 Generalized anxiety disorder: Secondary | ICD-10-CM

## 2019-08-30 DIAGNOSIS — I10 Essential (primary) hypertension: Secondary | ICD-10-CM | POA: Diagnosis not present

## 2019-08-30 MED ORDER — CLONIDINE HCL 0.3 MG PO TABS: 0.3000 mg | ORAL_TABLET | Freq: Two times a day (BID) | ORAL | 1 refills | Status: DC

## 2019-08-30 MED ORDER — METOPROLOL SUCCINATE ER 100 MG PO TB24: 100.0000 mg | ORAL_TABLET | Freq: Every day | ORAL | 2 refills | Status: DC

## 2019-08-30 MED ORDER — HYDROCHLOROTHIAZIDE 25 MG PO TABS: 25.0000 mg | ORAL_TABLET | Freq: Every day | ORAL | 1 refills | Status: DC

## 2019-08-30 NOTE — Progress Notes (Signed)
[facility] [address] [place], [place] [address]  Internal MEDICINE  Office Visit Note  Patient Name: Howard Weaver  [id number]  [id number]  Date of Service: 08/30/2019  Chief Complaint  Patient presents with  . Hypertension  . Anxiety    HPI  Pt is here for one week follow up on HTN and anxiety. Last visit he had not taken his blood pressure medications, and it was very high.  Today he took his medications and BP is 125/85. He Denies Chest pain, Shortness of breath, palpitations, headache, or blurred vision.      Current Medication: Outpatient Encounter Medications as of 08/30/2019  Medication Sig  . benazepril (LOTENSIN) 20 MG tablet Take 1 tablet (20 mg total) by mouth daily.  . busPIRone (BUSPAR) 7.5 MG tablet Take 1 tablet (7.5 mg total) by mouth 2 (two) times daily.  . cloNIDine (CATAPRES) 0.3 MG tablet Take 1 tablet (0.3 mg total) by mouth 2 (two) times daily.  . DULoxetine (CYMBALTA) 60 MG capsule Take 1 capsule (60 mg total) by mouth daily.  . hydrochlorothiazide (HYDRODIURIL) 25 MG tablet Take 1 tablet (25 mg total) by mouth daily.  . isosorbide mononitrate (IMDUR) 30 MG 24 hr tablet Take 30 mg by mouth daily.  . metoprolol succinate (TOPROL-XL) 100 MG 24 hr tablet Take 1 tablet (100 mg total) by mouth daily.  . pantoprazole (PROTONIX) 40 MG tablet Take 1 tablet (40 mg total) by mouth 2 (two) times daily.  . rosuvastatin (CRESTOR) 20 MG tablet Take 1 tablet (20 mg total) by mouth daily.  Marland Kitchen tiZANidine (ZANAFLEX) 4 MG tablet Take 1 tablet (4 mg total) by mouth every 8 (eight) hours as needed for muscle spasms.  . traMADol (ULTRAM) 50 MG tablet Take 1 tablet (50 mg total) by mouth every 12 (twelve) hours as needed.  . [DISCONTINUED] cloNIDine (CATAPRES) 0.3 MG tablet Take 0.3 mg by mouth 2 (two) times daily.  . [DISCONTINUED] cloNIDine (CATAPRES) 0.3 MG tablet Take 1 tablet (0.3 mg total) by mouth 2 (two) times daily.  . [DISCONTINUED]  hydrochlorothiazide (HYDRODIURIL) 25 MG tablet Take 25 mg by mouth daily.  . [DISCONTINUED] hydrochlorothiazide (HYDRODIURIL) 25 MG tablet Take 1 tablet (25 mg total) by mouth daily.  . [DISCONTINUED] metoprolol succinate (TOPROL-XL) 100 MG 24 hr tablet Take by mouth.  . [DISCONTINUED] metoprolol succinate (TOPROL-XL) 100 MG 24 hr tablet Take 1 tablet (100 mg total) by mouth daily.   No facility-administered encounter medications on file as of 08/30/2019.    Surgical History: Past Surgical History:  Procedure Laterality Date  . BRAIN SURGERY    . CARDIAC CATHETERIZATION Right 11/25/2015   Procedure: Left Heart Cath and Coronary Angiography;  Surgeon: Laurier Nancy, MD;  Location: [facility] INVASIVE CV LAB;  Service: Cardiovascular;  Laterality: Right;    Medical History: Past Medical History:  Diagnosis Date  . CAD (coronary artery disease)   . Chest pain   . DU (duodenal ulcer)   . GAD (generalized anxiety disorder)   . Hypertension   . Myocardial infarction (HCC)   . Radiculopathy of thoracolumbar region   . SVT (supraventricular tachycardia) (HCC)   . Tachycardia     Family History: Family History  Problem Relation Age of Onset  . Hypertension Mother   . Diabetes Mother   . Depression Mother   . Hypertension Father     Social History   Socioeconomic History  . Marital status: Married    Spouse name: Not on file  .  Number of children: Not on file  . Years of education: Not on file  . Highest education level: Not on file  Occupational History  . Not on file  Tobacco Use  . Smoking status: Former Smoker    Quit date: 11/25/1975    Years since quitting: 43.7  . Smokeless tobacco: Never Used  Substance and Sexual Activity  . Alcohol use: No  . Drug use: No  . Sexual activity: Not on file  Other Topics Concern  . Not on file  Social History Narrative  . Not on file   Social Determinants of Health   Financial Resource Strain:   . Difficulty of Paying Living  Expenses: Not on file  Food Insecurity:   . Worried About Programme researcher, broadcasting/film/videounning Out of Food in the Last Year: Not on file  . Ran Out of Food in the Last Year: Not on file  Transportation Needs:   . Lack of Transportation (Medical): Not on file  . Lack of Transportation (Non-Medical): Not on file  Physical Activity:   . Days of Exercise per Week: Not on file  . Minutes of Exercise per Session: Not on file  Stress:   . Feeling of Stress : Not on file  Social Connections:   . Frequency of Communication with Friends and Family: Not on file  . Frequency of Social Gatherings with Friends and Family: Not on file  . Attends Religious Services: Not on file  . Active Member of Clubs or Organizations: Not on file  . Attends BankerClub or Organization Meetings: Not on file  . Marital Status: Not on file  Intimate Partner Violence:   . Fear of Current or Ex-Partner: Not on file  . Emotionally Abused: Not on file  . Physically Abused: Not on file  . Sexually Abused: Not on file      Review of Systems  Constitutional: Negative.  Negative for chills, fatigue and unexpected weight change.  HENT: Negative.  Negative for congestion, rhinorrhea, sneezing and sore throat.   Eyes: Negative for redness.  Respiratory: Negative.  Negative for cough, chest tightness and shortness of breath.   Cardiovascular: Negative.  Negative for chest pain and palpitations.  Gastrointestinal: Negative.  Negative for abdominal pain, constipation, diarrhea, nausea and vomiting.  Endocrine: Negative.   Genitourinary: Negative.  Negative for dysuria and frequency.  Musculoskeletal: Negative.  Negative for arthralgias, back pain, joint swelling and neck pain.  Skin: Negative.  Negative for rash.  Allergic/Immunologic: Negative.   Neurological: Negative.  Negative for tremors and numbness.  Hematological: Negative for adenopathy. Does not bruise/bleed easily.  Psychiatric/Behavioral: Negative.  Negative for behavioral problems, sleep  disturbance and suicidal ideas. The patient is not nervous/anxious.     Vital Signs: BP 125/85   Pulse (!) 59   Temp (!) 97 F (36.1 C)   Resp 16   Ht 5\' 8"  (1.727 m)   Wt 219 lb (99.3 kg)   SpO2 98%   BMI 33.30 kg/m    Physical Exam Vitals and nursing note reviewed.  Constitutional:      General: He is not in acute distress.    Appearance: He is well-developed. He is not diaphoretic.  HENT:     Head: Normocephalic and atraumatic.     Mouth/Throat:     Pharynx: No oropharyngeal exudate.  Eyes:     Pupils: Pupils are equal, round, and reactive to light.  Neck:     Thyroid: No thyromegaly.     Vascular: No JVD.  Trachea: No tracheal deviation.  Cardiovascular:     Rate and Rhythm: Normal rate and regular rhythm.     Heart sounds: Normal heart sounds. No murmur. No friction rub. No gallop.   Pulmonary:     Effort: Pulmonary effort is normal. No respiratory distress.     Breath sounds: Normal breath sounds. No wheezing or rales.  Chest:     Chest wall: No tenderness.  Abdominal:     Palpations: Abdomen is soft.     Tenderness: There is no abdominal tenderness. There is no guarding.  Musculoskeletal:        General: Normal range of motion.     Cervical back: Normal range of motion and neck supple.  Lymphadenopathy:     Cervical: No cervical adenopathy.  Skin:    General: Skin is warm and dry.  Neurological:     Mental Status: He is alert and oriented to person, place, and time.     Cranial Nerves: No cranial nerve deficit.  Psychiatric:        Behavior: Behavior normal.        Thought Content: Thought content normal.        Judgment: Judgment normal.    Assessment/Plan: 1. Essential hypertension Controlled at this time.  Encouraged medication compliance. Will continue to follow up.  - metoprolol succinate (TOPROL-XL) 100 MG 24 hr tablet; Take 1 tablet (100 mg total) by mouth daily.  Dispense: 90 tablet; Refill: 2 - hydrochlorothiazide (HYDRODIURIL) 25 MG  tablet; Take 1 tablet (25 mg total) by mouth daily.  Dispense: 90 tablet; Refill: 1 - cloNIDine (CATAPRES) 0.3 MG tablet; Take 1 tablet (0.3 mg total) by mouth 2 (two) times daily.  Dispense: 180 tablet; Refill: 1  2. GAD (generalized anxiety disorder) Controlled, continue present management.   General Counseling: Yandriel verbalizes understanding of the findings of todays visit and agrees with plan of treatment. I have discussed any further diagnostic evaluation that may be needed or ordered today. We also reviewed his medications today. he has been encouraged to call the office with any questions or concerns that should arise related to todays visit.    No orders of the defined types were placed in this encounter.   Meds ordered this encounter  Medications  . DISCONTD: metoprolol succinate (TOPROL-XL) 100 MG 24 hr tablet    Sig: Take 1 tablet (100 mg total) by mouth daily.    Dispense:  90 tablet    Refill:  2  . DISCONTD: hydrochlorothiazide (HYDRODIURIL) 25 MG tablet    Sig: Take 1 tablet (25 mg total) by mouth daily.    Dispense:  90 tablet    Refill:  1  . DISCONTD: cloNIDine (CATAPRES) 0.3 MG tablet    Sig: Take 1 tablet (0.3 mg total) by mouth 2 (two) times daily.    Dispense:  180 tablet    Refill:  1  . cloNIDine (CATAPRES) 0.3 MG tablet    Sig: Take 1 tablet (0.3 mg total) by mouth 2 (two) times daily.    Dispense:  180 tablet    Refill:  1  . metoprolol succinate (TOPROL-XL) 100 MG 24 hr tablet    Sig: Take 1 tablet (100 mg total) by mouth daily.    Dispense:  90 tablet    Refill:  2  . hydrochlorothiazide (HYDRODIURIL) 25 MG tablet    Sig: Take 1 tablet (25 mg total) by mouth daily.    Dispense:  90 tablet    Refill:  1    Time spent: 25 Minutes   This patient was seen by Orson Gear AGNP-C in Collaboration with Dr Lavera Guise as a part of collaborative care agreement     Kendell Bane AGNP-C Internal medicine

## 2019-09-24 ENCOUNTER — Telehealth: Payer: Self-pay

## 2019-09-24 NOTE — Telephone Encounter (Signed)
COMPLETED RECORD REQUEST FROM [employer] FAXED PMT SHEET AND PLACED RECORDS IN MAIL.

## 2019-10-02 ENCOUNTER — Other Ambulatory Visit: Payer: Self-pay

## 2019-10-02 DIAGNOSIS — F411 Generalized anxiety disorder: Secondary | ICD-10-CM

## 2019-10-02 MED ORDER — BUSPIRONE HCL 7.5 MG PO TABS: 7.5000 mg | ORAL_TABLET | Freq: Two times a day (BID) | ORAL | 0 refills | Status: DC

## 2019-10-08 ENCOUNTER — Telehealth: Payer: Self-pay

## 2019-10-08 NOTE — Telephone Encounter (Signed)
Patient cancelled appointment on 10/10/2019 will call back to reschedule. klh

## 2019-10-10 ENCOUNTER — Ambulatory Visit: Payer: Medicare Other | Admitting: Adult Health

## 2019-11-05 ENCOUNTER — Other Ambulatory Visit: Payer: Self-pay | Admitting: Adult Health

## 2019-11-05 DIAGNOSIS — E782 Mixed hyperlipidemia: Secondary | ICD-10-CM

## 2019-11-05 DIAGNOSIS — F411 Generalized anxiety disorder: Secondary | ICD-10-CM

## 2019-11-05 DIAGNOSIS — K257 Chronic gastric ulcer without hemorrhage or perforation: Secondary | ICD-10-CM

## 2019-11-18 ENCOUNTER — Ambulatory Visit: Payer: PPO | Attending: Internal Medicine

## 2019-11-18 DIAGNOSIS — Z23 Encounter for immunization: Secondary | ICD-10-CM

## 2019-11-18 NOTE — Progress Notes (Signed)
   Covid-19 Vaccination Clinic  Name:  Howard Weaver    MRN: [id number] DOB: 09/13/1955  11/18/2019  [name] was observed post Covid-19 immunization for 15 minutes without incident. He was provided with Vaccine Information Sheet and instruction to access the V-Safe system.   [name] was instructed to call 911 with any severe reactions post vaccine: Marland Kitchen Difficulty breathing  . Swelling of face and throat  . A fast heartbeat  . A bad rash all over body  . Dizziness and weakness   Immunizations Administered    Name Date Dose VIS Date Route   Pfizer COVID-19 Vaccine 11/18/2019 10:41 AM 0.3 mL 08/10/2019 Intramuscular   Manufacturer: [employer], [employer]   Lot: [id number]   NDC: [address]-9

## 2019-12-09 ENCOUNTER — Ambulatory Visit: Payer: PPO | Attending: Internal Medicine

## 2019-12-09 DIAGNOSIS — Z23 Encounter for immunization: Secondary | ICD-10-CM

## 2019-12-09 NOTE — Progress Notes (Signed)
   Covid-19 Vaccination Clinic  Name:  Howard Weaver    MRN: [id number] DOB: 08-31-1955  12/09/2019  [name] was observed post Covid-19 immunization for 15 minutes without incident. He was provided with Vaccine Information Sheet and instruction to access the V-Safe system.   [name] was instructed to call 911 with any severe reactions post vaccine: Marland Kitchen Difficulty breathing  . Swelling of face and throat  . A fast heartbeat  . A bad rash all over body  . Dizziness and weakness   Immunizations Administered    Name Date Dose VIS Date Route   Pfizer COVID-19 Vaccine 12/09/2019  9:16 AM 0.3 mL 08/10/2019 Intramuscular   Manufacturer: [employer], [employer]   Lot: [id number]   NDC: [address]-6

## 2019-12-11 ENCOUNTER — Other Ambulatory Visit: Payer: Self-pay | Admitting: Adult Health

## 2019-12-11 DIAGNOSIS — I10 Essential (primary) hypertension: Secondary | ICD-10-CM

## 2020-01-15 ENCOUNTER — Inpatient Hospital Stay: Payer: Medicare Other

## 2020-01-15 ENCOUNTER — Other Ambulatory Visit: Payer: Self-pay

## 2020-01-15 ENCOUNTER — Observation Stay: Payer: Medicare Other

## 2020-01-15 ENCOUNTER — Emergency Department: Payer: Medicare Other

## 2020-01-15 ENCOUNTER — Inpatient Hospital Stay
Admission: EM | Admit: 2020-01-15 | Discharge: 2020-01-17 | DRG: 065 | Disposition: A | Discharge status: Home / Self Care | Payer: Medicare Other | Attending: Internal Medicine | Admitting: Internal Medicine

## 2020-01-15 DIAGNOSIS — R29700 NIHSS score 0: Secondary | ICD-10-CM | POA: Diagnosis present

## 2020-01-15 DIAGNOSIS — Z8249 Family history of ischemic heart disease and other diseases of the circulatory system: Secondary | ICD-10-CM | POA: Diagnosis not present

## 2020-01-15 DIAGNOSIS — Z9114 Patient's other noncompliance with medication regimen: Secondary | ICD-10-CM

## 2020-01-15 DIAGNOSIS — E785 Hyperlipidemia, unspecified: Secondary | ICD-10-CM | POA: Diagnosis present

## 2020-01-15 DIAGNOSIS — R778 Other specified abnormalities of plasma proteins: Secondary | ICD-10-CM | POA: Diagnosis present

## 2020-01-15 DIAGNOSIS — Z79899 Other long term (current) drug therapy: Secondary | ICD-10-CM | POA: Diagnosis not present

## 2020-01-15 DIAGNOSIS — Z818 Family history of other mental and behavioral disorders: Secondary | ICD-10-CM | POA: Diagnosis not present

## 2020-01-15 DIAGNOSIS — R42 Dizziness and giddiness: Secondary | ICD-10-CM | POA: Diagnosis present

## 2020-01-15 DIAGNOSIS — Z66 Do not resuscitate: Secondary | ICD-10-CM | POA: Diagnosis present

## 2020-01-15 DIAGNOSIS — R7303 Prediabetes: Secondary | ICD-10-CM | POA: Diagnosis present

## 2020-01-15 DIAGNOSIS — Z8711 Personal history of peptic ulcer disease: Secondary | ICD-10-CM | POA: Diagnosis not present

## 2020-01-15 DIAGNOSIS — I2583 Coronary atherosclerosis due to lipid rich plaque: Secondary | ICD-10-CM | POA: Diagnosis present

## 2020-01-15 DIAGNOSIS — I6389 Other cerebral infarction: Secondary | ICD-10-CM

## 2020-01-15 DIAGNOSIS — I2510 Atherosclerotic heart disease of native coronary artery without angina pectoris: Secondary | ICD-10-CM | POA: Diagnosis present

## 2020-01-15 DIAGNOSIS — Z833 Family history of diabetes mellitus: Secondary | ICD-10-CM | POA: Diagnosis not present

## 2020-01-15 DIAGNOSIS — I639 Cerebral infarction, unspecified: Secondary | ICD-10-CM | POA: Diagnosis not present

## 2020-01-15 DIAGNOSIS — Z20822 Contact with and (suspected) exposure to covid-19: Secondary | ICD-10-CM | POA: Diagnosis present

## 2020-01-15 DIAGNOSIS — I82403 Acute embolism and thrombosis of unspecified deep veins of lower extremity, bilateral: Secondary | ICD-10-CM | POA: Diagnosis present

## 2020-01-15 DIAGNOSIS — R9431 Abnormal electrocardiogram [ECG] [EKG]: Secondary | ICD-10-CM

## 2020-01-15 DIAGNOSIS — Z87891 Personal history of nicotine dependence: Secondary | ICD-10-CM

## 2020-01-15 DIAGNOSIS — I129 Hypertensive chronic kidney disease with stage 1 through stage 4 chronic kidney disease, or unspecified chronic kidney disease: Secondary | ICD-10-CM | POA: Diagnosis present

## 2020-01-15 DIAGNOSIS — Z79891 Long term (current) use of opiate analgesic: Secondary | ICD-10-CM

## 2020-01-15 DIAGNOSIS — N183 Chronic kidney disease, stage 3 unspecified: Secondary | ICD-10-CM | POA: Diagnosis present

## 2020-01-15 DIAGNOSIS — I252 Old myocardial infarction: Secondary | ICD-10-CM

## 2020-01-15 DIAGNOSIS — I10 Essential (primary) hypertension: Secondary | ICD-10-CM | POA: Diagnosis not present

## 2020-01-15 DIAGNOSIS — G9389 Other specified disorders of brain: Secondary | ICD-10-CM | POA: Diagnosis present

## 2020-01-15 DIAGNOSIS — F411 Generalized anxiety disorder: Secondary | ICD-10-CM | POA: Diagnosis present

## 2020-01-15 LAB — URINE DRUG SCREEN, QUALITATIVE (ARMC ONLY)
Amphetamines, Ur Screen: NOT DETECTED
Barbiturates, Ur Screen: NOT DETECTED
Benzodiazepine, Ur Scrn: NOT DETECTED
Cannabinoid 50 Ng, Ur ~~LOC~~: NOT DETECTED
Cocaine Metabolite,Ur ~~LOC~~: NOT DETECTED
MDMA (Ecstasy)Ur Screen: NOT DETECTED
Methadone Scn, Ur: NOT DETECTED
Opiate, Ur Screen: NOT DETECTED
Phencyclidine (PCP) Ur S: NOT DETECTED
Tricyclic, Ur Screen: NOT DETECTED

## 2020-01-15 LAB — BASIC METABOLIC PANEL
Anion gap: 8 (ref 5–15)
Anion gap: 9 (ref 5–15)
BUN: 21 mg/dL (ref 8–23)
BUN: 21 mg/dL (ref 8–23)
CO2: 27 mmol/L (ref 22–32)
CO2: 26 mmol/L (ref 22–32)
Calcium: 9.5 mg/dL (ref 8.9–10.3)
Calcium: 9.2 mg/dL (ref 8.9–10.3)
Chloride: 101 mmol/L (ref 98–111)
Chloride: 103 mmol/L (ref 98–111)
Creatinine, Ser: 1.38 mg/dL — ABNORMAL HIGH (ref 0.61–1.24)
Creatinine, Ser: 1.28 mg/dL — ABNORMAL HIGH (ref 0.61–1.24)
GFR calc Af Amer: >60 mL/min (ref >60)
GFR calc Af Amer: >60 mL/min (ref >60)
GFR calc non Af Amer: 54 mL/min — ABNORMAL LOW (ref >60)
GFR calc non Af Amer: 59 mL/min — ABNORMAL LOW (ref >60)
Glucose, Bld: 180 mg/dL — ABNORMAL HIGH (ref 70–99)
Glucose, Bld: 114 mg/dL — ABNORMAL HIGH (ref 70–99)
Potassium: 3.8 mmol/L (ref 3.5–5.1)
Potassium: 3.6 mmol/L (ref 3.5–5.1)
Sodium: 136 mmol/L (ref 135–145)
Sodium: 138 mmol/L (ref 135–145)

## 2020-01-15 LAB — CBC
HCT: 42.9 % (ref 39.0–52.0)
HCT: 42.7 % (ref 39.0–52.0)
Hemoglobin: 14.6 g/dL (ref 13.0–17.0)
Hemoglobin: 14.3 g/dL (ref 13.0–17.0)
MCH: 29.9 pg (ref 26.0–34.0)
MCH: 29.6 pg (ref 26.0–34.0)
MCHC: 34 g/dL (ref 30.0–36.0)
MCHC: 33.5 g/dL (ref 30.0–36.0)
MCV: 87.7 fL (ref 80.0–100.0)
MCV: 88.4 fL (ref 80.0–100.0)
Platelets: 245 10*3/uL (ref 150–400)
Platelets: 218 10*3/uL (ref 150–400)
RBC: 4.89 MIL/uL (ref 4.22–5.81)
RBC: 4.83 MIL/uL (ref 4.22–5.81)
RDW: 13.2 % (ref 11.5–15.5)
RDW: 13.4 % (ref 11.5–15.5)
WBC: 7.5 10*3/uL (ref 4.0–10.5)
WBC: 8 10*3/uL (ref 4.0–10.5)
nRBC: 0 % (ref 0.0–0.2)
nRBC: 0 % (ref 0.0–0.2)

## 2020-01-15 LAB — URINALYSIS, COMPLETE (UACMP) WITH MICROSCOPIC
Bacteria, UA: NONE SEEN
Bilirubin Urine: NEGATIVE
Glucose, UA: NEGATIVE mg/dL
Hgb urine dipstick: NEGATIVE
Ketones, ur: NEGATIVE mg/dL
Leukocytes,Ua: NEGATIVE
Nitrite: NEGATIVE
Protein, ur: NEGATIVE mg/dL
Specific Gravity, Urine: 1.04 — ABNORMAL HIGH (ref 1.005–1.030)
pH: 6 (ref 5.0–8.0)

## 2020-01-15 LAB — TROPONIN I (HIGH SENSITIVITY)
Troponin I (High Sensitivity): 189 ng/L (ref <18)
Troponin I (High Sensitivity): 144 ng/L (ref <18)
Troponin I (High Sensitivity): 166 ng/L (ref <18)

## 2020-01-15 LAB — SARS CORONAVIRUS 2 BY RT PCR (HOSPITAL ORDER, PERFORMED IN ~~LOC~~ HOSPITAL LAB): SARS Coronavirus 2: NEGATIVE

## 2020-01-15 MED ORDER — SODIUM CHLORIDE 0.9% FLUSH: 3.0000 mL | Freq: Once | INTRAVENOUS | Status: DC

## 2020-01-15 MED ORDER — ASPIRIN EC 325 MG PO TBEC
325.0000 mg | DELAYED_RELEASE_TABLET | Freq: Every day | ORAL | Status: DC
  Administered 2020-01-16: 10:00:00 325 mg via ORAL
  Filled 2020-01-15: qty 1

## 2020-01-15 MED ORDER — SENNOSIDES-DOCUSATE SODIUM 8.6-50 MG PO TABS: 1.0000 | ORAL_TABLET | Freq: Every evening | ORAL | Status: DC | PRN

## 2020-01-15 MED ORDER — ACETAMINOPHEN 650 MG RE SUPP: 650.0000 mg | RECTAL | Status: DC | PRN

## 2020-01-15 MED ORDER — STROKE: EARLY STAGES OF RECOVERY BOOK: Freq: Once | Status: AC

## 2020-01-15 MED ORDER — SODIUM CHLORIDE 0.9 % IV BOLUS
1000.0000 mL | Freq: Once | INTRAVENOUS | Status: AC
  Administered 2020-01-15: 1000 mL via INTRAVENOUS

## 2020-01-15 MED ORDER — IOHEXOL 350 MG/ML SOLN
75.0000 mL | Freq: Once | INTRAVENOUS | Status: AC | PRN
  Administered 2020-01-15: 75 mL via INTRAVENOUS

## 2020-01-15 MED ORDER — LABETALOL HCL 5 MG/ML IV SOLN
5.0000 mg | INTRAVENOUS | Status: DC | PRN
  Administered 2020-01-15 – 2020-01-17 (×4): 5 mg via INTRAVENOUS
  Filled 2020-01-15 (×4): qty 4

## 2020-01-15 MED ORDER — ACETAMINOPHEN 160 MG/5ML PO SOLN
650.0000 mg | ORAL | Status: DC | PRN
  Filled 2020-01-15: qty 20.3

## 2020-01-15 MED ORDER — ASPIRIN 81 MG PO CHEW
324.0000 mg | CHEWABLE_TABLET | Freq: Once | ORAL | Status: AC
  Administered 2020-01-15: 324 mg via ORAL
  Filled 2020-01-15: qty 4

## 2020-01-15 MED ORDER — ACETAMINOPHEN 325 MG PO TABS: 650.0000 mg | ORAL_TABLET | ORAL | Status: DC | PRN

## 2020-01-15 MED ORDER — ASPIRIN 300 MG RE SUPP: 300.0000 mg | Freq: Every day | RECTAL | Status: DC

## 2020-01-15 NOTE — ED Notes (Signed)
Pt on phone with MRI

## 2020-01-15 NOTE — ED Notes (Signed)
PT taken to MRI

## 2020-01-15 NOTE — H&P (Signed)
History and Physical    Howard SiaRichard J Denley ZOX:[id number] DOB: 10-07-1955 DOA: 01/15/2020  PCP: Carlean JewsBoscia, Heather E, NP  Patient coming from: Home  I have personally briefly reviewed patient's old medical [facility]  Chief Complaint: Dizziness, nausea, diaphoresis  HPI: Howard Weaver is a 64 y.o. male with medical history significant for CAD, SVT, hypertension, CKD stage III, PVD, history [place] brain [place], and GAD who presents to the ED for evaluation of dizziness.  Patient states he was in his usual state of health until earlier this morning (01/15/2020).  He says he was sitting down outside working with his hands without strenuous activity when he all of a sudden began to feel dizzy.  He describes his dizziness as an off balance sensation rather than room spinning sensation.  He broke out in sweats and had associated nausea with vomiting.  He was able to walk but with unsteady gait.  He did not lose consciousness or fall.  He denies any associated chest pain, palpitations, dyspnea, cough, abdominal pain, diarrhea, dysuria, change in sensation, weakness in his extremities.  Symptoms lasted around 3 hours.  He came to the ED for further evaluation as he reports a history of brain aneurysm and he was concerned this might have reoccurred.  He is now asymptomatic and feels back at his baseline.  He says he had a recent blood pressure medication change a few weeks ago but not sure which medicine was changed.  ED Course:  Initial vitals showed BP 146/110, pulse 60, RR 18, temp 98.2 Fahrenheit, SPO2 100% on room air.  Labs notable for sodium 136, potassium 3.8, bicarb 27, BUN 21, creatinine 1.38, serum glucose 180, WBC 7.5, hemoglobin 14.6, platelets 245,000.  High-sensitivity troponin I 189 >> 144.  Urinalysis negative for UTI.  UDS is negative.  SARS-CoV-2 PCR is collected and pending.  CT angiography head is negative for acute intracranial abnormality or  aneurysm.  Chronic right frontal infarct, left cerebellar encephalomalacia underlying craniotomy are noted.  Patient was given 1 L normal saline and aspirin 324 mg.  MRI brain was ordered and pending.  The hospitalist service was consulted tonight for further evaluation and management.  Review of Systems: All systems reviewed and are negative except as documented in history of present illness above.   Past Medical History:  Diagnosis Date   CAD (coronary artery disease)    Chest pain    DU (duodenal ulcer)    GAD (generalized anxiety disorder)    Hypertension    Myocardial infarction [facility](HCC)    Radiculopathy of thoracolumbar region    SVT (supraventricular tachycardia) (HCC)    Tachycardia     Past Surgical History:  Procedure Laterality Date   BRAIN SURGERY     CARDIAC CATHETERIZATION Right 11/25/2015   Procedure: Left Heart Cath and Coronary Angiography;  Surgeon: Laurier NancyShaukat A Khan, MD;  Location: [facility] INVASIVE CV LAB;  Service: Cardiovascular;  Laterality: Right;    Social History:  reports that he quit smoking about 44 years ago. He has never used smokeless tobacco. He reports that he does not drink alcohol or use drugs.  Allergies  Allergen Reactions   Penicillins Anaphylaxis    Has patient had a PCN reaction causing immediate rash, facial/tongue/throat swelling, SOB or lightheadedness with hypotension: Yes Has patient had a PCN reaction causing severe rash involving mucus membranes or skin necrosis: Yes Has patient had a PCN reaction that required hospitalization Yes Has patient had  a PCN reaction occurring within the last 10 years: No If all of the above answers are "NO", then may proceed with Cephalosporin use.    Ibuprofen Other (See Comments)    Gi upset    Family History  Problem Relation Age of Onset   Hypertension Mother    Diabetes Mother    Depression Mother    Hypertension Father      Prior to Admission medications   Medication Sig Start  Date End Date Taking? Authorizing Provider  benazepril (LOTENSIN) 20 MG tablet TAKE 1 TABLET BY MOUTH ONCE DAILY 12/11/19  Yes Scarboro, Coralee North, NP  busPIRone (BUSPAR) 7.5 MG tablet Take 1 tablet (7.5 mg total) by mouth 2 (two) times daily. 10/02/19  Yes Scarboro, Coralee North, NP  cloNIDine (CATAPRES) 0.3 MG tablet Take 1 tablet (0.3 mg total) by mouth 2 (two) times daily. 08/30/19  Yes Scarboro, Coralee North, NP  DULoxetine (CYMBALTA) 60 MG capsule TAKE 1 CAPSULE BY MOUTH ONCE DAILY 11/06/19  Yes Scarboro, Coralee North, NP  hydrochlorothiazide (HYDRODIURIL) 25 MG tablet Take 1 tablet (25 mg total) by mouth daily. 08/30/19  Yes Scarboro, Coralee North, NP  isosorbide mononitrate (IMDUR) 30 MG 24 hr tablet Take 30 mg by mouth daily.   Yes [provider]  metoprolol succinate (TOPROL-XL) 100 MG 24 hr tablet Take 1 tablet (100 mg total) by mouth daily. 08/30/19  Yes Scarboro, Coralee North, NP  pantoprazole (PROTONIX) 40 MG tablet TAKE 1 TABLET BY MOUTH TWICE DAILY 11/06/19  Yes Scarboro, Coralee North, NP  rosuvastatin (CRESTOR) 20 MG tablet TAKE 1 TABLET BY MOUTH ONCE DAILY 11/06/19  Yes Scarboro, Coralee North, NP  tiZANidine (ZANAFLEX) 4 MG tablet Take 1 tablet (4 mg total) by mouth every 8 (eight) hours as needed for muscle spasms. 03/21/18  Yes Boscia, Kathlynn Grate, NP  traMADol (ULTRAM) 50 MG tablet Take 1 tablet (50 mg total) by mouth every 12 (twelve) hours as needed. 03/21/18  Yes Carlean Jews, NP    Physical Exam: Vitals:   01/15/20 1730 01/15/20 1812 01/15/20 1955 01/15/20 2002  BP: (!) 189/129 (!) 175/124 (!) 162/121 (!) 160/120  Pulse: (!) 57 (!) 59 66   Resp: 16 14 (!) 24   Temp:      TempSrc:      SpO2: 99% 100% 100%   Weight:      Height:       Constitutional: Sitting up at the edge of bed, NAD, calm, comfortable Eyes: PERRL, lids and conjunctivae normal ENMT: Mucous membranes are moist. Posterior pharynx clear of any exudate or lesions.Normal dentition.  Neck: normal, supple, no masses. Respiratory: clear to  auscultation bilaterally, no wheezing, no crackles. Normal respiratory effort. No accessory muscle use.  Cardiovascular: Regular rate and rhythm, no murmurs / rubs / gallops. No extremity edema. 2+ pedal pulses. Abdomen: no tenderness, no masses palpated. No hepatosplenomegaly. Bowel sounds positive.  Musculoskeletal: no clubbing / cyanosis. No joint deformity upper and lower extremities. Good ROM, no contractures. Normal muscle tone.  Skin: no rashes, lesions, ulcers. No induration Neurologic: CN 2-12 grossly intact. Sensation intact, Strength 5/5 in all 4.  Psychiatric: Normal judgment and insight. Alert and oriented x 3. Normal mood.   Labs on Admission: I have personally reviewed following labs and imaging studies  CBC: Recent Labs  Lab 01/15/20 1059  WBC 7.5  HGB 14.6  HCT 42.9  MCV 87.7  PLT 245   Basic Metabolic Panel: Recent Labs  Lab 01/15/20 1059  NA 136  K 3.8  CL 101  CO2 27  GLUCOSE 180*  BUN 21  CREATININE 1.38*  CALCIUM 9.5   GFR: Estimated Creatinine Clearance: 62.2 mL/min (A) (by C-G formula based on SCr of 1.38 mg/dL (H)). Liver Function Tests: No results for input(s): AST, ALT, ALKPHOS, BILITOT, PROT, ALBUMIN in the last 168 hours. No results for input(s): LIPASE, AMYLASE in the last 168 hours. No results for input(s): AMMONIA in the last 168 hours. Coagulation Profile: No results for input(s): INR, PROTIME in the last 168 hours. Cardiac Enzymes: No results for input(s): CKTOTAL, CKMB, CKMBINDEX, TROPONINI in the last 168 hours. BNP (last 3 results) No results for input(s): PROBNP in the last 8760 hours. HbA1C: No results for input(s): HGBA1C in the last 72 hours. CBG: No results for input(s): GLUCAP in the last 168 hours. Lipid Profile: No results for input(s): CHOL, HDL, LDLCALC, TRIG, CHOLHDL, LDLDIRECT in the last 72 hours. Thyroid Function Tests: No results for input(s): TSH, T4TOTAL, FREET4, T3FREE, THYROIDAB in the last 72 hours. Anemia  Panel: No results for input(s): VITAMINB12, FOLATE, FERRITIN, TIBC, IRON, RETICCTPCT in the last 72 hours. Urine analysis:    Component Value Date/Time   COLORURINE YELLOW (A) 01/15/2020 1813   APPEARANCEUR CLEAR (A) 01/15/2020 1813   APPEARANCEUR Clear 08/14/2019 0831   LABSPEC 1.040 (H) 01/15/2020 1813   LABSPEC 1.035 05/01/2014 0939   PHURINE 6.0 01/15/2020 1813   GLUCOSEU NEGATIVE 01/15/2020 1813   GLUCOSEU Negative 05/01/2014 0939   HGBUR NEGATIVE 01/15/2020 1813   BILIRUBINUR NEGATIVE 01/15/2020 1813   BILIRUBINUR Negative 08/14/2019 0831   BILIRUBINUR Negative 05/01/2014 0939   KETONESUR NEGATIVE 01/15/2020 1813   PROTEINUR NEGATIVE 01/15/2020 1813   NITRITE NEGATIVE 01/15/2020 1813   LEUKOCYTESUR NEGATIVE 01/15/2020 1813   LEUKOCYTESUR Negative 05/01/2014 0939    Radiological Exams on Admission: CT Angio Head W or Wo Contrast  Result Date: 01/15/2020 CLINICAL DATA:  Sudden onset headache and dizziness, history of aneurysm EXAM: CT ANGIOGRAPHY HEAD TECHNIQUE: Multidetector CT imaging of the head was performed using the standard protocol during bolus administration of intravenous contrast. Multiplanar CT image reconstructions and MIPs were obtained to evaluate the vascular anatomy. CONTRAST:  41mL OMNIPAQUE IOHEXOL 350 MG/ML SOLN COMPARISON:  None. FINDINGS: CT HEAD Brain: There is no acute intracranial hemorrhage, mass effect, or edema. Gray-white differentiation is preserved. Patchy and confluent areas of hypoattenuation in the supratentorial white matter nonspecific but may reflect advanced chronic microvascular ischemic changes. Chronic right frontal infarct. There is left cerebellar encephalomalacia underlying craniotomy. There is no extra-axial fluid collection. Prominence of the ventricles and sulci reflects minor generalized parenchymal volume loss. Vascular: There is atherosclerotic calcification at the skull base. Skull: Small left lateral suboccipital craniotomy.  Sinuses/Orbits: No acute finding. Other: None. CTA HEAD Anterior circulation: Intracranial internal carotid arteries are patent with calcified plaque causing mild stenosis on the right and up to moderate stenosis on the left. Anterior and middle cerebral arteries are patent. Posterior circulation: Intracranial vertebral arteries are patent. There is calcified and noncalcified plaque causing mild to moderate stenosis. Basilar artery is patent. Posterior cerebral arteries are patent. Venous sinuses: Not well evaluated. IMPRESSION: No acute intracranial abnormality.  Chronic findings detailed above. No aneurysm identified. Electronically Signed   By: Guadlupe Spanish M.D.   On: 01/15/2020 12:14   MR Brain Wo Contrast (neuro protocol)  Result Date: 01/15/2020 CLINICAL DATA:  64 year old male with sudden onset headache and dizziness. History of left suboccipital craniotomy. EXAM: MRI HEAD WITHOUT  CONTRAST TECHNIQUE: Multiplanar, multiecho pulse sequences of the brain and surrounding structures were obtained without intravenous contrast. COMPARISON:  CTA head and neck earlier today.  Head CT 06/23/2013. FINDINGS: Brain: There are several small linear foci of restricted diffusion in the right cerebellar hemisphere (series 5). Associated T2 and FLAIR hyperintensity with no definite acute hemorrhage and no mass effect. Furthermore there are 4-5 similar small foci of restricted diffusion in both cerebral hemispheres, more numerous on the right and including right basal ganglia and mesial temporal lobe involvement. No mass effect or definite acute hemorrhage. However, extensive chronic microhemorrhages scattered in the bilateral cerebellum, superimposed on more postoperative appearing hemosiderin and encephalomalacia in the left cerebellar hemisphere underlying the craniotomy. Chronic microhemorrhages are occasionally noted in the brainstem. Multiple similar chronic microhemorrhages in the bilateral deep gray matter nuclei,  and occasionally elsewhere in the cerebral hemispheres. There is a small area of cortical encephalomalacia with hemosiderin in the anterior superior right frontal gyrus. Superimposed widespread patchy bilateral cerebral white matter T2 and FLAIR hyperintensity, and T2 heterogeneity throughout the deep gray matter nuclei. No other cortical encephalomalacia. But there is also focal encephalomalacia of the body of the corpus callosum. No midline shift, mass effect, evidence of mass lesion, ventriculomegaly, extra-axial collection or acute intracranial hemorrhage. Cervicomedullary junction and pituitary are within normal limits. Vascular: Major intracranial vascular flow voids are preserved, with generalized intracranial artery tortuosity. Skull and upper cervical spine: Negative visible cervical spine and bone marrow signal. Left suboccipital craniotomy better demonstrated by CT. Sinuses/Orbits: Negative orbits. Mild paranasal sinus mucosal thickening. Other: Visible internal auditory structures appear normal. Mastoids are clear. Mild left suboccipital scalp soft tissue scarring. IMPRESSION: 1. Positive for approximately 9 small foci of acute ischemia scattered in both cerebral hemispheres and the right cerebellar hemisphere. No acute hemorrhage or mass effect. This pattern is most suspicious for recent embolic event from the heart or proximal aorta, although synchronous small vessel disease may also be possible (see #2). 2. Numerous chronic micro-hemorrhages in the cerebellum, also the brainstem, the deep gray nuclei. Comparatively little involvement of the cerebral hemispheres, arguing against amyloid angiopathy. Small chronic cortical infarct in the right superior frontal gyrus with hemosiderin. Extensive bilateral white matter and deep gray nuclei signal heterogeneity, and focal encephalomalacia in the body of the corpus callosum. 3. Superimposed chronic postoperative changes to the left cerebellar hemisphere with  encephalomalacia. Electronically Signed   By: Odessa Fleming M.D.   On: 01/15/2020 20:57    EKG: Independently reviewed.  Normal sinus rhythm, PVC, mild ST elevation with V1 and 3.  Rate is slower when compared to prior.  Assessment/Plan Principal Problem:   Acute ischemic multifocal multiple vascular territories stroke [facility]) Active Problems:   CAD (coronary artery disease)   GAD (generalized anxiety disorder)   CKD (chronic kidney disease), stage III   Essential hypertension   Dizziness   Elevated troponin  BIAGIO SNELSON is a 64 y.o. male with medical history significant for CAD, SVT, hypertension, CKD stage III, PVD, and GAD who is admitted with multifocal ischemic CVA.  Multifocal acute ischemic CVA: MRI brain showing approximately 9 small foci of acute ischemia scattered in both cerebral hemispheres in the right cerebellar hemisphere.  No acute hemorrhage or mass-effect seen.  Concern for embolic etiology.  Numerous chronic microhemorrhages also noted. -Continue aspirin 325 mg -Obtain echocardiogram, if negative will likely need TEE -CTA head negative for acute intracranial abnormality or aneurysm.  Did show mild stenosis on the right internal carotid and  to moderate stenosis on the left. -Carotid Dopplers -Will obtain lower extremity Dopplers to assess for DVT -PT/OT/SLP eval -Check A1c, lipid panel -Monitor on telemetry, neurochecks -Allow permissive hypertension for now -Teleneurology consultation requested  CAD with elevated troponin: Patient with slight troponin elevation.  Denies any chest pain.  EKG with nonspecific mild ST changes.  Will cycle cardiac enzymes and continue work-up as above with echocardiogram, telemetry, and continue aspirin.  Hypertension: Home chlorthalidone, Imdur, Toprol-XL on hold to allow for permissive hypertension for now.  Use IV labetalol as needed.  CKD stage III: Chronic and appears stable.  Continue to monitor.  Hyperlipidemia: Not  currently on statin therapy.  Checking lipid panel as above, likely will need to be started on high intensity atorvastatin or rosuvastatin.  DVT prophylaxis: SCDs Code Status: DNR, confirmed with patient Family Communication: Discussed with patient, he has discussed with family Disposition Plan: From home and likely discharge to home pending further stroke work-up. Consults called: Teleneurology Admission status:  Status is: Inpatient  Remains inpatient appropriate because:Ongoing diagnostic testing needed not appropriate for outpatient work up   Dispo: The patient is from: Home              Anticipated d/c is to: Home              Anticipated d/c date is: 2 days pending further stroke work-up              Patient currently is not medically stable to d/c.  Zada Finders MD [facility]  If 7PM-7AM, please contact night-coverage [email or web address]  01/15/2020, 9:33 PM

## 2020-01-15 NOTE — ED Notes (Signed)
Pt ambulated to restroom.  Unsteady gait noted. Son remains in bathroom with pt while attempting urine specimen.  BP remains elevated, dr monks aware.  Pt dizzy upon ambulation.

## 2020-01-15 NOTE — ED Notes (Signed)
Several attempts to obtain oral and axillary temp without success.

## 2020-01-15 NOTE — ED Triage Notes (Signed)
Pt in via from home with c/o sudden onset of dizziness and headache at rest. EMS reports. EMS reports pt felt very unsteady on his feet. Pt had an aneurysm several years ago and presented with the same sx's.  #18 left AC, EMS administered 4mg   of zofran en route,

## 2020-01-15 NOTE — ED Notes (Signed)
Attempted to call report

## 2020-01-15 NOTE — ED Notes (Signed)
Per MD Allena Katz, if pt orthostatic vitals look good, blood pressure meds will be restarted as normal

## 2020-01-15 NOTE — ED Provider Notes (Signed)
[facility] Emergency Department Provider Note  ____________________________________________   First MD Initiated Contact with Patient 01/15/20 1543     (approximate)  I have reviewed the triage vital signs and the nursing notes.  History  Chief Complaint Dizziness    HPI Howard Weaver is a 64 y.o. male past medical history as below, who presents to the emergency department for an episode of dizziness, lightheadedness, headache.  Patient states he was doing some non-exertional work in his backyard when he developed acute onset of lightheadedness, unsteadiness, dizziness, and headache.  Also associated with sweatiness.  Symptoms worse with standing.  No associated chest pain, shortness of breath, palpitations, facial droop, weakness, numbness, or tingling.  Patient states symptoms are similar to when he was diagnosed with a brain aneurysm, which prompted him to seek care.  In total, the episode lasted around 3 hours, and is now asymptomatic in the ED.  Does note that his cardiologist recently changed one of his fluid pills last week, but is unsure what the name of the pill is.  No other changes to his medication regimen.   Past Medical Hx Past Medical History:  Diagnosis Date  . CAD (coronary artery disease)   . Chest pain   . DU (duodenal ulcer)   . GAD (generalized anxiety disorder)   . Hypertension   . Myocardial infarction (HCC)   . Radiculopathy of thoracolumbar region   . SVT (supraventricular tachycardia) (HCC)   . Tachycardia     Problem List Patient Active Problem List   Diagnosis Date Noted  . Encounter for general adult medical examination with abnormal findings 04/09/2018  . Essential hypertension 11/27/2017  . Mixed hyperlipidemia 11/27/2017  . Peptic ulcer of stomach, chronic 11/27/2017  . Low back pain without sciatica 11/27/2017  . Accelerated hypertension 01/21/2017  . CAD (coronary artery disease) 01/21/2017  . GAD  (generalized anxiety disorder) 01/21/2017  . CKD (chronic kidney disease), stage III 01/21/2017  . Chest pain 11/25/2015    Past Surgical Hx Past Surgical History:  Procedure Laterality Date  . BRAIN SURGERY    . CARDIAC CATHETERIZATION Right 11/25/2015   Procedure: Left Heart Cath and Coronary Angiography;  Surgeon: Laurier Nancy, MD;  Location: [facility] INVASIVE CV LAB;  Service: Cardiovascular;  Laterality: Right;    Medications Prior to Admission medications   Medication Sig Start Date End Date Taking? Authorizing Provider  benazepril (LOTENSIN) 20 MG tablet TAKE 1 TABLET BY MOUTH ONCE DAILY 12/11/19   Johnna Acosta, NP  busPIRone (BUSPAR) 7.5 MG tablet Take 1 tablet (7.5 mg total) by mouth 2 (two) times daily. 10/02/19   Johnna Acosta, NP  cloNIDine (CATAPRES) 0.3 MG tablet Take 1 tablet (0.3 mg total) by mouth 2 (two) times daily. 08/30/19   Johnna Acosta, NP  DULoxetine (CYMBALTA) 60 MG capsule TAKE 1 CAPSULE BY MOUTH ONCE DAILY 11/06/19   Johnna Acosta, NP  hydrochlorothiazide (HYDRODIURIL) 25 MG tablet Take 1 tablet (25 mg total) by mouth daily. 08/30/19   Johnna Acosta, NP  isosorbide mononitrate (IMDUR) 30 MG 24 hr tablet Take 30 mg by mouth daily.    [provider]  metoprolol succinate (TOPROL-XL) 100 MG 24 hr tablet Take 1 tablet (100 mg total) by mouth daily. 08/30/19   Johnna Acosta, NP  pantoprazole (PROTONIX) 40 MG tablet TAKE 1 TABLET BY MOUTH TWICE DAILY 11/06/19   Johnna Acosta, NP  rosuvastatin (CRESTOR) 20 MG tablet TAKE 1 TABLET  BY MOUTH ONCE DAILY 11/06/19   Johnna Acosta, NP  tiZANidine (ZANAFLEX) 4 MG tablet Take 1 tablet (4 mg total) by mouth every 8 (eight) hours as needed for muscle spasms. 03/21/18   Carlean Jews, NP  traMADol (ULTRAM) 50 MG tablet Take 1 tablet (50 mg total) by mouth every 12 (twelve) hours as needed. 03/21/18   Carlean Jews, NP    Allergies Penicillins and Ibuprofen  Family Hx Family History  Problem  Relation Age of Onset  . Hypertension Mother   . Diabetes Mother   . Depression Mother   . Hypertension Father     Social Hx Social History   Tobacco Use  . Smoking status: Former Smoker    Quit date: 11/25/1975    Years since quitting: 44.1  . Smokeless tobacco: Never Used  Substance Use Topics  . Alcohol use: No  . Drug use: No     Review of Systems  Constitutional: Negative for fever. Negative for chills. Eyes: Negative for visual changes. ENT: Negative for sore throat. Cardiovascular: Negative for chest pain. Respiratory: Negative for shortness of breath. Gastrointestinal: Negative for nausea. Negative for vomiting.  Genitourinary: Negative for dysuria. Musculoskeletal: Negative for leg swelling. Skin: Negative for rash. Neurological: Positive for headache, lightheadedness, dizziness.   Physical Exam  Vital Signs: ED Triage Vitals  Enc Vitals Group     BP 01/15/20 1055 (!) 146/110     Pulse Rate 01/15/20 1055 60     Resp 01/15/20 1055 18     Temp --      Temp Source 01/15/20 1055 Oral     SpO2 01/15/20 1055 100 %     Weight 01/15/20 1056 222 lb (100.7 kg)     Height 01/15/20 1056 5\' 8"  (1.727 m)     Head Circumference --      Peak Flow --      Pain Score 01/15/20 1056 8     Pain Loc --      Pain Edu? --      Excl. in GC? --     Constitutional: Alert and oriented. Well appearing. NAD.  Head: Normocephalic. Atraumatic. Eyes: Conjunctivae clear. Sclera anicteric. Pupils equal and symmetric. Nose: No masses or lesions. No congestion or rhinorrhea. Mouth/Throat: Wearing mask.  Neck: No stridor. Trachea midline.  Cardiovascular: Normal rate, regular rhythm. Extremities well perfused. Respiratory: Normal respiratory effort.  Lungs CTAB. Gastrointestinal: Soft. Non-distended. Non-tender.  Genitourinary: Deferred. Musculoskeletal: No lower extremity edema. No deformities. Neurologic:  Normal speech and language. No gross focal or lateralizing neurologic  deficits are appreciated. Alert and oriented.  Face symmetric.  Tongue midline.  Cranial nerves II through XII intact. UE and LE strength 5/5 and symmetric. UE and LE SILT. NIHSS 0.  Skin: Skin is warm, dry and intact. No rash noted. Psychiatric: Mood and affect are appropriate for situation.  EKG  Personally reviewed and interpreted by myself.   Date: 01/15/20 Time: 1105 Rate: 63 Rhythm: sinus Axis: left Intervals: WNL Mild ST depression I, TWI aVL PVC No STEMI  Repeat obtained at 1809 Rate: 61 Rhythm: sinus Axis: normal Intervals: within normal limits ST changes in 1 and aVL less prominent, ST/T wave changes lateral precordial leads, perhaps related to LVH    Radiology  Personally reviewed available imaging myself.   CT angio head w/ or w/o  - IMPRESSION:  No acute intracranial abnormality. Chronic findings detailed above.  No aneurysm identified.   MRI ordered, pending   Procedures  Procedure(s) performed (including critical care):  Procedures   Initial Impression / Assessment and Plan / MDM / ED Course  64 y.o. male who presents to the ED for an episode of lightheadedness/dizziness, headache, sweatiness. Asymptomatic on arrival to ED.   Ddx: intracranial etiology, such as bleed/aneurysm (given his hx) or CVA (though on arrival he is asymptomatic and NIHSS 0, so would not pursue tPA), symptomatic orthostatic hypotension, dehydration, electrolyte abnormality, atypical ACS  Will plan for labs, imaging, fluids  Initial EKG slightly abnormal as noted above, though does not meet STEMI criteria.  Troponin 189 > 144, concerning for potential cardiac etiology of his symptoms, or at least stressor type event (perhaps heat stress from working outside) with resultant cardiac stress.  CTA head negative for acute intracranial abnormality or aneurysm, follow-up MRI ordered to rule out CVA.  In the setting of his abnormal EKG and troponins, will plan for admission for further  work-up.  Full dose ASA given, heparin held for now given downtrending troponins and as patient is asymptomatic, discussed w/ hospitalist who is in agreement w/ this plan.  Repeat EKG w/o significant changes.  MRI pending.  Patient agreeable with admission and plan as above.   _______________________________   As part of my medical decision making I have reviewed available labs, radiology tests, reviewed old records/performed chart review, obtained additional history from family, son at bedside.     Final Clinical Impression(s) / ED Diagnosis  Final diagnoses:  Dizziness  Lightheadedness  Abnormal EKG  Elevated troponin       Note:  This document was prepared using Dragon voice recognition software and may include unintentional dictation errors.   Lilia Pro., MD 01/15/20 [id number]

## 2020-01-15 NOTE — ED Notes (Signed)
PT given phone to call family

## 2020-01-15 NOTE — ED Triage Notes (Signed)
Pt states he was outside in the yard and had sudden onset HA with dizziness and nausea with a hx of brain aneurysum   See first nurse note for more information

## 2020-01-15 NOTE — ED Notes (Signed)
Message sent to Dr. Allena Katz regarding pt BP. Awaiting orders.

## 2020-01-15 NOTE — ED Notes (Signed)
Spoke with lab to make sure troponin gets added to 11 am blood work.

## 2020-01-16 ENCOUNTER — Inpatient Hospital Stay (HOSPITAL_COMMUNITY)
Admit: 2020-01-16 | Discharge: 2020-01-16 | Disposition: A | Discharge status: Home / Self Care | Payer: Medicare Other | Attending: Internal Medicine | Admitting: Internal Medicine

## 2020-01-16 ENCOUNTER — Inpatient Hospital Stay: Payer: Medicare Other

## 2020-01-16 DIAGNOSIS — I10 Essential (primary) hypertension: Secondary | ICD-10-CM

## 2020-01-16 DIAGNOSIS — I6389 Other cerebral infarction: Secondary | ICD-10-CM

## 2020-01-16 DIAGNOSIS — I82409 Acute embolism and thrombosis of unspecified deep veins of unspecified lower extremity: Secondary | ICD-10-CM

## 2020-01-16 HISTORY — DX: Acute embolism and thrombosis of unspecified deep veins of unspecified lower extremity: I82.409

## 2020-01-16 LAB — C-REACTIVE PROTEIN: CRP: 0.5 mg/dL (ref <1.0)

## 2020-01-16 LAB — LIPID PANEL
Cholesterol: 148 mg/dL (ref 0–200)
HDL: 36 mg/dL — ABNORMAL LOW (ref >40)
LDL Cholesterol: 92 mg/dL (ref 0–99)
Total CHOL/HDL Ratio: 4.1 RATIO
Triglycerides: 99 mg/dL (ref <150)
VLDL: 20 mg/dL (ref 0–40)

## 2020-01-16 LAB — HEMOGLOBIN A1C
Hgb A1c MFr Bld: 6.3 % — ABNORMAL HIGH (ref 4.8–5.6)
Mean Plasma Glucose: 134 mg/dL

## 2020-01-16 LAB — TROPONIN I (HIGH SENSITIVITY)
Troponin I (High Sensitivity): 198 ng/L (ref <18)
Troponin I (High Sensitivity): 207 ng/L (ref <18)

## 2020-01-16 LAB — SEDIMENTATION RATE: Sed Rate: 16 mm/hr (ref 0–20)

## 2020-01-16 LAB — HIV ANTIBODY (ROUTINE TESTING W REFLEX): HIV Screen 4th Generation wRfx: NONREACTIVE

## 2020-01-16 MED ORDER — ROSUVASTATIN CALCIUM 20 MG PO TABS
20.0000 mg | ORAL_TABLET | Freq: Every day | ORAL | Status: DC
  Administered 2020-01-16 – 2020-01-17 (×2): 20 mg via ORAL
  Filled 2020-01-16 (×2): qty 1

## 2020-01-16 MED ORDER — METOPROLOL SUCCINATE ER 50 MG PO TB24
100.0000 mg | ORAL_TABLET | Freq: Every day | ORAL | Status: DC
  Administered 2020-01-17: 100 mg via ORAL
  Filled 2020-01-16: qty 2

## 2020-01-16 MED ORDER — ASPIRIN EC 81 MG PO TBEC
81.0000 mg | DELAYED_RELEASE_TABLET | Freq: Every day | ORAL | Status: DC
  Administered 2020-01-17: 10:00:00 81 mg via ORAL
  Filled 2020-01-16: qty 1

## 2020-01-16 MED ORDER — ENOXAPARIN SODIUM 40 MG/0.4ML ~~LOC~~ SOLN
40.0000 mg | SUBCUTANEOUS | Status: DC
  Administered 2020-01-16 – 2020-01-17 (×2): 40 mg via SUBCUTANEOUS
  Filled 2020-01-16 (×2): qty 0.4

## 2020-01-16 NOTE — Progress Notes (Signed)
Chart reviewed, Pt visited. No speech or language deficits noted. Pt reports speech is at baseline. No swallowing deficits, Pt ate 100 of meal today. NO ST needs identified. Please reconsult if further concerns arise.

## 2020-01-16 NOTE — Progress Notes (Signed)
Cross cover brief note Notified by RN patient communicated his preference to be full code. Confirmed with patient and order entered for full code status

## 2020-01-16 NOTE — Evaluation (Signed)
Occupational Therapy Evaluation Patient Details Name: ADEEL GUIFFRE MRN: [id number] DOB: 03/11/1956 Today's Date: 01/16/2020    History of Present Illness Patient is a 64 year old male with PMH of SVT, PVD, acute ischemic multifocal multiple vascular territories stroke, brain aneurysm s/p clipping in [place], CAD, GAD, CKD stage III, HTN, and dizziness. Was sitting outside when he felt dizzy, gait became unsteady, symptoms lasted 3 hours, and he came to the ED. Patient reports he walks unsteady but doesn't use an AD at baseline at home.   Clinical Impression   Mr. Knotek presents to OT with impaired balance that impacts his ability to safely and independently complete functional tasks.  Prior to admission, pt was independent in all ADLs and IADLs.  He lives with his sister in law and shares household management tasks with her.  He enjoys yard work and taking care of his dog.  He is a retired Administrator.  Pt used to use a SPC after brain aneurysm years ago, but no longer uses an AD with mobility.  Pt is generally requires supervision assist with functional mobility and standing ADLs due to impaired balance.  OTR provided supervision assist for sit to stand transfers, ambulation, and standing grooming at sink.  Pt had one loss of balance in transfer, but suspect was due to bed and not pt's balance (pt able to recover balance).  Pt does report dizziness with standing.  Pt has no other neurological symptoms with functional neurological assessment.  Mr. Stuck will continue to benefit from skilled OT services in acute setting to address safety and independence in ADLs in context of impaired balance.  He will benefit from a tub transfer bench after discharge to ensure safety when showering.  Do not anticipate any further OT needs after discharge.     Follow Up Recommendations  No OT follow up    Equipment Recommendations  Tub/shower bench    Recommendations for Other Services       Precautions /  Restrictions Precautions Precautions: None Precaution Comments: watch HR Restrictions Weight Bearing Restrictions: No      Mobility Bed Mobility Overal bed mobility: Independent             General bed mobility comments: supine to sit with no assistance, does not require use of UE's  Transfers Overall transfer level: Needs assistance               General transfer comment: mild LOB in sit to stand transfer, suspect was due to bed and not pt's balance.  Pt able to stand without UE support or AD.    Balance Overall balance assessment: Mild deficits observed, not formally tested;Needs assistance Sitting-balance support: Feet unsupported Sitting balance-Leahy Scale: Normal Sitting balance - Comments: able to don shoes and tie shoes without LOB   Standing balance support: No upper extremity supported;During functional activity Standing balance-Leahy Scale: Fair Standing balance comment: pt reports slight dizziness with standing, slight lateral sway with ambulation               High Level Balance Comments: limited single limb stance           ADL either performed or assessed with clinical judgement   ADL Overall ADL's : Needs assistance/impaired                                       General ADL Comments: OTR provided  supervision assist for seated and standing ADLs to manage lines/leads, pt reports mild dizziness with standing     Vision Baseline Vision/History: Wears glasses Wears Glasses: Reading only Patient Visual Report: No change from baseline Vision Assessment?: Yes Eye Alignment: Within Functional Limits Ocular Range of Motion: Within Functional Limits Alignment/Gaze Preference: Within Defined Limits Tracking/Visual Pursuits: Able to track stimulus in all quads without difficulty Saccades: Within functional limits Convergence: Within functional limits Visual Fields: No apparent deficits     Perception     Praxis       Pertinent Vitals/Pain Pain Assessment: No/denies pain     Hand Dominance Right   Extremity/Trunk Assessment Upper Extremity Assessment Upper Extremity Assessment: Overall WFL for tasks assessed   Lower Extremity Assessment Lower Extremity Assessment: Overall WFL for tasks assessed       Communication Communication Communication: No difficulties   Cognition Arousal/Alertness: Awake/alert Behavior During Therapy: WFL for tasks assessed/performed Overall Cognitive Status: Within Functional Limits for tasks assessed                                 General Comments: grossly oriented, pleasant and engaged in therapy   General Comments  patient appears well groomed and nourished.    Exercises Other Exercises Other Exercises: Patient educated on safe mobility and transfers, cues for breathing and keeping self calm for maintaining vitals in therapeutic range. Other Exercises: provided education re: OT role and plan of care, fall and safety precautions, self care, DME   Shoulder Instructions      Home Living Family/patient expects to be discharged to:: Private residence Living Arrangements: Other relatives(sister in law) Available Help at Discharge: Family;Available 24 hours/day Type of Home: House Home Access: Level entry     Home Layout: One level     Bathroom Shower/Tub: Teacher, early years/pre: Standard     Home Equipment: Cane - single point   Additional Comments: Patient reports he has a cane he used from his previous aneurysm but doesn't use now.      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 Does need time to get around since he sometimes feels unsteady.  He reports "rarely" driving, often walking to the store to get food, run errands, etc.  Shares cooking and cleaning responsibilities with his  sister in Sports coach.        OT Problem List: Decreased activity tolerance;Impaired balance (sitting and/or standing);Decreased knowledge of use of DME or AE      OT Treatment/Interventions: Self-care/ADL training;Therapeutic exercise;Neuromuscular education;DME and/or AE instruction;Energy conservation;Therapeutic activities;Patient/family education;Balance training    OT Goals(Current goals can be found in the care plan section) Acute Rehab OT Goals Patient Stated Goal: to go home OT Goal Formulation: With patient Time For Goal Achievement: 01/30/20 Potential to Achieve Goals: Good  OT Frequency: Min 1X/week   Barriers to D/C:            Co-evaluation              AM-PAC OT "6 Clicks" Daily Activity     Outcome Measure Help from another person eating meals?: None Help from another person taking care of personal grooming?: None Help from another person toileting, which includes using toliet, bedpan, or urinal?: A Little Help from another person bathing (including washing,  rinsing, drying)?: A Little Help from another person to put on and taking off regular upper body clothing?: None Help from another person to put on and taking off regular lower body clothing?: A Little 6 Click Score: 21   End of Session    Activity Tolerance: Patient tolerated treatment well Patient left: in bed;with call bell/phone within reach  OT Visit Diagnosis: Unsteadiness on feet (R26.81);Other symptoms and signs involving the nervous system (R29.898)                Time: 1638-4536 OT Time Calculation (min): 13 min Charges:  OT General Charges $OT Visit: 1 Visit OT Evaluation $OT Eval Moderate Complexity: 1 Mod OT Treatments $Self Care/Home Management : 8-22 mins  Kathyrn Drown Izadora Roehr, OTR/L 01/16/20, 12:20 PM

## 2020-01-16 NOTE — Consult Note (Addendum)
Reason for Consult: dizziness  Requesting Physician: Dr. Nelson Chimes   CC: dizziness    HPI: Howard Weaver is an 64 y.o. 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 Was sitting outside when he felt dizzy, gait became unsteady, symptoms lasted 3 hours, and he came to the ED. On MRI patient has bilateral cerebellar strokes along with chronic microhemorrahges. Now improved and back to baseline.   Past Medical History:  Diagnosis Date  . CAD (coronary artery disease)   . Chest pain   . DU (duodenal ulcer)   . GAD (generalized anxiety disorder)   . Hypertension   . Myocardial infarction (HCC)   . Radiculopathy of thoracolumbar region   . SVT (supraventricular tachycardia) (HCC)   . Tachycardia     Past Surgical History:  Procedure Laterality Date  . BRAIN SURGERY    . CARDIAC CATHETERIZATION Right 11/25/2015   Procedure: Left Heart Cath and Coronary Angiography;  Surgeon: Laurier Nancy, MD;  Location: [facility] INVASIVE CV LAB;  Service: Cardiovascular;  Laterality: Right;    Family History  Problem Relation Age of Onset  . Hypertension Mother   . Diabetes Mother   . Depression Mother   . Hypertension Father     Social History:  reports that he quit smoking about 44 years ago. He has never used smokeless tobacco. He reports that he does not drink alcohol or use drugs.  Allergies  Allergen Reactions  . Penicillins Anaphylaxis    Has patient had a PCN reaction causing immediate rash, facial/tongue/throat swelling, SOB or lightheadedness with hypotension: Yes Has patient had a PCN reaction causing severe rash involving mucus membranes or skin necrosis: Yes Has patient had a PCN reaction that required hospitalization Yes Has patient had a PCN reaction occurring within the last 10 years: No If all of the above answers are "NO", then may proceed with Cephalosporin  use.   . Ibuprofen Other (See Comments)    Gi upset    Medications: I have reviewed the patient's current medications.  ROS: History obtained from the patient  General ROS: negative for - chills, fatigue, fever, night sweats, weight gain or weight loss Psychological ROS: negative for - behavioral disorder, hallucinations, memory difficulties, mood swings or suicidal ideation Ophthalmic ROS: negative for - blurry vision, double vision, eye pain or loss of vision ENT ROS: negative for - epistaxis, nasal discharge, oral lesions, sore throat, tinnitus or vertigo Allergy and Immunology ROS: negative for - hives or itchy/watery eyes Hematological and Lymphatic ROS: negative for - bleeding problems, bruising or swollen lymph nodes Endocrine ROS: negative for - galactorrhea, hair pattern changes, polydipsia/polyuria or temperature intolerance Respiratory ROS: negative for - cough, hemoptysis, shortness of breath or wheezing Cardiovascular ROS: negative for - chest pain, dyspnea on exertion, edema or irregular heartbeat Gastrointestinal ROS: negative for - abdominal pain, diarrhea, hematemesis, nausea/vomiting or stool incontinence Genito-Urinary ROS: negative for - dysuria, hematuria, incontinence or urinary frequency/urgency Musculoskeletal ROS: negative for - joint swelling or muscular weakness Neurological ROS: as noted in HPI Dermatological ROS: negative for rash and skin lesion changes  Physical Examination: Blood pressure (!) 184/98, pulse 72, temperature 98.3 F (36.8 C), temperature source Oral, resp. rate 15, height 5\' 8"  (1.727 m), weight 100.7 kg, SpO2 99 %.    Neurological Examination   Mental Status: Alert, oriented, thought content appropriate.  Speech fluent without evidence of  aphasia.  Able to follow 3 step commands without difficulty. Cranial Nerves: II: Discs flat bilaterally; Visual fields grossly normal, pupils equal, round, reactive to light and  accommodation III,IV, VI: ptosis not present, extra-ocular motions intact bilaterally V,VII: smile symmetric, facial light touch sensation normal bilaterally VIII: hearing normal bilaterally IX,X: gag reflex present XI: bilateral shoulder shrug XII: midline tongue extension Motor: Right : Upper extremity   5/5    Left:     Upper extremity   5/5  Lower extremity   5/5     Lower extremity   5/5 Tone and bulk:normal tone throughout; no atrophy noted Sensory: Pinprick and light touch intact throughout, bilaterally Deep Tendon Reflexes: 1+ and symmetric throughout Plantars: Right: downgoing   Left: downgoing Cerebellar: normal finger-to-nose, normal rapid alternating movements and normal heel-to-shin test Gait: normal gait and station      Laboratory Studies:   Basic Metabolic Panel: Recent Labs  Lab 01/15/20 1059 01/15/20 2258  NA 136 138  K 3.8 3.6  CL 101 103  CO2 27 26  GLUCOSE 180* 114*  BUN 21 21  CREATININE 1.38* 1.28*  CALCIUM 9.5 9.2    Liver Function Tests: No results for input(s): AST, ALT, ALKPHOS, BILITOT, PROT, ALBUMIN in the last 168 hours. No results for input(s): LIPASE, AMYLASE in the last 168 hours. No results for input(s): AMMONIA in the last 168 hours.  CBC: Recent Labs  Lab 01/15/20 1059 01/15/20 2258  WBC 7.5 8.0  HGB 14.6 14.3  HCT 42.9 42.7  MCV 87.7 88.4  PLT 245 218    Cardiac Enzymes: No results for input(s): CKTOTAL, CKMB, CKMBINDEX, TROPONINI in the last 168 hours.  BNP: Invalid input(s): POCBNP  CBG: No results for input(s): GLUCAP in the last 168 hours.  Microbiology: Results for orders placed or performed during the hospital encounter of 01/15/20  SARS Coronavirus 2 by RT PCR (hospital order, performed in [facility] hospital lab) Nasopharyngeal Nasopharyngeal Swab     Status: None   Collection Time: 01/15/20  6:55 PM   Specimen: Nasopharyngeal Swab  Result Value Ref Range Status   SARS Coronavirus 2 NEGATIVE NEGATIVE  Final    Comment: (NOTE) SARS-CoV-2 target nucleic acids are NOT DETECTED. The SARS-CoV-2 RNA is generally detectable in upper and lower respiratory specimens during the acute phase of infection. The lowest concentration of SARS-CoV-2 viral copies this assay can detect is 250 copies / mL. A negative result does not preclude SARS-CoV-2 infection and should not be used as the sole basis for treatment or other patient management decisions.  A negative result may occur with improper specimen collection / handling, submission of specimen other than nasopharyngeal swab, presence of viral mutation(s) within the areas targeted by this assay, and inadequate number of viral copies (<250 copies / mL). A negative result must be combined with clinical observations, patient history, and epidemiological information. Fact Sheet for Patients:   BoilerBrush.com.cy Fact Sheet for Healthcare Providers: https://pope.com/ This test is not yet approved or cleared  by the [place] FDA and has been authorized for detection and/or diagnosis of SARS-CoV-2 by FDA under an Emergency Use Authorization (EUA).  This EUA will remain in effect (meaning this test can be used) for the duration of the COVID-19 declaration under Section 564(b)(1) of the Act, 21 U.S.C. section 360bbb-3(b)(1), unless the authorization is terminated or revoked sooner. Performed at [facility], [address] [place], [place] [address]     Coagulation Studies: No results for input(s): LABPROT,  INR in the last 72 hours.  Urinalysis:  Recent Labs  Lab 01/15/20 1813  COLORURINE YELLOW*  LABSPEC 1.040*  PHURINE 6.0  GLUCOSEU NEGATIVE  HGBUR NEGATIVE  BILIRUBINUR NEGATIVE  KETONESUR NEGATIVE  PROTEINUR NEGATIVE  NITRITE NEGATIVE  LEUKOCYTESUR NEGATIVE    Lipid Panel:     Component Value Date/Time   CHOL 148 01/16/2020 0055   CHOL 169 08/14/2019 0831   CHOL 213  (H) 06/24/2013 0416   TRIG 99 01/16/2020 0055   TRIG 155 06/24/2013 0416   HDL 36 (L) 01/16/2020 0055   HDL 42 08/14/2019 0831   HDL 37 (L) 06/24/2013 0416   CHOLHDL 4.1 01/16/2020 0055   VLDL 20 01/16/2020 0055   VLDL 31 06/24/2013 0416   LDLCALC 92 01/16/2020 0055   LDLCALC 102 (H) 08/14/2019 0831   LDLCALC 145 (H) 06/24/2013 0416    HgbA1C:  Lab Results  Component Value Date   HGBA1C 5.9 (A) 08/22/2019    Urine Drug Screen:      Component Value Date/Time   LABOPIA NONE DETECTED 01/15/2020 1813   COCAINSCRNUR NONE DETECTED 01/15/2020 1813   LABBENZ NONE DETECTED 01/15/2020 1813   AMPHETMU NONE DETECTED 01/15/2020 1813   THCU NONE DETECTED 01/15/2020 1813   LABBARB NONE DETECTED 01/15/2020 1813    Alcohol Level: No results for input(s): ETH in the last 168 hours.  Other results: EKG: normal EKG, normal sinus rhythm, unchanged from previous tracings.  Imaging: CT Angio Head W or Wo Contrast  Result Date: 01/15/2020 CLINICAL DATA:  Sudden onset headache and dizziness, history of aneurysm EXAM: CT ANGIOGRAPHY HEAD TECHNIQUE: Multidetector CT imaging of the head was performed using the standard protocol during bolus administration of intravenous contrast. Multiplanar CT image reconstructions and MIPs were obtained to evaluate the vascular anatomy. CONTRAST:  39mL OMNIPAQUE IOHEXOL 350 MG/ML SOLN COMPARISON:  None. FINDINGS: CT HEAD Brain: There is no acute intracranial hemorrhage, mass effect, or edema. Gray-white differentiation is preserved. Patchy and confluent areas of hypoattenuation in the supratentorial white matter nonspecific but may reflect advanced chronic microvascular ischemic changes. Chronic right frontal infarct. There is left cerebellar encephalomalacia underlying craniotomy. There is no extra-axial fluid collection. Prominence of the ventricles and sulci reflects minor generalized parenchymal volume loss. Vascular: There is atherosclerotic calcification at the  skull base. Skull: Small left lateral suboccipital craniotomy. Sinuses/Orbits: No acute finding. Other: None. CTA HEAD Anterior circulation: Intracranial internal carotid arteries are patent with calcified plaque causing mild stenosis on the right and up to moderate stenosis on the left. Anterior and middle cerebral arteries are patent. Posterior circulation: Intracranial vertebral arteries are patent. There is calcified and noncalcified plaque causing mild to moderate stenosis. Basilar artery is patent. Posterior cerebral arteries are patent. Venous sinuses: Not well evaluated. IMPRESSION: No acute intracranial abnormality.  Chronic findings detailed above. No aneurysm identified. Electronically Signed   By: Guadlupe Spanish M.D.   On: 01/15/2020 12:14   MR Brain Wo Contrast (neuro protocol)  Result Date: 01/15/2020 CLINICAL DATA:  64 year old male with sudden onset headache and dizziness. History of left suboccipital craniotomy. EXAM: MRI HEAD WITHOUT CONTRAST TECHNIQUE: Multiplanar, multiecho pulse sequences of the brain and surrounding structures were obtained without intravenous contrast. COMPARISON:  CTA head and neck earlier today.  Head CT 06/23/2013. FINDINGS: Brain: There are several small linear foci of restricted diffusion in the right cerebellar hemisphere (series 5). Associated T2 and FLAIR hyperintensity with no definite acute hemorrhage and no mass effect. Furthermore there are 4-5 similar small  foci of restricted diffusion in both cerebral hemispheres, more numerous on the right and including right basal ganglia and mesial temporal lobe involvement. No mass effect or definite acute hemorrhage. However, extensive chronic microhemorrhages scattered in the bilateral cerebellum, superimposed on more postoperative appearing hemosiderin and encephalomalacia in the left cerebellar hemisphere underlying the craniotomy. Chronic microhemorrhages are occasionally noted in the brainstem. Multiple similar  chronic microhemorrhages in the bilateral deep gray matter nuclei, and occasionally elsewhere in the cerebral hemispheres. There is a small area of cortical encephalomalacia with hemosiderin in the anterior superior right frontal gyrus. Superimposed widespread patchy bilateral cerebral white matter T2 and FLAIR hyperintensity, and T2 heterogeneity throughout the deep gray matter nuclei. No other cortical encephalomalacia. But there is also focal encephalomalacia of the body of the corpus callosum. No midline shift, mass effect, evidence of mass lesion, ventriculomegaly, extra-axial collection or acute intracranial hemorrhage. Cervicomedullary junction and pituitary are within normal limits. Vascular: Major intracranial vascular flow voids are preserved, with generalized intracranial artery tortuosity. Skull and upper cervical spine: Negative visible cervical spine and bone marrow signal. Left suboccipital craniotomy better demonstrated by CT. Sinuses/Orbits: Negative orbits. Mild paranasal sinus mucosal thickening. Other: Visible internal auditory structures appear normal. Mastoids are clear. Mild left suboccipital scalp soft tissue scarring. IMPRESSION: 1. Positive for approximately 9 small foci of acute ischemia scattered in both cerebral hemispheres and the right cerebellar hemisphere. No acute hemorrhage or mass effect. This pattern is most suspicious for recent embolic event from the heart or proximal aorta, although synchronous small vessel disease may also be possible (see #2). 2. Numerous chronic micro-hemorrhages in the cerebellum, also the brainstem, the deep gray nuclei. Comparatively little involvement of the cerebral hemispheres, arguing against amyloid angiopathy. Small chronic cortical infarct in the right superior frontal gyrus with hemosiderin. Extensive bilateral white matter and deep gray nuclei signal heterogeneity, and focal encephalomalacia in the body of the corpus callosum. 3. Superimposed  chronic postoperative changes to the left cerebellar hemisphere with encephalomalacia. Electronically Signed   By: Odessa FlemingH  Hall M.D.   On: 01/15/2020 20:57   [facility] and [facility])  Result Date: 01/16/2020 CLINICAL DATA:  CVA.  History CAD and hypertension. EXAM: BILATERAL CAROTID DUPLEX ULTRASOUND TECHNIQUE: Wallace CullensGray scale imaging, color Doppler and duplex ultrasound were performed of bilateral carotid and vertebral arteries in the neck. COMPARISON:  None. FINDINGS: Criteria: Quantification of carotid stenosis is based on velocity parameters that correlate the residual internal carotid diameter with NASCET-based stenosis levels, using the diameter of the distal internal carotid lumen as the denominator for stenosis measurement. The following velocity measurements were obtained: RIGHT ICA: 60/19 cm/sec CCA: 57/18 cm/sec SYSTOLIC ICA/CCA RATIO:  1.1 ECA: 70 cm/sec LEFT ICA: 65/22 cm/sec CCA: 70/20 cm/sec SYSTOLIC ICA/CCA RATIO:  1.0 ECA: 57 cm/sec RIGHT CAROTID ARTERY: There is a minimal amount of circumferential intimal thickening throughout the right common carotid artery (images 3, 7 and 11). There is a minimal amount of eccentric mixed echogenic plaque within the right carotid bulb (image 17), extending to involve the origin and proximal aspects of the right internal carotid artery (image 23), not resulting in elevated peak systolic velocities within the interrogated course of the right internal carotid artery to suggest a hemodynamically significant stenosis. RIGHT VERTEBRAL ARTERY:  Antegrade flow LEFT CAROTID ARTERY: There is a minimal amount of atherosclerotic plaque involving the proximal aspects of the left internal carotid artery (image 35). There is a minimal amount of intimal thickening involving the mid (image 39)  and distal (image 43) aspects of the left common carotid artery. There is a minimal amount of intimal thickening involving the left carotid bulb, not resulting in elevated peak  systolic velocities within the interrogated course of the left internal carotid artery to suggest a hemodynamically significant stenosis. LEFT VERTEBRAL ARTERY:  Antegrade flow IMPRESSION: Minimal amount of bilateral atherosclerotic plaque, not resulting in a hemodynamically significant stenosis within either internal carotid artery. Electronically Signed   By: Sandi Mariscal M.D.   On: 01/16/2020 09:03   US Venous Img Lower Bilateral (DVT)  Result Date: 01/16/2020 CLINICAL DATA:  Acute ischemia involving multiple vascular territories. EXAM: BILATERAL LOWER EXTREMITY VENOUS DOPPLER ULTRASOUND TECHNIQUE: Gray-scale sonography with graded compression, as well as color Doppler and duplex ultrasound were performed to evaluate the lower extremity deep venous systems from the level of the common femoral vein and including the common femoral, femoral, profunda femoral, popliteal and calf veins including the posterior tibial, peroneal and gastrocnemius veins when visible. The superficial great saphenous vein was also interrogated. Spectral Doppler was utilized to evaluate flow at rest and with distal augmentation maneuvers in the common femoral, femoral and popliteal veins. COMPARISON:  None. FINDINGS: RIGHT LOWER EXTREMITY Common Femoral Vein: No evidence of thrombus. Normal compressibility, respiratory phasicity and response to augmentation. Saphenofemoral Junction: No evidence of thrombus. Normal compressibility and flow on color Doppler imaging. Profunda Femoral Vein: No evidence of thrombus. Normal compressibility and flow on color Doppler imaging. Femoral Vein: While the proximal and mid aspects of the femoral vein are patent, there is occlusive thrombus noted within one of the duplicated distal aspects of the right femoral vein (images 7, 9 and 28). Popliteal Vein: No evidence of thrombus. Normal compressibility, respiratory phasicity and response to augmentation. Calf Veins: No evidence of thrombus. Normal  compressibility and flow on color Doppler imaging. Superficial Great Saphenous Vein: No evidence of thrombus. Normal compressibility. Other Findings:  None. LEFT LOWER EXTREMITY Common Femoral Vein: No evidence of thrombus. Normal compressibility, respiratory phasicity and response to augmentation. Saphenofemoral Junction: No evidence of thrombus. Normal compressibility and flow on color Doppler imaging. Profunda Femoral Vein: There is non-occlusive wall thickening/chronic DVT involving the imaged proximal aspect of the left deep femoral vein (images 42 and 47). Femoral Vein: No evidence of thrombus. Normal compressibility, respiratory phasicity and response to augmentation. Popliteal Vein: There is non-occlusive wall thickening/chronic DVT involving the distal aspect of the left popliteal vein (images 62 and 63). Calf Veins: No evidence of thrombus. Normal compressibility and flow on color Doppler imaging. Superficial Great Saphenous Vein: No evidence of thrombus. Normal compressibility. Venous Reflux:  None. Other Findings:  None. IMPRESSION: 1. Age indeterminate occlusive thrombus within the distal aspect of the partially duplicated right femoral vein - while potentially chronic in etiology, in the absence of previous examinations, an acute on chronic process is not excluded. 2. Non-occlusive wall thickening/chronic DVT involving the left deep femoral and popliteal veins. Electronically Signed   By: Sandi Mariscal M.D.   On: 01/16/2020 08:16     Assessment/Plan:  64 y.o. male  with PMH of SVT, PVD, acute ischemic multifocal multiple vascular territories stroke, brain aneurysm s/p clipping in [place], CAD, GAD, CKD stage III, HTN, and dizziness. Was sitting outside when he felt dizzy, gait became unsteady, symptoms lasted 3 hours, and he came to the ED. On MRI patient has bilateral cerebellar strokes along with chronic microhemorrahges. Now improved and back to baseline.   - Strokes do appear to be embolic. He is  currently back to baselin - No anti platelet therapy at home - Currently on ASA 325 daily - s/p 2decho with bubble and on holter - if 2decho no abnormalities can get TEE either as in patient or out patent along with prolonged monitor/loop recorder.   Addendum: b/L LE DVT - Needs anticoagulation which will can be started in about 3 days.  - TEE in AM  - d/c planning tomorrow 01/16/2020, 11:21 AM

## 2020-01-16 NOTE — Progress Notes (Signed)
PROGRESS NOTE    HAMZEH TALL  NTZ:[id number] DOB: 05/17/1956 DOA: 01/15/2020 PCP: [name], NP   Brief Narrative:  Howard Weaver is a 64 y.o. male with medical history significant for CAD, SVT, hypertension, CKD stage III, PVD, history of brain aneurysm s/p clipping in [place], and GAD who presents to the ED for evaluation of dizziness.   MRI brain showing approximately 9 small foci of acute ischemia scattered in both cerebral hemispheres in the right cerebellar hemisphere.  No acute hemorrhage or mass-effect seen.  Concern for embolic etiology.  Numerous chronic microhemorrhages also noted.  Admitted for further work-up. Echocardiogram with bubble studies was negative for any PFO, CTA head without any significant stenosis.  Ultrasound carotid was without any negative stenosis.  Bilateral lower extremity venous Doppler was positive for bilateral DVTs which seems chronic.  Subjective: Patient has no new complaints.  He was feeling back to his baseline.  He was accompanied by his son in the room.  Assessment & Plan:   Principal Problem:   Acute ischemic multifocal multiple vascular territories stroke [facility]) Active Problems:   CAD (coronary artery disease)   GAD (generalized anxiety disorder)   CKD (chronic kidney disease), stage III   Essential hypertension   Dizziness   Elevated troponin  Multifocal acute ischemic CVA: There is concern for embolic phenomenon.  Echocardiogram with bubble studies were negative for any PFO.  US carotid and CTA head was without any significant stenosis.  Venous Doppler lower extremity studies with bilateral DVTs which seems chronic. There is concern of antiphospholipid antibody syndrome. PT and OT are recommending outpatient therapy. -Check ANA, ESR, CRP, anticardiolipin antibodies and lupus anticoagulant. -Cardiology was consulted and they will do TEE tomorrow. -He will also need long-term cardiac monitor to rule out any  arrhythmia. -Continue with aspirin and statin. -Patient will need to be on anticoagulation, after discussing with neurology he should be started on Eliquis after 3 days.  CAD with elevated troponin: Pain.  Troponin with a flat curve now, most likely secondary to demand.  Echocardiogram without any wall motion abnormalities. -Continue aspirin and statin. -Restart Toprol.  It was held on admission due to permissive hypertension.  Hypertension.  Patient was on chlorthalidone, Imdur and Toprol which were held on admission for permissive hypertension. -ReStart Toprol. -Continue holding Imdur and chlorthalidone for another day. -IV labetalol as needed.  CKD stage III: Chronic and appears stable.  Continue to monitor.  Hyperlipidemia: Not currently on statin therapy. -He was started on Crestor 20 mg daily.  Objective: Vitals:   01/16/20 0621 01/16/20 0825 01/16/20 1300 01/16/20 1401  BP: (!) 177/116 (!) 184/98 (!) 206/122 (!) 216/123  Pulse: 61 72 78   Resp: 20 15 (!) 21   Temp: 98 F (36.7 C) 98.3 F (36.8 C) 98.2 F (36.8 C)   TempSrc: Oral Oral    SpO2: 98% 99% 97%   Weight:      Height:        Intake/Output Summary (Last 24 hours) at 01/16/2020 1416 Last data filed at 01/16/2020 1001 Gross per 24 hour  Intake 1240 ml  Output 600 ml  Net 640 ml   Filed Weights   01/15/20 1056 01/15/20 2301  Weight: 100.7 kg 100.7 kg    Examination:  General exam: Appears calm and comfortable  Respiratory system: Clear to auscultation. Respiratory effort normal. Cardiovascular system: S1 & S2 heard, RRR. No JVD, murmurs, rubs, gallops or clicks. Gastrointestinal system: Soft, nontender, nondistended, bowel  sounds positive. Central nervous system: Alert and oriented. No focal neurological deficits.Symmetric 5 x 5 power. Extremities: No edema, no cyanosis, pulses intact and symmetrical. Skin: No rashes, lesions or ulcers Psychiatry: Judgement and insight appear normal. Mood & affect  appropriate.    DVT prophylaxis: Lovenox Code Status: DNR Family Communication: Son was updated at bedside. Disposition Plan:  Status is: Inpatient  Remains inpatient appropriate because:Ongoing diagnostic testing needed not appropriate for outpatient work up   Dispo: The patient is from: Home              Anticipated d/c is to: Home              Anticipated d/c date is: 1 day              Patient currently is not medically stable to d/c.  Patient needs further investigation for his embolic stroke.  Going to have TEE tomorrow.  Should be able to go home afterwards.  Consultants:   Cardiology  Neurology  Procedures:  Antimicrobials:   Data Reviewed: I have personally reviewed following labs and imaging studies  CBC: Recent Labs  Lab 01/15/20 1059 01/15/20 2258  WBC 7.5 8.0  HGB 14.6 14.3  HCT 42.9 42.7  MCV 87.7 88.4  PLT 245 127   Basic Metabolic Panel: Recent Labs  Lab 01/15/20 1059 01/15/20 2258  NA 136 138  K 3.8 3.6  CL 101 103  CO2 27 26  GLUCOSE 180* 114*  BUN 21 21  CREATININE 1.38* 1.28*  CALCIUM 9.5 9.2   GFR: Estimated Creatinine Clearance: 67 mL/min (A) (by C-G formula based on SCr of 1.28 mg/dL (H)). Liver Function Tests: No results for input(s): AST, ALT, ALKPHOS, BILITOT, PROT, ALBUMIN in the last 168 hours. No results for input(s): LIPASE, AMYLASE in the last 168 hours. No results for input(s): AMMONIA in the last 168 hours. Coagulation Profile: No results for input(s): INR, PROTIME in the last 168 hours. Cardiac Enzymes: No results for input(s): CKTOTAL, CKMB, CKMBINDEX, TROPONINI in the last 168 hours. BNP (last 3 results) No results for input(s): PROBNP in the last 8760 hours. HbA1C: No results for input(s): HGBA1C in the last 72 hours. CBG: No results for input(s): GLUCAP in the last 168 hours. Lipid Profile: Recent Labs    01/16/20 0055  CHOL 148  HDL 36*  LDLCALC 92  TRIG 99  CHOLHDL 4.1   Thyroid Function Tests: No  results for input(s): TSH, T4TOTAL, FREET4, T3FREE, THYROIDAB in the last 72 hours. Anemia Panel: No results for input(s): VITAMINB12, FOLATE, FERRITIN, TIBC, IRON, RETICCTPCT in the last 72 hours. Sepsis Labs: No results for input(s): PROCALCITON, LATICACIDVEN in the last 168 hours.  Recent Results (from the past 240 hour(s))  SARS Coronavirus 2 by RT PCR (hospital order, performed in [facility] hospital lab) Nasopharyngeal Nasopharyngeal Swab     Status: None   Collection Time: 01/15/20  6:55 PM   Specimen: Nasopharyngeal Swab  Result Value Ref Range Status   SARS Coronavirus 2 NEGATIVE NEGATIVE Final    Comment: (NOTE) SARS-CoV-2 target nucleic acids are NOT DETECTED. The SARS-CoV-2 RNA is generally detectable in upper and lower respiratory specimens during the acute phase of infection. The lowest concentration of SARS-CoV-2 viral copies this assay can detect is 250 copies / mL. A negative result does not preclude SARS-CoV-2 infection and should not be used as the sole basis for treatment or other patient management decisions.  A negative result may occur with improper specimen  collection / handling, submission of specimen other than nasopharyngeal swab, presence of viral mutation(s) within the areas targeted by this assay, and inadequate number of viral copies (<250 copies / mL). A negative result must be combined with clinical observations, patient history, and epidemiological information. Fact Sheet for Patients:   StrictlyIdeas.no Fact Sheet for Healthcare Providers: BankingDealers.co.za This test is not yet approved or cleared  by the [place] FDA and has been authorized for detection and/or diagnosis of SARS-CoV-2 by FDA under an Emergency Use Authorization (EUA).  This EUA will remain in effect (meaning this test can be used) for the duration of the COVID-19 declaration under Section 564(b)(1) of the Act, 21  U.S.C. section 360bbb-3(b)(1), unless the authorization is terminated or revoked sooner. Performed at [facility], [address] [place], [place] [address]      Radiology Studies: CT Angio Head W or Wo Contrast  Result Date: 01/15/2020 CLINICAL DATA:  Sudden onset headache and dizziness, history of aneurysm EXAM: CT ANGIOGRAPHY HEAD TECHNIQUE: Multidetector CT imaging of the head was performed using the standard protocol during bolus administration of intravenous contrast. Multiplanar CT image reconstructions and MIPs were obtained to evaluate the vascular anatomy. CONTRAST:  3m OMNIPAQUE IOHEXOL 350 MG/ML SOLN COMPARISON:  None. FINDINGS: CT HEAD Brain: There is no acute intracranial hemorrhage, mass effect, or edema. Gray-white differentiation is preserved. Patchy and confluent areas of hypoattenuation in the supratentorial white matter nonspecific but may reflect advanced chronic microvascular ischemic changes. Chronic right frontal infarct. There is left cerebellar encephalomalacia underlying craniotomy. There is no extra-axial fluid collection. Prominence of the ventricles and sulci reflects minor generalized parenchymal volume loss. Vascular: There is atherosclerotic calcification at the skull base. Skull: Small left lateral suboccipital craniotomy. Sinuses/Orbits: No acute finding. Other: None. CTA HEAD Anterior circulation: Intracranial internal carotid arteries are patent with calcified plaque causing mild stenosis on the right and up to moderate stenosis on the left. Anterior and middle cerebral arteries are patent. Posterior circulation: Intracranial vertebral arteries are patent. There is calcified and noncalcified plaque causing mild to moderate stenosis. Basilar artery is patent. Posterior cerebral arteries are patent. Venous sinuses: Not well evaluated. IMPRESSION: No acute intracranial abnormality.  Chronic findings detailed above. No aneurysm identified. Electronically  Signed   By: PMacy MisM.D.   On: 01/15/2020 12:14   MR Brain Wo Contrast (neuro protocol)  Result Date: 01/15/2020 CLINICAL DATA:  64year old male with sudden onset headache and dizziness. History of left suboccipital craniotomy. EXAM: MRI HEAD WITHOUT CONTRAST TECHNIQUE: Multiplanar, multiecho pulse sequences of the brain and surrounding structures were obtained without intravenous contrast. COMPARISON:  CTA head and neck earlier today.  Head CT 06/23/2013. FINDINGS: Brain: There are several small linear foci of restricted diffusion in the right cerebellar hemisphere (series 5). Associated T2 and FLAIR hyperintensity with no definite acute hemorrhage and no mass effect. Furthermore there are 4-5 similar small foci of restricted diffusion in both cerebral hemispheres, more numerous on the right and including right basal ganglia and mesial temporal lobe involvement. No mass effect or definite acute hemorrhage. However, extensive chronic microhemorrhages scattered in the bilateral cerebellum, superimposed on more postoperative appearing hemosiderin and encephalomalacia in the left cerebellar hemisphere underlying the craniotomy. Chronic microhemorrhages are occasionally noted in the brainstem. Multiple similar chronic microhemorrhages in the bilateral deep gray matter nuclei, and occasionally elsewhere in the cerebral hemispheres. There is a small area of cortical encephalomalacia with hemosiderin in the anterior superior right frontal gyrus. Superimposed widespread patchy bilateral  cerebral white matter T2 and FLAIR hyperintensity, and T2 heterogeneity throughout the deep gray matter nuclei. No other cortical encephalomalacia. But there is also focal encephalomalacia of the body of the corpus callosum. No midline shift, mass effect, evidence of mass lesion, ventriculomegaly, extra-axial collection or acute intracranial hemorrhage. Cervicomedullary junction and pituitary are within normal limits. Vascular:  Major intracranial vascular flow voids are preserved, with generalized intracranial artery tortuosity. Skull and upper cervical spine: Negative visible cervical spine and bone marrow signal. Left suboccipital craniotomy better demonstrated by CT. Sinuses/Orbits: Negative orbits. Mild paranasal sinus mucosal thickening. Other: Visible internal auditory structures appear normal. Mastoids are clear. Mild left suboccipital scalp soft tissue scarring. IMPRESSION: 1. Positive for approximately 9 small foci of acute ischemia scattered in both cerebral hemispheres and the right cerebellar hemisphere. No acute hemorrhage or mass effect. This pattern is most suspicious for recent embolic event from the heart or proximal aorta, although synchronous small vessel disease may also be possible (see #2). 2. Numerous chronic micro-hemorrhages in the cerebellum, also the brainstem, the deep gray nuclei. Comparatively little involvement of the cerebral hemispheres, arguing against amyloid angiopathy. Small chronic cortical infarct in the right superior frontal gyrus with hemosiderin. Extensive bilateral white matter and deep gray nuclei signal heterogeneity, and focal encephalomalacia in the body of the corpus callosum. 3. Superimposed chronic postoperative changes to the left cerebellar hemisphere with encephalomalacia. Electronically Signed   By: Genevie Ann M.D.   On: 01/15/2020 20:57   US Carotid Bilateral (at [facility] and [facility] only)  Result Date: 01/16/2020 CLINICAL DATA:  CVA.  History CAD and hypertension. EXAM: BILATERAL CAROTID DUPLEX ULTRASOUND TECHNIQUE: Pearline Cables scale imaging, color Doppler and duplex ultrasound were performed of bilateral carotid and vertebral arteries in the neck. COMPARISON:  None. FINDINGS: Criteria: Quantification of carotid stenosis is based on velocity parameters that correlate the residual internal carotid diameter with NASCET-based stenosis levels, using the diameter of the distal internal carotid lumen as  the denominator for stenosis measurement. The following velocity measurements were obtained: RIGHT ICA: 60/19 cm/sec CCA: 33/35 cm/sec SYSTOLIC ICA/CCA RATIO:  1.1 ECA: 70 cm/sec LEFT ICA: 65/22 cm/sec CCA: 45/62 cm/sec SYSTOLIC ICA/CCA RATIO:  1.0 ECA: 57 cm/sec RIGHT CAROTID ARTERY: There is a minimal amount of circumferential intimal thickening throughout the right common carotid artery (images 3, 7 and 11). There is a minimal amount of eccentric mixed echogenic plaque within the right carotid bulb (image 17), extending to involve the origin and proximal aspects of the right internal carotid artery (image 23), not resulting in elevated peak systolic velocities within the interrogated course of the right internal carotid artery to suggest a hemodynamically significant stenosis. RIGHT VERTEBRAL ARTERY:  Antegrade flow LEFT CAROTID ARTERY: There is a minimal amount of atherosclerotic plaque involving the proximal aspects of the left internal carotid artery (image 35). There is a minimal amount of intimal thickening involving the mid (image 39) and distal (image 43) aspects of the left common carotid artery. There is a minimal amount of intimal thickening involving the left carotid bulb, not resulting in elevated peak systolic velocities within the interrogated course of the left internal carotid artery to suggest a hemodynamically significant stenosis. LEFT VERTEBRAL ARTERY:  Antegrade flow IMPRESSION: Minimal amount of bilateral atherosclerotic plaque, not resulting in a hemodynamically significant stenosis within either internal carotid artery. Electronically Signed   By: Sandi Mariscal M.D.   On: 01/16/2020 09:03   US Venous Img Lower Bilateral (DVT)  Result Date: 01/16/2020 CLINICAL DATA:  Acute  ischemia involving multiple vascular territories. EXAM: BILATERAL LOWER EXTREMITY VENOUS DOPPLER ULTRASOUND TECHNIQUE: Gray-scale sonography with graded compression, as well as color Doppler and duplex ultrasound were  performed to evaluate the lower extremity deep venous systems from the level of the common femoral vein and including the common femoral, femoral, profunda femoral, popliteal and calf veins including the posterior tibial, peroneal and gastrocnemius veins when visible. The superficial great saphenous vein was also interrogated. Spectral Doppler was utilized to evaluate flow at rest and with distal augmentation maneuvers in the common femoral, femoral and popliteal veins. COMPARISON:  None. FINDINGS: RIGHT LOWER EXTREMITY Common Femoral Vein: No evidence of thrombus. Normal compressibility, respiratory phasicity and response to augmentation. Saphenofemoral Junction: No evidence of thrombus. Normal compressibility and flow on color Doppler imaging. Profunda Femoral Vein: No evidence of thrombus. Normal compressibility and flow on color Doppler imaging. Femoral Vein: While the proximal and mid aspects of the femoral vein are patent, there is occlusive thrombus noted within one of the duplicated distal aspects of the right femoral vein (images 7, 9 and 28). Popliteal Vein: No evidence of thrombus. Normal compressibility, respiratory phasicity and response to augmentation. Calf Veins: No evidence of thrombus. Normal compressibility and flow on color Doppler imaging. Superficial Great Saphenous Vein: No evidence of thrombus. Normal compressibility. Other Findings:  None. LEFT LOWER EXTREMITY Common Femoral Vein: No evidence of thrombus. Normal compressibility, respiratory phasicity and response to augmentation. Saphenofemoral Junction: No evidence of thrombus. Normal compressibility and flow on color Doppler imaging. Profunda Femoral Vein: There is non-occlusive wall thickening/chronic DVT involving the imaged proximal aspect of the left deep femoral vein (images 42 and 47). Femoral Vein: No evidence of thrombus. Normal compressibility, respiratory phasicity and response to augmentation. Popliteal Vein: There is  non-occlusive wall thickening/chronic DVT involving the distal aspect of the left popliteal vein (images 62 and 63). Calf Veins: No evidence of thrombus. Normal compressibility and flow on color Doppler imaging. Superficial Great Saphenous Vein: No evidence of thrombus. Normal compressibility. Venous Reflux:  None. Other Findings:  None. IMPRESSION: 1. Age indeterminate occlusive thrombus within the distal aspect of the partially duplicated right femoral vein - while potentially chronic in etiology, in the absence of previous examinations, an acute on chronic process is not excluded. 2. Non-occlusive wall thickening/chronic DVT involving the left deep femoral and popliteal veins. Electronically Signed   By: Sandi Mariscal M.D.   On: 01/16/2020 08:16   ECHOCARDIOGRAM COMPLETE BUBBLE STUDY  Result Date: 01/16/2020    ECHOCARDIOGRAM REPORT   Patient Name:   Howard Weaver [facility] Date of Exam: 01/16/2020 Medical Rec #:  [id number]         Height:       68.0 in Accession #:    [id number]        Weight:       222.0 lb Date of Birth:  07-May-1956         BSA:          2.136 m Patient Age:    37 years          BP:           184/98 mmHg Patient Gender: M                 HR:           72 bpm. Exam Location:  [facility] Procedure: 2D Echo, Saline Contrast Bubble Study, Cardiac Doppler and Color            Doppler Indications:  Stroke 434.91  History:         Patient has prior history of Echocardiogram examinations, most                  recent 01/22/2017. CAD, Signs/Symptoms:Chest Pain; Risk                  Factors:Hypertension. MI.  Sonographer:     Sherrie Sport RDCS (AE) Referring Phys:  [id number] Cleaster Corin PATEL Diagnosing Phys: Kate Sable MD IMPRESSIONS  1. Left ventricular ejection fraction, by estimation, is 55 to 60%. The left ventricle has normal function. The left ventricle has no regional wall motion abnormalities. There is mild left ventricular hypertrophy. Left ventricular diastolic parameters are consistent with Grade I  diastolic dysfunction (impaired relaxation).  2. Right ventricular systolic function is normal. The right ventricular size is normal.  3. Left atrial size was moderately dilated.  4. The mitral valve is grossly normal. No evidence of mitral valve regurgitation.  5. The aortic valve is tricuspid. Aortic valve regurgitation is trivial.  6. Agitated saline contrast bubble study was negative, with no evidence of any interatrial shunt. FINDINGS  Left Ventricle: Left ventricular ejection fraction, by estimation, is 55 to 60%. The left ventricle has normal function. The left ventricle has no regional wall motion abnormalities. The left ventricular internal cavity size was normal in size. There is  mild left ventricular hypertrophy. Left ventricular diastolic parameters are consistent with Grade I diastolic dysfunction (impaired relaxation). Right Ventricle: The right ventricular size is normal. No increase in right ventricular wall thickness. Right ventricular systolic function is normal. Left Atrium: Left atrial size was moderately dilated. Right Atrium: Right atrial size was normal in size. Pericardium: There is no evidence of pericardial effusion. Mitral Valve: The mitral valve is grossly normal. Mild to moderate mitral annular calcification. No evidence of mitral valve regurgitation. Tricuspid Valve: The tricuspid valve is grossly normal. Tricuspid valve regurgitation is not demonstrated. Aortic Valve: The aortic valve is tricuspid. Aortic valve regurgitation is trivial. Aortic regurgitation PHT measures 650 msec. Aortic valve mean gradient measures 5.0 mmHg. Aortic valve peak gradient measures 8.4 mmHg. Aortic valve area, by VTI measures  3.82 cm. Pulmonic Valve: The pulmonic valve was not well visualized. Pulmonic valve regurgitation is not visualized. Aorta: The aortic root is normal in size and structure. Venous: The inferior vena cava was not well visualized. IAS/Shunts: No atrial level shunt detected by color  flow Doppler. Agitated saline contrast was given intravenously to evaluate for intracardiac shunting. Agitated saline contrast bubble study was negative, with no evidence of any interatrial shunt.  LEFT VENTRICLE PLAX 2D LVIDd:         3.53 cm  Diastology LVIDs:         2.55 cm  LV e' lateral:   4.46 cm/s LV PW:         1.47 cm  LV E/e' lateral: 15.8 LV IVS:        1.96 cm  LV e' medial:    4.68 cm/s LVOT diam:     2.20 cm  LV E/e' medial:  15.0 LV SV:         102 LV SV Index:   48 LVOT Area:     3.80 cm  RIGHT VENTRICLE RV S prime:     16.00 cm/s TAPSE (M-mode): 4.2 cm LEFT ATRIUM              Index       RIGHT ATRIUM  Index LA diam:        4.20 cm  1.97 cm/m  RA Area:     15.20 cm LA Vol (A2C):   124.0 ml 58.04 ml/m RA Volume:   33.40 ml  15.63 ml/m LA Vol (A4C):   87.8 ml  41.10 ml/m LA Biplane Vol: 105.0 ml 49.15 ml/m  AORTIC VALVE                    PULMONIC VALVE AV Area (Vmax):    3.17 cm     PV Vmax:        0.76 m/s AV Area (Vmean):   3.02 cm     PV Peak grad:   2.3 mmHg AV Area (VTI):     3.82 cm     RVOT Peak grad: 2 mmHg AV Vmax:           145.00 cm/s AV Vmean:          104.000 cm/s AV VTI:            0.266 m AV Peak Grad:      8.4 mmHg AV Mean Grad:      5.0 mmHg LVOT Vmax:         121.00 cm/s LVOT Vmean:        82.700 cm/s LVOT VTI:          0.268 m LVOT/AV VTI ratio: 1.01 AI PHT:            650 msec  AORTA Ao Root diam: 3.40 cm MITRAL VALVE                TRICUSPID VALVE MV Area (PHT): 2.48 cm     TR Peak grad:   15.1 mmHg MV Decel Time: 306 msec     TR Vmax:        194.00 cm/s MV E velocity: 70.30 cm/s MV A velocity: 117.00 cm/s  SHUNTS MV E/A ratio:  0.60         Systemic VTI:  0.27 m                             Systemic Diam: 2.20 cm Kate Sable MD Electronically signed by Kate Sable MD Signature Date/Time: 01/16/2020/12:25:48 PM    Final     Scheduled Meds: . Derrill Memo ON 01/17/2020] aspirin EC  81 mg Oral Daily  . enoxaparin (LOVENOX) injection  40 mg Subcutaneous  Q24H  . rosuvastatin  20 mg Oral Daily  . sodium chloride flush  3 mL Intravenous Once   Continuous Infusions:   LOS: 1 day   Time spent: 45 minutes.  Lorella Nimrod, MD [facility]  If 7PM-7AM, please contact night-coverage [email or web address]  01/16/2020, 2:16 PM   This record has been created using Systems analyst. Errors have been sought and corrected,but may not always be located. Such creation errors do not reflect on the standard of care.

## 2020-01-16 NOTE — Progress Notes (Signed)
*  PRELIMINARY RESULTS* Echocardiogram 2D Echocardiogram has been performed.  Howard Weaver 01/16/2020, 9:46 AM

## 2020-01-16 NOTE — Progress Notes (Signed)
Patient refuses bed alarms at this time, patient educated about safety. 

## 2020-01-16 NOTE — Consult Note (Signed)
[facility] TeleNeurology Consult Services  Stat Consult  Date of Service:   01/15/2020 22:24:19  Impression:     .  I63.9 - Cerebrovascular accident (CVA), unspecified mechanism ([place])  Comments/Sign-Out: multiple vasc territory acute infarcts on MRI brain with subj dizziness but NIH SS currently 0. He has no known hx of afib/cardioembolic etiology; he requires further eval for this.  CT HEAD: Showed No Acute Hemorrhage or Acute Core Infarct CTA head: no aneurysm  MRI brain: 1. Positive for approximately 9 small foci of acute ischemia scattered in both cerebral hemispheres and the right cerebellar hemisphere. No acute hemorrhage or mass effect. This pattern is most suspicious for recent embolic event from the heart or proximal aorta, although synchronous small vessel disease may also be possible (see #2).   2. Numerous chronic micro-hemorrhages in the cerebellum, also the brainstem, the deep gray nuclei. Comparatively little involvement of the cerebral hemispheres, arguing against amyloid angiopathy. Small chronic cortical infarct in the right superior frontal gyrus with hemosiderin. Extensive bilateral white matter and deep gray nuclei signal heterogeneity, and focal encephalomalacia in the body of the corpus callosum.   3. Superimposed chronic postoperative changes to the left cerebellar hemisphere with encephalomalacia.    Metrics: [facility] Notification Time: 01/15/2020 22:21:53 Stamp Time: 01/15/2020 22:24:19 Callback Response Time: 01/15/2020 22:26:00  Our recommendations are outlined below.  Recommendations:     .  Initiate Aspirin 81 MG Daily     .  telemetry, TTEcho with bubble study, if there is PFO, check BLE ultrasound for dvt-- if both telemetry and TTE negative, consider TEE and holter monitor and hypercoag testing, vasculitis testing with labs (esr, crp, SSA/SSB Ab, RF, ANA, ANCA)     .  check flp, hga1c     .  dvt prophylaxis  by primary team    Disposition: Neurology Follow Up Recommended  Discussed with RN Kennyth Lose to call hospitalist to review my recs and initiate orders  ----------------------------------------------------------------------------------------------------  Chief Complaint: dizziness, nausea, vomiting, diaphoresis with abnormal MRI brain  History of Present Illness: Patient is a 64 year old Male.  with hx of cerebral aneurysm clipping presents to ED for dizziness, nausea, vomiting, diaphoresis with abnormal MRI brain. STAT consult requested. He confirms above sxs but clarifies dizziness was "feeling like I was going to pass out" and last known well was 9:00. He denies vertigo, vision change, focal weakness/numbness, speech change. He noted slight balance issues.    Anticoagulant use:  No  Antiplatelet use: No     Examination: BP(175/118), 1A: Level of Consciousness - Alert; keenly responsive + 0 1B: Ask Month and Age - Both Questions Right + 0 1C: Blink Eyes & Squeeze Hands - Performs Both Tasks + 0 2: Test Horizontal Extraocular Movements - Normal + 0 3: Test Visual Fields - No Visual Loss + 0 4: Test Facial Palsy (Use Grimace if Obtunded) - Normal symmetry + 0 5A: Test Left Arm Motor Drift - No Drift for 10 Seconds + 0 5B: Test Right Arm Motor Drift - No Drift for 10 Seconds + 0 6A: Test Left Leg Motor Drift - No Drift for 5 Seconds + 0 6B: Test Right Leg Motor Drift - No Drift for 5 Seconds + 0 7: Test Limb Ataxia (FNF/Heel-Shin) - No Ataxia + 0 8: Test Sensation - Normal; No sensory loss + 0 9: Test Language/Aphasia - Normal; No aphasia + 0 10: Test Dysarthria - Normal + 0 11: Test Extinction/Inattention - No abnormality + 0  NIHSS  Score: 0   Patient/Family was informed the Neurology Consult would occur via TeleHealth consult by way of interactive audio and video telecommunications and consented to receiving care in this manner.  Patient is being evaluated for possible  acute neurologic impairment and high probability of imminent or life-threatening deterioration. I spent total of 15 minutes providing care to this patient, including time for face to face visit via telemedicine, review of medical records, imaging studies and discussion of findings with providers, the patient and/or family.   Dr Lillia Carmel   [facility] [phone]  Case [id number]

## 2020-01-16 NOTE — Evaluation (Signed)
Physical Therapy Evaluation Patient Details Name: Howard Weaver MRN: [id number] DOB: 1956/05/23 Today's Date: 01/16/2020   History of Present Illness  Patient is a 64 year old male with PMH of SVT, PVDacute ischemic multifocal multiple vascular territories stroke, brain aneurysm s/p clipping in [place], CAD, GAD, CKD stage III, HTN, and dizziness. Was sitting outside when he felt dizzy, gait became unsteady, symptoms lasted 3 hours, and he came to the ED. Patient reports he walks unteady but doesn't use an AD at baseline at home.  Clinical Impression  Patient is a pleasant 64 year old male who presents with slight balance deficit. Patient is in bed upon PT arrival and eager to participate in physical therapy session. Patient is able to position self on EOB independently without use of UE's. He independently stands and reaches for his pants and shoes sits back down and is able to don shoes and tie them independently. Ambulation is slightly unsteady with patient requiring cueing for calm breathing technique due to initial fear since it was his first time walking since admission. Patient became more steady with prolonged ambulation with patient reporting his walking is now at his baseline. Patient returned to recliner with chair alarm on, nursing notified. Patient interested in outpatient PT for balance training. Patient would benefit from skilled physical therapy to increase mobility, safety with stair negotiation, and stability. Upon discharge patient would benefit from outpatient physical therapy due to patient being back at baseline.     Follow Up Recommendations Outpatient PT    Equipment Recommendations  None recommended by PT    Recommendations for Other Services       Precautions / Restrictions Precautions Precautions: None Precaution Comments: watch HR Restrictions Weight Bearing Restrictions: No      Mobility  Bed Mobility Overal bed mobility: Independent             General  bed mobility comments: supine to sit with no assistance, does not require use of UE's  Transfers Overall transfer level: Independent               General transfer comment: sit to stand without UE support, without AD, able to stabilize self and pull on pants.  Ambulation/Gait Ambulation/Gait assistance: Min guard Gait Distance (Feet): 180 Feet     Gait velocity: decreased   General Gait Details: Patient has short steps with lateral sway, per patient this is his baseline of ambulation. no AD required,  no falls or LOB.  Stairs            Wheelchair Mobility    Modified Rankin (Stroke Patients Only)       Balance Overall balance assessment: Mild deficits observed, not formally tested;Needs assistance Sitting-balance support: Feet unsupported Sitting balance-Leahy Scale: Normal Sitting balance - Comments: able to don shoes and tie shoes without LOB   Standing balance support: No upper extremity supported;During functional activity Standing balance-Leahy Scale: Fair Standing balance comment: able to pull on pants in standing, bend over and reach for shoes, no LOB, slight lateral sway with ambulation               High Level Balance Comments: limited single limb stance             Pertinent Vitals/Pain Pain Assessment: No/denies pain    Home Living Family/patient expects to be discharged to:: Private residence Living Arrangements: Non-relatives/Friends Available Help at Discharge: Family Type of Home: House Home Access: Level entry     Home Layout: One level  Home Equipment: Gilmer Mor - single point Additional Comments: Patient reports he has a cane he used from his previous aneurysm but doesn't use now.    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 Does need time to get around since he sometimes feels unsteady.     Hand  Dominance        Extremity/Trunk Assessment   Upper Extremity Assessment Upper Extremity Assessment: Defer to OT evaluation    Lower Extremity Assessment Lower Extremity Assessment: Overall WFL for tasks assessed(grossly 4/5 strength, ankle 4-/5)       Communication   Communication: No difficulties  Cognition Arousal/Alertness: Awake/alert Behavior During Therapy: WFL for tasks assessed/performed Overall Cognitive Status: Within Functional Limits for tasks assessed                                 General Comments: eager to participate with PT, oriented to self, situation, and prior level.      General Comments General comments (skin integrity, edema, etc.): patient appears well groomed and nourished.    Exercises Other Exercises Other Exercises: Patient educated on safe mobility and transfers, cues for breathing and keeping self calm for maintaining vitals in therapeutic range.   Assessment/Plan    PT Assessment Patient needs continued PT services  PT Problem List Decreased strength;Decreased activity tolerance;Decreased balance;Decreased mobility;Cardiopulmonary status limiting activity       PT Treatment Interventions DME instruction;Gait training;Stair training;Functional mobility training;Therapeutic activities;Therapeutic exercise;Balance training;Neuromuscular re-education;Patient/family education;Manual techniques    PT Goals (Current goals can be found in the Care Plan section)  Acute Rehab PT Goals Patient Stated Goal: patient wants to return home PT Goal Formulation: With patient Time For Goal Achievement: 01/30/20 Potential to Achieve Goals: Good    Frequency Min 2X/week   Barriers to discharge   would benefit from outpatient PT for balance    Co-evaluation               AM-PAC PT "6 Clicks" Mobility  Outcome Measure Help needed turning from your back to your side while in a flat bed without using bedrails?: None Help needed  moving from lying on your back to sitting on the side of a flat bed without using bedrails?: None Help needed moving to and from a bed to a chair (including a wheelchair)?: None Help needed standing up from a chair using your arms (e.g., wheelchair or bedside chair)?: None Help needed to walk in hospital room?: A Little Help needed climbing 3-5 steps with a railing? : A Little 6 Click Score: 22    End of Session Equipment Utilized During Treatment: Gait belt Activity Tolerance: Patient tolerated treatment well Patient left: in chair;with call bell/phone within reach;with chair alarm set Nurse Communication: Mobility status PT Visit Diagnosis: Unsteadiness on feet (R26.81);Other abnormalities of gait and mobility (R26.89);Muscle weakness (generalized) (M62.81)    Time: 0932-3557 PT Time Calculation (min) (ACUTE ONLY): 25 min   Charges:   PT Evaluation $PT Eval Moderate Complexity: 1 Mod PT Treatments $Therapeutic Activity: 8-22 mins      Precious Bard, PT, DPT   01/16/2020, 9:04 AM

## 2020-01-17 ENCOUNTER — Inpatient Hospital Stay
Admit: 2020-01-17 | Discharge: 2020-01-17 | Disposition: A | Discharge status: Home / Self Care | Payer: Medicare Other | Attending: Cardiovascular Disease | Admitting: Cardiovascular Disease

## 2020-01-17 ENCOUNTER — Encounter
Admission: EM | Disposition: A | Discharge status: Home / Self Care | Payer: Self-pay | Source: Home / Self Care | Attending: Internal Medicine

## 2020-01-17 HISTORY — PX: TEE WITHOUT CARDIOVERSION: SHX5443

## 2020-01-17 LAB — ANA W/REFLEX IF POSITIVE: Anti Nuclear Antibody (ANA): NEGATIVE

## 2020-01-17 LAB — BETA-2-GLYCOPROTEIN I ABS, IGG/M/A
Beta-2 Glyco I IgG: <9 GPI IgG units (ref 0–20)
Beta-2-Glycoprotein I IgA: 53 GPI IgA units — ABNORMAL HIGH (ref 0–25)
Beta-2-Glycoprotein I IgM: <9 GPI IgM units (ref 0–32)

## 2020-01-17 LAB — LUPUS ANTICOAGULANT PANEL
DRVVT: 27.4 s (ref 0.0–47.0)
PTT Lupus Anticoagulant: 32.6 s (ref 0.0–51.9)

## 2020-01-17 SURGERY — ECHOCARDIOGRAM, TRANSESOPHAGEAL
Anesthesia: Moderate Sedation | Laterality: Right

## 2020-01-17 MED ORDER — FENTANYL CITRATE (PF) 100 MCG/2ML IJ SOLN
INTRAMUSCULAR | Status: AC | PRN
  Administered 2020-01-17: 50 ug via INTRAVENOUS

## 2020-01-17 MED ORDER — ROSUVASTATIN CALCIUM 20 MG PO TABS: 20.0000 mg | ORAL_TABLET | Freq: Every day | ORAL | 1 refills | Status: DC

## 2020-01-17 MED ORDER — ENALAPRIL MALEATE 20 MG PO TABS: 20.0000 mg | ORAL_TABLET | Freq: Every day | ORAL | 1 refills | Status: DC

## 2020-01-17 MED ORDER — CLONIDINE HCL 0.3 MG PO TABS: 0.3000 mg | ORAL_TABLET | Freq: Two times a day (BID) | ORAL | 11 refills | Status: DC

## 2020-01-17 MED ORDER — SODIUM CHLORIDE 0.9 % IV SOLN: INTRAVENOUS | Status: DC

## 2020-01-17 MED ORDER — CLONIDINE HCL 0.1 MG PO TABS
0.3000 mg | ORAL_TABLET | Freq: Two times a day (BID) | ORAL | Status: DC
  Administered 2020-01-17: 0.3 mg via ORAL
  Filled 2020-01-17: qty 3

## 2020-01-17 MED ORDER — LABETALOL HCL 5 MG/ML IV SOLN
INTRAVENOUS | Status: AC
  Filled 2020-01-17: qty 4

## 2020-01-17 MED ORDER — BUTAMBEN-TETRACAINE-BENZOCAINE 2-2-14 % EX AERO
INHALATION_SPRAY | CUTANEOUS | Status: AC
  Filled 2020-01-17: qty 5

## 2020-01-17 MED ORDER — ASPIRIN 81 MG PO TBEC: 81.0000 mg | DELAYED_RELEASE_TABLET | Freq: Every day | ORAL | 1 refills | Status: DC

## 2020-01-17 MED ORDER — SODIUM CHLORIDE FLUSH 0.9 % IV SOLN
INTRAVENOUS | Status: AC
  Filled 2020-01-17: qty 10

## 2020-01-17 MED ORDER — FENTANYL CITRATE (PF) 100 MCG/2ML IJ SOLN
INTRAMUSCULAR | Status: AC
  Filled 2020-01-17: qty 2

## 2020-01-17 MED ORDER — MIDAZOLAM HCL 5 MG/5ML IJ SOLN
INTRAMUSCULAR | Status: AC
  Filled 2020-01-17: qty 5

## 2020-01-17 MED ORDER — MIDAZOLAM HCL 2 MG/2ML IJ SOLN
INTRAMUSCULAR | Status: AC | PRN
  Administered 2020-01-17: 2 mg via INTRAVENOUS

## 2020-01-17 MED ORDER — CHLORTHALIDONE 25 MG PO TABS
25.0000 mg | ORAL_TABLET | Freq: Every day | ORAL | Status: DC
  Administered 2020-01-17: 10:00:00 25 mg via ORAL
  Filled 2020-01-17: qty 1

## 2020-01-17 MED ORDER — LIDOCAINE VISCOUS HCL 2 % MT SOLN
OROMUCOSAL | Status: AC
  Filled 2020-01-17: qty 15

## 2020-01-17 MED ORDER — APIXABAN 5 MG PO TABS: 5.0000 mg | ORAL_TABLET | Freq: Two times a day (BID) | ORAL | 1 refills | Status: DC

## 2020-01-17 MED ORDER — APIXABAN 5 MG PO TABS: 5.0000 mg | ORAL_TABLET | Freq: Two times a day (BID) | ORAL | Status: DC

## 2020-01-17 MED ORDER — ENALAPRIL MALEATE 20 MG PO TABS
20.0000 mg | ORAL_TABLET | Freq: Every day | ORAL | Status: DC
  Administered 2020-01-17: 10:00:00 20 mg via ORAL
  Filled 2020-01-17: qty 1

## 2020-01-17 NOTE — Progress Notes (Signed)
Pt MEWS Yellow s/t elevated BP. MD Notified. PRN Meds Administered. RN will continue to monitor   01/17/20 0800  Assess: MEWS Score  BP (!) 203/138  Pulse Rate 78  ECG Heart Rate 78  Resp 18  SpO2 99 %  Assess: MEWS Score  MEWS Temp 0  MEWS Systolic 2  MEWS Pulse 0  MEWS RR 0  MEWS LOC 0  MEWS Score 2  MEWS Score Color Yellow  Assess: if the MEWS score is Yellow or Red  Were vital signs taken at a resting state? Yes  Focused Assessment Documented focused assessment  Early Detection of Sepsis Score *See Row Information* Low  MEWS guidelines implemented *See Row Information* Yes  Treat  MEWS Interventions Administered prn meds/treatments  Take Vital Signs  Increase Vital Sign Frequency  Yellow: Q 2hr X 2 then Q 4hr X 2, if remains yellow, continue Q 4hrs  Escalate  MEWS: Escalate Yellow: discuss with charge nurse/RN and consider discussing with provider and RRT  Notify: Charge Nurse/RN  Name of Charge Nurse/RN Notified Debbie, RN  Date Charge Nurse/RN Notified 01/17/20  Time Charge Nurse/RN Notified 0810  Notify: Provider  Provider Name/Title Nelson Chimes, MD  Date Provider Notified 01/17/20  Time Provider Notified [phone]  Notification Type Face-to-face  Notification Reason Change in status  Response No new orders;Other (Comment) (Administer PRN Meds)  Date of Provider Response 01/17/20  Time of Provider Response [phone]

## 2020-01-17 NOTE — Progress Notes (Addendum)
Howard Weaver is a 64 y.o. male  [id number]  Primary Cardiologist: Neoma Laming Reason for Consultation: Bilateral stroke  HPI: This is a 64 year old white male with a history of coronary artery disease uncontrolled hypertension who presented to the hospital with bilateral stroke and has been noncompliant with his blood pressure medication as we have not seen since 2018.  I was asked to evaluate the patient for transesophageal echocardiogram.   Review of Systems: No chest pain or shortness of breath   Past Medical History:  Diagnosis Date  . CAD (coronary artery disease)   . Chest pain   . DU (duodenal ulcer)   . GAD (generalized anxiety disorder)   . Hypertension   . Myocardial infarction ([place])   . Radiculopathy of thoracolumbar region   . SVT (supraventricular tachycardia) ([place])   . Tachycardia     Medications Prior to Admission  Medication Sig Dispense Refill  . chlorthalidone (HYGROTON) 25 MG tablet Take 25 mg by mouth daily.    . isosorbide mononitrate (IMDUR) 30 MG 24 hr tablet Take 30 mg by mouth daily.    . metoprolol succinate (TOPROL-XL) 100 MG 24 hr tablet Take 1 tablet (100 mg total) by mouth daily. 90 tablet 2     . aspirin EC  81 mg Oral Daily  . butamben-tetracaine-benzocaine      . enoxaparin (LOVENOX) injection  40 mg Subcutaneous Q24H  . fentaNYL      . labetalol      . lidocaine      . metoprolol succinate  100 mg Oral Daily  . midazolam      . rosuvastatin  20 mg Oral Daily  . sodium chloride flush  3 mL Intravenous Once  . sodium chloride flush        Infusions: . sodium chloride      Allergies  Allergen Reactions  . Penicillins Anaphylaxis    Has patient had a PCN reaction causing immediate rash, facial/tongue/throat swelling, SOB or lightheadedness with hypotension: Yes Has patient had a PCN reaction causing severe rash involving mucus membranes or skin necrosis: Yes Has patient had a PCN reaction that required hospitalization  Yes Has patient had a PCN reaction occurring within the last 10 years: No If all of the above answers are "NO", then may proceed with Cephalosporin use.   . Ibuprofen Other (See Comments)    Gi upset    Social History   Socioeconomic History  . Marital status: Married    Spouse name: Not on file  . Number of children: Not on file  . Years of education: Not on file  . Highest education level: Not on file  Occupational History  . Not on file  Tobacco Use  . Smoking status: Former Smoker    Quit date: 11/25/1975    Years since quitting: 44.1  . Smokeless tobacco: Never Used  Substance and Sexual Activity  . Alcohol use: No  . Drug use: No  . Sexual activity: Not on file  Other Topics Concern  . Not on file  Social History Narrative  . Not on file   Social Determinants of Health   Financial Resource Strain:   . Difficulty of Paying Living Expenses:   Food Insecurity:   . Worried About Charity fundraiser in the Last Year:   . Arboriculturist in the Last Year:   Transportation Needs:   . Film/video editor (Medical):   Marland Kitchen Lack of Transportation (Non-Medical):  Physical Activity:   . Days of Exercise per Week:   . Minutes of Exercise per Session:   Stress:   . Feeling of Stress :   Social Connections:   . Frequency of Communication with Friends and Family:   . Frequency of Social Gatherings with Friends and Family:   . Attends Religious Services:   . Active Member of Clubs or Organizations:   . Attends Archivist Meetings:   Marland Kitchen Marital Status:   Intimate Partner Violence:   . Fear of Current or Ex-Partner:   . Emotionally Abused:   Marland Kitchen Physically Abused:   . Sexually Abused:     Family History  Problem Relation Age of Onset  . Hypertension Mother   . Diabetes Mother   . Depression Mother   . Hypertension Father     PHYSICAL EXAM: Vitals:   01/17/20 0855 01/17/20 0900  BP: (!) 207/147 (!) 173/112  Pulse: (!) 101 75  Resp: 16 18  Temp:     SpO2: 95% 98%     Intake/Output Summary (Last 24 hours) at 01/17/2020 0908 Last data filed at 01/17/2020 0744 Gross per 24 hour  Intake 240 ml  Output 1500 ml  Net -1260 ml    General:  Well appearing. No respiratory difficulty HEENT: normal Neck: supple. no JVD. Carotids 2+ bilat; no bruits. No lymphadenopathy or thryomegaly appreciated. Cor: PMI nondisplaced. Regular rate & rhythm. No rubs, gallops or murmurs. Lungs: clear Abdomen: soft, nontender, nondistended. No hepatosplenomegaly. No bruits or masses. Good bowel sounds. Extremities: no cyanosis, clubbing, rash, edema Neuro: alert & oriented x 3, cranial nerves grossly intact. moves all 4 extremities w/o difficulty. Affect pleasant.  ECG: Normal sinus rhythm no acute changes  Results for orders placed or performed during the hospital encounter of 01/15/20 (from the past 24 hour(s))  Lupus anticoagulant panel     Status: None   Collection Time: 01/16/20  2:50 PM  Result Value Ref Range   PTT Lupus Anticoagulant 32.6 0.0 - 51.9 sec   DRVVT 27.4 0.0 - 47.0 sec   Lupus Anticoag Interp Comment:   ESR     Status: None   Collection Time: 01/16/20  2:50 PM  Result Value Ref Range   Sed Rate 16 0 - 20 mm/hr  C-reactive protein     Status: None   Collection Time: 01/16/20  2:50 PM  Result Value Ref Range   CRP 0.5 <1.0 mg/dL   CT Angio Head W or Wo Contrast  Result Date: 01/15/2020 CLINICAL DATA:  Sudden onset headache and dizziness, history of aneurysm EXAM: CT ANGIOGRAPHY HEAD TECHNIQUE: Multidetector CT imaging of the head was performed using the standard protocol during bolus administration of intravenous contrast. Multiplanar CT image reconstructions and MIPs were obtained to evaluate the vascular anatomy. CONTRAST:  54m OMNIPAQUE IOHEXOL 350 MG/ML SOLN COMPARISON:  None. FINDINGS: CT HEAD Brain: There is no acute intracranial hemorrhage, mass effect, or edema. Gray-white differentiation is preserved. Patchy and confluent  areas of hypoattenuation in the supratentorial white matter nonspecific but may reflect advanced chronic microvascular ischemic changes. Chronic right frontal infarct. There is left cerebellar encephalomalacia underlying craniotomy. There is no extra-axial fluid collection. Prominence of the ventricles and sulci reflects minor generalized parenchymal volume loss. Vascular: There is atherosclerotic calcification at the skull base. Skull: Small left lateral suboccipital craniotomy. Sinuses/Orbits: No acute finding. Other: None. CTA HEAD Anterior circulation: Intracranial internal carotid arteries are patent with calcified plaque causing mild stenosis on the right and  up to moderate stenosis on the left. Anterior and middle cerebral arteries are patent. Posterior circulation: Intracranial vertebral arteries are patent. There is calcified and noncalcified plaque causing mild to moderate stenosis. Basilar artery is patent. Posterior cerebral arteries are patent. Venous sinuses: Not well evaluated. IMPRESSION: No acute intracranial abnormality.  Chronic findings detailed above. No aneurysm identified. Electronically Signed   By: Macy Mis M.D.   On: 01/15/2020 12:14   MR Brain Wo Contrast (neuro protocol)  Result Date: 01/15/2020 CLINICAL DATA:  64 year old male with sudden onset headache and dizziness. History of left suboccipital craniotomy. EXAM: MRI HEAD WITHOUT CONTRAST TECHNIQUE: Multiplanar, multiecho pulse sequences of the brain and surrounding structures were obtained without intravenous contrast. COMPARISON:  CTA head and neck earlier today.  Head CT 06/23/2013. FINDINGS: Brain: There are several small linear foci of restricted diffusion in the right cerebellar hemisphere (series 5). Associated T2 and FLAIR hyperintensity with no definite acute hemorrhage and no mass effect. Furthermore there are 4-5 similar small foci of restricted diffusion in both cerebral hemispheres, more numerous on the right and  including right basal ganglia and mesial temporal lobe involvement. No mass effect or definite acute hemorrhage. However, extensive chronic microhemorrhages scattered in the bilateral cerebellum, superimposed on more postoperative appearing hemosiderin and encephalomalacia in the left cerebellar hemisphere underlying the craniotomy. Chronic microhemorrhages are occasionally noted in the brainstem. Multiple similar chronic microhemorrhages in the bilateral deep gray matter nuclei, and occasionally elsewhere in the cerebral hemispheres. There is a small area of cortical encephalomalacia with hemosiderin in the anterior superior right frontal gyrus. Superimposed widespread patchy bilateral cerebral white matter T2 and FLAIR hyperintensity, and T2 heterogeneity throughout the deep gray matter nuclei. No other cortical encephalomalacia. But there is also focal encephalomalacia of the body of the corpus callosum. No midline shift, mass effect, evidence of mass lesion, ventriculomegaly, extra-axial collection or acute intracranial hemorrhage. Cervicomedullary junction and pituitary are within normal limits. Vascular: Major intracranial vascular flow voids are preserved, with generalized intracranial artery tortuosity. Skull and upper cervical spine: Negative visible cervical spine and bone marrow signal. Left suboccipital craniotomy better demonstrated by CT. Sinuses/Orbits: Negative orbits. Mild paranasal sinus mucosal thickening. Other: Visible internal auditory structures appear normal. Mastoids are clear. Mild left suboccipital scalp soft tissue scarring. IMPRESSION: 1. Positive for approximately 9 small foci of acute ischemia scattered in both cerebral hemispheres and the right cerebellar hemisphere. No acute hemorrhage or mass effect. This pattern is most suspicious for recent embolic event from the heart or proximal aorta, although synchronous small vessel disease may also be possible (see #2). 2. Numerous chronic  micro-hemorrhages in the cerebellum, also the brainstem, the deep gray nuclei. Comparatively little involvement of the cerebral hemispheres, arguing against amyloid angiopathy. Small chronic cortical infarct in the right superior frontal gyrus with hemosiderin. Extensive bilateral white matter and deep gray nuclei signal heterogeneity, and focal encephalomalacia in the body of the corpus callosum. 3. Superimposed chronic postoperative changes to the left cerebellar hemisphere with encephalomalacia. Electronically Signed   By: Genevie Ann M.D.   On: 01/15/2020 20:57   US Carotid Bilateral (at [facility] and [facility] only)  Result Date: 01/16/2020 CLINICAL DATA:  CVA.  History CAD and hypertension. EXAM: BILATERAL CAROTID DUPLEX ULTRASOUND TECHNIQUE: Pearline Cables scale imaging, color Doppler and duplex ultrasound were performed of bilateral carotid and vertebral arteries in the neck. COMPARISON:  None. FINDINGS: Criteria: Quantification of carotid stenosis is based on velocity parameters that correlate the residual internal carotid diameter with NASCET-based stenosis levels,  using the diameter of the distal internal carotid lumen as the denominator for stenosis measurement. The following velocity measurements were obtained: RIGHT ICA: 60/19 cm/sec CCA: 84/16 cm/sec SYSTOLIC ICA/CCA RATIO:  1.1 ECA: 70 cm/sec LEFT ICA: 65/22 cm/sec CCA: 60/63 cm/sec SYSTOLIC ICA/CCA RATIO:  1.0 ECA: 57 cm/sec RIGHT CAROTID ARTERY: There is a minimal amount of circumferential intimal thickening throughout the right common carotid artery (images 3, 7 and 11). There is a minimal amount of eccentric mixed echogenic plaque within the right carotid bulb (image 17), extending to involve the origin and proximal aspects of the right internal carotid artery (image 23), not resulting in elevated peak systolic velocities within the interrogated course of the right internal carotid artery to suggest a hemodynamically significant stenosis. RIGHT VERTEBRAL ARTERY:   Antegrade flow LEFT CAROTID ARTERY: There is a minimal amount of atherosclerotic plaque involving the proximal aspects of the left internal carotid artery (image 35). There is a minimal amount of intimal thickening involving the mid (image 39) and distal (image 43) aspects of the left common carotid artery. There is a minimal amount of intimal thickening involving the left carotid bulb, not resulting in elevated peak systolic velocities within the interrogated course of the left internal carotid artery to suggest a hemodynamically significant stenosis. LEFT VERTEBRAL ARTERY:  Antegrade flow IMPRESSION: Minimal amount of bilateral atherosclerotic plaque, not resulting in a hemodynamically significant stenosis within either internal carotid artery. Electronically Signed   By: Sandi Mariscal M.D.   On: 01/16/2020 09:03   US Venous Img Lower Bilateral (DVT)  Result Date: 01/16/2020 CLINICAL DATA:  Acute ischemia involving multiple vascular territories. EXAM: BILATERAL LOWER EXTREMITY VENOUS DOPPLER ULTRASOUND TECHNIQUE: Gray-scale sonography with graded compression, as well as color Doppler and duplex ultrasound were performed to evaluate the lower extremity deep venous systems from the level of the common femoral vein and including the common femoral, femoral, profunda femoral, popliteal and calf veins including the posterior tibial, peroneal and gastrocnemius veins when visible. The superficial great saphenous vein was also interrogated. Spectral Doppler was utilized to evaluate flow at rest and with distal augmentation maneuvers in the common femoral, femoral and popliteal veins. COMPARISON:  None. FINDINGS: RIGHT LOWER EXTREMITY Common Femoral Vein: No evidence of thrombus. Normal compressibility, respiratory phasicity and response to augmentation. Saphenofemoral Junction: No evidence of thrombus. Normal compressibility and flow on color Doppler imaging. Profunda Femoral Vein: No evidence of thrombus. Normal  compressibility and flow on color Doppler imaging. Femoral Vein: While the proximal and mid aspects of the femoral vein are patent, there is occlusive thrombus noted within one of the duplicated distal aspects of the right femoral vein (images 7, 9 and 28). Popliteal Vein: No evidence of thrombus. Normal compressibility, respiratory phasicity and response to augmentation. Calf Veins: No evidence of thrombus. Normal compressibility and flow on color Doppler imaging. Superficial Great Saphenous Vein: No evidence of thrombus. Normal compressibility. Other Findings:  None. LEFT LOWER EXTREMITY Common Femoral Vein: No evidence of thrombus. Normal compressibility, respiratory phasicity and response to augmentation. Saphenofemoral Junction: No evidence of thrombus. Normal compressibility and flow on color Doppler imaging. Profunda Femoral Vein: There is non-occlusive wall thickening/chronic DVT involving the imaged proximal aspect of the left deep femoral vein (images 42 and 47). Femoral Vein: No evidence of thrombus. Normal compressibility, respiratory phasicity and response to augmentation. Popliteal Vein: There is non-occlusive wall thickening/chronic DVT involving the distal aspect of the left popliteal vein (images 62 and 63). Calf Veins: No evidence of thrombus. Normal compressibility  and flow on color Doppler imaging. Superficial Great Saphenous Vein: No evidence of thrombus. Normal compressibility. Venous Reflux:  None. Other Findings:  None. IMPRESSION: 1. Age indeterminate occlusive thrombus within the distal aspect of the partially duplicated right femoral vein - while potentially chronic in etiology, in the absence of previous examinations, an acute on chronic process is not excluded. 2. Non-occlusive wall thickening/chronic DVT involving the left deep femoral and popliteal veins. Electronically Signed   By: Sandi Mariscal M.D.   On: 01/16/2020 08:16   ECHOCARDIOGRAM COMPLETE BUBBLE STUDY  Result Date:  01/16/2020    ECHOCARDIOGRAM REPORT   Patient Name:   YAASIR MENKEN [facility] Date of Exam: 01/16/2020 Medical Rec #:  [id number]         Height:       68.0 in Accession #:    [id number]        Weight:       222.0 lb Date of Birth:  10/15/1955         BSA:          2.136 m Patient Age:    58 years          BP:           184/98 mmHg Patient Gender: M                 HR:           72 bpm. Exam Location:  [facility] Procedure: 2D Echo, Saline Contrast Bubble Study, Cardiac Doppler and Color            Doppler Indications:     Stroke 434.91  History:         Patient has prior history of Echocardiogram examinations, most                  recent 01/22/2017. CAD, Signs/Symptoms:Chest Pain; Risk                  Factors:Hypertension. MI.  Sonographer:     Sherrie Sport RDCS (AE) Referring Phys:  [id number] Cleaster Corin PATEL Diagnosing Phys: Kate Sable MD IMPRESSIONS  1. Left ventricular ejection fraction, by estimation, is 55 to 60%. The left ventricle has normal function. The left ventricle has no regional wall motion abnormalities. There is mild left ventricular hypertrophy. Left ventricular diastolic parameters are consistent with Grade I diastolic dysfunction (impaired relaxation).  2. Right ventricular systolic function is normal. The right ventricular size is normal.  3. Left atrial size was moderately dilated.  4. The mitral valve is grossly normal. No evidence of mitral valve regurgitation.  5. The aortic valve is tricuspid. Aortic valve regurgitation is trivial.  6. Agitated saline contrast bubble study was negative, with no evidence of any interatrial shunt. FINDINGS  Left Ventricle: Left ventricular ejection fraction, by estimation, is 55 to 60%. The left ventricle has normal function. The left ventricle has no regional wall motion abnormalities. The left ventricular internal cavity size was normal in size. There is  mild left ventricular hypertrophy. Left ventricular diastolic parameters are consistent with Grade I diastolic  dysfunction (impaired relaxation). Right Ventricle: The right ventricular size is normal. No increase in right ventricular wall thickness. Right ventricular systolic function is normal. Left Atrium: Left atrial size was moderately dilated. Right Atrium: Right atrial size was normal in size. Pericardium: There is no evidence of pericardial effusion. Mitral Valve: The mitral valve is grossly normal. Mild to moderate mitral annular calcification. No evidence of mitral valve regurgitation.  Tricuspid Valve: The tricuspid valve is grossly normal. Tricuspid valve regurgitation is not demonstrated. Aortic Valve: The aortic valve is tricuspid. Aortic valve regurgitation is trivial. Aortic regurgitation PHT measures 650 msec. Aortic valve mean gradient measures 5.0 mmHg. Aortic valve peak gradient measures 8.4 mmHg. Aortic valve area, by VTI measures  3.82 cm. Pulmonic Valve: The pulmonic valve was not well visualized. Pulmonic valve regurgitation is not visualized. Aorta: The aortic root is normal in size and structure. Venous: The inferior vena cava was not well visualized. IAS/Shunts: No atrial level shunt detected by color flow Doppler. Agitated saline contrast was given intravenously to evaluate for intracardiac shunting. Agitated saline contrast bubble study was negative, with no evidence of any interatrial shunt.  LEFT VENTRICLE PLAX 2D LVIDd:         3.53 cm  Diastology LVIDs:         2.55 cm  LV e' lateral:   4.46 cm/s LV PW:         1.47 cm  LV E/e' lateral: 15.8 LV IVS:        1.96 cm  LV e' medial:    4.68 cm/s LVOT diam:     2.20 cm  LV E/e' medial:  15.0 LV SV:         102 LV SV Index:   48 LVOT Area:     3.80 cm  RIGHT VENTRICLE RV S prime:     16.00 cm/s TAPSE (M-mode): 4.2 cm LEFT ATRIUM              Index       RIGHT ATRIUM           Index LA diam:        4.20 cm  1.97 cm/m  RA Area:     15.20 cm LA Vol (A2C):   124.0 ml 58.04 ml/m RA Volume:   33.40 ml  15.63 ml/m LA Vol (A4C):   87.8 ml  41.10 ml/m  LA Biplane Vol: 105.0 ml 49.15 ml/m  AORTIC VALVE                    PULMONIC VALVE AV Area (Vmax):    3.17 cm     PV Vmax:        0.76 m/s AV Area (Vmean):   3.02 cm     PV Peak grad:   2.3 mmHg AV Area (VTI):     3.82 cm     RVOT Peak grad: 2 mmHg AV Vmax:           145.00 cm/s AV Vmean:          104.000 cm/s AV VTI:            0.266 m AV Peak Grad:      8.4 mmHg AV Mean Grad:      5.0 mmHg LVOT Vmax:         121.00 cm/s LVOT Vmean:        82.700 cm/s LVOT VTI:          0.268 m LVOT/AV VTI ratio: 1.01 AI PHT:            650 msec  AORTA Ao Root diam: 3.40 cm MITRAL VALVE                TRICUSPID VALVE MV Area (PHT): 2.48 cm     TR Peak grad:   15.1 mmHg MV Decel Time: 306 msec     TR Vmax:  194.00 cm/s MV E velocity: 70.30 cm/s MV A velocity: 117.00 cm/s  SHUNTS MV E/A ratio:  0.60         Systemic VTI:  0.27 m                             Systemic Diam: 2.20 cm Kate Sable MD Electronically signed by Kate Sable MD Signature Date/Time: 01/16/2020/12:25:48 PM    Final      ASSESSMENT AND PLAN: Bilateral CVA with likely cause uncontrolled hypertension due to noncompliance with medication.  Transesophageal echocardiogram was done which showed normal left ventricular systolic function mild mitral regurgitation mild tricuspid regurgitation.  Mild aortic regurgitation.  There was no thrombi in the left ventricle left atrium or left atrial appendage but there was severe LVH.  Bubble study was negative and no evidence of atrial septal defect.  There was mild nonprotruding plaque in the aortic arch and ascending aorta which is probably not very significant.  I feel hypertensive crisis led to patient having stroke.  Patient will be started on chlorthalidone 25 mg and enalapril 20 mg since BP systolic was 250 and diastolic was 539 during procedure.  Patient will also need event monitor once patient is discharged and will be done in the office as well as will do outpatient work-up for coronary artery  disease.  I discussed with the son that patient needs to be compliant with medication and needs to be closely followed and will work on that.  Demetrion Wesby A

## 2020-01-17 NOTE — Progress Notes (Signed)
*  PRELIMINARY RESULTS* Echocardiogram Echocardiogram Transesophageal has been performed.  Cristela Blue 01/17/2020, 9:08 AM

## 2020-01-17 NOTE — Consult Note (Signed)
Subjective: back to baseline. Son at bedside.   Past Medical History:  Diagnosis Date  . CAD (coronary artery disease)   . Chest pain   . DU (duodenal ulcer)   . GAD (generalized anxiety disorder)   . Hypertension   . Myocardial infarction (HCC)   . Radiculopathy of thoracolumbar region   . SVT (supraventricular tachycardia) (HCC)   . Tachycardia     Past Surgical History:  Procedure Laterality Date  . BRAIN SURGERY    . CARDIAC CATHETERIZATION Right 11/25/2015   Procedure: Left Heart Cath and Coronary Angiography;  Surgeon: Laurier Nancy, MD;  Location: [facility] INVASIVE CV LAB;  Service: Cardiovascular;  Laterality: Right;    Family History  Problem Relation Age of Onset  . Hypertension Mother   . Diabetes Mother   . Depression Mother   . Hypertension Father     Social History:  reports that he quit smoking about 44 years ago. He has never used smokeless tobacco. He reports that he does not drink alcohol or use drugs.  Allergies  Allergen Reactions  . Penicillins Anaphylaxis    Has patient had a PCN reaction causing immediate rash, facial/tongue/throat swelling, SOB or lightheadedness with hypotension: Yes Has patient had a PCN reaction causing severe rash involving mucus membranes or skin necrosis: Yes Has patient had a PCN reaction that required hospitalization Yes Has patient had a PCN reaction occurring within the last 10 years: No If all of the above answers are "NO", then may proceed with Cephalosporin use.   . Ibuprofen Other (See Comments)    Gi upset    Medications: I have reviewed the patient's current medications.  Dermatological ROS: negative for rash and skin lesion changes  Physical Examination: Blood pressure (!) 156/121, pulse 100, temperature 98.6 F (37 C), resp. rate 20, height  (1.727 m), weight 100.7 kg, SpO2 98 %.    Neurological Examination   Mental Status: Alert, oriented, thought content appropriate.  Speech fluent without evidence  of aphasia.  Able to follow 3 step commands without difficulty. Cranial Nerves: II: Discs flat bilaterally; Visual fields grossly normal, pupils equal, round, reactive to light and accommodation III,IV, VI: ptosis not present, extra-ocular motions intact bilaterally V,VII: smile symmetric, facial light touch sensation normal bilaterally VIII: hearing normal bilaterally IX,X: gag reflex present XI: bilateral shoulder shrug XII: midline tongue extension Motor: Right : Upper extremity   5/5    Left:     Upper extremity   5/5  Lower extremity   5/5     Lower extremity   5/5 Tone and bulk:normal tone throughout; no atrophy noted Sensory: Pinprick and light touch intact throughout, bilaterally Deep Tendon Reflexes: 1+ and symmetric throughout Plantars: Right: downgoing   Left: downgoing Cerebellar: normal finger-to-nose, normal rapid alternating movements and normal heel-to-shin test Gait: normal gait      Laboratory Studies:   Basic Metabolic Panel: Recent Labs  Lab 01/15/20 1059 01/15/20 2258  NA 136 138  K 3.8 3.6  CL 101 103  CO2 27 26  GLUCOSE 180* 114*  BUN 21 21  CREATININE 1.38* 1.28*  CALCIUM 9.5 9.2    Liver Function Tests: No results for input(s): AST, ALT, ALKPHOS, BILITOT, PROT, ALBUMIN in the last 168 hours. No results for input(s): LIPASE, AMYLASE in the last 168 hours. No results for input(s): AMMONIA in the last 168 hours.  CBC: Recent Labs  Lab 01/15/20 1059 01/15/20 2258  WBC 7.5 8.0  HGB 14.6 14.3  HCT  42.9 42.7  MCV 87.7 88.4  PLT 245 218    Cardiac Enzymes: No results for input(s): CKTOTAL, CKMB, CKMBINDEX, TROPONINI in the last 168 hours.  BNP: Invalid input(s): POCBNP  CBG: No results for input(s): GLUCAP in the last 168 hours.  Microbiology: Results for orders placed or performed during the hospital encounter of 01/15/20  SARS Coronavirus 2 by RT PCR (hospital order, performed in [facility] hospital lab) Nasopharyngeal  Nasopharyngeal Swab     Status: None   Collection Time: 01/15/20  6:55 PM   Specimen: Nasopharyngeal Swab  Result Value Ref Range Status   SARS Coronavirus 2 NEGATIVE NEGATIVE Final    Comment: (NOTE) SARS-CoV-2 target nucleic acids are NOT DETECTED. The SARS-CoV-2 RNA is generally detectable in upper and lower respiratory specimens during the acute phase of infection. The lowest concentration of SARS-CoV-2 viral copies this assay can detect is 250 copies / mL. A negative result does not preclude SARS-CoV-2 infection and should not be used as the sole basis for treatment or other patient management decisions.  A negative result may occur with improper specimen collection / handling, submission of specimen other than nasopharyngeal swab, presence of viral mutation(s) within the areas targeted by this assay, and inadequate number of viral copies (<250 copies / mL). A negative result must be combined with clinical observations, patient history, and epidemiological information. Fact Sheet for Patients:   BoilerBrush.com.cy Fact Sheet for Healthcare Providers: https://pope.com/ This test is not yet approved or cleared  by the [place] FDA and has been authorized for detection and/or diagnosis of SARS-CoV-2 by FDA under an Emergency Use Authorization (EUA).  This EUA will remain in effect (meaning this test can be used) for the duration of the COVID-19 declaration under Section 564(b)(1) of the Act, 21 U.S.C. section 360bbb-3(b)(1), unless the authorization is terminated or revoked sooner. Performed at [facility], [address] [place], [place] [address]     Coagulation Studies: No results for input(s): LABPROT, INR in the last 72 hours.  Urinalysis:  Recent Labs  Lab 01/15/20 1813  COLORURINE YELLOW*  LABSPEC 1.040*  PHURINE 6.0  GLUCOSEU NEGATIVE  HGBUR NEGATIVE  BILIRUBINUR NEGATIVE  KETONESUR NEGATIVE   PROTEINUR NEGATIVE  NITRITE NEGATIVE  LEUKOCYTESUR NEGATIVE    Lipid Panel:     Component Value Date/Time   CHOL 148 01/16/2020 0055   CHOL 169 08/14/2019 0831   CHOL 213 (H) 06/24/2013 0416   TRIG 99 01/16/2020 0055   TRIG 155 06/24/2013 0416   HDL 36 (L) 01/16/2020 0055   HDL 42 08/14/2019 0831   HDL 37 (L) 06/24/2013 0416   CHOLHDL 4.1 01/16/2020 0055   VLDL 20 01/16/2020 0055   VLDL 31 06/24/2013 0416   LDLCALC 92 01/16/2020 0055   LDLCALC 102 (H) 08/14/2019 0831   LDLCALC 145 (H) 06/24/2013 0416    HgbA1C:  Lab Results  Component Value Date   HGBA1C 6.3 (H) 01/16/2020    Urine Drug Screen:      Component Value Date/Time   LABOPIA NONE DETECTED 01/15/2020 1813   COCAINSCRNUR NONE DETECTED 01/15/2020 1813   LABBENZ NONE DETECTED 01/15/2020 1813   AMPHETMU NONE DETECTED 01/15/2020 1813   THCU NONE DETECTED 01/15/2020 1813   LABBARB NONE DETECTED 01/15/2020 1813    Alcohol Level: No results for input(s): ETH in the last 168 hours.  Other results: EKG: normal EKG, normal sinus rhythm, unchanged from previous tracings.  Imaging: CT Angio Head W or Wo Contrast  Result  Date: 01/15/2020 CLINICAL DATA:  Sudden onset headache and dizziness, history of aneurysm EXAM: CT ANGIOGRAPHY HEAD TECHNIQUE: Multidetector CT imaging of the head was performed using the standard protocol during bolus administration of intravenous contrast. Multiplanar CT image reconstructions and MIPs were obtained to evaluate the vascular anatomy. CONTRAST:  77mL OMNIPAQUE IOHEXOL 350 MG/ML SOLN COMPARISON:  None. FINDINGS: CT HEAD Brain: There is no acute intracranial hemorrhage, mass effect, or edema. Gray-white differentiation is preserved. Patchy and confluent areas of hypoattenuation in the supratentorial white matter nonspecific but may reflect advanced chronic microvascular ischemic changes. Chronic right frontal infarct. There is left cerebellar encephalomalacia underlying craniotomy. There is  no extra-axial fluid collection. Prominence of the ventricles and sulci reflects minor generalized parenchymal volume loss. Vascular: There is atherosclerotic calcification at the skull base. Skull: Small left lateral suboccipital craniotomy. Sinuses/Orbits: No acute finding. Other: None. CTA HEAD Anterior circulation: Intracranial internal carotid arteries are patent with calcified plaque causing mild stenosis on the right and up to moderate stenosis on the left. Anterior and middle cerebral arteries are patent. Posterior circulation: Intracranial vertebral arteries are patent. There is calcified and noncalcified plaque causing mild to moderate stenosis. Basilar artery is patent. Posterior cerebral arteries are patent. Venous sinuses: Not well evaluated. IMPRESSION: No acute intracranial abnormality.  Chronic findings detailed above. No aneurysm identified. Electronically Signed   By: Macy Mis M.D.   On: 01/15/2020 12:14   MR Brain Wo Contrast (neuro protocol)  Result Date: 01/15/2020 CLINICAL DATA:  64 year old male with sudden onset headache and dizziness. History of left suboccipital craniotomy. EXAM: MRI HEAD WITHOUT CONTRAST TECHNIQUE: Multiplanar, multiecho pulse sequences of the brain and surrounding structures were obtained without intravenous contrast. COMPARISON:  CTA head and neck earlier today.  Head CT 06/23/2013. FINDINGS: Brain: There are several small linear foci of restricted diffusion in the right cerebellar hemisphere (series 5). Associated T2 and FLAIR hyperintensity with no definite acute hemorrhage and no mass effect. Furthermore there are 4-5 similar small foci of restricted diffusion in both cerebral hemispheres, more numerous on the right and including right basal ganglia and mesial temporal lobe involvement. No mass effect or definite acute hemorrhage. However, extensive chronic microhemorrhages scattered in the bilateral cerebellum, superimposed on more postoperative appearing  hemosiderin and encephalomalacia in the left cerebellar hemisphere underlying the craniotomy. Chronic microhemorrhages are occasionally noted in the brainstem. Multiple similar chronic microhemorrhages in the bilateral deep gray matter nuclei, and occasionally elsewhere in the cerebral hemispheres. There is a small area of cortical encephalomalacia with hemosiderin in the anterior superior right frontal gyrus. Superimposed widespread patchy bilateral cerebral white matter T2 and FLAIR hyperintensity, and T2 heterogeneity throughout the deep gray matter nuclei. No other cortical encephalomalacia. But there is also focal encephalomalacia of the body of the corpus callosum. No midline shift, mass effect, evidence of mass lesion, ventriculomegaly, extra-axial collection or acute intracranial hemorrhage. Cervicomedullary junction and pituitary are within normal limits. Vascular: Major intracranial vascular flow voids are preserved, with generalized intracranial artery tortuosity. Skull and upper cervical spine: Negative visible cervical spine and bone marrow signal. Left suboccipital craniotomy better demonstrated by CT. Sinuses/Orbits: Negative orbits. Mild paranasal sinus mucosal thickening. Other: Visible internal auditory structures appear normal. Mastoids are clear. Mild left suboccipital scalp soft tissue scarring. IMPRESSION: 1. Positive for approximately 9 small foci of acute ischemia scattered in both cerebral hemispheres and the right cerebellar hemisphere. No acute hemorrhage or mass effect. This pattern is most suspicious for recent embolic event from the heart or proximal  aorta, although synchronous small vessel disease may also be possible (see #2). 2. Numerous chronic micro-hemorrhages in the cerebellum, also the brainstem, the deep gray nuclei. Comparatively little involvement of the cerebral hemispheres, arguing against amyloid angiopathy. Small chronic cortical infarct in the right superior frontal  gyrus with hemosiderin. Extensive bilateral white matter and deep gray nuclei signal heterogeneity, and focal encephalomalacia in the body of the corpus callosum. 3. Superimposed chronic postoperative changes to the left cerebellar hemisphere with encephalomalacia. Electronically Signed   By: Odessa Fleming M.D.   On: 01/15/2020 20:57   US Carotid Bilateral (at [facility] and [facility] only)  Result Date: 01/16/2020 CLINICAL DATA:  CVA.  History CAD and hypertension. EXAM: BILATERAL CAROTID DUPLEX ULTRASOUND TECHNIQUE: Wallace Cullens scale imaging, color Doppler and duplex ultrasound were performed of bilateral carotid and vertebral arteries in the neck. COMPARISON:  None. FINDINGS: Criteria: Quantification of carotid stenosis is based on velocity parameters that correlate the residual internal carotid diameter with NASCET-based stenosis levels, using the diameter of the distal internal carotid lumen as the denominator for stenosis measurement. The following velocity measurements were obtained: RIGHT ICA: 60/19 cm/sec CCA: 57/18 cm/sec SYSTOLIC ICA/CCA RATIO:  1.1 ECA: 70 cm/sec LEFT ICA: 65/22 cm/sec CCA: 70/20 cm/sec SYSTOLIC ICA/CCA RATIO:  1.0 ECA: 57 cm/sec RIGHT CAROTID ARTERY: There is a minimal amount of circumferential intimal thickening throughout the right common carotid artery (images 3, 7 and 11). There is a minimal amount of eccentric mixed echogenic plaque within the right carotid bulb (image 17), extending to involve the origin and proximal aspects of the right internal carotid artery (image 23), not resulting in elevated peak systolic velocities within the interrogated course of the right internal carotid artery to suggest a hemodynamically significant stenosis. RIGHT VERTEBRAL ARTERY:  Antegrade flow LEFT CAROTID ARTERY: There is a minimal amount of atherosclerotic plaque involving the proximal aspects of the left internal carotid artery (image 35). There is a minimal amount of intimal thickening involving the mid (image  39) and distal (image 43) aspects of the left common carotid artery. There is a minimal amount of intimal thickening involving the left carotid bulb, not resulting in elevated peak systolic velocities within the interrogated course of the left internal carotid artery to suggest a hemodynamically significant stenosis. LEFT VERTEBRAL ARTERY:  Antegrade flow IMPRESSION: Minimal amount of bilateral atherosclerotic plaque, not resulting in a hemodynamically significant stenosis within either internal carotid artery. Electronically Signed   By: Simonne Come M.D.   On: 01/16/2020 09:03   US Venous Img Lower Bilateral (DVT)  Result Date: 01/16/2020 CLINICAL DATA:  Acute ischemia involving multiple vascular territories. EXAM: BILATERAL LOWER EXTREMITY VENOUS DOPPLER ULTRASOUND TECHNIQUE: Gray-scale sonography with graded compression, as well as color Doppler and duplex ultrasound were performed to evaluate the lower extremity deep venous systems from the level of the common femoral vein and including the common femoral, femoral, profunda femoral, popliteal and calf veins including the posterior tibial, peroneal and gastrocnemius veins when visible. The superficial great saphenous vein was also interrogated. Spectral Doppler was utilized to evaluate flow at rest and with distal augmentation maneuvers in the common femoral, femoral and popliteal veins. COMPARISON:  None. FINDINGS: RIGHT LOWER EXTREMITY Common Femoral Vein: No evidence of thrombus. Normal compressibility, respiratory phasicity and response to augmentation. Saphenofemoral Junction: No evidence of thrombus. Normal compressibility and flow on color Doppler imaging. Profunda Femoral Vein: No evidence of thrombus. Normal compressibility and flow on color Doppler imaging. Femoral Vein: While the proximal and mid aspects of  the femoral vein are patent, there is occlusive thrombus noted within one of the duplicated distal aspects of the right femoral vein (images 7,  9 and 28). Popliteal Vein: No evidence of thrombus. Normal compressibility, respiratory phasicity and response to augmentation. Calf Veins: No evidence of thrombus. Normal compressibility and flow on color Doppler imaging. Superficial Great Saphenous Vein: No evidence of thrombus. Normal compressibility. Other Findings:  None. LEFT LOWER EXTREMITY Common Femoral Vein: No evidence of thrombus. Normal compressibility, respiratory phasicity and response to augmentation. Saphenofemoral Junction: No evidence of thrombus. Normal compressibility and flow on color Doppler imaging. Profunda Femoral Vein: There is non-occlusive wall thickening/chronic DVT involving the imaged proximal aspect of the left deep femoral vein (images 42 and 47). Femoral Vein: No evidence of thrombus. Normal compressibility, respiratory phasicity and response to augmentation. Popliteal Vein: There is non-occlusive wall thickening/chronic DVT involving the distal aspect of the left popliteal vein (images 62 and 63). Calf Veins: No evidence of thrombus. Normal compressibility and flow on color Doppler imaging. Superficial Great Saphenous Vein: No evidence of thrombus. Normal compressibility. Venous Reflux:  None. Other Findings:  None. IMPRESSION: 1. Age indeterminate occlusive thrombus within the distal aspect of the partially duplicated right femoral vein - while potentially chronic in etiology, in the absence of previous examinations, an acute on chronic process is not excluded. 2. Non-occlusive wall thickening/chronic DVT involving the left deep femoral and popliteal veins. Electronically Signed   By: Simonne Come M.D.   On: 01/16/2020 08:16   ECHO TEE  Result Date: 01/17/2020    TRANSESOPHOGEAL ECHO REPORT   Patient Name:   Howard Weaver [facility] Date of Exam: 01/17/2020 Medical Rec #:  [id number]         Height:       68.0 in Accession #:    [id number]        Weight:       222.0 lb Date of Birth:  04-11-1956         BSA:          2.136 m Patient  Age:    63 years          BP:           168/103 mmHg Patient Gender: M                 HR:           85 bpm. Exam Location:  [facility] Procedure: Transesophageal Echo, Cardiac Doppler, Color Doppler and Saline            Contrast Bubble Study Indications:     None listed  History:         Patient has prior history of Echocardiogram examinations, most                  recent 01/16/2020. Previous Myocardial Infarction and CAD,                  Signs/Symptoms:Chest Pain; Risk Factors:Hypertension.  Sonographer:     Cristela Blue RDCS (AE) Referring Phys:  Lajuana Carry A KHAN Diagnosing Phys: Adrian Blackwater MD PROCEDURE: The transesophogeal probe was passed without difficulty through the esophogus of the patient. Sedation performed by performing physician. The patient's vital signs; including heart rate, blood pressure, and oxygen saturation; remained stable throughout the procedure. The patient developed no complications during the procedure. IMPRESSIONS  1. Left ventricular ejection fraction, by estimation, is 60 to 65%. The left ventricle has normal function. The left ventricle  has no regional wall motion abnormalities. There is severe concentric left ventricular hypertrophy of the apical segment.  2. Right ventricular systolic function is normal. The right ventricular size is normal.  3. Left atrial size was mildly dilated. No left atrial/left atrial appendage thrombus was detected.  4. Right atrial size was mildly dilated.  5. The mitral valve is normal in structure. Mild mitral valve regurgitation. No evidence of mitral stenosis.  6. The aortic valve is normal in structure. Aortic valve regurgitation is mild. No aortic stenosis is present.  7. The inferior vena cava is normal in size with greater than 50% respiratory variability, suggesting right atrial pressure of 3 mmHg.  8. Agitated saline contrast bubble study was negative, with no evidence of any interatrial shunt. Conclusion(s)/Recommendation(s): Normal biventricular  function without evidence of hemodynamically significant valvular heart disease. No source of thrombii seen. FINDINGS  Left Ventricle: Left ventricular ejection fraction, by estimation, is 60 to 65%. The left ventricle has normal function. The left ventricle has no regional wall motion abnormalities. The left ventricular internal cavity size was normal in size. There is  severe concentric left ventricular hypertrophy of the apical segment. Right Ventricle: The right ventricular size is normal. No increase in right ventricular wall thickness. Right ventricular systolic function is normal. Left Atrium: Left atrial size was mildly dilated. No left atrial/left atrial appendage thrombus was detected. Right Atrium: Right atrial size was mildly dilated. Pericardium: There is no evidence of pericardial effusion. Mitral Valve: The mitral valve is normal in structure. Normal mobility of the mitral valve leaflets. Mild mitral valve regurgitation. No evidence of mitral valve stenosis. Tricuspid Valve: The tricuspid valve is normal in structure. Tricuspid valve regurgitation is trivial. No evidence of tricuspid stenosis. Aortic Valve: The aortic valve is normal in structure. Aortic valve regurgitation is mild. No aortic stenosis is present. Pulmonic Valve: The pulmonic valve was normal in structure. Pulmonic valve regurgitation is trivial. No evidence of pulmonic stenosis. Aorta: The aortic root is normal in size and structure. Venous: The inferior vena cava is normal in size with greater than 50% respiratory variability, suggesting right atrial pressure of 3 mmHg. IAS/Shunts: No atrial level shunt detected by color flow Doppler. Agitated saline contrast was given intravenously to evaluate for intracardiac shunting. Agitated saline contrast bubble study was negative, with no evidence of any interatrial shunt. There  is no evidence of an atrial septal defect. Adrian Blackwater MD Electronically signed by Adrian Blackwater MD Signature  Date/Time: 01/17/2020/9:18:49 AM    Final    ECHOCARDIOGRAM COMPLETE BUBBLE STUDY  Result Date: 01/16/2020    ECHOCARDIOGRAM REPORT   Patient Name:   BURTON GAHAN [facility] Date of Exam: 01/16/2020 Medical Rec #:  [id number]         Height:       68.0 in Accession #:    [id number]        Weight:       222.0 lb Date of Birth:  1956/04/10         BSA:          2.136 m Patient Age:    63 years          BP:           184/98 mmHg Patient Gender: M                 HR:           72 bpm. Exam Location:  [facility] Procedure: 2D Echo, Saline Contrast Bubble  Study, Cardiac Doppler and Color            Doppler Indications:     Stroke 434.91  History:         Patient has prior history of Echocardiogram examinations, most                  recent 01/22/2017. CAD, Signs/Symptoms:Chest Pain; Risk                  Factors:Hypertension. MI.  Sonographer:     Cristela BlueJerry Hege RDCS (AE) Referring Phys:  [id number] Floreen ComberVISHAL R PATEL Diagnosing Phys: Debbe OdeaBrian Agbor-Etang MD IMPRESSIONS  1. Left ventricular ejection fraction, by estimation, is 55 to 60%. The left ventricle has normal function. The left ventricle has no regional wall motion abnormalities. There is mild left ventricular hypertrophy. Left ventricular diastolic parameters are consistent with Grade I diastolic dysfunction (impaired relaxation).  2. Right ventricular systolic function is normal. The right ventricular size is normal.  3. Left atrial size was moderately dilated.  4. The mitral valve is grossly normal. No evidence of mitral valve regurgitation.  5. The aortic valve is tricuspid. Aortic valve regurgitation is trivial.  6. Agitated saline contrast bubble study was negative, with no evidence of any interatrial shunt. FINDINGS  Left Ventricle: Left ventricular ejection fraction, by estimation, is 55 to 60%. The left ventricle has normal function. The left ventricle has no regional wall motion abnormalities. The left ventricular internal cavity size was normal in size. There is  mild left  ventricular hypertrophy. Left ventricular diastolic parameters are consistent with Grade I diastolic dysfunction (impaired relaxation). Right Ventricle: The right ventricular size is normal. No increase in right ventricular wall thickness. Right ventricular systolic function is normal. Left Atrium: Left atrial size was moderately dilated. Right Atrium: Right atrial size was normal in size. Pericardium: There is no evidence of pericardial effusion. Mitral Valve: The mitral valve is grossly normal. Mild to moderate mitral annular calcification. No evidence of mitral valve regurgitation. Tricuspid Valve: The tricuspid valve is grossly normal. Tricuspid valve regurgitation is not demonstrated. Aortic Valve: The aortic valve is tricuspid. Aortic valve regurgitation is trivial. Aortic regurgitation PHT measures 650 msec. Aortic valve mean gradient measures 5.0 mmHg. Aortic valve peak gradient measures 8.4 mmHg. Aortic valve area, by VTI measures  3.82 cm. Pulmonic Valve: The pulmonic valve was not well visualized. Pulmonic valve regurgitation is not visualized. Aorta: The aortic root is normal in size and structure. Venous: The inferior vena cava was not well visualized. IAS/Shunts: No atrial level shunt detected by color flow Doppler. Agitated saline contrast was given intravenously to evaluate for intracardiac shunting. Agitated saline contrast bubble study was negative, with no evidence of any interatrial shunt.  LEFT VENTRICLE PLAX 2D LVIDd:         3.53 cm  Diastology LVIDs:         2.55 cm  LV e' lateral:   4.46 cm/s LV PW:         1.47 cm  LV E/e' lateral: 15.8 LV IVS:        1.96 cm  LV e' medial:    4.68 cm/s LVOT diam:     2.20 cm  LV E/e' medial:  15.0 LV SV:         102 LV SV Index:   48 LVOT Area:     3.80 cm  RIGHT VENTRICLE RV S prime:     16.00 cm/s TAPSE (M-mode): 4.2 cm LEFT ATRIUM  Index       RIGHT ATRIUM           Index LA diam:        4.20 cm  1.97 cm/m  RA Area:     15.20 cm LA Vol  (A2C):   124.0 ml 58.04 ml/m RA Volume:   33.40 ml  15.63 ml/m LA Vol (A4C):   87.8 ml  41.10 ml/m LA Biplane Vol: 105.0 ml 49.15 ml/m  AORTIC VALVE                    PULMONIC VALVE AV Area (Vmax):    3.17 cm     PV Vmax:        0.76 m/s AV Area (Vmean):   3.02 cm     PV Peak grad:   2.3 mmHg AV Area (VTI):     3.82 cm     RVOT Peak grad: 2 mmHg AV Vmax:           145.00 cm/s AV Vmean:          104.000 cm/s AV VTI:            0.266 m AV Peak Grad:      8.4 mmHg AV Mean Grad:      5.0 mmHg LVOT Vmax:         121.00 cm/s LVOT Vmean:        82.700 cm/s LVOT VTI:          0.268 m LVOT/AV VTI ratio: 1.01 AI PHT:            650 msec  AORTA Ao Root diam: 3.40 cm MITRAL VALVE                TRICUSPID VALVE MV Area (PHT): 2.48 cm     TR Peak grad:   15.1 mmHg MV Decel Time: 306 msec     TR Vmax:        194.00 cm/s MV E velocity: 70.30 cm/s MV A velocity: 117.00 cm/s  SHUNTS MV E/A ratio:  0.60         Systemic VTI:  0.27 m                             Systemic Diam: 2.20 cm Debbe Odea MD Electronically signed by Debbe Odea MD Signature Date/Time: 01/16/2020/12:25:48 PM    Final      Assessment/Plan:  64 y.o. 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 Was sitting outside when he felt dizzy, gait became unsteady, symptoms lasted 3 hours, and he came to the ED. On MRI patient has bilateral cerebellar strokes along with chronic microhemorrahges. Now improved and back to baseline.   - Strokes do appear to be embolic. He is currently back to baseline - No anti platelet therapy at home - Currently on ASA 325 daily - s/p TEE today and TTE yesterday without any thrombi or PFO - Patient has LE DVT and will need anticoagulation.   - Likely anticoagulation for DVT which can be started in 1-2 days.   - Stop ASA when starting anticoagulation  - cardiac monitor on d/c.   01/17/2020, 10:42 AM

## 2020-01-17 NOTE — Plan of Care (Signed)
  Problem: Education: Goal: Knowledge of disease or condition will improve Outcome: Progressing   Problem: Education: Goal: Knowledge of secondary prevention will improve Outcome: Progressing   Problem: Education: Goal: Knowledge of patient specific risk factors addressed and post discharge goals established will improve Outcome: Progressing   Problem: Coping: Goal: Will identify appropriate support needs Outcome: Progressing   Problem: Self-Care: Goal: Ability to communicate needs accurately will improve Outcome: Progressing

## 2020-01-17 NOTE — Discharge Summary (Signed)
Physician Discharge Summary  Howard Weaver QAS:[id number] DOB: [birth year] DOA: 01/15/2020  PCP: [name], NP  Admit date: 01/15/2020 Discharge date: 01/17/2020  Admitted From: Home Disposition:  Home  Recommendations for Outpatient Follow-up:  1. Follow up with PCP in 1-2 weeks 2. Follow-up with cardiology for long-term monitor. 3. Follow-up with neurology. 4. Please obtain BMP/CBC in one week 5. Please follow up on the following pending results: Cardiolipin antibodies, ANA and beta-2 glycoprotein antibodies.  Home Health: No Equipment/Devices: None Discharge Condition: Stable CODE STATUS: Full Diet recommendation: Heart Healthy / Carb Modified   Brief/Interim Summary: Howard Conch Zilempeis a 64 y.o.malewith medical history significant forCAD, SVT, hypertension, CKD stage III, PVD,history of brain aneurysm s/p clipping in [place] GAD who presents to the ED for evaluation of dizziness. MRI brain showing approximately 9 small foci of acute ischemia scattered in both cerebral hemispheres in the right cerebellar hemisphere. No acute hemorrhage or mass-effect seen. Concern for embolic etiology. Numerous chronic microhemorrhages also noted.  Admitted for further work-up. Echocardiogram with bubble studies was negative for any PFO, CTA head without any significant stenosis.  Ultrasound carotid was without any negative stenosis.  Bilateral lower extremity venous Doppler was positive for bilateral DVTs which seems chronic. TEE without any significant abnormalities, thrombus or obvious septal defect. Patient will follow up with cardiology as an outpatient for long-term monitoring to rule out any arrhythmia. There was no focal neurologic deficit.  He was given referral for outpatient physical therapy.  We also tested him for antiphospholipid syndrome due to his thromboembolic events.  Patient had bilateral DVTs without any risk factor.  No history of recent travel.  Patient  is quite mobile.  CRP, ESR, antilupus antibodies were negative.  Cardiolipin antibodies, ANA and beta-2 glycoprotein antibodies results are pending. If no lab evidence of antiphospholipid syndrome.  He might need oncologic work-up to rule out any underlying malignancy for this predisposition for VTE. He was also started on Eliquis 5 mg twice daily, instructed to start from Saturday and he will need a close follow-up.  He was also instructed to follow-up with neurology.  Patient also found to have A1c of 6.3 which makes him prediabetic.  He needs a close monitoring by PCP and advised a carb modified diet.  Patient has resistant hypertension and on multiple blood pressure meds at home.  Initially held for permissive hypertension, they were restarted on the day of discharge.  Patient has an history of coronary artery disease, troponin was mildly elevated with a flat curve, most likely secondary to demand.  Echocardiogram without any wall motion abnormalities.  He will continue with M. Doerr, aspirin, Toprol and statin.  His renal function remained stable.  He will continue rest of his home meds and follow-up with PCP.  Discharge Diagnoses:  Principal Problem:   Acute ischemic multifocal multiple vascular territories stroke [facility]) Active Problems:   CAD (coronary artery disease)   GAD (generalized anxiety disorder)   CKD (chronic kidney disease), stage III   Essential hypertension   Dizziness   Elevated troponin  Discharge Instructions  Discharge Instructions    Diet - low sodium heart healthy   Complete by: As directed    Discharge instructions   Complete by: As directed    It was pleasure taking care of you. You were started on few new medications which include a baby aspirin, Crestor and Eliquis.  You will start Eliquis from Saturday, it is a blood thinner. As we discussed it is important  that you follow-up with your cardiologist to get an event monitor to see if you are having any  abnormal rhythm. You need to follow-up with your neurologist. Some of your labs results are pending, if anything become positive we will give you a call, otherwise your primary care physician or specialist should be able to access those results and discuss with you. If all labs are negative, please go for your colonoscopy and will need a oncologic evaluation.   Increase activity slowly   Complete by: As directed      Allergies as of 01/17/2020      Reactions   Penicillins Anaphylaxis   Has patient had a PCN reaction causing immediate rash, facial/tongue/throat swelling, SOB or lightheadedness with hypotension: Yes Has patient had a PCN reaction causing severe rash involving mucus membranes or skin necrosis: Yes Has patient had a PCN reaction that required hospitalization Yes Has patient had a PCN reaction occurring within the last 10 years: No If all of the above answers are "NO", then may proceed with Cephalosporin use.   Ibuprofen Other (See Comments)   Gi upset      Medication List    TAKE these medications   apixaban 5 MG Tabs tablet Commonly known as: ELIQUIS Take 1 tablet (5 mg total) by mouth 2 (two) times daily. Start taking on: Jan 19, 2020   aspirin 81 MG EC tablet Take 1 tablet (81 mg total) by mouth daily. Start taking on: Jan 18, 2020   chlorthalidone 25 MG tablet Commonly known as: HYGROTON Take 25 mg by mouth daily.   cloNIDine 0.3 MG tablet Commonly known as: CATAPRES Take 1 tablet (0.3 mg total) by mouth 2 (two) times daily.   enalapril 20 MG tablet Commonly known as: VASOTEC Take 1 tablet (20 mg total) by mouth daily. Start taking on: Jan 18, 2020   isosorbide mononitrate 30 MG 24 hr tablet Commonly known as: IMDUR Take 30 mg by mouth daily.   metoprolol succinate 100 MG 24 hr tablet Commonly known as: TOPROL-XL Take 1 tablet (100 mg total) by mouth daily.   rosuvastatin 20 MG tablet Commonly known as: CRESTOR Take 1 tablet (20 mg total) by  mouth daily. Start taking on: Jan 18, 2020      Follow-up Information    [name], NP. Schedule an appointment as soon as possible for a visit.   Specialty: Family Medicine Contact information: [place] [address] [phone]        Dionisio David, MD. Schedule an appointment as soon as possible for a visit.   Specialty: Cardiology Contact information: [place] [address] [phone]        Vladimir Crofts, MD. Schedule an appointment as soon as possible for a visit.   Specialty: Neurology Contact information: [place] [facility] [place] [address] [phone]          Allergies  Allergen Reactions  . Penicillins Anaphylaxis    Has patient had a PCN reaction causing immediate rash, facial/tongue/throat swelling, SOB or lightheadedness with hypotension: Yes Has patient had a PCN reaction causing severe rash involving mucus membranes or skin necrosis: Yes Has patient had a PCN reaction that required hospitalization Yes Has patient had a PCN reaction occurring within the last 10 years: No If all of the above answers are "NO", then may proceed with Cephalosporin use.   . Ibuprofen Other (See Comments)    Gi upset  Consultations:  Neurology  Cardiology  Procedures/Studies: CT Angio Head W or Wo Contrast  Result Date: 01/15/2020 CLINICAL DATA:  Sudden onset headache and dizziness, history of aneurysm EXAM: CT ANGIOGRAPHY HEAD TECHNIQUE: Multidetector CT imaging of the head was performed using the standard protocol during bolus administration of intravenous contrast. Multiplanar CT image reconstructions and MIPs were obtained to evaluate the vascular anatomy. CONTRAST:  106m OMNIPAQUE IOHEXOL 350 MG/ML SOLN COMPARISON:  None. FINDINGS: CT HEAD Brain: There is no acute intracranial hemorrhage, mass effect, or edema. Gray-white differentiation is preserved. Patchy and confluent areas  of hypoattenuation in the supratentorial white matter nonspecific but may reflect advanced chronic microvascular ischemic changes. Chronic right frontal infarct. There is left cerebellar encephalomalacia underlying craniotomy. There is no extra-axial fluid collection. Prominence of the ventricles and sulci reflects minor generalized parenchymal volume loss. Vascular: There is atherosclerotic calcification at the skull base. Skull: Small left lateral suboccipital craniotomy. Sinuses/Orbits: No acute finding. Other: None. CTA HEAD Anterior circulation: Intracranial internal carotid arteries are patent with calcified plaque causing mild stenosis on the right and up to moderate stenosis on the left. Anterior and middle cerebral arteries are patent. Posterior circulation: Intracranial vertebral arteries are patent. There is calcified and noncalcified plaque causing mild to moderate stenosis. Basilar artery is patent. Posterior cerebral arteries are patent. Venous sinuses: Not well evaluated. IMPRESSION: No acute intracranial abnormality.  Chronic findings detailed above. No aneurysm identified. Electronically Signed   By: PMacy MisM.D.   On: 01/15/2020 12:14   MR Brain Wo Contrast (neuro protocol)  Result Date: 01/15/2020 CLINICAL DATA:  64year old male with sudden onset headache and dizziness. History of left suboccipital craniotomy. EXAM: MRI HEAD WITHOUT CONTRAST TECHNIQUE: Multiplanar, multiecho pulse sequences of the brain and surrounding structures were obtained without intravenous contrast. COMPARISON:  CTA head and neck earlier today.  Head CT 06/23/2013. FINDINGS: Brain: There are several small linear foci of restricted diffusion in the right cerebellar hemisphere (series 5). Associated T2 and FLAIR hyperintensity with no definite acute hemorrhage and no mass effect. Furthermore there are 4-5 similar small foci of restricted diffusion in both cerebral hemispheres, more numerous on the right and  including right basal ganglia and mesial temporal lobe involvement. No mass effect or definite acute hemorrhage. However, extensive chronic microhemorrhages scattered in the bilateral cerebellum, superimposed on more postoperative appearing hemosiderin and encephalomalacia in the left cerebellar hemisphere underlying the craniotomy. Chronic microhemorrhages are occasionally noted in the brainstem. Multiple similar chronic microhemorrhages in the bilateral deep gray matter nuclei, and occasionally elsewhere in the cerebral hemispheres. There is a small area of cortical encephalomalacia with hemosiderin in the anterior superior right frontal gyrus. Superimposed widespread patchy bilateral cerebral white matter T2 and FLAIR hyperintensity, and T2 heterogeneity throughout the deep gray matter nuclei. No other cortical encephalomalacia. But there is also focal encephalomalacia of the body of the corpus callosum. No midline shift, mass effect, evidence of mass lesion, ventriculomegaly, extra-axial collection or acute intracranial hemorrhage. Cervicomedullary junction and pituitary are within normal limits. Vascular: Major intracranial vascular flow voids are preserved, with generalized intracranial artery tortuosity. Skull and upper cervical spine: Negative visible cervical spine and bone marrow signal. Left suboccipital craniotomy better demonstrated by CT. Sinuses/Orbits: Negative orbits. Mild paranasal sinus mucosal thickening. Other: Visible internal auditory structures appear normal. Mastoids are clear. Mild left suboccipital scalp soft tissue scarring. IMPRESSION: 1. Positive for approximately 9 small foci of acute ischemia scattered in both cerebral hemispheres and the right cerebellar hemisphere. No acute hemorrhage or  mass effect. This pattern is most suspicious for recent embolic event from the heart or proximal aorta, although synchronous small vessel disease may also be possible (see #2). 2. Numerous chronic  micro-hemorrhages in the cerebellum, also the brainstem, the deep gray nuclei. Comparatively little involvement of the cerebral hemispheres, arguing against amyloid angiopathy. Small chronic cortical infarct in the right superior frontal gyrus with hemosiderin. Extensive bilateral white matter and deep gray nuclei signal heterogeneity, and focal encephalomalacia in the body of the corpus callosum. 3. Superimposed chronic postoperative changes to the left cerebellar hemisphere with encephalomalacia. Electronically Signed   By: Genevie Ann M.D.   On: 01/15/2020 20:57   US Carotid Bilateral (at [facility] and [facility] only)  Result Date: 01/16/2020 CLINICAL DATA:  CVA.  History CAD and hypertension. EXAM: BILATERAL CAROTID DUPLEX ULTRASOUND TECHNIQUE: Pearline Cables scale imaging, color Doppler and duplex ultrasound were performed of bilateral carotid and vertebral arteries in the neck. COMPARISON:  None. FINDINGS: Criteria: Quantification of carotid stenosis is based on velocity parameters that correlate the residual internal carotid diameter with NASCET-based stenosis levels, using the diameter of the distal internal carotid lumen as the denominator for stenosis measurement. The following velocity measurements were obtained: RIGHT ICA: 60/19 cm/sec CCA: 16/10 cm/sec SYSTOLIC ICA/CCA RATIO:  1.1 ECA: 70 cm/sec LEFT ICA: 65/22 cm/sec CCA: 96/04 cm/sec SYSTOLIC ICA/CCA RATIO:  1.0 ECA: 57 cm/sec RIGHT CAROTID ARTERY: There is a minimal amount of circumferential intimal thickening throughout the right common carotid artery (images 3, 7 and 11). There is a minimal amount of eccentric mixed echogenic plaque within the right carotid bulb (image 17), extending to involve the origin and proximal aspects of the right internal carotid artery (image 23), not resulting in elevated peak systolic velocities within the interrogated course of the right internal carotid artery to suggest a hemodynamically significant stenosis. RIGHT VERTEBRAL ARTERY:   Antegrade flow LEFT CAROTID ARTERY: There is a minimal amount of atherosclerotic plaque involving the proximal aspects of the left internal carotid artery (image 35). There is a minimal amount of intimal thickening involving the mid (image 39) and distal (image 43) aspects of the left common carotid artery. There is a minimal amount of intimal thickening involving the left carotid bulb, not resulting in elevated peak systolic velocities within the interrogated course of the left internal carotid artery to suggest a hemodynamically significant stenosis. LEFT VERTEBRAL ARTERY:  Antegrade flow IMPRESSION: Minimal amount of bilateral atherosclerotic plaque, not resulting in a hemodynamically significant stenosis within either internal carotid artery. Electronically Signed   By: Sandi Mariscal M.D.   On: 01/16/2020 09:03   US Venous Img Lower Bilateral (DVT)  Result Date: 01/16/2020 CLINICAL DATA:  Acute ischemia involving multiple vascular territories. EXAM: BILATERAL LOWER EXTREMITY VENOUS DOPPLER ULTRASOUND TECHNIQUE: Gray-scale sonography with graded compression, as well as color Doppler and duplex ultrasound were performed to evaluate the lower extremity deep venous systems from the level of the common femoral vein and including the common femoral, femoral, profunda femoral, popliteal and calf veins including the posterior tibial, peroneal and gastrocnemius veins when visible. The superficial great saphenous vein was also interrogated. Spectral Doppler was utilized to evaluate flow at rest and with distal augmentation maneuvers in the common femoral, femoral and popliteal veins. COMPARISON:  None. FINDINGS: RIGHT LOWER EXTREMITY Common Femoral Vein: No evidence of thrombus. Normal compressibility, respiratory phasicity and response to augmentation. Saphenofemoral Junction: No evidence of thrombus. Normal compressibility and flow on color Doppler imaging. Profunda Femoral Vein: No evidence of thrombus. Normal  compressibility and flow on color Doppler imaging. Femoral Vein: While the proximal and mid aspects of the femoral vein are patent, there is occlusive thrombus noted within one of the duplicated distal aspects of the right femoral vein (images 7, 9 and 28). Popliteal Vein: No evidence of thrombus. Normal compressibility, respiratory phasicity and response to augmentation. Calf Veins: No evidence of thrombus. Normal compressibility and flow on color Doppler imaging. Superficial Great Saphenous Vein: No evidence of thrombus. Normal compressibility. Other Findings:  None. LEFT LOWER EXTREMITY Common Femoral Vein: No evidence of thrombus. Normal compressibility, respiratory phasicity and response to augmentation. Saphenofemoral Junction: No evidence of thrombus. Normal compressibility and flow on color Doppler imaging. Profunda Femoral Vein: There is non-occlusive wall thickening/chronic DVT involving the imaged proximal aspect of the left deep femoral vein (images 42 and 47). Femoral Vein: No evidence of thrombus. Normal compressibility, respiratory phasicity and response to augmentation. Popliteal Vein: There is non-occlusive wall thickening/chronic DVT involving the distal aspect of the left popliteal vein (images 62 and 63). Calf Veins: No evidence of thrombus. Normal compressibility and flow on color Doppler imaging. Superficial Great Saphenous Vein: No evidence of thrombus. Normal compressibility. Venous Reflux:  None. Other Findings:  None. IMPRESSION: 1. Age indeterminate occlusive thrombus within the distal aspect of the partially duplicated right femoral vein - while potentially chronic in etiology, in the absence of previous examinations, an acute on chronic process is not excluded. 2. Non-occlusive wall thickening/chronic DVT involving the left deep femoral and popliteal veins. Electronically Signed   By: Sandi Mariscal M.D.   On: 01/16/2020 08:16   ECHO TEE  Result Date: 01/17/2020    TRANSESOPHOGEAL ECHO  REPORT   Patient Name:   TEMPLE SPORER [facility] Date of Exam: 01/17/2020 Medical Rec #:  [id number]         Height:       68.0 in Accession #:    [id number]        Weight:       222.0 lb Date of Birth:  November 19, 1955         BSA:          2.136 m Patient Age:    64 years          BP:           168/103 mmHg Patient Gender: M                 HR:           85 bpm. Exam Location:  [facility] Procedure: Transesophageal Echo, Cardiac Doppler, Color Doppler and Saline            Contrast Bubble Study Indications:     None listed  History:         Patient has prior history of Echocardiogram examinations, most                  recent 01/16/2020. Previous Myocardial Infarction and CAD,                  Signs/Symptoms:Chest Pain; Risk Factors:Hypertension.  Sonographer:     Sherrie Sport RDCS (AE) Referring Phys:  [place] Diagnosing Phys: Neoma Laming MD PROCEDURE: The transesophogeal probe was passed without difficulty through the esophogus of the patient. Sedation performed by performing physician. The patient's vital signs; including heart rate, blood pressure, and oxygen saturation; remained stable throughout the procedure. The patient developed no complications during the procedure. IMPRESSIONS  1. Left ventricular ejection fraction,  by estimation, is 60 to 65%. The left ventricle has normal function. The left ventricle has no regional wall motion abnormalities. There is severe concentric left ventricular hypertrophy of the apical segment.  2. Right ventricular systolic function is normal. The right ventricular size is normal.  3. Left atrial size was mildly dilated. No left atrial/left atrial appendage thrombus was detected.  4. Right atrial size was mildly dilated.  5. The mitral valve is normal in structure. Mild mitral valve regurgitation. No evidence of mitral stenosis.  6. The aortic valve is normal in structure. Aortic valve regurgitation is mild. No aortic stenosis is present.  7. The inferior vena cava is normal in size  with greater than 50% respiratory variability, suggesting right atrial pressure of 3 mmHg.  8. Agitated saline contrast bubble study was negative, with no evidence of any interatrial shunt. Conclusion(s)/Recommendation(s): Normal biventricular function without evidence of hemodynamically significant valvular heart disease. No source of thrombii seen. FINDINGS  Left Ventricle: Left ventricular ejection fraction, by estimation, is 60 to 65%. The left ventricle has normal function. The left ventricle has no regional wall motion abnormalities. The left ventricular internal cavity size was normal in size. There is  severe concentric left ventricular hypertrophy of the apical segment. Right Ventricle: The right ventricular size is normal. No increase in right ventricular wall thickness. Right ventricular systolic function is normal. Left Atrium: Left atrial size was mildly dilated. No left atrial/left atrial appendage thrombus was detected. Right Atrium: Right atrial size was mildly dilated. Pericardium: There is no evidence of pericardial effusion. Mitral Valve: The mitral valve is normal in structure. Normal mobility of the mitral valve leaflets. Mild mitral valve regurgitation. No evidence of mitral valve stenosis. Tricuspid Valve: The tricuspid valve is normal in structure. Tricuspid valve regurgitation is trivial. No evidence of tricuspid stenosis. Aortic Valve: The aortic valve is normal in structure. Aortic valve regurgitation is mild. No aortic stenosis is present. Pulmonic Valve: The pulmonic valve was normal in structure. Pulmonic valve regurgitation is trivial. No evidence of pulmonic stenosis. Aorta: The aortic root is normal in size and structure. Venous: The inferior vena cava is normal in size with greater than 50% respiratory variability, suggesting right atrial pressure of 3 mmHg. IAS/Shunts: No atrial level shunt detected by color flow Doppler. Agitated saline contrast was given intravenously to evaluate  for intracardiac shunting. Agitated saline contrast bubble study was negative, with no evidence of any interatrial shunt. There  is no evidence of an atrial septal defect. Neoma Laming MD Electronically signed by Neoma Laming MD Signature Date/Time: 01/17/2020/9:18:49 AM    Final    ECHOCARDIOGRAM COMPLETE BUBBLE STUDY  Result Date: 01/16/2020    ECHOCARDIOGRAM REPORT   Patient Name:   SYE SCHROEPFER [facility] Date of Exam: 01/16/2020 Medical Rec #:  [id number]         Height:       68.0 in Accession #:    [id number]        Weight:       222.0 lb Date of Birth:  [birth year]         BSA:          2.136 m Patient Age:    85 years          BP:           184/98 mmHg Patient Gender: M                 HR:  72 bpm. Exam Location:  [facility] Procedure: 2D Echo, Saline Contrast Bubble Study, Cardiac Doppler and Color            Doppler Indications:     Stroke 434.91  History:         Patient has prior history of Echocardiogram examinations, most                  recent 01/22/2017. CAD, Signs/Symptoms:Chest Pain; Risk                  Factors:Hypertension. MI.  Sonographer:     Sherrie Sport RDCS (AE) Referring Phys:  [id number] [place] Corin PATEL Diagnosing Phys: Kate Sable MD IMPRESSIONS  1. Left ventricular ejection fraction, by estimation, is 55 to 60%. The left ventricle has normal function. The left ventricle has no regional wall motion abnormalities. There is mild left ventricular hypertrophy. Left ventricular diastolic parameters are consistent with Grade I diastolic dysfunction (impaired relaxation).  2. Right ventricular systolic function is normal. The right ventricular size is normal.  3. Left atrial size was moderately dilated.  4. The mitral valve is grossly normal. No evidence of mitral valve regurgitation.  5. The aortic valve is tricuspid. Aortic valve regurgitation is trivial.  6. Agitated saline contrast bubble study was negative, with no evidence of any interatrial shunt. FINDINGS  Left Ventricle: Left  ventricular ejection fraction, by estimation, is 55 to 60%. The left ventricle has normal function. The left ventricle has no regional wall motion abnormalities. The left ventricular internal cavity size was normal in size. There is  mild left ventricular hypertrophy. Left ventricular diastolic parameters are consistent with Grade I diastolic dysfunction (impaired relaxation). Right Ventricle: The right ventricular size is normal. No increase in right ventricular wall thickness. Right ventricular systolic function is normal. Left Atrium: Left atrial size was moderately dilated. Right Atrium: Right atrial size was normal in size. Pericardium: There is no evidence of pericardial effusion. Mitral Valve: The mitral valve is grossly normal. Mild to moderate mitral annular calcification. No evidence of mitral valve regurgitation. Tricuspid Valve: The tricuspid valve is grossly normal. Tricuspid valve regurgitation is not demonstrated. Aortic Valve: The aortic valve is tricuspid. Aortic valve regurgitation is trivial. Aortic regurgitation PHT measures 650 msec. Aortic valve mean gradient measures 5.0 mmHg. Aortic valve peak gradient measures 8.4 mmHg. Aortic valve area, by VTI measures  3.82 cm. Pulmonic Valve: The pulmonic valve was not well visualized. Pulmonic valve regurgitation is not visualized. Aorta: The aortic root is normal in size and structure. Venous: The inferior vena cava was not well visualized. IAS/Shunts: No atrial level shunt detected by color flow Doppler. Agitated saline contrast was given intravenously to evaluate for intracardiac shunting. Agitated saline contrast bubble study was negative, with no evidence of any interatrial shunt.  LEFT VENTRICLE PLAX 2D LVIDd:         3.53 cm  Diastology LVIDs:         2.55 cm  LV e' lateral:   4.46 cm/s LV PW:         1.47 cm  LV E/e' lateral: 15.8 LV IVS:        1.96 cm  LV e' medial:    4.68 cm/s LVOT diam:     2.20 cm  LV E/e' medial:  15.0 LV SV:         102  LV SV Index:   48 LVOT Area:     3.80 cm  RIGHT VENTRICLE RV S prime:  16.00 cm/s TAPSE (M-mode): 4.2 cm LEFT ATRIUM              Index       RIGHT ATRIUM           Index LA diam:        4.20 cm  1.97 cm/m  RA Area:     15.20 cm LA Vol (A2C):   124.0 ml 58.04 ml/m RA Volume:   33.40 ml  15.63 ml/m LA Vol (A4C):   87.8 ml  41.10 ml/m LA Biplane Vol: 105.0 ml 49.15 ml/m  AORTIC VALVE                    PULMONIC VALVE AV Area (Vmax):    3.17 cm     PV Vmax:        0.76 m/s AV Area (Vmean):   3.02 cm     PV Peak grad:   2.3 mmHg AV Area (VTI):     3.82 cm     RVOT Peak grad: 2 mmHg AV Vmax:           145.00 cm/s AV Vmean:          104.000 cm/s AV VTI:            0.266 m AV Peak Grad:      8.4 mmHg AV Mean Grad:      5.0 mmHg LVOT Vmax:         121.00 cm/s LVOT Vmean:        82.700 cm/s LVOT VTI:          0.268 m LVOT/AV VTI ratio: 1.01 AI PHT:            650 msec  AORTA Ao Root diam: 3.40 cm MITRAL VALVE                TRICUSPID VALVE MV Area (PHT): 2.48 cm     TR Peak grad:   15.1 mmHg MV Decel Time: 306 msec     TR Vmax:        194.00 cm/s MV E velocity: 70.30 cm/s MV A velocity: 117.00 cm/s  SHUNTS MV E/A ratio:  0.60         Systemic VTI:  0.27 m                             Systemic Diam: 2.20 cm Kate Sable MD Electronically signed by Kate Sable MD Signature Date/Time: 01/16/2020/12:25:48 PM    Final     Subjective: Patient has no new complaints when seen today.  He was accompanied by his son in the room.  Had his TEE earlier this morning, tolerated the procedure well.  Discharge Exam: Vitals:   01/17/20 1100 01/17/20 1413  BP:  (!) 155/116  Pulse:  91  Resp: 18 20  Temp:  98.7 F (37.1 C)  SpO2:  97%   Vitals:   01/17/20 1000 01/17/20 1030 01/17/20 1100 01/17/20 1413  BP:  (!) 194/135  (!) 155/116  Pulse: 100 86  91  Resp: _0 Temp: 98.6 F (37 C)   98.7 F (37.1 C)  TempSrc:    Oral  SpO2: 98% 98%  97%  Weight:      Height:        General: Pt is  alert, awake, not in acute distress Cardiovascular: RRR, S1/S2 +, no rubs, no gallops Respiratory: CTA  bilaterally, no wheezing, no rhonchi Abdominal: Soft, NT, ND, bowel sounds + Extremities: no edema, no cyanosis   The results of significant diagnostics from this hospitalization (including imaging, microbiology, ancillary and laboratory) are listed below for reference.    Microbiology: Recent Results (from the past 240 hour(s))  SARS Coronavirus 2 by RT PCR (hospital order, performed in [facility] hospital lab) Nasopharyngeal Nasopharyngeal Swab     Status: None   Collection Time: 01/15/20  6:55 PM   Specimen: Nasopharyngeal Swab  Result Value Ref Range Status   SARS Coronavirus 2 NEGATIVE NEGATIVE Final    Comment: (NOTE) SARS-CoV-2 target nucleic acids are NOT DETECTED. The SARS-CoV-2 RNA is generally detectable in upper and lower respiratory specimens during the acute phase of infection. The lowest concentration of SARS-CoV-2 viral copies this assay can detect is 250 copies / mL. A negative result does not preclude SARS-CoV-2 infection and should not be used as the sole basis for treatment or other patient management decisions.  A negative result may occur with improper specimen collection / handling, submission of specimen other than nasopharyngeal swab, presence of viral mutation(s) within the areas targeted by this assay, and inadequate number of viral copies (<250 copies / mL). A negative result must be combined with clinical observations, patient history, and epidemiological information. Fact Sheet for Patients:   StrictlyIdeas.no Fact Sheet for Healthcare Providers: BankingDealers.co.za This test is not yet approved or cleared  by the [place] FDA and has been authorized for detection and/or diagnosis of SARS-CoV-2 by FDA under an Emergency Use Authorization (EUA).  This EUA will remain in effect (meaning this test can  be used) for the duration of the COVID-19 declaration under Section 564(b)(1) of the Act, 21 U.S.C. section 360bbb-3(b)(1), unless the authorization is terminated or revoked sooner. Performed at [facility], [address] [place], [place] [address]      Labs: BNP (last 3 results) No results for input(s): BNP in the last 8760 hours. Basic Metabolic Panel: Recent Labs  Lab 01/15/20 1059 01/15/20 2258  NA 136 138  K 3.8 3.6  CL 101 103  CO2 27 26  GLUCOSE 180* 114*  BUN 21 21  CREATININE 1.38* 1.28*  CALCIUM 9.5 9.2   Liver Function Tests: No results for input(s): AST, ALT, ALKPHOS, BILITOT, PROT, ALBUMIN in the last 168 hours. No results for input(s): LIPASE, AMYLASE in the last 168 hours. No results for input(s): AMMONIA in the last 168 hours. CBC: Recent Labs  Lab 01/15/20 1059 01/15/20 2258  WBC 7.5 8.0  HGB 14.6 14.3  HCT 42.9 42.7  MCV 87.7 88.4  PLT 245 218   Cardiac Enzymes: No results for input(s): CKTOTAL, CKMB, CKMBINDEX, TROPONINI in the last 168 hours. BNP: Invalid input(s): POCBNP CBG: No results for input(s): GLUCAP in the last 168 hours. D-Dimer No results for input(s): DDIMER in the last 72 hours. Hgb A1c Recent Labs    01/16/20 0055  HGBA1C 6.3*   Lipid Profile Recent Labs    01/16/20 0055  CHOL 148  HDL 36*  LDLCALC 92  TRIG 99  CHOLHDL 4.1   Thyroid function studies No results for input(s): TSH, T4TOTAL, T3FREE, THYROIDAB in the last 72 hours.  Invalid input(s): FREET3 Anemia work up No results for input(s): VITAMINB12, FOLATE, FERRITIN, TIBC, IRON, RETICCTPCT in the last 72 hours. Urinalysis    Component Value Date/Time   COLORURINE YELLOW (A) 01/15/2020 1813   APPEARANCEUR CLEAR (A) 01/15/2020 1813   APPEARANCEUR Clear 08/14/2019  0831   LABSPEC 1.040 (H) 01/15/2020 1813   LABSPEC 1.035 05/01/2014 0939   PHURINE 6.0 01/15/2020 [place] 01/15/2020 1813   GLUCOSEU Negative 05/01/2014 0939    HGBUR NEGATIVE 01/15/2020 1813   BILIRUBINUR NEGATIVE 01/15/2020 1813   BILIRUBINUR Negative 08/14/2019 0831   BILIRUBINUR Negative 05/01/2014 0939   KETONESUR NEGATIVE 01/15/2020 1813   PROTEINUR NEGATIVE 01/15/2020 1813   NITRITE NEGATIVE 01/15/2020 1813   LEUKOCYTESUR NEGATIVE 01/15/2020 1813   LEUKOCYTESUR Negative 05/01/2014 0939   Sepsis Labs Invalid input(s): PROCALCITONIN,  WBC,  LACTICIDVEN Microbiology Recent Results (from the past 240 hour(s))  SARS Coronavirus 2 by RT PCR (hospital order, performed in [place] hospital lab) Nasopharyngeal Nasopharyngeal Swab     Status: None   Collection Time: 01/15/20  6:55 PM   Specimen: Nasopharyngeal Swab  Result Value Ref Range Status   SARS Coronavirus 2 NEGATIVE NEGATIVE Final    Comment: (NOTE) SARS-CoV-2 target nucleic acids are NOT DETECTED. The SARS-CoV-2 RNA is generally detectable in upper and lower respiratory specimens during the acute phase of infection. The lowest concentration of SARS-CoV-2 viral copies this assay can detect is 250 copies / mL. A negative result does not preclude SARS-CoV-2 infection and should not be used as the sole basis for treatment or other patient management decisions.  A negative result may occur with improper specimen collection / handling, submission of specimen other than nasopharyngeal swab, presence of viral mutation(s) within the areas targeted by this assay, and inadequate number of viral copies (<250 copies / mL). A negative result must be combined with clinical observations, patient history, and epidemiological information. Fact Sheet for Patients:   StrictlyIdeas.no Fact Sheet for Healthcare Providers: BankingDealers.co.za This test is not yet approved or cleared  by the [place] FDA and has been authorized for detection and/or diagnosis of SARS-CoV-2 by FDA under an Emergency Use Authorization (EUA).  This EUA will remain in  effect (meaning this test can be used) for the duration of the COVID-19 declaration under Section 564(b)(1) of the Act, 21 U.S.C. section 360bbb-3(b)(1), unless the authorization is terminated or revoked sooner. Performed at [facility], [address] [place], [place] [address]     Time coordinating discharge: Over 30 minutes  SIGNED:  Lorella Nimrod, MD  [facility] 01/17/2020, 2:29 PM  If 7PM-7AM, please contact night-coverage [email or web address]  This record has been created using Systems analyst. Errors have been sought and corrected,but may not always be located. Such creation errors do not reflect on the standard of care.

## 2020-01-18 ENCOUNTER — Other Ambulatory Visit: Payer: Self-pay | Admitting: Adult Health

## 2020-01-18 ENCOUNTER — Telehealth: Payer: Self-pay

## 2020-01-18 ENCOUNTER — Other Ambulatory Visit: Payer: Self-pay

## 2020-01-18 ENCOUNTER — Ambulatory Visit (INDEPENDENT_AMBULATORY_CARE_PROVIDER_SITE_OTHER): Payer: Medicare Other | Admitting: Nurse Practitioner

## 2020-01-18 VITALS — BP 95/70 | HR 54 | Temp 93.3°F | Resp 16 | Ht 68.0 in | Wt 219.0 lb

## 2020-01-18 DIAGNOSIS — Z09 Encounter for follow-up examination after completed treatment for conditions other than malignant neoplasm: Secondary | ICD-10-CM

## 2020-01-18 DIAGNOSIS — E782 Mixed hyperlipidemia: Secondary | ICD-10-CM

## 2020-01-18 DIAGNOSIS — I2583 Coronary atherosclerosis due to lipid rich plaque: Secondary | ICD-10-CM

## 2020-01-18 DIAGNOSIS — I2510 Atherosclerotic heart disease of native coronary artery without angina pectoris: Secondary | ICD-10-CM

## 2020-01-18 DIAGNOSIS — F411 Generalized anxiety disorder: Secondary | ICD-10-CM

## 2020-01-18 DIAGNOSIS — I639 Cerebral infarction, unspecified: Secondary | ICD-10-CM

## 2020-01-18 DIAGNOSIS — K257 Chronic gastric ulcer without hemorrhage or perforation: Secondary | ICD-10-CM

## 2020-01-18 LAB — CARDIOLIPIN ANTIBODIES, IGG, IGM, IGA
Anticardiolipin IgA: <9 APL U/mL (ref 0–11)
Anticardiolipin IgG: <9 GPL U/mL (ref 0–14)
Anticardiolipin IgM: <9 MPL U/mL (ref 0–12)

## 2020-01-18 MED ORDER — CLOPIDOGREL BISULFATE 75 MG PO TABS: 75.0000 mg | ORAL_TABLET | Freq: Every day | ORAL | 3 refills | Status: DC

## 2020-01-18 MED ORDER — BUSPIRONE HCL 10 MG PO TABS: 10.0000 mg | ORAL_TABLET | Freq: Three times a day (TID) | ORAL | 3 refills | Status: DC

## 2020-01-18 MED ORDER — PANTOPRAZOLE SODIUM 40 MG PO TBEC: 40.0000 mg | DELAYED_RELEASE_TABLET | Freq: Every day | ORAL | 3 refills | Status: DC

## 2020-01-18 NOTE — Progress Notes (Signed)
[facility] [address] [place], [place] [address]  Internal MEDICINE  Office Visit Note  Patient Name: Howard Weaver  [id number]  [id number]  Date of Service: 01/20/2020   Transitional care after hospitalization.  Pt is here for recent hospital follow up.   Chief Complaint  Patient presents with  . Hospitalization Follow-up    stroke, blood clots in legs, anxiety  . Hypertension     The patient is here for hospital follow.up. he was hospitalized from 01/15/2020 through 01/17/2020 after suffering a stroke. MRI of the brain the day he was admitted showed  1. Positive for approximately 9 small foci of acute ischemia scattered in both cerebral hemispheres and the right cerebellar hemisphere. No acute hemorrhage or mass effect. This pattern is most suspicious for recent embolic event from the heart or proximal aorta, although synchronous small vessel disease may also be possible (see #2).  2. Numerous chronic micro-hemorrhages in the cerebellum, also the brainstem, the deep gray nuclei. Comparatively little involvement of the cerebral hemispheres, arguing against amyloid angiopathy. Small chronic cortical infarct in the right superior frontal gyrus with hemosiderin. Extensive bilateral white matter and deep gray nuclei signal heterogeneity, and focal encephalomalacia in the body of the corpus callosum.  3. Superimposed chronic postoperative changes to the left cerebellar hemisphere with encephalomalacia.  He states that he had stroke shortly after receiving second COVID 19 vaccine. The second vaccine was given 11/18/2019.  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 He was started on eliquis. He is unable to afford this medication. He is to follow up with Dr. Adrian Blackwater and with Dr. Sherryll Burger, neurology, who is requiring a referral from PCP.  Since the stroke, he ha weakness and shakiness and very unsteady on his feet. He is to start physical therapy at the  hospital in the near future.  The patient is also having a lot of anxiety since the stroke. He used to be on buspirone two to three times daily, however, this had been stopped. The patient's wife reports increased anxiety and irritability. States he is very short fused at this time. Would like to be started on some sort of anti-anxiety/anti-depressant medication.    Current Medication: Outpatient Encounter Medications as of 01/18/2020  Medication Sig Note  . aspirin EC 81 MG EC tablet Take 1 tablet (81 mg total) by mouth daily.   . chlorthalidone (HYGROTON) 25 MG tablet Take 25 mg by mouth daily.   . cloNIDine (CATAPRES) 0.3 MG tablet Take 1 tablet (0.3 mg total) by mouth 2 (two) times daily.   . enalapril (VASOTEC) 20 MG tablet Take 1 tablet (20 mg total) by mouth daily.   . isosorbide mononitrate (IMDUR) 30 MG 24 hr tablet Take 30 mg by mouth daily.   . metoprolol succinate (TOPROL-XL) 100 MG 24 hr tablet Take 1 tablet (100 mg total) by mouth daily.   . rosuvastatin (CRESTOR) 20 MG tablet Take 1 tablet (20 mg total) by mouth daily.   . [DISCONTINUED] apixaban (ELIQUIS) 5 MG TABS tablet Take 1 tablet (5 mg total) by mouth 2 (two) times daily. 01/18/2020: change to plavix  . busPIRone (BUSPAR) 10 MG tablet Take 1 tablet (10 mg total) by mouth 3 (three) times daily.   . clopidogrel (PLAVIX) 75 MG tablet Take 1 tablet (75 mg total) by mouth daily.   . pantoprazole (PROTONIX) 40 MG tablet Take 1 tablet (40 mg total) by mouth daily.   . [DISCONTINUED] pantoprazole (PROTONIX) 40 MG  tablet TAKE 1 TABLET BY MOUTH TWICE DAILY (Patient not taking: Reported on 01/18/2020)    No facility-administered encounter medications on file as of 01/18/2020.    Surgical History: Past Surgical History:  Procedure Laterality Date  . BRAIN SURGERY    . CARDIAC CATHETERIZATION Right 11/25/2015   Procedure: Left Heart Cath and Coronary Angiography;  Surgeon: Laurier Nancy, MD;  Location: [facility] INVASIVE CV LAB;   Service: Cardiovascular;  Laterality: Right;  . TEE WITHOUT CARDIOVERSION Right 01/17/2020   Procedure: TRANSESOPHAGEAL ECHOCARDIOGRAM (TEE);  Surgeon: Laurier Nancy, MD;  Location: [facility] ORS;  Service: Cardiovascular;  Laterality: Right;    Medical History: Past Medical History:  Diagnosis Date  . CAD (coronary artery disease)   . Chest pain   . DU (duodenal ulcer)   . GAD (generalized anxiety disorder)   . Hypertension   . Myocardial infarction (HCC)   . Radiculopathy of thoracolumbar region   . SVT (supraventricular tachycardia) (HCC)   . Tachycardia     Family History: Family History  Problem Relation Age of Onset  . Hypertension Mother   . Diabetes Mother   . Depression Mother   . Hypertension Father     Social History   Socioeconomic History  . Marital status: Married    Spouse name: Not on file  . Number of children: Not on file  . Years of education: Not on file  . Highest education level: Not on file  Occupational History  . Not on file  Tobacco Use  . Smoking status: Former Smoker    Quit date: 11/25/1975    Years since quitting: 44.1  . Smokeless tobacco: Never Used  Substance and Sexual Activity  . Alcohol use: No  . Drug use: No  . Sexual activity: Not on file  Other Topics Concern  . Not on file  Social History Narrative  . Not on file   Social Determinants of Health   Financial Resource Strain:   . Difficulty of Paying Living Expenses:   Food Insecurity:   . Worried About Programme researcher, broadcasting/film/video in the Last Year:   . Barista in the Last Year:   Transportation Needs:   . Freight forwarder (Medical):   Marland Kitchen Lack of Transportation (Non-Medical):   Physical Activity:   . Days of Exercise per Week:   . Minutes of Exercise per Session:   Stress:   . Feeling of Stress :   Social Connections:   . Frequency of Communication with Friends and Family:   . Frequency of Social Gatherings with Friends and Family:   . Attends Religious  Services:   . Active Member of Clubs or Organizations:   . Attends Banker Meetings:   Marland Kitchen Marital Status:   Intimate Partner Violence:   . Fear of Current or Ex-Partner:   . Emotionally Abused:   Marland Kitchen Physically Abused:   . Sexually Abused:       Review of Systems  Constitutional: Positive for fatigue. Negative for chills and unexpected weight change.  HENT: Negative for congestion, postnasal drip, rhinorrhea, sneezing and sore throat.   Respiratory: Negative for cough, chest tightness, shortness of breath and wheezing.   Cardiovascular: Negative for chest pain and palpitations.       Blood pressure running on low side.   Gastrointestinal: Negative for abdominal pain, constipation, diarrhea, nausea and vomiting.  Endocrine: Negative for cold intolerance, heat intolerance, polydipsia and polyuria.  Musculoskeletal: Positive for gait problem and  myalgias. Negative for arthralgias, back pain, joint swelling and neck pain.  Skin: Negative for rash.  Allergic/Immunologic: Negative for environmental allergies.  Neurological: Positive for speech difficulty and weakness. Negative for tremors and numbness.  Hematological: Negative for adenopathy. Does not bruise/bleed easily.  Psychiatric/Behavioral: Positive for dysphoric mood. Negative for behavioral problems (Depression), sleep disturbance and suicidal ideas. The patient is nervous/anxious.     Today's Vitals   01/18/20 1455  BP: 95/70  Pulse: (!) 54  Resp: 16  Temp: (!) 93.3 F (34.1 C)  SpO2: 98%  Weight: 219 lb (99.3 kg)  Height: 5\' 8"  (1.727 m)   Body mass index is 33.3 kg/m.  Physical Exam Vitals and nursing note reviewed.  Constitutional:      General: He is not in acute distress.    Appearance: Normal appearance. He is well-developed. He is not diaphoretic.  HENT:     Head: Normocephalic and atraumatic.     Mouth/Throat:     Pharynx: No oropharyngeal exudate.  Eyes:     Conjunctiva/sclera: Conjunctivae  normal.     Pupils: Pupils are equal, round, and reactive to light.  Neck:     Thyroid: No thyromegaly.     Vascular: No JVD.     Trachea: No tracheal deviation.  Cardiovascular:     Rate and Rhythm: Normal rate and regular rhythm.     Heart sounds: Murmur present. No friction rub. No gallop.   Pulmonary:     Effort: Pulmonary effort is normal. No respiratory distress.     Breath sounds: Normal breath sounds. No wheezing or rales.  Chest:     Chest wall: No tenderness.  Abdominal:     General: Bowel sounds are normal.     Palpations: Abdomen is soft.     Tenderness: There is no abdominal tenderness.  Musculoskeletal:        General: Normal range of motion.     Cervical back: Normal range of motion and neck supple.     Comments: Bilateral upper and lower extremity weakness which has worsened since his most recent visit.   Lymphadenopathy:     Cervical: No cervical adenopathy.  Skin:    General: Skin is warm and dry.  Neurological:     Mental Status: He is alert and oriented to person, place, and time.     Cranial Nerves: No cranial nerve deficit.     Comments: Speech difficulties noted. Able to speak clearly and answer questions appropriately. Does have some slurred type speech.  There is upper and lower extremity weakness, bilaterally. Using a cane to help with ambulation and balance    Psychiatric:        Attention and Perception: Attention and perception normal.        Mood and Affect: Mood is anxious and depressed.        Speech: Speech is delayed.        Behavior: Behavior normal. Behavior is cooperative.        Thought Content: Thought content normal.        Cognition and Memory: Cognition and memory normal.        Judgment: Judgment normal.    Assessment/Plan: 1. Hospital discharge follow-up Patient hospitalized from 01/15/2020 through 01/17/2020 due to CVA effecting both hemispheres of his brain. We reviewed labs, radiology reports, and progress notes from his  hospitalization. Updated his medication list to reflect changes made while hospitalized.   2. Cerebrovascular accident (CVA), unspecified mechanism ([place]) MRI of patient's brain  showed 1. Positive for approximately 9 small foci of acute ischemia scattered in both cerebral hemispheres and the right cerebellar hemisphere. No acute hemorrhage or mass effect. This pattern is most suspicious for recent embolic event from the heart or proximal aorta, although synchronous small vessel disease may also be possible (see #2). 2. Numerous chronic icro-hemorrhages in the cerebellum, also the brainstem, the deep gray nuclei. Comparatively little involvement of the cerebral hemispheres, arguing against amyloid angiopathy. Small chronic cortical infarct in the right superior frontal gyrus with hemosiderin. Extensive bilateral white matter and deep gray nuclei signal heterogeneity, and focal encephalomalacia in the body of the corpus callosum. 3. Superimposed chronic postoperative changes to the left cerebellar hemisphere with encephalomalacia. He does have more severe weakness of bilateral upper and lower extremities. He is set to begin physical therapy at outpatient clinic in near future. Refer to neurology for further evaluation and treatment.  Will start plavix 75mg  daily as he is unable to afford prescribed eliquis. He will discss this with cardiologist at his next visit.  - Ambulatory referral to Neurology - clopidogrel (PLAVIX) 75 MG tablet; Take 1 tablet (75 mg total) by mouth daily.  Dispense: 30 tablet; Refill: 3  3. Coronary artery disease due to lipid rich plaque Patient has history of coronary artery disease. He is followed closely by cardiology.   4. Mixed hyperlipidemia He should continue on crestor as prescribed   5. Peptic ulcer of stomach, chronic Take pantoprazole 40mg  every evening, separate from anticoagulant, which he will take in the mornings.  - pantoprazole (PROTONIX) 40 MG tablet; Take 1  tablet (40 mg total) by mouth daily.  Dispense: 30 tablet; Refill: 3  6. GAD (generalized anxiety disorder) Restart buspirone 10mg  which may be taken up to three times daily as needed for acute anxiety.  - busPIRone (BUSPAR) 10 MG tablet; Take 1 tablet (10 mg total) by mouth 3 (three) times daily.  Dispense: 90 tablet; Refill: 3  General Counseling: Laurie verbalizes understanding of the findings of todays visit and agrees with plan of treatment. I have discussed any further diagnostic evaluation that may be needed or ordered today. We also reviewed his medications today. he has been encouraged to call the office with any questions or concerns that should arise related to todays visit.    Counseling:  Cardiac risk factor modification:  1. Control blood pressure. 2. Exercise as prescribed. 3. Follow low sodium, low fat diet. and low fat and low cholestrol diet. 4. Take ASA 81mg  once a day. 5. Restricted calories diet to lose weight.  This patient was seen by FNP Collaboration with Dr as a part of collaborative care agreement  Orders Placed This Encounter  Procedures  . Ambulatory referral to Neurology      I have reviewed all medical records from hospital follow up including radiology reports and consults from other physicians. Appropriate follow up diagnostics will be scheduled as needed. Patient/ Family understands the plan of treatment. Time spent 45 minutes.   Dr , MD Internal Medicine

## 2020-01-18 NOTE — Telephone Encounter (Signed)
Spoke with phar and put hold for protonix for now as advised we can fill on hospital follow  appt

## 2020-01-20 DIAGNOSIS — Z09 Encounter for follow-up examination after completed treatment for conditions other than malignant neoplasm: Secondary | ICD-10-CM | POA: Insufficient documentation

## 2020-01-24 ENCOUNTER — Telehealth: Payer: Self-pay

## 2020-01-24 NOTE — Telephone Encounter (Signed)
Provider signed rehabilitaion referral for PT. Faxed back to [facility] at [phone]. Copy placed in scan.

## 2020-01-30 ENCOUNTER — Ambulatory Visit: Payer: Medicare Other | Admitting: Nurse Practitioner

## 2020-02-29 ENCOUNTER — Telehealth: Payer: Self-pay

## 2020-02-29 ENCOUNTER — Encounter: Payer: Self-pay | Admitting: Nurse Practitioner

## 2020-02-29 ENCOUNTER — Ambulatory Visit (INDEPENDENT_AMBULATORY_CARE_PROVIDER_SITE_OTHER): Payer: Medicare Other | Admitting: Nurse Practitioner

## 2020-02-29 ENCOUNTER — Other Ambulatory Visit: Payer: Self-pay

## 2020-02-29 VITALS — BP 142/80 | HR 63 | Temp 97.8°F | Resp 16 | Ht 69.0 in | Wt 220.0 lb

## 2020-02-29 DIAGNOSIS — I10 Essential (primary) hypertension: Secondary | ICD-10-CM | POA: Diagnosis not present

## 2020-02-29 DIAGNOSIS — I639 Cerebral infarction, unspecified: Secondary | ICD-10-CM | POA: Diagnosis not present

## 2020-02-29 DIAGNOSIS — F411 Generalized anxiety disorder: Secondary | ICD-10-CM

## 2020-02-29 DIAGNOSIS — I2583 Coronary atherosclerosis due to lipid rich plaque: Secondary | ICD-10-CM

## 2020-02-29 DIAGNOSIS — I2510 Atherosclerotic heart disease of native coronary artery without angina pectoris: Secondary | ICD-10-CM | POA: Diagnosis not present

## 2020-02-29 NOTE — Progress Notes (Signed)
[facility] [address] [place], [place] [address]  Internal MEDICINE  Office Visit Note  Patient Name: Howard Weaver  [id number]  [id number]  Date of Service: 03/08/2020  Chief Complaint  Patient presents with   Follow-up    6 week   Hypertension   Anxiety    The patient is here for routine follow up visit. The patient was started on buspirone 10mg  two to three times daily as needed for acute anxiety. He states that he is doing better with anxiety control since starting on this medication. States that he has had no negative side effects from taking this. He reports no new concerns or complaints. He is seeing neurology and cardiology after suffering from stroke earlier in the year. Making progress increasing strength.       Current Medication: Outpatient Encounter Medications as of 02/29/2020  Medication Sig   aspirin EC 81 MG EC tablet Take 1 tablet (81 mg total) by mouth daily.   busPIRone (BUSPAR) 10 MG tablet Take 1 tablet (10 mg total) by mouth 3 (three) times daily.   chlorthalidone (HYGROTON) 25 MG tablet Take 25 mg by mouth daily.   cloNIDine (CATAPRES) 0.3 MG tablet Take 1 tablet (0.3 mg total) by mouth 2 (two) times daily.   clopidogrel (PLAVIX) 75 MG tablet Take 1 tablet (75 mg total) by mouth daily.   enalapril (VASOTEC) 20 MG tablet Take 1 tablet (20 mg total) by mouth daily.   isosorbide mononitrate (IMDUR) 30 MG 24 hr tablet Take 30 mg by mouth daily.   metoprolol succinate (TOPROL-XL) 100 MG 24 hr tablet Take 1 tablet (100 mg total) by mouth daily.   pantoprazole (PROTONIX) 40 MG tablet Take 1 tablet (40 mg total) by mouth daily.   rosuvastatin (CRESTOR) 20 MG tablet Take 1 tablet (20 mg total) by mouth daily.   No facility-administered encounter medications on file as of 02/29/2020.    Surgical History: Past Surgical History:  Procedure Laterality Date   BRAIN SURGERY     CARDIAC CATHETERIZATION Right 11/25/2015   Procedure:  Left Heart Cath and Coronary Angiography;  Surgeon: 11/27/2015, MD;  Location: [facility] INVASIVE CV LAB;  Service: Cardiovascular;  Laterality: Right;   TEE WITHOUT CARDIOVERSION Right 01/17/2020   Procedure: TRANSESOPHAGEAL ECHOCARDIOGRAM (TEE);  Surgeon: 01/19/2020, MD;  Location: [facility] ORS;  Service: Cardiovascular;  Laterality: Right;    Medical History: Past Medical History:  Diagnosis Date   CAD (coronary artery disease)    Chest pain    DU (duodenal ulcer)    GAD (generalized anxiety disorder)    Hypertension    Myocardial infarction (HCC)    Radiculopathy of thoracolumbar region    SVT (supraventricular tachycardia) (HCC)    Tachycardia     Family History: Family History  Problem Relation Age of Onset   Hypertension Mother    Diabetes Mother    Depression Mother    Hypertension Father     Social History   Socioeconomic History   Marital status: Married    Spouse name: Not on file   Number of children: Not on file   Years of education: Not on file   Highest education level: Not on file  Occupational History   Not on file  Tobacco Use   Smoking status: Former Smoker    Quit date: 11/25/1975    Years since quitting: 44.3   Smokeless tobacco: Never Used  Vaping Use   Vaping Use: Never used  Substance  and Sexual Activity   Alcohol use: No   Drug use: No   Sexual activity: Not on file  Other Topics Concern   Not on file  Social History Narrative   Not on file   Social Determinants of Health   Financial Resource Strain:    Difficulty of Paying Living Expenses:   Food Insecurity:    Worried About Running Out of Food in the Last Year:    Barista in the Last Year:   Transportation Needs:    Freight forwarder (Medical):    Lack of Transportation (Non-Medical):   Physical Activity:    Days of Exercise per Week:    Minutes of Exercise per Session:   Stress:    Feeling of Stress :   Social Connections:     Frequency of Communication with Friends and Family:    Frequency of Social Gatherings with Friends and Family:    Attends Religious Services:    Active Member of Clubs or Organizations:    Attends Engineer, structural:    Marital Status:   Intimate Partner Violence:    Fear of Current or Ex-Partner:    Emotionally Abused:    Physically Abused:    Sexually Abused:       Review of Systems  Constitutional: Positive for fatigue. Negative for activity change, chills and unexpected weight change.  HENT: Negative for congestion, postnasal drip, rhinorrhea, sneezing and sore throat.   Respiratory: Negative for cough, chest tightness, shortness of breath and wheezing.   Cardiovascular: Negative for chest pain and palpitations.       Improved blood pressure today.   Gastrointestinal: Negative for abdominal pain, constipation, diarrhea, nausea and vomiting.  Endocrine: Negative for cold intolerance, heat intolerance, polydipsia and polyuria.  Musculoskeletal: Positive for gait problem and myalgias. Negative for arthralgias, back pain, joint swelling and neck pain.  Skin: Negative for rash.  Allergic/Immunologic: Negative for environmental allergies.  Neurological: Positive for speech difficulty and weakness. Negative for tremors and numbness.       Improved weakness.   Hematological: Negative for adenopathy. Does not bruise/bleed easily.  Psychiatric/Behavioral: Positive for dysphoric mood. Negative for behavioral problems (Depression), sleep disturbance and suicidal ideas. The patient is nervous/anxious.        Improved anxiety/depression.     Today's Vitals   02/29/20 1100  BP: (!) 142/80  Pulse: 63  Resp: 16  Temp: 97.8 F (36.6 C)  Weight: 220 lb (99.8 kg)  Height: 5\' 9"  (1.753 m)   Body mass index is 32.49 kg/m.  Physical Exam Vitals and nursing note reviewed.  Constitutional:      General: He is not in acute distress.    Appearance: Normal  appearance. He is well-developed. He is not diaphoretic.  HENT:     Head: Normocephalic and atraumatic.     Nose: Nose normal.     Mouth/Throat:     Pharynx: No oropharyngeal exudate.  Eyes:     Conjunctiva/sclera: Conjunctivae normal.     Pupils: Pupils are equal, round, and reactive to light.  Neck:     Thyroid: No thyromegaly.     Vascular: No carotid bruit or JVD.     Trachea: No tracheal deviation.  Cardiovascular:     Rate and Rhythm: Normal rate and regular rhythm.     Heart sounds: Murmur heard.  No friction rub. No gallop.   Pulmonary:     Effort: Pulmonary effort is normal. No respiratory distress.  Breath sounds: Normal breath sounds. No wheezing or rales.  Chest:     Chest wall: No tenderness.  Abdominal:     Palpations: Abdomen is soft.  Musculoskeletal:        General: Normal range of motion.     Cervical back: Normal range of motion and neck supple.     Comments: Bilateral upper and lower extremity weakness which has worsened since his most recent visit.   Lymphadenopathy:     Cervical: No cervical adenopathy.  Skin:    General: Skin is warm and dry.  Neurological:     Mental Status: He is alert and oriented to person, place, and time. Mental status is at baseline.     Cranial Nerves: No cranial nerve deficit.     Comments: Speech difficulties noted. Able to speak clearly and answer questions appropriately. Does have some slurred type speech.  There is upper and lower extremity weakness, bilaterally. Using a cane to help with ambulation and balance    Psychiatric:        Attention and Perception: Attention and perception normal.        Mood and Affect: Mood is anxious and depressed.        Speech: Speech is delayed.        Behavior: Behavior normal. Behavior is cooperative.        Thought Content: Thought content normal.        Cognition and Memory: Cognition and memory normal.        Judgment: Judgment normal.     Comments: Improved anxiety since most  recent visit.     Assessment/Plan: 1. Essential hypertension Stable. Continue bp medication as prescribed. He should continue regular visits with cardiology as scheduled.   2. GAD (generalized anxiety disorder) Improved. Continue to use buspirone as needed and as prescribed   3. Cerebrovascular accident (CVA), unspecified mechanism (HCC) He should see neurology when scheduled.   4. Coronary artery disease due to lipid rich plaque Stable. Continue regular visits with cardiology as scheduled.   General Counseling: Dekker verbalizes understanding of the findings of todays visit and agrees with plan of treatment. I have discussed any further diagnostic evaluation that may be needed or ordered today. We also reviewed his medications today. he has been encouraged to call the office with any questions or concerns that should arise related to todays visit.   This patient was seen by Vincent Gros FNP Collaboration with Dr Lyndon Code as a part of collaborative care agreement  Total time spent: 20 Minutes   Time spent includes review of chart, medications, test results, and follow up plan with the patient.      Dr Lyndon Code Internal medicine

## 2020-02-29 NOTE — Telephone Encounter (Signed)
Thank you :)

## 2020-03-10 ENCOUNTER — Other Ambulatory Visit: Payer: Self-pay

## 2020-03-11 ENCOUNTER — Other Ambulatory Visit: Payer: Self-pay

## 2020-03-11 MED ORDER — ENALAPRIL MALEATE 20 MG PO TABS: 20.0000 mg | ORAL_TABLET | Freq: Every day | ORAL | 1 refills | Status: DC

## 2020-04-08 ENCOUNTER — Other Ambulatory Visit: Payer: Self-pay

## 2020-04-08 DIAGNOSIS — I639 Cerebral infarction, unspecified: Secondary | ICD-10-CM

## 2020-04-08 DIAGNOSIS — F411 Generalized anxiety disorder: Secondary | ICD-10-CM

## 2020-04-08 MED ORDER — BUSPIRONE HCL 10 MG PO TABS: 10.0000 mg | ORAL_TABLET | Freq: Three times a day (TID) | ORAL | 3 refills | Status: DC

## 2020-04-08 MED ORDER — CLOPIDOGREL BISULFATE 75 MG PO TABS: 75.0000 mg | ORAL_TABLET | Freq: Every day | ORAL | 3 refills | Status: DC

## 2020-04-08 MED ORDER — ENALAPRIL MALEATE 20 MG PO TABS: 20.0000 mg | ORAL_TABLET | Freq: Every day | ORAL | 1 refills | Status: DC

## 2020-05-15 DIAGNOSIS — I824Y9 Acute embolism and thrombosis of unspecified deep veins of unspecified proximal lower extremity: Secondary | ICD-10-CM | POA: Insufficient documentation

## 2020-05-15 DIAGNOSIS — Z8673 Personal history of transient ischemic attack (TIA), and cerebral infarction without residual deficits: Secondary | ICD-10-CM | POA: Insufficient documentation

## 2020-05-30 ENCOUNTER — Other Ambulatory Visit: Payer: Self-pay

## 2020-05-30 ENCOUNTER — Ambulatory Visit (INDEPENDENT_AMBULATORY_CARE_PROVIDER_SITE_OTHER): Payer: Medicare Other | Admitting: Nurse Practitioner

## 2020-05-30 ENCOUNTER — Encounter: Payer: Self-pay | Admitting: Nurse Practitioner

## 2020-05-30 VITALS — BP 139/94 | HR 60 | Temp 97.5°F | Resp 16 | Ht 69.0 in | Wt 221.6 lb

## 2020-05-30 DIAGNOSIS — I10 Essential (primary) hypertension: Secondary | ICD-10-CM | POA: Diagnosis not present

## 2020-05-30 DIAGNOSIS — Z23 Encounter for immunization: Secondary | ICD-10-CM

## 2020-05-30 DIAGNOSIS — F411 Generalized anxiety disorder: Secondary | ICD-10-CM | POA: Diagnosis not present

## 2020-05-30 DIAGNOSIS — I2510 Atherosclerotic heart disease of native coronary artery without angina pectoris: Secondary | ICD-10-CM | POA: Diagnosis not present

## 2020-05-30 DIAGNOSIS — I2583 Coronary atherosclerosis due to lipid rich plaque: Secondary | ICD-10-CM

## 2020-05-30 MED ORDER — BUSPIRONE HCL 10 MG PO TABS: 10.0000 mg | ORAL_TABLET | Freq: Three times a day (TID) | ORAL | 3 refills | Status: DC

## 2020-05-30 NOTE — Progress Notes (Signed)
[facility] [address] [place], [place] [address]  Internal MEDICINE  Office Visit Note  Patient Name: Howard SiaRichard J Weaver  [id number]  [id number]  Date of Service: 06/21/2020  Chief Complaint  Patient presents with  . Follow-up  . Hypertension    The patient is here for routine follow up visit. The patient was started on buspirone 10mg  two to three times daily as needed for acute anxiety. He states that he is doing better with anxiety control since starting on this medication. States that he has had no negative side effects from taking this. He reports no new concerns or complaints. The patient continues to see cardiology and is now seeing neurology on routine basis. The patient would like to get flu shot while his is here today. He has no new concerns or complaints today.       Current Medication: Outpatient Encounter Medications as of 05/30/2020  Medication Sig  . aspirin EC 81 MG EC tablet Take 1 tablet (81 mg total) by mouth daily.  . busPIRone (BUSPAR) 10 MG tablet Take 1 tablet (10 mg total) by mouth 3 (three) times daily.  . chlorthalidone (HYGROTON) 25 MG tablet Take 25 mg by mouth daily.  . clonazePAM (KLONOPIN) 0.25 MG disintegrating tablet Take 0.25 mg by mouth daily as needed for seizure.  . cloNIDine (CATAPRES) 0.3 MG tablet Take 1 tablet (0.3 mg total) by mouth 2 (two) times daily.  . clopidogrel (PLAVIX) 75 MG tablet Take 1 tablet (75 mg total) by mouth daily.  . enalapril (VASOTEC) 20 MG tablet Take 1 tablet (20 mg total) by mouth daily.  Marland Kitchen. escitalopram (LEXAPRO) 10 MG tablet Take 10 mg by mouth daily.  . isosorbide mononitrate (IMDUR) 30 MG 24 hr tablet Take 30 mg by mouth daily.  . metoprolol succinate (TOPROL-XL) 100 MG 24 hr tablet Take 1 tablet (100 mg total) by mouth daily.  . rosuvastatin (CRESTOR) 20 MG tablet Take 1 tablet (20 mg total) by mouth daily.  . [DISCONTINUED] busPIRone (BUSPAR) 10 MG tablet Take 1 tablet (10 mg total) by mouth 3  (three) times daily.  . [DISCONTINUED] pantoprazole (PROTONIX) 40 MG tablet Take 1 tablet (40 mg total) by mouth daily.   No facility-administered encounter medications on file as of 05/30/2020.    Surgical History: Past Surgical History:  Procedure Laterality Date  . BRAIN SURGERY    . CARDIAC CATHETERIZATION Right 11/25/2015   Procedure: Left Heart Cath and Coronary Angiography;  Surgeon: Laurier NancyShaukat A Khan, MD;  Location: [facility] INVASIVE CV LAB;  Service: Cardiovascular;  Laterality: Right;  . TEE WITHOUT CARDIOVERSION Right 01/17/2020   Procedure: TRANSESOPHAGEAL ECHOCARDIOGRAM (TEE);  Surgeon: Laurier NancyKhan, Shaukat A, MD;  Location: [facility] ORS;  Service: Cardiovascular;  Laterality: Right;    Medical History: Past Medical History:  Diagnosis Date  . CAD (coronary artery disease)   . Chest pain   . DU (duodenal ulcer)   . GAD (generalized anxiety disorder)   . Hypertension   . Myocardial infarction (HCC)   . Radiculopathy of thoracolumbar region   . SVT (supraventricular tachycardia) (HCC)   . Tachycardia     Family History: Family History  Problem Relation Age of Onset  . Hypertension Mother   . Diabetes Mother   . Depression Mother   . Hypertension Father     Social History   Socioeconomic History  . Marital status: Married    Spouse name: Not on file  . Number of children: Not on file  .  Years of education: Not on file  . Highest education level: Not on file  Occupational History  . Not on file  Tobacco Use  . Smoking status: Former Smoker    Quit date: 11/25/1975    Years since quitting: 44.6  . Smokeless tobacco: Never Used  Vaping Use  . Vaping Use: Never used  Substance and Sexual Activity  . Alcohol use: No  . Drug use: No  . Sexual activity: Not on file  Other Topics Concern  . Not on file  Social History Narrative  . Not on file   Social Determinants of Health   Financial Resource Strain:   . Difficulty of Paying Living Expenses: Not on file  Food  Insecurity:   . Worried About Programme researcher, broadcasting/film/video in the Last Year: Not on file  . Ran Out of Food in the Last Year: Not on file  Transportation Needs:   . Lack of Transportation (Medical): Not on file  . Lack of Transportation (Non-Medical): Not on file  Physical Activity:   . Days of Exercise per Week: Not on file  . Minutes of Exercise per Session: Not on file  Stress:   . Feeling of Stress : Not on file  Social Connections:   . Frequency of Communication with Friends and Family: Not on file  . Frequency of Social Gatherings with Friends and Family: Not on file  . Attends Religious Services: Not on file  . Active Member of Clubs or Organizations: Not on file  . Attends Banker Meetings: Not on file  . Marital Status: Not on file  Intimate Partner Violence:   . Fear of Current or Ex-Partner: Not on file  . Emotionally Abused: Not on file  . Physically Abused: Not on file  . Sexually Abused: Not on file      Review of Systems  Constitutional: Negative for activity change, chills, fatigue and unexpected weight change.       Improved activity levels today.  HENT: Negative for congestion, postnasal drip, rhinorrhea, sneezing and sore throat.   Respiratory: Negative for cough, chest tightness, shortness of breath and wheezing.   Cardiovascular: Negative for chest pain and palpitations.       Blood pressure well managed.   Gastrointestinal: Negative for abdominal pain, constipation, diarrhea, nausea and vomiting.  Endocrine: Negative for cold intolerance, heat intolerance, polydipsia and polyuria.  Musculoskeletal: Positive for gait problem and myalgias. Negative for arthralgias, back pain, joint swelling and neck pain.  Skin: Negative for rash.  Allergic/Immunologic: Negative for environmental allergies.  Neurological: Positive for speech difficulty and weakness. Negative for tremors and numbness.       Improved weakness.   Hematological: Negative for adenopathy.  Does not bruise/bleed easily.  Psychiatric/Behavioral: Positive for dysphoric mood. Negative for behavioral problems (Depression), sleep disturbance and suicidal ideas. The patient is nervous/anxious.        Improved anxiety/depression.     Today's Vitals   05/30/20 1112  BP: (!) 139/94  Pulse: 60  Resp: 16  Temp: (!) 97.5 F (36.4 C)  SpO2: 99%  Weight: 221 lb 9.6 oz (100.5 kg)  Height: 5\' 9"  (1.753 m)   Body mass index is 32.72 kg/m.  Physical Exam Vitals and nursing note reviewed.  Constitutional:      General: He is not in acute distress.    Appearance: Normal appearance. He is well-developed. He is not diaphoretic.  HENT:     Head: Normocephalic and atraumatic.  Nose: Nose normal.     Mouth/Throat:     Pharynx: No oropharyngeal exudate.  Eyes:     Conjunctiva/sclera: Conjunctivae normal.     Pupils: Pupils are equal, round, and reactive to light.  Neck:     Thyroid: No thyromegaly.     Vascular: No carotid bruit or JVD.     Trachea: No tracheal deviation.  Cardiovascular:     Rate and Rhythm: Normal rate and regular rhythm.     Heart sounds: Murmur heard.  No friction rub. No gallop.   Pulmonary:     Effort: Pulmonary effort is normal. No respiratory distress.     Breath sounds: Normal breath sounds. No wheezing or rales.  Chest:     Chest wall: No tenderness.  Abdominal:     Palpations: Abdomen is soft.  Musculoskeletal:        General: Normal range of motion.     Cervical back: Normal range of motion and neck supple.     Comments: Bilateral upper and lower extremity weakness which has improved some since he was last seen.    Lymphadenopathy:     Cervical: No cervical adenopathy.  Skin:    General: Skin is warm and dry.  Neurological:     Mental Status: He is alert and oriented to person, place, and time. Mental status is at baseline.     Cranial Nerves: No cranial nerve deficit.     Comments: Speech difficulties noted. Able to speak clearly and  answer questions appropriately. Does have some slurred type speech.  There is upper and lower extremity weakness, bilaterally. Using a cane to help with ambulation and balance  This has improved since his last visit    Psychiatric:        Attention and Perception: Attention and perception normal.        Mood and Affect: Mood is anxious and depressed.        Speech: Speech is delayed.        Behavior: Behavior normal. Behavior is cooperative.        Thought Content: Thought content normal.        Cognition and Memory: Cognition and memory normal.        Judgment: Judgment normal.     Comments: Improved anxiety since most recent visit.     Assessment/Plan: 1. Essential hypertension Generally stable. Continue bp medication as prescribed   2. Coronary artery disease due to lipid rich plaque Continue plavix and rosuvastatin as prescribed. Continue regular visits with cardiology as schedule.d   3. GAD (generalized anxiety disorder) Stable. May continue to take buspirone 10mg  up to three times daily as needed for acute anxiety - busPIRone (BUSPAR) 10 MG tablet; Take 1 tablet (10 mg total) by mouth 3 (three) times daily.  Dispense: 90 tablet; Refill: 3  4. Needs flu shot Flu vaccine administered in the office today.  - Flu Vaccine MDCK QUAD PF  General Counseling: Jazmine verbalizes understanding of the findings of todays visit and agrees with plan of treatment. I have discussed any further diagnostic evaluation that may be needed or ordered today. We also reviewed his medications today. he has been encouraged to call the office with any questions or concerns that should arise related to todays visit.  This patient was seen by FNP Collaboration with Dr Vincent Gros as a part of collaborative care agreement  Orders Placed This Encounter  Procedures  . Flu Vaccine MDCK QUAD PF  Meds ordered this encounter  Medications  . busPIRone (BUSPAR) 10 MG tablet    Sig: Take 1  tablet (10 mg total) by mouth 3 (three) times daily.    Dispense:  90 tablet    Refill:  3    Order Specific Question:   Supervising Provider    Answer:   Lyndon Code [[id number]]    Total time spent: 30 Minutes   Time spent includes review of chart, medications, test results, and follow up plan with the patient.      Dr Lyndon Code Internal medicine

## 2020-06-10 ENCOUNTER — Other Ambulatory Visit: Payer: Self-pay

## 2020-06-10 DIAGNOSIS — K257 Chronic gastric ulcer without hemorrhage or perforation: Secondary | ICD-10-CM

## 2020-06-10 MED ORDER — PANTOPRAZOLE SODIUM 40 MG PO TBEC: 40.0000 mg | DELAYED_RELEASE_TABLET | Freq: Every day | ORAL | 3 refills | Status: DC

## 2020-06-21 DIAGNOSIS — Z23 Encounter for immunization: Secondary | ICD-10-CM | POA: Insufficient documentation

## 2020-08-12 ENCOUNTER — Ambulatory Visit (INDEPENDENT_AMBULATORY_CARE_PROVIDER_SITE_OTHER): Payer: Medicare Other | Admitting: Hospice and Palliative Medicine

## 2020-08-12 ENCOUNTER — Other Ambulatory Visit: Payer: Self-pay

## 2020-08-12 ENCOUNTER — Encounter: Payer: Self-pay | Admitting: Hospice and Palliative Medicine

## 2020-08-12 VITALS — BP 111/79 | HR 61 | Temp 98.0°F | Resp 16 | Ht 68.0 in | Wt 225.0 lb

## 2020-08-12 DIAGNOSIS — R400 Somnolence: Secondary | ICD-10-CM

## 2020-08-12 DIAGNOSIS — Z0000 Encounter for general adult medical examination without abnormal findings: Secondary | ICD-10-CM

## 2020-08-12 DIAGNOSIS — Z125 Encounter for screening for malignant neoplasm of prostate: Secondary | ICD-10-CM | POA: Diagnosis not present

## 2020-08-12 DIAGNOSIS — I10 Essential (primary) hypertension: Secondary | ICD-10-CM | POA: Diagnosis not present

## 2020-08-12 DIAGNOSIS — R300 Dysuria: Secondary | ICD-10-CM

## 2020-08-12 DIAGNOSIS — E782 Mixed hyperlipidemia: Secondary | ICD-10-CM

## 2020-08-12 DIAGNOSIS — N1831 Chronic kidney disease, stage 3a: Secondary | ICD-10-CM

## 2020-08-12 NOTE — Progress Notes (Signed)
[facility] [address] [place], [place] [address]  Internal MEDICINE  Office Visit Note  Patient Name: Howard Weaver  [id number]  [id number]  Date of Service: 08/15/2020  Chief Complaint  Patient presents with  . Annual Exam  . Anxiety  . Hypertension     HPI Pt is here for routine health maintenance examination His sons gf works as a Pharmacologist they both help him with his medications He is unaware of the medications he takes or why he takes them and cannot recall when or how he takes them Followed by neurology as well as nephrology--wanting to wean off clonidine--add amlodipine Refuses to have sleep study due to not wanting to wear CPAP--feels as though this would prevent him from sleeping  Sleeps during the day he feels from his medication and is restless at night No changes in appetite or bowel or urination habits  PHM: Colonoscopy completed 2018, normal--repeat 2028  Current Medication: Outpatient Encounter Medications as of 08/12/2020  Medication Sig  . aspirin EC 81 MG EC tablet Take 1 tablet (81 mg total) by mouth daily.  . busPIRone (BUSPAR) 10 MG tablet Take 1 tablet (10 mg total) by mouth 3 (three) times daily.  . chlorthalidone (HYGROTON) 25 MG tablet Take 25 mg by mouth daily.  . clonazePAM (KLONOPIN) 0.25 MG disintegrating tablet Take 0.25 mg by mouth daily as needed for seizure.  . cloNIDine (CATAPRES) 0.3 MG tablet Take 1 tablet (0.3 mg total) by mouth 2 (two) times daily.  . clopidogrel (PLAVIX) 75 MG tablet Take 1 tablet (75 mg total) by mouth daily.  . enalapril (VASOTEC) 20 MG tablet Take 1 tablet (20 mg total) by mouth daily.  Marland Kitchen escitalopram (LEXAPRO) 10 MG tablet Take 10 mg by mouth daily.  . isosorbide mononitrate (IMDUR) 30 MG 24 hr tablet Take 30 mg by mouth daily.  . metoprolol succinate (TOPROL-XL) 100 MG 24 hr tablet Take 1 tablet (100 mg total) by mouth daily.  . pantoprazole (PROTONIX) 40 MG tablet Take 1 tablet (40  mg total) by mouth daily.  . rosuvastatin (CRESTOR) 20 MG tablet Take 1 tablet (20 mg total) by mouth daily.   No facility-administered encounter medications on file as of 08/12/2020.    Surgical History: Past Surgical History:  Procedure Laterality Date  . BRAIN SURGERY    . CARDIAC CATHETERIZATION Right 11/25/2015   Procedure: Left Heart Cath and Coronary Angiography;  Surgeon: Laurier Nancy, MD;  Location: [facility] INVASIVE CV LAB;  Service: Cardiovascular;  Laterality: Right;  . TEE WITHOUT CARDIOVERSION Right 01/17/2020   Procedure: TRANSESOPHAGEAL ECHOCARDIOGRAM (TEE);  Surgeon: Laurier Nancy, MD;  Location: [facility] ORS;  Service: Cardiovascular;  Laterality: Right;    Medical History: Past Medical History:  Diagnosis Date  . CAD (coronary artery disease)   . Chest pain   . DU (duodenal ulcer)   . GAD (generalized anxiety disorder)   . Hypertension   . Myocardial infarction (HCC)   . Radiculopathy of thoracolumbar region   . SVT (supraventricular tachycardia) (HCC)   . Tachycardia     Family History: Family History  Problem Relation Age of Onset  . Hypertension Mother   . Diabetes Mother   . Depression Mother   . Hypertension Father    Review of Systems  Constitutional: Negative for chills, fatigue and unexpected weight change.  HENT: Negative for congestion, postnasal drip, rhinorrhea, sneezing and sore throat.   Eyes: Negative for redness.  Respiratory: Negative for cough, chest  tightness and shortness of breath.   Cardiovascular: Negative for chest pain and palpitations.  Gastrointestinal: Negative for abdominal pain, constipation, diarrhea, nausea and vomiting.  Genitourinary: Negative for dysuria and frequency.  Musculoskeletal: Negative for arthralgias, back pain, joint swelling and neck pain.  Skin: Negative for rash.  Neurological: Negative for tremors and numbness.  Hematological: Negative for adenopathy. Does not bruise/bleed easily.   Psychiatric/Behavioral: Negative for behavioral problems (Depression), sleep disturbance and suicidal ideas. The patient is not nervous/anxious.      Vital Signs: BP 111/79   Pulse 61   Temp 98 F (36.7 C)   Resp 16   Ht 5\' 8"  (1.727 m)   Wt 225 lb (102.1 kg)   SpO2 99%   BMI 34.21 kg/m    Physical Exam Vitals reviewed.  Constitutional:      Appearance: Normal appearance. He is obese.  HENT:     Right Ear: Tympanic membrane normal.     Left Ear: Tympanic membrane normal.     Nose: Nose normal.     Mouth/Throat:     Mouth: Mucous membranes are moist.     Pharynx: Oropharynx is clear.  Eyes:     Pupils: Pupils are equal, round, and reactive to light.  Cardiovascular:     Rate and Rhythm: Normal rate and regular rhythm.     Pulses: Normal pulses.     Heart sounds: Normal heart sounds.  Pulmonary:     Effort: Pulmonary effort is normal.     Breath sounds: Normal breath sounds.  Abdominal:     General: Abdomen is flat.     Palpations: Abdomen is soft.  Musculoskeletal:        General: Normal range of motion.     Cervical back: Normal range of motion.  Skin:    General: Skin is warm.  Neurological:     General: No focal deficit present.     Mental Status: He is alert and oriented to person, place, and time. Mental status is at baseline.  Psychiatric:        Mood and Affect: Mood normal.        Behavior: Behavior normal.        Thought Content: Thought content normal.        Judgment: Judgment normal.      LABS: Recent Results (from the past 2160 hour(s))  UA/M w/rflx Culture, Routine     Status: None   Collection Time: 08/12/20  4:11 PM   Specimen: Urine   Urine  Result Value Ref Range   Specific Gravity, UA 1.016 1.005 - 1.030   pH, UA 7.0 5.0 - 7.5   Color, UA Yellow Yellow   Appearance Ur Clear Clear   Leukocytes,UA Negative Negative   Protein,UA Negative Negative/Trace   Glucose, UA Negative Negative   Ketones, UA Negative Negative   RBC, UA  Negative Negative   Bilirubin, UA Negative Negative   Urobilinogen, Ur 1.0 0.2 - 1.0 mg/dL   Nitrite, UA Negative Negative   Microscopic Examination Comment     Comment: Microscopic follows if indicated.   Microscopic Examination See below:     Comment: Microscopic was indicated and was performed.   Urinalysis Reflex Comment     Comment: This specimen will not reflex to a Urine Culture.  Microscopic Examination     Status: None   Collection Time: 08/12/20  4:11 PM   Urine  Result Value Ref Range   WBC, UA None seen 0 - 5 /  hpf   RBC None seen 0 - 2 /hpf   Epithelial Cells (non renal) None seen 0 - 10 /hpf   Casts None seen None seen /lpf   Bacteria, UA None seen None seen/Few    Assessment/Plan: 1. Encounter for routine adult medical examination Well appearing 64 year old male Will review thyroid levels as other labs have been previous checked Up to date on PHM Asked to bring an updated list on his medications to next visit - TSH + free T4  2. Stage 3a chronic kidney disease (HCC) Followed and managed by nephrology-GFR stable  3. Essential hypertension BP and HR stable at this time--review notes from nephrology as well as neurology--will be weaning him off of clonidine, will continue to follow  4. Screening for prostate cancer - PSA  5. Daytime sleepiness - TSH + free T4  6. Dysuria - UA/M w/rflx Culture, Routine  7. Mixed hyperlipidemia Continue with Plavix and statin therapy  General Counseling: Howard Weaver verbalizes understanding of the findings of todays visit and agrees with plan of treatment. I have discussed any further diagnostic evaluation that may be needed or ordered today. We also reviewed his medications today. he has been encouraged to call the office with any questions or concerns that should arise related to todays visit.    Counseling:    Orders Placed This Encounter  Procedures  . Microscopic Examination  . TSH + free T4  . PSA  . UA/M w/rflx  Culture, Routine      Total time spent: 30 Minutes  Time spent includes review of chart, medications, test results, and follow up plan with the patient.   This patient was seen by Brent General AGNP-C Collaboration with Dr Lyndon Code as a part of collaborative care agreement   Lubertha Basque. [facility]

## 2020-08-13 LAB — MICROSCOPIC EXAMINATION
Bacteria, UA: NONE SEEN
Casts: NONE SEEN /lpf
Epithelial Cells (non renal): NONE SEEN /hpf (ref 0–10)
RBC, Urine: NONE SEEN /hpf (ref 0–2)
WBC, UA: NONE SEEN /hpf (ref 0–5)

## 2020-08-13 LAB — UA/M W/RFLX CULTURE, ROUTINE
Bilirubin, UA: NEGATIVE
Glucose, UA: NEGATIVE
Ketones, UA: NEGATIVE
Leukocytes,UA: NEGATIVE
Nitrite, UA: NEGATIVE
Protein,UA: NEGATIVE
RBC, UA: NEGATIVE
Specific Gravity, UA: 1.016 (ref 1.005–1.030)
Urobilinogen, Ur: 1 mg/dL (ref 0.2–1.0)
pH, UA: 7 (ref 5.0–7.5)

## 2020-08-15 ENCOUNTER — Encounter: Payer: Self-pay | Admitting: Hospice and Palliative Medicine

## 2020-08-23 ENCOUNTER — Other Ambulatory Visit: Payer: Self-pay

## 2020-08-23 ENCOUNTER — Encounter: Payer: Self-pay | Admitting: Emergency Medicine

## 2020-08-23 ENCOUNTER — Emergency Department
Admission: EM | Admit: 2020-08-23 | Discharge: 2020-08-24 | Disposition: A | Discharge status: Home / Self Care | Payer: Medicare Other | Attending: Emergency Medicine | Admitting: Emergency Medicine

## 2020-08-23 DIAGNOSIS — I2583 Coronary atherosclerosis due to lipid rich plaque: Secondary | ICD-10-CM | POA: Insufficient documentation

## 2020-08-23 DIAGNOSIS — Z7982 Long term (current) use of aspirin: Secondary | ICD-10-CM | POA: Diagnosis not present

## 2020-08-23 DIAGNOSIS — Z79899 Other long term (current) drug therapy: Secondary | ICD-10-CM | POA: Diagnosis not present

## 2020-08-23 DIAGNOSIS — N1831 Chronic kidney disease, stage 3a: Secondary | ICD-10-CM | POA: Insufficient documentation

## 2020-08-23 DIAGNOSIS — I129 Hypertensive chronic kidney disease with stage 1 through stage 4 chronic kidney disease, or unspecified chronic kidney disease: Secondary | ICD-10-CM | POA: Diagnosis not present

## 2020-08-23 DIAGNOSIS — R1110 Vomiting, unspecified: Secondary | ICD-10-CM | POA: Diagnosis present

## 2020-08-23 DIAGNOSIS — I2510 Atherosclerotic heart disease of native coronary artery without angina pectoris: Secondary | ICD-10-CM | POA: Diagnosis not present

## 2020-08-23 DIAGNOSIS — Z87891 Personal history of nicotine dependence: Secondary | ICD-10-CM | POA: Diagnosis not present

## 2020-08-23 DIAGNOSIS — U071 COVID-19: Secondary | ICD-10-CM | POA: Insufficient documentation

## 2020-08-23 LAB — RESP PANEL BY RT-PCR (FLU A&B, COVID) ARPGX2
Influenza A by PCR: NEGATIVE
Influenza B by PCR: NEGATIVE
SARS Coronavirus 2 by RT PCR: POSITIVE — AB

## 2020-08-23 LAB — COMPREHENSIVE METABOLIC PANEL
ALT: 32 U/L (ref 0–44)
AST: 41 U/L (ref 15–41)
Albumin: 4.2 g/dL (ref 3.5–5.0)
Alkaline Phosphatase: 47 U/L (ref 38–126)
Anion gap: 14 (ref 5–15)
BUN: 20 mg/dL (ref 8–23)
CO2: 22 mmol/L (ref 22–32)
Calcium: 9.8 mg/dL (ref 8.9–10.3)
Chloride: 98 mmol/L (ref 98–111)
Creatinine, Ser: 1.46 mg/dL — ABNORMAL HIGH (ref 0.61–1.24)
GFR, Estimated: 53 mL/min — ABNORMAL LOW (ref >60)
Glucose, Bld: 142 mg/dL — ABNORMAL HIGH (ref 70–99)
Potassium: 3.8 mmol/L (ref 3.5–5.1)
Sodium: 134 mmol/L — ABNORMAL LOW (ref 135–145)
Total Bilirubin: 0.6 mg/dL (ref 0.3–1.2)
Total Protein: 8.5 g/dL — ABNORMAL HIGH (ref 6.5–8.1)

## 2020-08-23 LAB — CBC WITH DIFFERENTIAL/PLATELET
Abs Immature Granulocytes: 0.01 10*3/uL (ref 0.00–0.07)
Basophils Absolute: 0 10*3/uL (ref 0.0–0.1)
Basophils Relative: 0 %
Eosinophils Absolute: 0 10*3/uL (ref 0.0–0.5)
Eosinophils Relative: 0 %
HCT: 49.2 % (ref 39.0–52.0)
Hemoglobin: 16.6 g/dL (ref 13.0–17.0)
Immature Granulocytes: 0 %
Lymphocytes Relative: 9 %
Lymphs Abs: 0.5 10*3/uL — ABNORMAL LOW (ref 0.7–4.0)
MCH: 29.1 pg (ref 26.0–34.0)
MCHC: 33.7 g/dL (ref 30.0–36.0)
MCV: 86.2 fL (ref 80.0–100.0)
Monocytes Absolute: 0.7 10*3/uL (ref 0.1–1.0)
Monocytes Relative: 13 %
Neutro Abs: 3.9 10*3/uL (ref 1.7–7.7)
Neutrophils Relative %: 78 %
Platelets: 177 10*3/uL (ref 150–400)
RBC: 5.71 MIL/uL (ref 4.22–5.81)
RDW: 13.4 % (ref 11.5–15.5)
WBC: 5.1 10*3/uL (ref 4.0–10.5)
nRBC: 0 % (ref 0.0–0.2)

## 2020-08-23 LAB — LACTIC ACID, PLASMA: Lactic Acid, Venous: 1.8 mmol/L (ref 0.5–1.9)

## 2020-08-23 NOTE — ED Notes (Signed)
Date and time results received: 08/23/20 2047 (use smartphrase ".now" to insert current time)  Test: Covid-19 Critical Value: Positive  Name of Provider Notified: Dr. Larinda Buttery  Orders Received? Or Actions Taken?: provider notified

## 2020-08-23 NOTE — ED Triage Notes (Signed)
Pt arrived via [facility] with reports of vomiting x 1, body aches, sweats, took home COVID test today that was positive.  Sxs started yesterday with body aches and chills/sweats.

## 2020-08-24 MED ORDER — ONDANSETRON 4 MG PO TBDP: ORAL_TABLET | ORAL | 0 refills | Status: DC

## 2020-08-24 MED ORDER — HYDROCOD POLST-CPM POLST ER 10-8 MG/5ML PO SUER: 5.0000 mL | Freq: Two times a day (BID) | ORAL | 0 refills | Status: DC | PRN

## 2020-08-24 NOTE — Discharge Instructions (Addendum)
As we discussed, although you have tested positive for COVID-19 (coronavirus), you do not need to be hospitalized at this time.  Read through all the included information including the recommendations from the CDC.  We recommend that you self-quarantine at home with your immediate family only (people with whom you have already been in contact) for 10-14 days after your fever has gone away (without taking medication to make your temperature come down, such as Tylenol (acetaminophen)), after your respiratory symptoms have improved, and after at least 14 days have passed since your symptoms first appeared.  You should have as minimal contact as possible with anyone else including close family as per the CDC paperwork guidelines listed below. Follow-up with your doctor by phone or online as needed and return immediately to the emergency department or call 911 only if you develop new or worsening symptoms that concern you.  If you were prescribed any medications, please use them as instructed.  If you were given information for the COVID-19 antibody infusion treatment clinic, please call them and leave your contact information.  This can be a very effective and important treatment method, and you should discuss with them if you qualify for treatment.  They may even have transportation services available to them from [place] if you require them.  You can find up-to-date information about COVID-19 in [place] by calling the [place] Coronavirus Helpline: 1-866-462-3821. You may also call 2-1-1, or 888-892-1162, or additional resources.  You can also find information online at https://www.ncdhhs.gov/divisions/public-health/coronavirus-disease-2019-covid-19-response-[place], or on the Center for Disease Control (CDC) website at https://www.cdc.gov/coronavirus/2019-ncov/index.html.  

## 2020-08-24 NOTE — ED Provider Notes (Signed)
[facility] Emergency Department Provider Note  ____________________________________________   Event Date/Time   First MD Initiated Contact with Patient 08/23/20 2258     (approximate)  I have reviewed the triage vital signs and the nursing notes.   HISTORY  Chief Complaint Emesis and Covid Positive    HPI Howard Weaver is a 64 y.o. male with medical history as listed below who presents for evaluation of generalized body aches and one episode of vomiting.  He has had a sore throat, some chills, loss of smell and taste, decreased appetite, a little bit of nausea, occasional mild cough.  He does not specifically have chest pain, he just says his whole body hurts everywhere.   He hurts a little bit worse in his abdomen when he coughs but other than that he has no abdominal pain.  He is ambulatory.  He has no shortness of breath.  He said the symptoms are severe and nothing in particular makes them better or worse.  He has had symptoms for about 2 days.  He said he is fully vaccinated with 2 shots but he has not yet had his booster shot.        Past Medical History:  Diagnosis Date  . CAD (coronary artery disease)   . Chest pain   . DU (duodenal ulcer)   . GAD (generalized anxiety disorder)   . Hypertension   . Myocardial infarction (HCC)   . Radiculopathy of thoracolumbar region   . SVT (supraventricular tachycardia) (HCC)   . Tachycardia     Patient Active Problem List   Diagnosis Date Noted  . Needs flu shot 06/21/2020  . Hospital discharge follow-up 01/20/2020  . Dizziness 01/15/2020  . Elevated troponin 01/15/2020  . Cerebrovascular accident (CVA) (HCC) 01/15/2020  . Encounter for general adult medical examination with abnormal findings 04/09/2018  . Essential hypertension 11/27/2017  . Mixed hyperlipidemia 11/27/2017  . Peptic ulcer of stomach, chronic 11/27/2017  . Low back pain without sciatica 11/27/2017  . Accelerated hypertension  01/21/2017  . Coronary artery disease due to lipid rich plaque 01/21/2017  . GAD (generalized anxiety disorder) 01/21/2017  . CKD (chronic kidney disease), stage III (HCC) 01/21/2017  . Chest pain 11/25/2015    Past Surgical History:  Procedure Laterality Date  . BRAIN SURGERY    . CARDIAC CATHETERIZATION Right 11/25/2015   Procedure: Left Heart Cath and Coronary Angiography;  Surgeon: Laurier Nancy, MD;  Location: [facility] INVASIVE CV LAB;  Service: Cardiovascular;  Laterality: Right;  . TEE WITHOUT CARDIOVERSION Right 01/17/2020   Procedure: TRANSESOPHAGEAL ECHOCARDIOGRAM (TEE);  Surgeon: Laurier Nancy, MD;  Location: [facility] ORS;  Service: Cardiovascular;  Laterality: Right;    Prior to Admission medications   Medication Sig Start Date End Date Taking? Authorizing Provider  aspirin EC 81 MG EC tablet Take 1 tablet (81 mg total) by mouth daily. 01/18/20   Arnetha Courser, MD  busPIRone (BUSPAR) 10 MG tablet Take 1 tablet (10 mg total) by mouth 3 (three) times daily. 05/30/20   Carlean Jews, NP  chlorpheniramine-HYDROcodone (TUSSIONEX PENNKINETIC ER) 10-8 MG/5ML SUER Take 5 mLs by mouth every 12 (twelve) hours as needed for cough. 08/24/20   Loleta Rose, MD  chlorthalidone (HYGROTON) 25 MG tablet Take 25 mg by mouth daily.    [provider]  clonazePAM (KLONOPIN) 0.25 MG disintegrating tablet Take 0.25 mg by mouth daily as needed for seizure.    Morene Crocker, MD  cloNIDine (CATAPRES) 0.3  MG tablet Take 1 tablet (0.3 mg total) by mouth 2 (two) times daily. 01/17/20   Arnetha Courser, MD  clopidogrel (PLAVIX) 75 MG tablet Take 1 tablet (75 mg total) by mouth daily. 04/08/20   Carlean Jews, NP  enalapril (VASOTEC) 20 MG tablet Take 1 tablet (20 mg total) by mouth daily. 04/08/20   Carlean Jews, NP  escitalopram (LEXAPRO) 10 MG tablet Take 10 mg by mouth daily.    Morene Crocker, MD  isosorbide mononitrate (IMDUR) 30 MG 24 hr tablet Take 30 mg by mouth daily.    [provider]  metoprolol succinate (TOPROL-XL) 100 MG 24 hr tablet Take 1 tablet (100 mg total) by mouth daily. 08/30/19   Johnna Acosta, NP  ondansetron (ZOFRAN ODT) 4 MG disintegrating tablet Allow 1-2 tablets to dissolve in your mouth every 8 hours as needed for nausea/vomiting 08/24/20   Loleta Rose, MD  pantoprazole (PROTONIX) 40 MG tablet Take 1 tablet (40 mg total) by mouth daily. 06/10/20   Carlean Jews, NP  rosuvastatin (CRESTOR) 20 MG tablet Take 1 tablet (20 mg total) by mouth daily. 01/18/20   Arnetha Courser, MD    Allergies Penicillins and Ibuprofen  Family History  Problem Relation Age of Onset  . Hypertension Mother   . Diabetes Mother   . Depression Mother   . Hypertension Father     Social History Social History   Tobacco Use  . Smoking status: Former Smoker    Quit date: 11/25/1975    Years since quitting: 44.7  . Smokeless tobacco: Never Used  Vaping Use  . Vaping Use: Never used  Substance Use Topics  . Alcohol use: No  . Drug use: No    Review of Systems Constitutional: +fever/chills Eyes: No visual changes. ENT: +sore throat.  + Loss of smell and taste. Cardiovascular: Denies chest pain. Respiratory: Denies shortness of breath.  No cough. Gastrointestinal: Loss of appetite.  No abdominal pain except when he coughs.  Mild nausea, one episode of vomiting.  No diarrhea.  No constipation. Genitourinary: Negative for dysuria. Musculoskeletal: Generalized body aches.  Negative for neck pain.  Negative for back pain. Integumentary: Negative for rash. Neurological: Negative for headaches, focal weakness or numbness.   ____________________________________________   PHYSICAL EXAM:  VITAL SIGNS: ED Triage Vitals [08/23/20 1939]  Enc Vitals Group     BP (!) 139/111     Pulse Rate (!) 112     Resp 20     Temp 98.6 F (37 C)     Temp Source Oral     SpO2 96 %     Weight 102.1 kg (225 lb)     Height 1.727 m (5\' 8" )     Head Circumference       Peak Flow      Pain Score 5     Pain Loc      Pain Edu?      Excl. in GC?     Constitutional: Alert and oriented.  Generally well-appearing in spite of his symptoms. Eyes: Conjunctivae are normal.  Head: Atraumatic. Nose: No congestion/rhinnorhea. Mouth/Throat: Patient is wearing a mask. Neck: No stridor.  No meningeal signs.   Cardiovascular: Normal rate, regular rhythm. Good peripheral circulation. Respiratory: Normal respiratory effort.  No retractions. Gastrointestinal: Soft and nontender. No distention.  Musculoskeletal: No lower extremity tenderness nor edema. No gross deformities of extremities. Neurologic:  Normal speech and language. No gross focal neurologic deficits are appreciated.  Skin:  Skin is warm, dry and intact. Psychiatric: Mood and affect are normal. Speech and behavior are normal.  ____________________________________________   LABS (all labs ordered are listed, but only abnormal results are displayed)  Labs Reviewed  RESP PANEL BY RT-PCR (FLU A&B, COVID) ARPGX2 - Abnormal; Notable for the following components:      Result Value   SARS Coronavirus 2 by RT PCR POSITIVE (*)    All other components within normal limits  COMPREHENSIVE METABOLIC PANEL - Abnormal; Notable for the following components:   Sodium 134 (*)    Glucose, Bld 142 (*)    Creatinine, Ser 1.46 (*)    Total Protein 8.5 (*)    GFR, Estimated 53 (*)    All other components within normal limits  CBC WITH DIFFERENTIAL/PLATELET - Abnormal; Notable for the following components:   Lymphs Abs 0.5 (*)    All other components within normal limits  LACTIC ACID, PLASMA   ____________________________________________  EKG  No indication for emergent EKG ____________________________________________  RADIOLOGY I, Loleta Rose, personally viewed and evaluated these images (plain radiographs) as part of my medical decision making, as well as reviewing the written report by the  radiologist.  ED MD interpretation: No indication for emergent imaging  Official radiology report(s): No results found.  ____________________________________________   PROCEDURES   Procedure(s) performed (including Critical Care):  Procedures   ____________________________________________   INITIAL IMPRESSION / MDM / ASSESSMENT AND PLAN / ED COURSE  As part of my medical decision making, I reviewed the following data within the electronic MEDICAL RECORD NUMBER Nursing notes reviewed and incorporated, Labs reviewed , Old chart reviewed and Notes from prior ED visits and I reviewed the [place] controlled substance database.   Differential diagnosis includes, but is not limited to, COVID-19, PE, influenza, electrolyte or metabolic abnormality, acute kidney injury.  Patient's vital signs are relatively stable other than some mild tachycardia which is understanding given his positive COVID-19 diagnosis.  He is having no additional vomiting or nausea at this time.  Metabolic panel is generally reassuring.  His creatinine is slightly up from baseline but it is always elevated and this is not a substantial change.  He is able to eat and drink adequately, says he just does not want to.  CBC is normal, lactic acid is normal.  I had my usual and customary COVID-19 discussion with the patient including outpatient treatment, and I sent his medical record to the monoclonal antibody infusion clinic to follow-up with him to see if he would be eligible.  He understands and agrees with the plan and knows to return if he develops severe respiratory distress or other symptoms that concern him.           ____________________________________________  FINAL CLINICAL IMPRESSION(S) / ED DIAGNOSES  Final diagnoses:  COVID-19     MEDICATIONS GIVEN DURING THIS VISIT:  Medications - No data to display   ED Discharge Orders         Ordered    ondansetron (ZOFRAN ODT) 4 MG disintegrating  tablet        08/24/20 0017    chlorpheniramine-HYDROcodone (TUSSIONEX PENNKINETIC ER) 10-8 MG/5ML SUER  Every 12 hours PRN        08/24/20 0017          *Please note:  Ellamae Sia was evaluated in Emergency Department on 08/24/2020 for the symptoms described in the history of present illness. He was evaluated in the context of the global COVID-19 pandemic, which  necessitated consideration that the patient might be at risk for infection with the SARS-CoV-2 virus that causes COVID-19. Institutional protocols and algorithms that pertain to the evaluation of patients at risk for COVID-19 are in a state of rapid change based on information released by regulatory bodies including the CDC and federal and state organizations. These policies and algorithms were followed during the patient's care in the ED.  Some ED evaluations and interventions may be delayed as a result of limited staffing during and after the pandemic.*  Note:  This document was prepared using Dragon voice recognition software and may include unintentional dictation errors.   Loleta RoseForbach, Farida Mcreynolds, MD 08/24/20 [phone]

## 2020-08-25 ENCOUNTER — Other Ambulatory Visit: Payer: Self-pay | Admitting: Hospice and Palliative Medicine

## 2020-08-25 MED ORDER — HYDROCODONE-HOMATROPINE 5-1.5 MG/5ML PO SYRP: 5.0000 mL | ORAL_SOLUTION | Freq: Four times a day (QID) | ORAL | 0 refills | Status: DC | PRN

## 2020-08-25 MED ORDER — HYDROCODONE-HOMATROPINE 5-1.5 MG/5ML PO SYRP
5.0000 mL | ORAL_SOLUTION | Freq: Four times a day (QID) | ORAL | 0 refills | Status: DC | PRN
Start: 2020-08-25 — End: 2021-01-14

## 2020-08-25 MED ORDER — GUAIFENESIN-DM 100-10 MG/5ML PO SYRP: 5.0000 mL | ORAL_SOLUTION | ORAL | 0 refills | Status: DC | PRN

## 2020-08-25 NOTE — Progress Notes (Signed)
Called and spoke with Howard Weaver.-- Howard Weaver son Howard Weaver was seen in emergency department 12/26 and found to be COVID positive Biggest complaint is overall body aches and cough Has remained febrile, likely causing body aches--has been taking acetaminophen which is somewhat helpful Hospital discharged him with Tussionex as well as Zofran He is unable to afford Tussionex and called requesting a different cough syrup that is less expensive, requesting to have codeine or hydrocodone in cough syrup due to body aches  Sent in script for Robitussin and advised to continue to treat body aches/fever with acetaminophen  Son to call back tomorrow with an update on his dad--will adjust therapy at that time as needed

## 2020-08-27 ENCOUNTER — Other Ambulatory Visit: Payer: Self-pay

## 2020-09-02 ENCOUNTER — Telehealth: Payer: Self-pay

## 2020-09-02 MED ORDER — BENZONATATE 100 MG PO CAPS: 100.0000 mg | ORAL_CAPSULE | Freq: Two times a day (BID) | ORAL | 0 refills | Status: DC

## 2020-09-02 NOTE — Telephone Encounter (Signed)
Pt's daughter n law called requesting another refill on the Hycodan cough syrup.  Advised that he was still coughing bad and says that it hurts him and he can't breathe.  He is using mucinex, no fever.  Per DFK I will send in Benzonate caps 100 mg BID.  Informed daughter n law that he can also try Delsym cough syrup twice daily.  Per DFK I was to call pt and see if he would do a video visit.  I called pt several times and there was no answer and I also LMOM that we asked him to call us.

## 2020-09-05 ENCOUNTER — Other Ambulatory Visit: Payer: Self-pay | Admitting: Adult Health

## 2020-09-05 ENCOUNTER — Other Ambulatory Visit: Payer: Self-pay

## 2020-09-05 DIAGNOSIS — I10 Essential (primary) hypertension: Secondary | ICD-10-CM

## 2020-09-05 DIAGNOSIS — I639 Cerebral infarction, unspecified: Secondary | ICD-10-CM

## 2020-09-05 MED ORDER — CLOPIDOGREL BISULFATE 75 MG PO TABS: 75.0000 mg | ORAL_TABLET | Freq: Every day | ORAL | 3 refills | Status: DC

## 2020-09-06 ENCOUNTER — Other Ambulatory Visit: Payer: Self-pay | Admitting: Adult Health

## 2020-09-06 DIAGNOSIS — I10 Essential (primary) hypertension: Secondary | ICD-10-CM

## 2020-09-09 ENCOUNTER — Other Ambulatory Visit: Payer: Self-pay | Admitting: Adult Health

## 2020-09-09 DIAGNOSIS — I10 Essential (primary) hypertension: Secondary | ICD-10-CM

## 2020-09-30 ENCOUNTER — Other Ambulatory Visit: Payer: Self-pay | Admitting: Adult Health

## 2020-09-30 ENCOUNTER — Ambulatory Visit: Payer: Medicare Other | Admitting: Hospice and Palliative Medicine

## 2020-09-30 DIAGNOSIS — I10 Essential (primary) hypertension: Secondary | ICD-10-CM

## 2020-10-01 ENCOUNTER — Other Ambulatory Visit: Payer: Self-pay

## 2020-10-01 DIAGNOSIS — I10 Essential (primary) hypertension: Secondary | ICD-10-CM

## 2020-10-01 DIAGNOSIS — K257 Chronic gastric ulcer without hemorrhage or perforation: Secondary | ICD-10-CM

## 2020-10-01 MED ORDER — PANTOPRAZOLE SODIUM 40 MG PO TBEC: 40.0000 mg | DELAYED_RELEASE_TABLET | Freq: Every day | ORAL | 0 refills | Status: DC

## 2020-10-01 MED ORDER — METOPROLOL SUCCINATE ER 100 MG PO TB24: 100.0000 mg | ORAL_TABLET | Freq: Every day | ORAL | 0 refills | Status: DC

## 2020-10-02 ENCOUNTER — Other Ambulatory Visit: Payer: Self-pay

## 2020-10-02 DIAGNOSIS — K257 Chronic gastric ulcer without hemorrhage or perforation: Secondary | ICD-10-CM

## 2020-10-02 MED ORDER — PANTOPRAZOLE SODIUM 40 MG PO TBEC: 40.0000 mg | DELAYED_RELEASE_TABLET | Freq: Every day | ORAL | 0 refills | Status: DC

## 2020-10-27 ENCOUNTER — Other Ambulatory Visit: Payer: Self-pay | Admitting: Hospice and Palliative Medicine

## 2020-10-27 DIAGNOSIS — I10 Essential (primary) hypertension: Secondary | ICD-10-CM

## 2020-11-25 ENCOUNTER — Other Ambulatory Visit: Payer: Self-pay | Admitting: Hospice and Palliative Medicine

## 2020-11-25 DIAGNOSIS — K257 Chronic gastric ulcer without hemorrhage or perforation: Secondary | ICD-10-CM

## 2020-11-25 DIAGNOSIS — I10 Essential (primary) hypertension: Secondary | ICD-10-CM

## 2020-12-19 DIAGNOSIS — R262 Difficulty in walking, not elsewhere classified: Secondary | ICD-10-CM | POA: Diagnosis not present

## 2020-12-19 DIAGNOSIS — R42 Dizziness and giddiness: Secondary | ICD-10-CM | POA: Diagnosis not present

## 2020-12-19 DIAGNOSIS — Z8659 Personal history of other mental and behavioral disorders: Secondary | ICD-10-CM | POA: Diagnosis not present

## 2020-12-19 DIAGNOSIS — F411 Generalized anxiety disorder: Secondary | ICD-10-CM | POA: Diagnosis not present

## 2020-12-19 DIAGNOSIS — I10 Essential (primary) hypertension: Secondary | ICD-10-CM | POA: Diagnosis not present

## 2020-12-19 DIAGNOSIS — Z8673 Personal history of transient ischemic attack (TIA), and cerebral infarction without residual deficits: Secondary | ICD-10-CM | POA: Diagnosis not present

## 2020-12-22 ENCOUNTER — Other Ambulatory Visit: Payer: Self-pay | Admitting: Nurse Practitioner

## 2020-12-22 ENCOUNTER — Other Ambulatory Visit: Payer: Self-pay | Admitting: Hospice and Palliative Medicine

## 2020-12-22 DIAGNOSIS — I10 Essential (primary) hypertension: Secondary | ICD-10-CM

## 2020-12-22 DIAGNOSIS — K257 Chronic gastric ulcer without hemorrhage or perforation: Secondary | ICD-10-CM

## 2020-12-22 DIAGNOSIS — F411 Generalized anxiety disorder: Secondary | ICD-10-CM

## 2020-12-22 DIAGNOSIS — I639 Cerebral infarction, unspecified: Secondary | ICD-10-CM

## 2020-12-22 NOTE — Telephone Encounter (Signed)
Refill this time only but will need follow up for future app

## 2021-01-14 ENCOUNTER — Ambulatory Visit (INDEPENDENT_AMBULATORY_CARE_PROVIDER_SITE_OTHER): Payer: Medicare HMO | Admitting: Nurse Practitioner

## 2021-01-14 ENCOUNTER — Other Ambulatory Visit: Payer: Self-pay

## 2021-01-14 ENCOUNTER — Encounter: Payer: Self-pay | Admitting: Nurse Practitioner

## 2021-01-14 ENCOUNTER — Telehealth: Payer: Self-pay

## 2021-01-14 DIAGNOSIS — K257 Chronic gastric ulcer without hemorrhage or perforation: Secondary | ICD-10-CM

## 2021-01-14 DIAGNOSIS — I639 Cerebral infarction, unspecified: Secondary | ICD-10-CM | POA: Diagnosis not present

## 2021-01-14 DIAGNOSIS — F411 Generalized anxiety disorder: Secondary | ICD-10-CM

## 2021-01-14 DIAGNOSIS — I10 Essential (primary) hypertension: Secondary | ICD-10-CM

## 2021-01-14 MED ORDER — ISOSORBIDE MONONITRATE ER 30 MG PO TB24: 30.0000 mg | ORAL_TABLET | Freq: Every day | ORAL | 1 refills | Status: DC

## 2021-01-14 MED ORDER — ENALAPRIL MALEATE 20 MG PO TABS: 40.0000 mg | ORAL_TABLET | Freq: Every day | ORAL | 0 refills | Status: DC

## 2021-01-14 MED ORDER — ESCITALOPRAM OXALATE 20 MG PO TABS: 20.0000 mg | ORAL_TABLET | Freq: Every day | ORAL | 1 refills | Status: DC

## 2021-01-14 MED ORDER — METOPROLOL SUCCINATE ER 100 MG PO TB24: 100.0000 mg | ORAL_TABLET | Freq: Every day | ORAL | 0 refills | Status: DC

## 2021-01-14 MED ORDER — ASPIRIN 81 MG PO TBEC: 81.0000 mg | DELAYED_RELEASE_TABLET | Freq: Every day | ORAL | 0 refills | Status: DC

## 2021-01-14 MED ORDER — CLONAZEPAM 0.5 MG PO TABS: ORAL_TABLET | ORAL | 0 refills | Status: DC

## 2021-01-14 MED ORDER — CHLORTHALIDONE 25 MG PO TABS: 25.0000 mg | ORAL_TABLET | Freq: Every day | ORAL | 0 refills | Status: DC

## 2021-01-14 MED ORDER — PANTOPRAZOLE SODIUM 40 MG PO TBEC: 1.0000 | DELAYED_RELEASE_TABLET | Freq: Every day | ORAL | 0 refills | Status: DC

## 2021-01-14 MED ORDER — COVID-19 MRNA VAC-TRIS(PFIZER) 30 MCG/0.3ML IM SUSP: 0.3000 mL | Freq: Once | INTRAMUSCULAR | 0 refills | Status: DC

## 2021-01-14 MED ORDER — CLONIDINE HCL 0.3 MG PO TABS: 0.3000 mg | ORAL_TABLET | Freq: Two times a day (BID) | ORAL | 0 refills | Status: DC

## 2021-01-14 MED ORDER — CLOPIDOGREL BISULFATE 75 MG PO TABS: 1.0000 | ORAL_TABLET | Freq: Every day | ORAL | 0 refills | Status: DC

## 2021-01-14 MED ORDER — CLOPIDOGREL BISULFATE 75 MG PO TABS
1.0000 | ORAL_TABLET | Freq: Every day | ORAL | 0 refills | Status: DC
Start: 2021-01-14 — End: 2021-01-14

## 2021-01-14 MED ORDER — BUSPIRONE HCL 10 MG PO TABS: 10.0000 mg | ORAL_TABLET | Freq: Three times a day (TID) | ORAL | 0 refills | Status: DC

## 2021-01-14 MED ORDER — ROSUVASTATIN CALCIUM 40 MG PO TABS: 40.0000 mg | ORAL_TABLET | Freq: Every day | ORAL | 1 refills | Status: DC

## 2021-01-14 MED ORDER — ONDANSETRON 4 MG PO TBDP: ORAL_TABLET | ORAL | 0 refills | Status: DC

## 2021-01-14 NOTE — Telephone Encounter (Signed)
Called and canceled clonazepam from [facility] and [facility] to [facility] and also es[facility] all [facility] to [facility]

## 2021-01-14 NOTE — Progress Notes (Signed)
[facility] [address] [place], [place] [address]  Internal MEDICINE  Office Visit Note  Patient Name: Howard Weaver  [id number]  [id number]  Date of Service: 01/18/2021  Chief Complaint  Patient presents with  . Follow-up  . Hypertension  . Anxiety    HPI Oshay presents for a follow up visit regarding hypertension and anxiety. He is a 65 yo man with a history of MI, hypertension, anxiety, duodenal ulcer, CAD, SVT, and stroke.  Hypertension is controlled with current medication regimen.  Anxiety is manageable with current medications   Current Medication: Outpatient Encounter Medications as of 01/14/2021  Medication Sig  . COVID-19 mRNA Vac-TriS, Pfizer, SUSP injection Inject 0.3 mLs into the muscle once for 1 dose.  . [DISCONTINUED] aspirin EC 81 MG EC tablet Take 1 tablet (81 mg total) by mouth daily.  . [DISCONTINUED] benzonatate (TESSALON) 100 MG capsule Take 1 capsule (100 mg total) by mouth 2 (two) times daily.  . [DISCONTINUED] busPIRone (BUSPAR) 10 MG tablet Take 1 tablet (10 mg total) by mouth 3 (three) times daily.  . [DISCONTINUED] chlorthalidone (HYGROTON) 25 MG tablet Take 25 mg by mouth daily.  . [DISCONTINUED] clonazePAM (KLONOPIN) 0.25 MG disintegrating tablet Take 0.25 mg by mouth daily as needed for seizure.  . [DISCONTINUED] clonazePAM (KLONOPIN) 0.5 MG tablet Take(0.25 mg ) 1/2  tablet daily.  . [DISCONTINUED] cloNIDine (CATAPRES) 0.3 MG tablet Take 1 tablet (0.3 mg total) by mouth 2 (two) times daily.  . [DISCONTINUED] clopidogrel (PLAVIX) 75 MG tablet TAKE 1 TABLET BY MOUTH ONCE DAILY  . [DISCONTINUED] enalapril (VASOTEC) 20 MG tablet Take 1 tablet (20 mg total) by mouth daily.  . [DISCONTINUED] escitalopram (LEXAPRO) 10 MG tablet Take 10 mg by mouth daily.  . [DISCONTINUED] escitalopram (LEXAPRO) 20 MG tablet Take by mouth.  . [DISCONTINUED] HYDROcodone-homatropine (HYCODAN) 5-1.5 MG/5ML syrup Take 5 mLs by mouth every 6 (six) hours as  needed for cough.  . [DISCONTINUED] isosorbide mononitrate (IMDUR) 30 MG 24 hr tablet Take 30 mg by mouth daily.  . [DISCONTINUED] metoprolol succinate (TOPROL-XL) 100 MG 24 hr tablet TAKE 1 TABLET BY MOUTH ONCE DAILY  . [DISCONTINUED] ondansetron (ZOFRAN ODT) 4 MG disintegrating tablet Allow 1-2 tablets to dissolve in your mouth every 8 hours as needed for nausea/vomiting  . [DISCONTINUED] pantoprazole (PROTONIX) 40 MG tablet TAKE 1 TABLET BY MOUTH ONCE DAILY  . [DISCONTINUED] rosuvastatin (CRESTOR) 20 MG tablet Take 1 tablet (20 mg total) by mouth daily.  . isosorbide mononitrate (IMDUR) 30 MG 24 hr tablet Take 1 tablet (30 mg total) by mouth daily.  . [DISCONTINUED] aspirin 81 MG EC tablet Take 1 tablet (81 mg total) by mouth daily.  . [DISCONTINUED] busPIRone (BUSPAR) 10 MG tablet Take 1 tablet (10 mg total) by mouth 3 (three) times daily.  . [DISCONTINUED] chlorthalidone (HYGROTON) 25 MG tablet Take 1 tablet (25 mg total) by mouth daily.  . [DISCONTINUED] clonazePAM (KLONOPIN) 0.5 MG tablet Take(0.25 mg ) 1/2  tablet daily.  . [DISCONTINUED] cloNIDine (CATAPRES) 0.3 MG tablet Take 1 tablet (0.3 mg total) by mouth 2 (two) times daily.  . [DISCONTINUED] clopidogrel (PLAVIX) 75 MG tablet Take 1 tablet (75 mg total) by mouth daily.  . [DISCONTINUED] enalapril (VASOTEC) 20 MG tablet Take by mouth.  . [DISCONTINUED] enalapril (VASOTEC) 20 MG tablet Take 2 tablets (40 mg total) by mouth daily.  . [DISCONTINUED] escitalopram (LEXAPRO) 20 MG tablet Take 1 tablet (20 mg total) by mouth daily.  . [DISCONTINUED] metoprolol succinate (  TOPROL-XL) 100 MG 24 hr tablet Take by mouth.  . [DISCONTINUED] metoprolol succinate (TOPROL-XL) 100 MG 24 hr tablet Take 1 tablet (100 mg total) by mouth daily.  . [DISCONTINUED] ondansetron (ZOFRAN ODT) 4 MG disintegrating tablet Allow 1-2 tablets to dissolve in your mouth every 8 hours as needed for nausea/vomiting  . [DISCONTINUED] pantoprazole (PROTONIX) 40 MG tablet  Take 1 tablet (40 mg total) by mouth daily.  . [DISCONTINUED] rosuvastatin (CRESTOR) 40 MG tablet Take 40 mg by mouth daily.  . [DISCONTINUED] rosuvastatin (CRESTOR) 40 MG tablet Take 1 tablet (40 mg total) by mouth daily.   No facility-administered encounter medications on file as of 01/14/2021.    Surgical History: Past Surgical History:  Procedure Laterality Date  . BRAIN SURGERY    . CARDIAC CATHETERIZATION Right 11/25/2015   Procedure: Left Heart Cath and Coronary Angiography;  Surgeon: Laurier NancyShaukat A Khan, MD;  Location: [facility] INVASIVE CV LAB;  Service: Cardiovascular;  Laterality: Right;  . TEE WITHOUT CARDIOVERSION Right 01/17/2020   Procedure: TRANSESOPHAGEAL ECHOCARDIOGRAM (TEE);  Surgeon: Laurier NancyKhan, Shaukat A, MD;  Location: [facility] ORS;  Service: Cardiovascular;  Laterality: Right;    Medical History: Past Medical History:  Diagnosis Date  . CAD (coronary artery disease)   . Chest pain   . DU (duodenal ulcer)   . GAD (generalized anxiety disorder)   . Hypertension   . Myocardial infarction (HCC)   . Radiculopathy of thoracolumbar region   . SVT (supraventricular tachycardia) (HCC)   . Tachycardia     Family History: Family History  Problem Relation Age of Onset  . Hypertension Mother   . Diabetes Mother   . Depression Mother   . Hypertension Father     Social History   Socioeconomic History  . Marital status: Married    Spouse name: Not on file  . Number of children: Not on file  . Years of education: Not on file  . Highest education level: Not on file  Occupational History  . Not on file  Tobacco Use  . Smoking status: Former Smoker    Quit date: 11/25/1975    Years since quitting: 45.1  . Smokeless tobacco: Never Used  Vaping Use  . Vaping Use: Never used  Substance and Sexual Activity  . Alcohol use: No  . Drug use: No  . Sexual activity: Not on file  Other Topics Concern  . Not on file  Social History Narrative  . Not on file   Social Determinants of  Health   Financial Resource Strain: Not on file  Food Insecurity: Not on file  Transportation Needs: Not on file  Physical Activity: Not on file  Stress: Not on file  Social Connections: Not on file  Intimate Partner Violence: Not on file      Review of Systems  Constitutional: Negative for chills, fatigue and unexpected weight change.  HENT: Negative for congestion, rhinorrhea, sneezing and sore throat.   Eyes: Negative for redness.  Respiratory: Negative for cough, chest tightness and shortness of breath.   Cardiovascular: Negative for chest pain and palpitations.  Gastrointestinal: Negative for abdominal pain, constipation, diarrhea, nausea and vomiting.  Genitourinary: Negative for dysuria and frequency.  Musculoskeletal: Negative for arthralgias, back pain, joint swelling and neck pain.  Skin: Negative for rash.  Neurological: Negative.  Negative for tremors and numbness.  Hematological: Negative for adenopathy. Does not bruise/bleed easily.  Psychiatric/Behavioral: Negative for behavioral problems (Depression), sleep disturbance and suicidal ideas. The patient is not nervous/anxious.  Vital Signs: BP (!) 148/74   Pulse 72   Temp 98.7 F (37.1 C)   Resp 16   Ht 5\' 9"  (1.753 m)   Wt 219 lb 6.4 oz (99.5 kg)   SpO2 97%   BMI 32.40 kg/m    Physical Exam Constitutional:      General: He is not in acute distress.    Appearance: He is well-developed. He is obese. He is not diaphoretic.  HENT:     Head: Normocephalic and atraumatic.     Mouth/Throat:     Pharynx: No oropharyngeal exudate.  Eyes:     Pupils: Pupils are equal, round, and reactive to light.  Neck:     Thyroid: No thyromegaly.     Vascular: No JVD.     Trachea: No tracheal deviation.  Cardiovascular:     Rate and Rhythm: Normal rate and regular rhythm.     Pulses: Normal pulses.     Heart sounds: Normal heart sounds. No murmur heard. No friction rub. No gallop.   Pulmonary:     Effort:  Pulmonary effort is normal. No respiratory distress.     Breath sounds: No wheezing or rales.  Chest:     Chest wall: No tenderness.  Abdominal:     General: Bowel sounds are normal.     Palpations: Abdomen is soft.  Musculoskeletal:        General: Normal range of motion.     Cervical back: Normal range of motion and neck supple.  Lymphadenopathy:     Cervical: No cervical adenopathy.  Skin:    General: Skin is warm and dry.     Capillary Refill: Capillary refill takes less than 2 seconds.  Neurological:     Mental Status: He is alert and oriented to person, place, and time.     Cranial Nerves: No cranial nerve deficit.  Psychiatric:        Mood and Affect: Mood normal.        Behavior: Behavior normal.        Thought Content: Thought content normal.        Judgment: Judgment normal.    Assessment/Plan: 1. Essential hypertension Stable on current medications, refills requested by patient. -Chlorthalidone 25 mg tablet, take 1 tablet (25 mg) by mouth daily -clonidine 0.3 mg tablet, take 1 tablet (0.3 mg) by mouth twice daily. -enalapril 20 mg tablet, take 2 tablets (40 mg) by mouth daily.  -metoprolol succinate 100 mg 24 hr tablet, take 1 tablet (100 mg) by mouth daily.  2. Cerebrovascular accident (CVA), unspecified mechanism (HCC) History of CVA. Prevention of subsequent stroke managed with aspirin and clopidogrel. Refills ordered, Will need follow up studies, carotid dopplers  -  Aspirin 81 mg EC tablet, take 1 tablet (81 mg) by mouth daily.  -clopidigrel 75 mg tablet, take 1 tablet by mouth daily  3. Peptic ulcer of stomach, chronic History of peptic ulcer disease, managed with pantoprazole and ondansetron, refills ordered. Pantoprazole 40 mg tablet, take 1 tablet (40 mg) by mouth daily Ondansetron 4 mg ODT, allow 1-2 tablets to dissolve in your mouth every 8 hours as needed for nausea and vomiting.   4. GAD (generalized anxiety disorder) History of anxiety managed with  buspirone and escitalopram. Refills ordered.  Buspirone 10 mg tablet, take 1 tablet (10 mg) by mouth 3 times daily   General Counseling: Amaan verbalizes understanding of the findings of todays visit and agrees with plan of treatment. I have discussed any further  diagnostic evaluation that may be needed or ordered today. We also reviewed his medications today. he has been encouraged to call the office with any questions or concerns that should arise related to todays visit.    No orders of the defined types were placed in this encounter.   Meds ordered this encounter  Medications  . DISCONTD: aspirin 81 MG EC tablet    Sig: Take 1 tablet (81 mg total) by mouth daily.    Dispense:  90 tablet    Refill:  0  . DISCONTD: busPIRone (BUSPAR) 10 MG tablet    Sig: Take 1 tablet (10 mg total) by mouth 3 (three) times daily.    Dispense:  90 tablet    Refill:  0  . DISCONTD: chlorthalidone (HYGROTON) 25 MG tablet    Sig: Take 1 tablet (25 mg total) by mouth daily.    Dispense:  90 tablet    Refill:  0  . DISCONTD: clonazePAM (KLONOPIN) 0.5 MG tablet    Sig: Take(0.25 mg ) 1/2  tablet daily.    Dispense:  90 tablet    Refill:  0    Order Specific Question:   Supervising Provider    Answer:   Lyndon Code [[id number]]  . DISCONTD: cloNIDine (CATAPRES) 0.3 MG tablet    Sig: Take 1 tablet (0.3 mg total) by mouth 2 (two) times daily.    Dispense:  180 tablet    Refill:  0  . DISCONTD: clopidogrel (PLAVIX) 75 MG tablet    Sig: Take 1 tablet (75 mg total) by mouth daily.    Dispense:  90 tablet    Refill:  0    NEED FOR MED PACKS NEED 90 DAYS SUPPLY..Pt needs appt for further refills  . DISCONTD: enalapril (VASOTEC) 20 MG tablet    Sig: Take 2 tablets (40 mg total) by mouth daily.    Dispense:  180 tablet    Refill:  0  . DISCONTD: escitalopram (LEXAPRO) 20 MG tablet    Sig: Take 1 tablet (20 mg total) by mouth daily.    Dispense:  90 tablet    Refill:  1  . isosorbide mononitrate (IMDUR)  30 MG 24 hr tablet    Sig: Take 1 tablet (30 mg total) by mouth daily.    Dispense:  90 tablet    Refill:  1  . DISCONTD: metoprolol succinate (TOPROL-XL) 100 MG 24 hr tablet    Sig: Take 1 tablet (100 mg total) by mouth daily.    Dispense:  90 tablet    Refill:  0  . DISCONTD: ondansetron (ZOFRAN ODT) 4 MG disintegrating tablet    Sig: Allow 1-2 tablets to dissolve in your mouth every 8 hours as needed for nausea/vomiting    Dispense:  180 tablet    Refill:  0  . DISCONTD: pantoprazole (PROTONIX) 40 MG tablet    Sig: Take 1 tablet (40 mg total) by mouth daily.    Dispense:  90 tablet    Refill:  0    Pt needs appt for further refills  . DISCONTD: rosuvastatin (CRESTOR) 40 MG tablet    Sig: Take 1 tablet (40 mg total) by mouth daily.    Dispense:  90 tablet    Refill:  1  . COVID-19 mRNA Vac-TriS, Pfizer, SUSP injection    Sig: Inject 0.3 mLs into the muscle once for 1 dose.    Dispense:  0.3 mL    Refill:  0  Has had first 2 pfizer COVID vaccinations, needs dose #3, 1st booster, please administer at the pharmacy   Return in about 3 months (around 04/16/2021) for F/U, med refill, Lupita Rosales PCP.   Total time spent:30 Minutes Time spent includes review of chart, medications, test results, and follow up plan with the patient.   [place] Controlled Substance Database was reviewed by me.  This patient was seen by Sallyanne Kuster, FNP-C in collaboration with Dr. Beverely Risen as a part of collaborative care agreement.  Dr Lyndon Code Internal medicine

## 2021-01-15 DIAGNOSIS — I2510 Atherosclerotic heart disease of native coronary artery without angina pectoris: Secondary | ICD-10-CM | POA: Diagnosis not present

## 2021-01-15 DIAGNOSIS — E782 Mixed hyperlipidemia: Secondary | ICD-10-CM | POA: Diagnosis not present

## 2021-01-15 DIAGNOSIS — R0602 Shortness of breath: Secondary | ICD-10-CM | POA: Diagnosis not present

## 2021-01-15 DIAGNOSIS — I10 Essential (primary) hypertension: Secondary | ICD-10-CM | POA: Diagnosis not present

## 2021-02-19 ENCOUNTER — Other Ambulatory Visit: Payer: Self-pay | Admitting: Nurse Practitioner

## 2021-02-19 DIAGNOSIS — F411 Generalized anxiety disorder: Secondary | ICD-10-CM

## 2021-03-17 DIAGNOSIS — E669 Obesity, unspecified: Secondary | ICD-10-CM | POA: Diagnosis not present

## 2021-03-17 DIAGNOSIS — I351 Nonrheumatic aortic (valve) insufficiency: Secondary | ICD-10-CM | POA: Diagnosis not present

## 2021-03-17 DIAGNOSIS — I340 Nonrheumatic mitral (valve) insufficiency: Secondary | ICD-10-CM | POA: Diagnosis not present

## 2021-03-17 DIAGNOSIS — I2510 Atherosclerotic heart disease of native coronary artery without angina pectoris: Secondary | ICD-10-CM | POA: Diagnosis not present

## 2021-03-17 DIAGNOSIS — I10 Essential (primary) hypertension: Secondary | ICD-10-CM | POA: Diagnosis not present

## 2021-03-17 DIAGNOSIS — I6389 Other cerebral infarction: Secondary | ICD-10-CM | POA: Diagnosis not present

## 2021-03-17 DIAGNOSIS — E782 Mixed hyperlipidemia: Secondary | ICD-10-CM | POA: Diagnosis not present

## 2021-03-20 ENCOUNTER — Other Ambulatory Visit: Payer: Self-pay

## 2021-03-20 ENCOUNTER — Other Ambulatory Visit: Payer: Self-pay | Admitting: Internal Medicine

## 2021-03-20 DIAGNOSIS — F411 Generalized anxiety disorder: Secondary | ICD-10-CM

## 2021-04-09 ENCOUNTER — Other Ambulatory Visit: Payer: Self-pay | Admitting: Internal Medicine

## 2021-04-09 ENCOUNTER — Other Ambulatory Visit: Payer: Self-pay | Admitting: Nurse Practitioner

## 2021-04-09 DIAGNOSIS — I639 Cerebral infarction, unspecified: Secondary | ICD-10-CM

## 2021-04-09 DIAGNOSIS — K257 Chronic gastric ulcer without hemorrhage or perforation: Secondary | ICD-10-CM

## 2021-04-09 DIAGNOSIS — F411 Generalized anxiety disorder: Secondary | ICD-10-CM

## 2021-04-15 ENCOUNTER — Ambulatory Visit: Payer: Medicare HMO | Admitting: Nurse Practitioner

## 2021-05-28 ENCOUNTER — Other Ambulatory Visit: Payer: Self-pay | Admitting: Nurse Practitioner

## 2021-05-28 ENCOUNTER — Telehealth: Payer: Self-pay

## 2021-05-28 ENCOUNTER — Other Ambulatory Visit: Payer: Self-pay | Admitting: Internal Medicine

## 2021-05-28 DIAGNOSIS — F411 Generalized anxiety disorder: Secondary | ICD-10-CM

## 2021-05-28 NOTE — Telephone Encounter (Signed)
Unable to reach patient with either telephone # listed to schedule annual cpe. I scheduled appointment and mailed appointment reminder to [name]

## 2021-06-25 ENCOUNTER — Other Ambulatory Visit: Payer: Self-pay | Admitting: Nurse Practitioner

## 2021-07-24 ENCOUNTER — Other Ambulatory Visit: Payer: Self-pay | Admitting: Internal Medicine

## 2021-07-24 DIAGNOSIS — I639 Cerebral infarction, unspecified: Secondary | ICD-10-CM

## 2021-07-24 DIAGNOSIS — K257 Chronic gastric ulcer without hemorrhage or perforation: Secondary | ICD-10-CM

## 2021-08-06 ENCOUNTER — Other Ambulatory Visit: Payer: Self-pay | Admitting: Nurse Practitioner

## 2021-08-28 ENCOUNTER — Encounter: Payer: Self-pay | Admitting: Nurse Practitioner

## 2021-08-28 ENCOUNTER — Other Ambulatory Visit: Payer: Self-pay

## 2021-08-28 ENCOUNTER — Ambulatory Visit (INDEPENDENT_AMBULATORY_CARE_PROVIDER_SITE_OTHER): Payer: Medicare HMO | Admitting: Nurse Practitioner

## 2021-08-28 ENCOUNTER — Telehealth: Payer: Self-pay

## 2021-08-28 VITALS — BP 106/75 | HR 57 | Temp 98.4°F | Resp 16 | Ht 69.0 in | Wt 223.6 lb

## 2021-08-28 DIAGNOSIS — F411 Generalized anxiety disorder: Secondary | ICD-10-CM

## 2021-08-28 DIAGNOSIS — R300 Dysuria: Secondary | ICD-10-CM

## 2021-08-28 DIAGNOSIS — I10 Essential (primary) hypertension: Secondary | ICD-10-CM

## 2021-08-28 DIAGNOSIS — N1831 Chronic kidney disease, stage 3a: Secondary | ICD-10-CM | POA: Diagnosis not present

## 2021-08-28 DIAGNOSIS — Z0001 Encounter for general adult medical examination with abnormal findings: Secondary | ICD-10-CM | POA: Diagnosis not present

## 2021-08-28 DIAGNOSIS — E782 Mixed hyperlipidemia: Secondary | ICD-10-CM

## 2021-08-28 DIAGNOSIS — Z8673 Personal history of transient ischemic attack (TIA), and cerebral infarction without residual deficits: Secondary | ICD-10-CM

## 2021-08-28 DIAGNOSIS — K257 Chronic gastric ulcer without hemorrhage or perforation: Secondary | ICD-10-CM

## 2021-08-28 DIAGNOSIS — Z125 Encounter for screening for malignant neoplasm of prostate: Secondary | ICD-10-CM

## 2021-08-28 DIAGNOSIS — E559 Vitamin D deficiency, unspecified: Secondary | ICD-10-CM

## 2021-08-28 MED ORDER — ROSUVASTATIN CALCIUM 40 MG PO TABS: 40.0000 mg | ORAL_TABLET | Freq: Every day | ORAL | 1 refills | Status: DC

## 2021-08-28 MED ORDER — ESCITALOPRAM OXALATE 20 MG PO TABS: 20.0000 mg | ORAL_TABLET | Freq: Every day | ORAL | 1 refills | Status: DC

## 2021-08-28 MED ORDER — CLONAZEPAM 0.5 MG PO TABS: ORAL_TABLET | ORAL | 0 refills | Status: DC

## 2021-08-28 MED ORDER — ISOSORBIDE MONONITRATE ER 30 MG PO TB24: 30.0000 mg | ORAL_TABLET | Freq: Every day | ORAL | 1 refills | Status: DC

## 2021-08-28 MED ORDER — CHLORTHALIDONE 25 MG PO TABS
25.0000 mg | ORAL_TABLET | Freq: Every day | ORAL | 1 refills | Status: DC
Start: 2021-08-28 — End: 2022-06-10

## 2021-08-28 MED ORDER — CLOPIDOGREL BISULFATE 75 MG PO TABS: 75.0000 mg | ORAL_TABLET | Freq: Every day | ORAL | 1 refills | Status: DC

## 2021-08-28 MED ORDER — METOPROLOL SUCCINATE ER 100 MG PO TB24: 100.0000 mg | ORAL_TABLET | Freq: Every day | ORAL | 1 refills | Status: DC

## 2021-08-28 MED ORDER — ENALAPRIL MALEATE 20 MG PO TABS: 40.0000 mg | ORAL_TABLET | Freq: Every day | ORAL | 1 refills | Status: DC

## 2021-08-28 MED ORDER — BUSPIRONE HCL 15 MG PO TABS: 15.0000 mg | ORAL_TABLET | Freq: Two times a day (BID) | ORAL | 5 refills | Status: DC

## 2021-08-28 MED ORDER — PANTOPRAZOLE SODIUM 40 MG PO TBEC: 40.0000 mg | DELAYED_RELEASE_TABLET | Freq: Every day | ORAL | 1 refills | Status: DC

## 2021-08-28 MED ORDER — BUSPIRONE HCL 15 MG PO TABS
ORAL_TABLET | ORAL | 5 refills | Status: DC
Start: 2021-08-28 — End: 2022-09-08

## 2021-08-28 MED ORDER — CLONIDINE HCL 0.3 MG PO TABS: 0.3000 mg | ORAL_TABLET | Freq: Two times a day (BID) | ORAL | 1 refills | Status: DC

## 2021-08-28 MED ORDER — ONDANSETRON 4 MG PO TBDP: ORAL_TABLET | ORAL | 0 refills | Status: DC

## 2021-08-28 MED ORDER — ASPIRIN 81 MG PO TBEC: 81.0000 mg | DELAYED_RELEASE_TABLET | Freq: Every day | ORAL | 1 refills | Status: DC

## 2021-08-28 NOTE — Telephone Encounter (Signed)
error 

## 2021-08-28 NOTE — Progress Notes (Signed)
[facility], [place] [address]  Internal MEDICINE  Office Visit Note  Patient Name: Howard Weaver  [id number]  [id number]  Date of Service: 08/28/2021  Chief Complaint  Patient presents with   Medicare Wellness   Hypertension   Anxiety    Anxiety attacks have been getting worse, every other day    HPI Howard Weaver presents for an annual well visit and physical exam.He is a well appearing 65 yo male. He has hypertension and anxiety. He reports that his anxiety level has been higher and he has had panic attack more often. He needs medication refills. He is not due for colonoscopy until 2028. He is due for routine labs and PSA level.     Current Medication: Outpatient Encounter Medications as of 08/28/2021  Medication Sig   PREVNAR 20 0.5 ML SUSY    [DISCONTINUED] aspirin 81 MG EC tablet Take 1 tablet (81 mg total) by mouth daily.   [DISCONTINUED] busPIRone (BUSPAR) 10 MG tablet TAKE 1 TABLET BY MOUTH 3 TIMES DAILY   [DISCONTINUED] busPIRone (BUSPAR) 15 MG tablet Take 1-2 tablets (15-30 mg total) by mouth 2 (two) times daily.   [DISCONTINUED] chlorthalidone (HYGROTON) 25 MG tablet TAKE 1 TABLET BY MOUTH DAILY   [DISCONTINUED] clonazePAM (KLONOPIN) 0.5 MG tablet Take(0.25 mg ) 1/2  tablet daily.   [DISCONTINUED] cloNIDine (CATAPRES) 0.3 MG tablet TAKE 1 TABLET BY MOUTH TWICE A DAY   [DISCONTINUED] clopidogrel (PLAVIX) 75 MG tablet TAKE 1 TABLET BY MOUTH DAILY   [DISCONTINUED] enalapril (VASOTEC) 20 MG tablet TAKE 2 TABLETS BY MOUTH DAILY   [DISCONTINUED] isosorbide mononitrate (IMDUR) 30 MG 24 hr tablet Take 1 tablet (30 mg total) by mouth daily.   [DISCONTINUED] metoprolol succinate (TOPROL-XL) 100 MG 24 hr tablet TAKE 1 TABLET BY MOUTH DAILY   [DISCONTINUED] ondansetron (ZOFRAN ODT) 4 MG disintegrating tablet Allow 1-2 tablets to dissolve in your mouth every 8 hours as needed for nausea/vomiting   [DISCONTINUED] pantoprazole (PROTONIX) 40 MG tablet  TAKE 1 TABLET BY MOUTH DAILY   [DISCONTINUED] rosuvastatin (CRESTOR) 40 MG tablet Take 1 tablet (40 mg total) by mouth daily.   chlorthalidone (HYGROTON) 25 MG tablet Take 1 tablet (25 mg total) by mouth daily.   clonazePAM (KLONOPIN) 0.5 MG tablet Take(0.25 mg ) 1/2  tablet daily.   clopidogrel (PLAVIX) 75 MG tablet Take 1 tablet (75 mg total) by mouth daily.   COVID-19 mRNA Vac-TriS, Pfizer, SUSP injection Inject 0.3 mLs into the muscle once for 1 dose.   enalapril (VASOTEC) 20 MG tablet Take 2 tablets (40 mg total) by mouth daily.   escitalopram (LEXAPRO) 20 MG tablet Take 1 tablet (20 mg total) by mouth daily.   isosorbide mononitrate (IMDUR) 30 MG 24 hr tablet Take 1 tablet (30 mg total) by mouth daily.   metoprolol succinate (TOPROL-XL) 100 MG 24 hr tablet Take 1 tablet (100 mg total) by mouth daily. Take with or immediately following a meal.   ondansetron (ZOFRAN ODT) 4 MG disintegrating tablet Allow 1-2 tablets to dissolve in your mouth every 8 hours as needed for nausea/vomiting   pantoprazole (PROTONIX) 40 MG tablet Take 1 tablet (40 mg total) by mouth daily.   rosuvastatin (CRESTOR) 40 MG tablet Take 1 tablet (40 mg total) by mouth daily.   [DISCONTINUED] clonazePAM (KLONOPIN) 0.5 MG tablet Take(0.25 mg ) 1/2  tablet daily.   [DISCONTINUED] escitalopram (LEXAPRO) 20 MG tablet Take 1 tablet (20 mg total) by mouth daily.   No  facility-administered encounter medications on file as of 08/28/2021.    Surgical History: Past Surgical History:  Procedure Laterality Date   BRAIN SURGERY     CARDIAC CATHETERIZATION Right 11/25/2015   Procedure: Left Heart Cath and Coronary Angiography;  Surgeon: Dionisio David, MD;  Location: [facility];  Service: Cardiovascular;  Laterality: Right;   TEE WITHOUT CARDIOVERSION Right 01/17/2020   Procedure: TRANSESOPHAGEAL ECHOCARDIOGRAM (TEE);  Surgeon: Dionisio David, MD;  Location: [facility];  Service: Cardiovascular;  Laterality: Right;     Medical History: Past Medical History:  Diagnosis Date   CAD (coronary artery disease)    Chest pain    DU (duodenal ulcer)    GAD (generalized anxiety disorder)    Hypertension    Myocardial infarction ([place])    Radiculopathy of thoracolumbar region    SVT (supraventricular tachycardia) (HCC)    Tachycardia     Family History: Family History  Problem Relation Age of Onset   Hypertension Mother    Diabetes Mother    Depression Mother    Hypertension Father     Social History   Socioeconomic History   Marital status: Married    Spouse name: Not on file   Number of children: Not on file   Years of education: Not on file   Highest education level: Not on file  Occupational History   Not on file  Tobacco Use   Smoking status: Former    Types: Cigarettes    Quit date: 11/25/1975    Years since quitting: 45.7   Smokeless tobacco: Never  Vaping Use   Vaping Use: Never used  Substance and Sexual Activity   Alcohol use: No   Drug use: No   Sexual activity: Not on file  Other Topics Concern   Not on file  Social History Narrative   Not on file   Social Determinants of Health   Financial Resource Strain: Not on file  Food Insecurity: Not on file  Transportation Needs: Not on file  Physical Activity: Not on file  Stress: Not on file  Social Connections: Not on file  Intimate Partner Violence: Not on file      Review of Systems  Constitutional:  Negative for activity change, appetite change, chills, fatigue, fever and unexpected weight change.  HENT: Negative.  Negative for congestion, ear pain, rhinorrhea, sore throat and trouble swallowing.   Eyes: Negative.   Respiratory: Negative.  Negative for cough, chest tightness, shortness of breath and wheezing.   Cardiovascular: Negative.  Negative for chest pain.  Gastrointestinal: Negative.  Negative for abdominal pain, blood in stool, constipation, diarrhea, nausea and vomiting.  Endocrine: Negative.    Genitourinary: Negative.  Negative for difficulty urinating, dysuria, frequency, hematuria and urgency.  Musculoskeletal: Negative.  Negative for arthralgias, back pain, joint swelling, myalgias and neck pain.  Skin: Negative.  Negative for rash and wound.  Allergic/Immunologic: Negative.  Negative for immunocompromised state.  Neurological: Negative.  Negative for dizziness, seizures, numbness and headaches.  Hematological: Negative.   Psychiatric/Behavioral: Negative.  Negative for behavioral problems, self-injury and suicidal ideas. The patient is not nervous/anxious.    Vital Signs: BP 106/75    Pulse (!) 57    Temp 98.4 F (36.9 C)    Resp 16    Ht '5\' 9"'  (1.753 m)    Wt 223 lb 9.6 oz (101.4 kg)    SpO2 100%    BMI 33.02 kg/m    Physical Exam Constitutional:  General: He is not in acute distress.    Appearance: Normal appearance. He is well-developed. He is obese. He is not ill-appearing or diaphoretic.  HENT:     Head: Normocephalic and atraumatic.     Right Ear: Tympanic membrane, ear canal and external ear normal.     Left Ear: Tympanic membrane, ear canal and external ear normal.     Nose: Nose normal. No congestion or rhinorrhea.     Mouth/Throat:     Mouth: Mucous membranes are moist.     Dentition: Abnormal dentition. Gingival swelling and dental caries present.     Pharynx: Oropharynx is clear. No oropharyngeal exudate or posterior oropharyngeal erythema.     Comments: He is missing several teeth and the remaining teeth are decayed with multiple dental caries.  Eyes:     General: Lids are normal. Vision grossly intact. Gaze aligned appropriately. No scleral icterus.       Right eye: No discharge.        Left eye: No discharge.     Extraocular Movements: Extraocular movements intact.     Conjunctiva/sclera: Conjunctivae normal.     Pupils: Pupils are equal, round, and reactive to light.     Funduscopic exam:    Right eye: Red reflex present.        Left eye: Red  reflex present. Neck:     Thyroid: No thyromegaly.     Vascular: No JVD.     Trachea: Trachea and phonation normal. No tracheal deviation.  Cardiovascular:     Rate and Rhythm: Normal rate and regular rhythm.     Pulses: Normal pulses.     Heart sounds: Normal heart sounds, S1 normal and S2 normal. No murmur heard.   No friction rub. No gallop.  Pulmonary:     Effort: Pulmonary effort is normal. No accessory muscle usage or respiratory distress.     Breath sounds: Normal breath sounds and air entry. No stridor. No wheezing or rales.  Chest:     Chest wall: No tenderness.  Abdominal:     General: Bowel sounds are normal. There is no distension.     Palpations: Abdomen is soft. There is no shifting dullness, fluid wave, mass or pulsatile mass.     Tenderness: There is no abdominal tenderness. There is no guarding or rebound.  Musculoskeletal:        General: No tenderness or deformity. Normal range of motion.     Cervical back: Normal range of motion and neck supple.     Right lower leg: No edema.     Left lower leg: No edema.  Lymphadenopathy:     Cervical: No cervical adenopathy.  Skin:    General: Skin is warm and dry.     Capillary Refill: Capillary refill takes less than 2 seconds.     Coloration: Skin is not pale.     Findings: No erythema or rash.  Neurological:     Mental Status: He is alert and oriented to person, place, and time.     Cranial Nerves: No cranial nerve deficit.     Motor: No abnormal muscle tone.     Coordination: Coordination normal.     Gait: Gait normal.     Deep Tendon Reflexes: Reflexes are normal and symmetric.  Psychiatric:        Mood and Affect: Mood normal.        Behavior: Behavior normal.        Thought Content: Thought content normal.  Judgment: Judgment normal.       Assessment/Plan: 1. Encounter for general adult medical examination with abnormal findings Age-appropriate preventive screenings and vaccinations discussed,  annual physical exam completed. Routine labs for health maintenance ordered, see below. PHM updated.  - TSH + free T4  2. history of CVA Refills ordered.  - clopidogrel (PLAVIX) 75 MG tablet; Take 1 tablet (75 mg total) by mouth daily.  Dispense: 90 tablet; Refill: 1  3. Essential hypertension Refills ordered, stable with current medications, continue as prescribed. Routine labs ordered.  - CBC with Differential/Platelet - CMP14+EGFR - TSH + free T4 - chlorthalidone (HYGROTON) 25 MG tablet; Take 1 tablet (25 mg total) by mouth daily.  Dispense: 90 tablet; Refill: 1 - enalapril (VASOTEC) 20 MG tablet; Take 2 tablets (40 mg total) by mouth daily.  Dispense: 180 tablet; Refill: 1  4. Mixed hyperlipidemia Refill ordered, routine labs ordered.  - CMP14+EGFR - Lipid Profile - TSH + free T4 - rosuvastatin (CRESTOR) 40 MG tablet; Take 1 tablet (40 mg total) by mouth daily.  Dispense: 90 tablet; Refill: 1  5. Peptic ulcer of stomach, chronic Refill ordered, routine lab ordered.  - CBC with Differential/Platelet - pantoprazole (PROTONIX) 40 MG tablet; Take 1 tablet (40 mg total) by mouth daily.  Dispense: 90 tablet; Refill: 1  6. Stage 3a chronic kidney disease ([place]) Routine labs ordered - CBC with Differential/Platelet - Urinalysis, Routine w reflex microscopic - CMP14+EGFR - TSH + free T4  7. Vitamin D deficiency Routine labs ordered - Vitamin D (25 hydroxy) - TSH + free T4  8. Dysuria Routine urinalysis done - UA/M w/rflx Culture, Routine - Urinalysis, Routine w reflex microscopic  9. GAD (generalized anxiety disorder) Refills ordered - escitalopram (LEXAPRO) 20 MG tablet; Take 1 tablet (20 mg total) by mouth daily.  Dispense: 90 tablet; Refill: 1  10. Encounter for screening for malignant neoplasm of prostate Routine lab ordered - PSA Total (Reflex To Free)       General Counseling: Arjan verbalizes understanding of the findings of todays visit and agrees with  plan of treatment. I have discussed any further diagnostic evaluation that may be needed or ordered today. We also reviewed his medications today. he has been encouraged to call the office with any questions or concerns that should arise related to todays visit.    Orders Placed This Encounter  Procedures   UA/M w/rflx Culture, Routine   CBC with Differential/Platelet   Urinalysis, Routine w reflex microscopic   CMP14+EGFR   Lipid Profile   Vitamin D (25 hydroxy)   PSA Total (Reflex To Free)   TSH + free T4    Meds ordered this encounter  Medications   rosuvastatin (CRESTOR) 40 MG tablet    Sig: Take 1 tablet (40 mg total) by mouth daily.    Dispense:  90 tablet    Refill:  1   isosorbide mononitrate (IMDUR) 30 MG 24 hr tablet    Sig: Take 1 tablet (30 mg total) by mouth daily.    Dispense:  90 tablet    Refill:  1   escitalopram (LEXAPRO) 20 MG tablet    Sig: Take 1 tablet (20 mg total) by mouth daily.    Dispense:  90 tablet    Refill:  1   DISCONTD: busPIRone (BUSPAR) 15 MG tablet    Sig: Take 1-2 tablets (15-30 mg total) by mouth 2 (two) times daily.    Dispense:  120 tablet    Refill:  5   chlorthalidone (HYGROTON) 25 MG tablet    Sig: Take 1 tablet (25 mg total) by mouth daily.    Dispense:  90 tablet    Refill:  1    Pt needs appt for further refills   DISCONTD: clonazePAM (KLONOPIN) 0.5 MG tablet    Sig: Take(0.25 mg ) 1/2  tablet daily.    Dispense:  90 tablet    Refill:  0   clopidogrel (PLAVIX) 75 MG tablet    Sig: Take 1 tablet (75 mg total) by mouth daily.    Dispense:  90 tablet    Refill:  1    Pt needs appt for further refills   enalapril (VASOTEC) 20 MG tablet    Sig: Take 2 tablets (40 mg total) by mouth daily.    Dispense:  180 tablet    Refill:  1   metoprolol succinate (TOPROL-XL) 100 MG 24 hr tablet    Sig: Take 1 tablet (100 mg total) by mouth daily. Take with or immediately following a meal.    Dispense:  90 tablet    Refill:  1    Pt  needs appt for further refills   ondansetron (ZOFRAN ODT) 4 MG disintegrating tablet    Sig: Allow 1-2 tablets to dissolve in your mouth every 8 hours as needed for nausea/vomiting    Dispense:  180 tablet    Refill:  0   pantoprazole (PROTONIX) 40 MG tablet    Sig: Take 1 tablet (40 mg total) by mouth daily.    Dispense:  90 tablet    Refill:  1    Pt needs appt for further refills   clonazePAM (KLONOPIN) 0.5 MG tablet    Sig: Take(0.25 mg ) 1/2  tablet daily.    Dispense:  90 tablet    Refill:  0    Return in about 6 months (around 02/26/2022) for F/U, med refill, Brinn Westby PCP.   Total time spent:30 Minutes Time spent includes review of chart, medications, test results, and follow up plan with the patient.   [place] Controlled Substance Database was reviewed by me.  This patient was seen by Jonetta Osgood, FNP-C in collaboration with Dr. Clayborn Bigness as a part of collaborative care agreement.  Taniyah Ballow R. Valetta Fuller, MSN, FNP-C Internal medicine

## 2021-09-10 DIAGNOSIS — I2510 Atherosclerotic heart disease of native coronary artery without angina pectoris: Secondary | ICD-10-CM | POA: Diagnosis not present

## 2021-09-10 DIAGNOSIS — I10 Essential (primary) hypertension: Secondary | ICD-10-CM | POA: Diagnosis not present

## 2021-09-10 DIAGNOSIS — I351 Nonrheumatic aortic (valve) insufficiency: Secondary | ICD-10-CM | POA: Diagnosis not present

## 2021-09-10 DIAGNOSIS — E782 Mixed hyperlipidemia: Secondary | ICD-10-CM | POA: Diagnosis not present

## 2021-09-10 DIAGNOSIS — I340 Nonrheumatic mitral (valve) insufficiency: Secondary | ICD-10-CM | POA: Diagnosis not present

## 2021-09-24 DIAGNOSIS — K37 Unspecified appendicitis: Secondary | ICD-10-CM

## 2021-09-24 HISTORY — DX: Unspecified appendicitis: K37

## 2021-09-25 ENCOUNTER — Encounter: Payer: Self-pay | Admitting: Emergency Medicine

## 2021-09-25 ENCOUNTER — Inpatient Hospital Stay
Admission: EM | Admit: 2021-09-25 | Discharge: 2021-09-28 | DRG: 393 | Disposition: A | Discharge status: Home / Self Care | Payer: Medicare Other | Attending: Surgery | Admitting: Surgery

## 2021-09-25 ENCOUNTER — Encounter
Admission: EM | Disposition: A | Discharge status: Home / Self Care | Payer: Self-pay | Source: Home / Self Care | Attending: Surgery

## 2021-09-25 ENCOUNTER — Emergency Department: Payer: Medicare Other

## 2021-09-25 ENCOUNTER — Other Ambulatory Visit: Payer: Self-pay

## 2021-09-25 DIAGNOSIS — Z8249 Family history of ischemic heart disease and other diseases of the circulatory system: Secondary | ICD-10-CM | POA: Diagnosis not present

## 2021-09-25 DIAGNOSIS — I129 Hypertensive chronic kidney disease with stage 1 through stage 4 chronic kidney disease, or unspecified chronic kidney disease: Secondary | ICD-10-CM | POA: Diagnosis present

## 2021-09-25 DIAGNOSIS — R0902 Hypoxemia: Secondary | ICD-10-CM | POA: Diagnosis not present

## 2021-09-25 DIAGNOSIS — Z8673 Personal history of transient ischemic attack (TIA), and cerebral infarction without residual deficits: Secondary | ICD-10-CM

## 2021-09-25 DIAGNOSIS — Z23 Encounter for immunization: Secondary | ICD-10-CM | POA: Diagnosis not present

## 2021-09-25 DIAGNOSIS — Z7902 Long term (current) use of antithrombotics/antiplatelets: Secondary | ICD-10-CM | POA: Diagnosis not present

## 2021-09-25 DIAGNOSIS — I252 Old myocardial infarction: Secondary | ICD-10-CM

## 2021-09-25 DIAGNOSIS — N1831 Chronic kidney disease, stage 3a: Secondary | ICD-10-CM | POA: Diagnosis present

## 2021-09-25 DIAGNOSIS — F411 Generalized anxiety disorder: Secondary | ICD-10-CM | POA: Diagnosis present

## 2021-09-25 DIAGNOSIS — K3580 Unspecified acute appendicitis: Secondary | ICD-10-CM | POA: Diagnosis not present

## 2021-09-25 DIAGNOSIS — Z833 Family history of diabetes mellitus: Secondary | ICD-10-CM

## 2021-09-25 DIAGNOSIS — I2510 Atherosclerotic heart disease of native coronary artery without angina pectoris: Secondary | ICD-10-CM | POA: Diagnosis present

## 2021-09-25 DIAGNOSIS — K760 Fatty (change of) liver, not elsewhere classified: Secondary | ICD-10-CM | POA: Diagnosis present

## 2021-09-25 DIAGNOSIS — Z7982 Long term (current) use of aspirin: Secondary | ICD-10-CM | POA: Diagnosis not present

## 2021-09-25 DIAGNOSIS — I499 Cardiac arrhythmia, unspecified: Secondary | ICD-10-CM | POA: Diagnosis not present

## 2021-09-25 DIAGNOSIS — Z6832 Body mass index (BMI) 32.0-32.9, adult: Secondary | ICD-10-CM

## 2021-09-25 DIAGNOSIS — Z818 Family history of other mental and behavioral disorders: Secondary | ICD-10-CM

## 2021-09-25 DIAGNOSIS — R799 Abnormal finding of blood chemistry, unspecified: Secondary | ICD-10-CM | POA: Diagnosis not present

## 2021-09-25 DIAGNOSIS — Z87891 Personal history of nicotine dependence: Secondary | ICD-10-CM

## 2021-09-25 DIAGNOSIS — Z79899 Other long term (current) drug therapy: Secondary | ICD-10-CM | POA: Diagnosis not present

## 2021-09-25 DIAGNOSIS — Z880 Allergy status to penicillin: Secondary | ICD-10-CM | POA: Diagnosis not present

## 2021-09-25 DIAGNOSIS — U071 COVID-19: Secondary | ICD-10-CM | POA: Diagnosis not present

## 2021-09-25 DIAGNOSIS — R6889 Other general symptoms and signs: Secondary | ICD-10-CM | POA: Diagnosis not present

## 2021-09-25 DIAGNOSIS — I700 Atherosclerosis of aorta: Secondary | ICD-10-CM | POA: Diagnosis not present

## 2021-09-25 DIAGNOSIS — E669 Obesity, unspecified: Secondary | ICD-10-CM | POA: Diagnosis present

## 2021-09-25 DIAGNOSIS — R000 Tachycardia, unspecified: Secondary | ICD-10-CM | POA: Diagnosis not present

## 2021-09-25 DIAGNOSIS — R109 Unspecified abdominal pain: Secondary | ICD-10-CM | POA: Diagnosis not present

## 2021-09-25 HISTORY — DX: Unspecified acute appendicitis: K35.80

## 2021-09-25 LAB — CBC WITH DIFFERENTIAL/PLATELET
Abs Immature Granulocytes: 0.03 10*3/uL (ref 0.00–0.07)
Basophils Absolute: 0 10*3/uL (ref 0.0–0.1)
Basophils Relative: 0 %
Eosinophils Absolute: 0.4 10*3/uL (ref 0.0–0.5)
Eosinophils Relative: 4 %
HCT: 52.9 % — ABNORMAL HIGH (ref 39.0–52.0)
Hemoglobin: 17.8 g/dL — ABNORMAL HIGH (ref 13.0–17.0)
Immature Granulocytes: 0 %
Lymphocytes Relative: 15 %
Lymphs Abs: 1.4 10*3/uL (ref 0.7–4.0)
MCH: 29.2 pg (ref 26.0–34.0)
MCHC: 33.6 g/dL (ref 30.0–36.0)
MCV: 86.7 fL (ref 80.0–100.0)
Monocytes Absolute: 0.9 10*3/uL (ref 0.1–1.0)
Monocytes Relative: 9 %
Neutro Abs: 6.9 10*3/uL (ref 1.7–7.7)
Neutrophils Relative %: 72 %
Platelets: 217 10*3/uL (ref 150–400)
RBC: 6.1 MIL/uL — ABNORMAL HIGH (ref 4.22–5.81)
RDW: 13.4 % (ref 11.5–15.5)
WBC: 9.6 10*3/uL (ref 4.0–10.5)
nRBC: 0 % (ref 0.0–0.2)

## 2021-09-25 LAB — COMPREHENSIVE METABOLIC PANEL
ALT: 17 U/L (ref 0–44)
AST: 24 U/L (ref 15–41)
Albumin: 4.4 g/dL (ref 3.5–5.0)
Alkaline Phosphatase: 50 U/L (ref 38–126)
Anion gap: 14 (ref 5–15)
BUN: 19 mg/dL (ref 8–23)
CO2: 23 mmol/L (ref 22–32)
Calcium: 10.7 mg/dL — ABNORMAL HIGH (ref 8.9–10.3)
Chloride: 100 mmol/L (ref 98–111)
Creatinine, Ser: 1.39 mg/dL — ABNORMAL HIGH (ref 0.61–1.24)
GFR, Estimated: 56 mL/min — ABNORMAL LOW (ref >60)
Glucose, Bld: 167 mg/dL — ABNORMAL HIGH (ref 70–99)
Potassium: 3.8 mmol/L (ref 3.5–5.1)
Sodium: 137 mmol/L (ref 135–145)
Total Bilirubin: 1.2 mg/dL (ref 0.3–1.2)
Total Protein: 8.6 g/dL — ABNORMAL HIGH (ref 6.5–8.1)

## 2021-09-25 LAB — URINALYSIS, ROUTINE W REFLEX MICROSCOPIC
Bilirubin Urine: NEGATIVE
Glucose, UA: NEGATIVE mg/dL
Ketones, ur: 15 mg/dL — AB
Leukocytes,Ua: NEGATIVE
Nitrite: NEGATIVE
Protein, ur: 30 mg/dL — AB
Specific Gravity, Urine: 1.01 (ref 1.005–1.030)
pH: 7 (ref 5.0–8.0)

## 2021-09-25 LAB — LIPASE, BLOOD: Lipase: 28 U/L (ref 11–51)

## 2021-09-25 LAB — RESP PANEL BY RT-PCR (FLU A&B, COVID) ARPGX2
Influenza A by PCR: NEGATIVE
Influenza B by PCR: NEGATIVE
SARS Coronavirus 2 by RT PCR: POSITIVE — AB

## 2021-09-25 LAB — URINALYSIS, MICROSCOPIC (REFLEX): Bacteria, UA: NONE SEEN

## 2021-09-25 SURGERY — APPENDECTOMY, ROBOT-ASSISTED, LAPAROSCOPIC
Anesthesia: General

## 2021-09-25 MED ORDER — ENOXAPARIN SODIUM 60 MG/0.6ML IJ SOSY
0.5000 mg/kg | PREFILLED_SYRINGE | INTRAMUSCULAR | Status: DC
  Administered 2021-09-25 – 2021-09-27 (×3): 50 mg via SUBCUTANEOUS
  Filled 2021-09-25 (×3): qty 0.6

## 2021-09-25 MED ORDER — CIPROFLOXACIN IN D5W 400 MG/200ML IV SOLN
400.0000 mg | Freq: Two times a day (BID) | INTRAVENOUS | Status: DC
  Administered 2021-09-25 – 2021-09-26 (×3): 400 mg via INTRAVENOUS
  Filled 2021-09-25 (×4): qty 200

## 2021-09-25 MED ORDER — BUSPIRONE HCL 15 MG PO TABS
15.0000 mg | ORAL_TABLET | Freq: Two times a day (BID) | ORAL | Status: DC
  Administered 2021-09-25 – 2021-09-28 (×6): 15 mg via ORAL
  Filled 2021-09-25 (×7): qty 1

## 2021-09-25 MED ORDER — CIPROFLOXACIN IN D5W 400 MG/200ML IV SOLN
400.0000 mg | Freq: Once | INTRAVENOUS | Status: AC
  Administered 2021-09-25: 400 mg via INTRAVENOUS
  Filled 2021-09-25: qty 200

## 2021-09-25 MED ORDER — CHLORTHALIDONE 25 MG PO TABS
25.0000 mg | ORAL_TABLET | Freq: Every day | ORAL | Status: DC
  Administered 2021-09-25 – 2021-09-26 (×2): 25 mg via ORAL
  Filled 2021-09-25 (×3): qty 1

## 2021-09-25 MED ORDER — PANTOPRAZOLE SODIUM 40 MG PO TBEC
40.0000 mg | DELAYED_RELEASE_TABLET | Freq: Every day | ORAL | Status: DC
  Administered 2021-09-26 – 2021-09-28 (×3): 40 mg via ORAL
  Filled 2021-09-25 (×3): qty 1

## 2021-09-25 MED ORDER — ROSUVASTATIN CALCIUM 20 MG PO TABS
40.0000 mg | ORAL_TABLET | Freq: Every day | ORAL | Status: DC
  Administered 2021-09-25 – 2021-09-27 (×3): 40 mg via ORAL
  Filled 2021-09-25 (×4): qty 2

## 2021-09-25 MED ORDER — METRONIDAZOLE 500 MG/100ML IV SOLN
500.0000 mg | Freq: Two times a day (BID) | INTRAVENOUS | Status: DC
  Administered 2021-09-25 – 2021-09-26 (×3): 500 mg via INTRAVENOUS
  Filled 2021-09-25 (×4): qty 100

## 2021-09-25 MED ORDER — METOPROLOL SUCCINATE ER 50 MG PO TB24
100.0000 mg | ORAL_TABLET | Freq: Every day | ORAL | Status: DC
  Administered 2021-09-25 – 2021-09-27 (×3): 100 mg via ORAL
  Filled 2021-09-25 (×4): qty 2

## 2021-09-25 MED ORDER — ACETAMINOPHEN 500 MG PO TABS
1000.0000 mg | ORAL_TABLET | Freq: Four times a day (QID) | ORAL | Status: DC
  Administered 2021-09-25 – 2021-09-28 (×8): 1000 mg via ORAL
  Filled 2021-09-25 (×8): qty 2

## 2021-09-25 MED ORDER — ONDANSETRON HCL 4 MG/2ML IJ SOLN
4.0000 mg | Freq: Once | INTRAMUSCULAR | Status: AC
  Administered 2021-09-25: 4 mg via INTRAVENOUS
  Filled 2021-09-25: qty 2

## 2021-09-25 MED ORDER — SODIUM CHLORIDE 0.9 % IV SOLN: INTRAVENOUS | Status: DC

## 2021-09-25 MED ORDER — MORPHINE SULFATE (PF) 2 MG/ML IV SOLN: 2.0000 mg | INTRAVENOUS | Status: DC | PRN

## 2021-09-25 MED ORDER — IOHEXOL 300 MG/ML  SOLN
100.0000 mL | Freq: Once | INTRAMUSCULAR | Status: AC | PRN
  Administered 2021-09-25: 100 mL via INTRAVENOUS

## 2021-09-25 MED ORDER — LACTATED RINGERS IV BOLUS
1000.0000 mL | Freq: Once | INTRAVENOUS | Status: AC
  Administered 2021-09-25: 1000 mL via INTRAVENOUS

## 2021-09-25 MED ORDER — OXYCODONE HCL 5 MG PO TABS: 5.0000 mg | ORAL_TABLET | ORAL | Status: DC | PRN

## 2021-09-25 MED ORDER — ONDANSETRON 4 MG PO TBDP: 4.0000 mg | ORAL_TABLET | Freq: Four times a day (QID) | ORAL | Status: DC | PRN

## 2021-09-25 MED ORDER — CLONIDINE HCL 0.1 MG PO TABS
0.3000 mg | ORAL_TABLET | Freq: Two times a day (BID) | ORAL | Status: DC
  Administered 2021-09-25 – 2021-09-28 (×6): 0.3 mg via ORAL
  Filled 2021-09-25 (×6): qty 3

## 2021-09-25 MED ORDER — METRONIDAZOLE 500 MG/100ML IV SOLN
500.0000 mg | Freq: Once | INTRAVENOUS | Status: AC
  Administered 2021-09-25: 500 mg via INTRAVENOUS
  Filled 2021-09-25: qty 100

## 2021-09-25 MED ORDER — MORPHINE SULFATE (PF) 4 MG/ML IV SOLN
4.0000 mg | Freq: Once | INTRAVENOUS | Status: AC
  Administered 2021-09-25: 4 mg via INTRAVENOUS
  Filled 2021-09-25: qty 1

## 2021-09-25 MED ORDER — ONDANSETRON HCL 4 MG/2ML IJ SOLN: 4.0000 mg | Freq: Four times a day (QID) | INTRAMUSCULAR | Status: DC | PRN

## 2021-09-25 MED ORDER — HYDRALAZINE HCL 10 MG PO TABS
10.0000 mg | ORAL_TABLET | Freq: Four times a day (QID) | ORAL | Status: DC | PRN
  Filled 2021-09-25: qty 1

## 2021-09-25 MED ORDER — CLONAZEPAM 0.25 MG PO TBDP
0.2500 mg | ORAL_TABLET | Freq: Every day | ORAL | Status: DC
  Administered 2021-09-25 – 2021-09-27 (×3): 0.25 mg via ORAL
  Filled 2021-09-25 (×3): qty 1

## 2021-09-25 MED ORDER — ESCITALOPRAM OXALATE 20 MG PO TABS
20.0000 mg | ORAL_TABLET | Freq: Every day | ORAL | Status: DC
  Administered 2021-09-26 – 2021-09-28 (×3): 20 mg via ORAL
  Filled 2021-09-25 (×3): qty 1

## 2021-09-25 MED ORDER — ISOSORBIDE MONONITRATE ER 60 MG PO TB24
30.0000 mg | ORAL_TABLET | Freq: Every day | ORAL | Status: DC
  Administered 2021-09-25 – 2021-09-28 (×4): 30 mg via ORAL
  Filled 2021-09-25 (×4): qty 1

## 2021-09-25 MED ORDER — ENALAPRIL MALEATE 10 MG PO TABS
40.0000 mg | ORAL_TABLET | Freq: Every day | ORAL | Status: DC
  Administered 2021-09-25: 40 mg via ORAL
  Filled 2021-09-25 (×3): qty 4

## 2021-09-25 MED ORDER — PNEUMOCOCCAL VAC POLYVALENT 25 MCG/0.5ML IJ INJ
0.5000 mL | INJECTION | INTRAMUSCULAR | Status: AC
  Administered 2021-09-26: 0.5 mL via INTRAMUSCULAR
  Filled 2021-09-25: qty 0.5

## 2021-09-25 MED ORDER — METOPROLOL TARTRATE 5 MG/5ML IV SOLN
5.0000 mg | Freq: Four times a day (QID) | INTRAVENOUS | Status: DC | PRN
  Administered 2021-09-25: 5 mg via INTRAVENOUS
  Filled 2021-09-25: qty 5

## 2021-09-25 NOTE — H&P (Signed)
[facility] SURGICAL HISTORY & PHYSICAL (cpt [phone])  HISTORY OF PRESENT ILLNESS (HPI):  66 y.o. male presented to [facility] ED today for abdominal pain. Patient reports around a 48 hour history of RLQ abdominal pain with associated decreased appetite, nausea, and emesis in the ED. The pain has been constant and radiates to his back. He described this as sharp and stabbing in nature. Nothing seemed to give him any relief. He denied any associated fever, chills, SOB, CP, urinary changes, or bowel changes. No history of similar in the past. He does report a significant abdominal surgical history including open gastric ulcer repair. Additionally of note, he is also on Plavix secondary to what he reports as "stents in his neck." Upon chart review, he does follow with Dr Gurney Maxin [facility]), and he is on ASA and Plavix for CVA prevention. Additional pertinent medical history includes CAD, MI, HTN, and CKD. Work up in the ED revealed a normal WBC at 9.6K, he is hemo-concentrated with Hgb 17.8, renal function seems to be at baseline with sCr - 1.39. CT Abdomen/pelvis was concerning for acute uncomplicated appendicitis.   General surgery is consulted by emergency medicine physician Dr Blake Divine, MD for evaluation and management of acute appendicitis.   PAST MEDICAL HISTORY (PMH):  Past Medical History:  Diagnosis Date   CAD (coronary artery disease)    Chest pain    DU (duodenal ulcer)    GAD (generalized anxiety disorder)    Hypertension    Myocardial infarction (HCC)    Radiculopathy of thoracolumbar region    SVT (supraventricular tachycardia) (HCC)    Tachycardia     Reviewed. Otherwise negative.   PAST SURGICAL HISTORY [facility]):  Past Surgical History:  Procedure Laterality Date   BRAIN SURGERY     CARDIAC CATHETERIZATION Right 11/25/2015   Procedure: Left Heart Cath and Coronary Angiography;  Surgeon: Dionisio David, MD;  Location: [place] [facility];  Service:  Cardiovascular;  Laterality: Right;   TEE WITHOUT CARDIOVERSION Right 01/17/2020   Procedure: TRANSESOPHAGEAL ECHOCARDIOGRAM (TEE);  Surgeon: Dionisio David, MD;  Location: [facility] ORS;  Service: Cardiovascular;  Laterality: Right;    Reviewed. Otherwise negative.   MEDICATIONS:  Prior to Admission medications   Medication Sig Start Date End Date Taking? Authorizing Provider  aspirin 81 MG EC tablet Take 1 tablet (81 mg total) by mouth daily. 08/28/21   Jonetta Osgood, NP  busPIRone (BUSPAR) 15 MG tablet Take 15 mg twice a day 08/28/21   Jonetta Osgood, NP  chlorthalidone (HYGROTON) 25 MG tablet Take 1 tablet (25 mg total) by mouth daily. 08/28/21   Jonetta Osgood, NP  clonazePAM (KLONOPIN) 0.5 MG tablet Take(0.25 mg ) 1/2  tablet daily. 08/28/21   Jonetta Osgood, NP  cloNIDine (CATAPRES) 0.3 MG tablet Take 1 tablet (0.3 mg total) by mouth 2 (two) times daily. 08/28/21   Jonetta Osgood, NP  clopidogrel (PLAVIX) 75 MG tablet Take 1 tablet (75 mg total) by mouth daily. 08/28/21   Jonetta Osgood, NP  COVID-19 mRNA Vac-TriS, Pfizer, SUSP injection Inject 0.3 mLs into the muscle once for 1 dose. 01/14/21 01/14/21  Jonetta Osgood, NP  enalapril (VASOTEC) 20 MG tablet Take 2 tablets (40 mg total) by mouth daily. 08/28/21   Jonetta Osgood, NP  escitalopram (LEXAPRO) 20 MG tablet Take 1 tablet (20 mg total) by mouth daily. 08/28/21 02/24/22  Jonetta Osgood, NP  isosorbide mononitrate (IMDUR) 30 MG 24 hr tablet Take 1 tablet (30 mg total) by mouth  daily. 08/28/21   Jonetta Osgood, NP  metoprolol succinate (TOPROL-XL) 100 MG 24 hr tablet Take 1 tablet (100 mg total) by mouth daily. Take with or immediately following a meal. 08/28/21   Jonetta Osgood, NP  ondansetron (ZOFRAN ODT) 4 MG disintegrating tablet Allow 1-2 tablets to dissolve in your mouth every 8 hours as needed for nausea/vomiting 08/28/21   Abernathy, Yetta Flock, NP  pantoprazole (PROTONIX) 40 MG tablet Take 1 tablet (40  mg total) by mouth daily. 08/28/21   Jonetta Osgood, NP  PREVNAR 20 0.5 ML SUSY  01/14/21   [provider]  rosuvastatin (CRESTOR) 40 MG tablet Take 1 tablet (40 mg total) by mouth daily. 08/28/21   Jonetta Osgood, NP     ALLERGIES:  Allergies  Allergen Reactions   Penicillins Anaphylaxis    Has patient had a PCN reaction causing immediate rash, facial/tongue/throat swelling, SOB or lightheadedness with hypotension: Yes Has patient had a PCN reaction causing severe rash involving mucus membranes or skin necrosis: Yes Has patient had a PCN reaction that required hospitalization Yes Has patient had a PCN reaction occurring within the last 10 years: No If all of the above answers are "NO", then may proceed with Cephalosporin use.  Other reaction(s): Other (See Comments) Other reaction(s): Unknown Has patient had a PCN reaction causing immediate rash, facial/tongue/throat swelling, SOB or lightheadedness with hypotension: Yes Has patient had a PCN reaction causing severe rash involving mucus membranes or skin necrosis: Yes Has patient had a PCN reaction that required hospitalization Yes Has patient had a PCN reaction occurring within the last 10 years: No If all of the above answers are "NO", then may proceed with Cephalosporin use.   Ibuprofen Other (See Comments)    Gi upset Other reaction(s): Other (See Comments) Other reaction(s): Unknown Gi upset     SOCIAL HISTORY:  Social History   Socioeconomic History   Marital status: Married    Spouse name: Not on file   Number of children: Not on file   Years of education: Not on file   Highest education level: Not on file  Occupational History   Not on file  Tobacco Use   Smoking status: Former    Types: Cigarettes    Quit date: 11/25/1975    Years since quitting: 45.8   Smokeless tobacco: Never  Vaping Use   Vaping Use: Never used  Substance and Sexual Activity   Alcohol use: No   Drug use: No   Sexual  activity: Not on file  Other Topics Concern   Not on file  Social History Narrative   Not on file   Social Determinants of Health   Financial Resource Strain: Not on file  Food Insecurity: Not on file  Transportation Needs: Not on file  Physical Activity: Not on file  Stress: Not on file  Social Connections: Not on file  Intimate Partner Violence: Not on file     FAMILY HISTORY:  Family History  Problem Relation Age of Onset   Hypertension Mother    Diabetes Mother    Depression Mother    Hypertension Father     Otherwise negative.   REVIEW OF SYSTEMS:  Review of Systems  Constitutional:  Negative for chills and fever.       + Decreased Appetite   HENT:  Negative for congestion and sore throat.   Respiratory:  Negative for cough and shortness of breath.   Cardiovascular:  Negative for chest pain and palpitations.  Gastrointestinal:  Positive  for abdominal pain, nausea and vomiting. Negative for constipation and diarrhea.  Genitourinary:  Negative for dysuria and urgency.  All other systems reviewed and are negative.  VITAL SIGNS:  Temp:  [98.3 F (36.8 C)-98.5 F (36.9 C)] 98.5 F (36.9 C) (01/27 1216) Pulse Rate:  [105-153] 105 (01/27 1216) Resp:  [14-20] 17 (01/27 1216) BP: (139-190)/(78-140) 185/78 (01/27 1216) SpO2:  [95 %-99 %] 98 % (01/27 1216) Weight:  [99.8 kg] 99.8 kg (01/27 1026)     Height: 5\' 9"  (175.3 cm) Weight: 99.8 kg BMI (Calculated): 32.47   PHYSICAL EXAM:  Physical Exam Vitals and nursing note reviewed.  Constitutional:      General: He is not in acute distress.    Appearance: He is well-developed. He is obese. He is not ill-appearing.  HENT:     Head: Normocephalic and atraumatic.  Eyes:     General: No scleral icterus.    Extraocular Movements: Extraocular movements intact.  Cardiovascular:     Rate and Rhythm: Regular rhythm. Tachycardia present.     Heart sounds: Normal heart sounds. No murmur heard. Pulmonary:     Effort:  Pulmonary effort is normal. No respiratory distress.  Abdominal:     General: Abdomen is protuberant. There is no distension.     Palpations: Abdomen is soft.     Tenderness: There is abdominal tenderness in the right lower quadrant. There is no guarding or rebound. Positive signs include McBurney's sign.     Comments: Abdomen is protuberant consistent with degree of obesity, tenderness in the RLQ, non-distended, no rebound/guarding   Genitourinary:    Comments: Deferred Skin:    General: Skin is warm and dry.     Coloration: Skin is not jaundiced or pale.  Neurological:     General: No focal deficit present.     Mental Status: He is alert and oriented to person, place, and time.  Psychiatric:        Mood and Affect: Mood normal.        Behavior: Behavior normal.    INTAKE/OUTPUT:  This shift: No intake/output data recorded.  Last 2 shifts: @IOLAST2SHIFTS @  Labs:  CBC Latest Ref Rng & Units 09/25/2021 08/23/2020 01/15/2020  WBC 4.0 - 10.5 K/uL 9.6 5.1 8.0  Hemoglobin 13.0 - 17.0 g/dL 17.8(H) 16.6 14.3  Hematocrit 39.0 - 52.0 % 52.9(H) 49.2 42.7  Platelets 150 - 400 K/uL 217 177 218   CMP Latest Ref Rng & Units 09/25/2021 08/23/2020 01/15/2020  Glucose 70 - 99 mg/dL 167(H) 142(H) 114(H)  BUN 8 - 23 mg/dL 19 20 21   Creatinine 0.61 - 1.24 mg/dL 1.39(H) 1.46(H) 1.28(H)  Sodium 135 - 145 mmol/L 137 134(L) 138  Potassium 3.5 - 5.1 mmol/L 3.8 3.8 3.6  Chloride 98 - 111 mmol/L 100 98 103  CO2 22 - 32 mmol/L 23 22 26   Calcium 8.9 - 10.3 mg/dL 10.7(H) 9.8 9.2  Total Protein 6.5 - 8.1 g/dL 8.6(H) 8.5(H) -  Total Bilirubin 0.3 - 1.2 mg/dL 1.2 0.6 -  Alkaline Phos 38 - 126 U/L 50 47 -  AST 15 - 41 U/L 24 41 -  ALT 0 - 44 U/L 17 32 -     Imaging studies:   CT Abdomen/Pelvis (09/25/2021) personally reviewed with acute uncomplicated appendicitis, no abscess, no free air, and radiologist report reviewed below:  IMPRESSION: 1. Acute uncomplicated appendicitis. 2. Hepatic steatosis. 3.   Aortic Atherosclerosis (ICD10-I70.0).   Assessment/Plan: (ICD-10's: K35.80) 66 y.o. male with RLQ abdominal pain found  to have acute uncomplicated appendicitis, complicated by pertinent comorbidities including need for ASA and Plavix for stroke prevention, history of CVA, CAD, MI, CKD.    - Will admit to general surgery  - Given his significant comorbidities and need for Plavix, we will forgo on any urgent surgical intervention at this time. He is certainly not peritonitic nor toxic from this currently. Pending on how hie improves, we may be able to proceed with appendectomy in a few days or in the interval fashion as an outpatient. He understands that should he fail to improve or clinically deteriorate we would proceed in a more urgent fashion, and he is at increased risk for bleeding complications given the Plavix use.   Faythe Ghee for CLD; IVF Resuscitation - Continue IV Abx (Cipro/Flagyl); has anaphylaxis to PCN - Monitor abdominal examination; on-going bowel function - Pain control prn; antiemetics prn   - Morning CBC; CMP   - DVT prophylaxis; hold Eliquis   All of the above findings and recommendations were discussed with the patient, and all of his questions were answered to his expressed satisfaction.  -- Edison Simon, PA-C [facility] 09/25/2021, 12:32 PM [phone] M-F: 7am - 4pm

## 2021-09-25 NOTE — Plan of Care (Signed)

## 2021-09-25 NOTE — ED Triage Notes (Signed)
Pt via [facility] from home. Pt c/o RLQ abd pain that started yesterday that radiates to the R flank. Denies any NVD. Denies urinary symptoms. Denies fever. Pt on arrival is nausea and vomiting. Pt has PMH of ulcers.   EMS report 200/108, 115 HR., 98.1, 98%, and 20 RR. Pt is A&OX4 and NAD>

## 2021-09-25 NOTE — ED Provider Notes (Signed)
[facility] Provider Note    Event Date/Time   First MD Initiated Contact with Patient 09/25/21 1036     (approximate)   History   Chief Complaint Abdominal Pain (Rlq pain nausea/)   HPI  Howard Weaver is a 66 y.o. male with past medical history of hypertension, CAD, CKD, stroke, peptic ulcer disease, and generalized anxiety disorder who presents to the ED complaining of abdominal pain.  Patient reports that he has had 2 days of constant pain in the right lower quadrant of his abdomen radiating up towards his right flank.  He describes the pain as sharp and stabbing, not exacerbated or alleviated by anything in particular.  It is been associated with nausea and he has had multiple episodes of vomiting today, also states he has not had a bowel movement in the past 24 hours.  He denies any blood in his emesis or stool recently, has not noticed any dysuria or hematuria.  He describes history of similar symptoms in the past, has required surgery for perforated gastric ulcer but states he still has his gallbladder and appendix.     Physical Exam   Triage Vital Signs: ED Triage Vitals  Enc Vitals Group     BP 09/25/21 1023 139/78     Pulse Rate 09/25/21 1023 (!) 153     Resp 09/25/21 1023 20     Temp 09/25/21 1023 98.3 F (36.8 C)     Temp Source 09/25/21 1023 Oral     SpO2 09/25/21 1023 99 %     Weight 09/25/21 1026 220 lb (99.8 kg)     Height 09/25/21 1026 5\' 9"  (1.753 m)     Head Circumference --      Peak Flow --      Pain Score 09/25/21 1023 6     Pain Loc --      Pain Edu? --      Excl. in GC? --     Most recent vital signs: Vitals:   09/25/21 1026 09/25/21 1216  BP: (!) 190/140 (!) 185/78  Pulse: (!) 121 (!) 105  Resp: 14 17  Temp:  98.5 F (36.9 C)  SpO2: 95% 98%    Constitutional: Alert and oriented. Eyes: Conjunctivae are normal. Head: Atraumatic. Nose: No congestion/rhinnorhea. Mouth/Throat: Mucous membranes are  moist. Cardiovascular: Tachycardic, regular rhythm. Grossly normal heart sounds.  2+ radial pulses bilaterally. Respiratory: Normal respiratory effort.  No retractions. Lungs CTAB. Gastrointestinal: Soft and tender to palpation in the right lower quadrant with no rebound or guarding.  No CVA tenderness bilaterally.  No distention. Musculoskeletal: No lower extremity tenderness nor edema.  Neurologic:  Normal speech and language. No gross focal neurologic deficits are appreciated.    ED Results / Procedures / Treatments   Labs (all labs ordered are listed, but only abnormal results are displayed) Labs Reviewed  CBC WITH DIFFERENTIAL/PLATELET - Abnormal; Notable for the following components:      Result Value   RBC 6.10 (*)    Hemoglobin 17.8 (*)    HCT 52.9 (*)    All other components within normal limits  COMPREHENSIVE METABOLIC PANEL - Abnormal; Notable for the following components:   Glucose, Bld 167 (*)    Creatinine, Ser 1.39 (*)    Calcium 10.7 (*)    Total Protein 8.6 (*)    GFR, Estimated 56 (*)    All other components within normal limits  RESP PANEL BY RT-PCR (FLU A&B, COVID) ARPGX2  LIPASE, BLOOD  URINALYSIS, ROUTINE W REFLEX MICROSCOPIC     EKG  ED ECG REPORT I, Blake Divine, the attending physician, personally viewed and interpreted this ECG.   Date: 09/25/2021  EKG Time: 10:31  Rate: 108  Rhythm: sinus tachycardia, occasional PVCs  Axis: Normal  Intervals:none  ST&T Change: None  RADIOLOGY CT of abdomen/pelvis reviewed by me with inflammatory changes in the RLQ concerning for appendicitis.  PROCEDURES:  Critical Care performed: No  .1-3 Lead EKG Interpretation Performed by: Blake Divine, MD Authorized by: Blake Divine, MD     Interpretation: abnormal     ECG rate:  100-120   ECG rate assessment: tachycardic     Rhythm: sinus rhythm     Ectopy: none     Conduction: normal     MEDICATIONS ORDERED IN ED: Medications  ciprofloxacin  (CIPRO) IVPB 400 mg (has no administration in time range)  metroNIDAZOLE (FLAGYL) IVPB 500 mg (has no administration in time range)  morphine 4 MG/ML injection 4 mg (4 mg Intravenous Given 09/25/21 1052)  ondansetron (ZOFRAN) injection 4 mg (4 mg Intravenous Given 09/25/21 1050)  lactated ringers bolus 1,000 mL (1,000 mLs Intravenous New Bag/Given 09/25/21 1049)  iohexol (OMNIPAQUE) 300 MG/ML solution 100 mL (100 mLs Intravenous Contrast Given 09/25/21 1141)     IMPRESSION / MDM / ASSESSMENT AND PLAN / ED COURSE  I reviewed the triage vital signs and the nursing notes.                              66 y.o. male with past medical history of hypertension, CAD, CKD, peptic ulcer disease, stroke, and generalized anxiety disorder who presents to the ED complaining of 2 days of increasing pain in the right lower quadrant of his abdomen radiating up towards his right flank.  Differential diagnosis includes, but is not limited to, appendicitis, kidney stone, pyelonephritis, bowel obstruction, diverticulitis, colitis, cystitis, viral syndrome.  Patient nontoxic-appearing and in no acute distress, vitals remarkable for tachycardia and hypertension.  EKG shows sinus tachycardia with no ischemic changes and low suspicion for cardiac etiology for her symptoms.  With his focal right lower quadrant tenderness, we will further assess with CT scan, check labs including LFTs and lipase.  Urinalysis is pending, we will treat symptomatically with IV morphine and Zofran, hydrate with IV fluids.  The patient is on the cardiac monitor to evaluate for evidence of arrhythmia and/or significant heart rate changes.  CT scan is remarkable for acute uncomplicated appendicitis, given patient's penicillin allergy we will treat with Cipro and Flagyl.  Case discussed with Dr. Christian Mate of general surgery, who will evaluate the patient.      FINAL CLINICAL IMPRESSION(S) / ED DIAGNOSES   Final diagnoses:  Acute appendicitis,  unspecified acute appendicitis type     Rx / DC Orders   ED Discharge Orders     None        Note:  This document was prepared using Dragon voice recognition software and may include unintentional dictation errors.   Blake Divine, MD 09/25/21 [phone]

## 2021-09-25 NOTE — Progress Notes (Signed)
PHARMACIST - PHYSICIAN COMMUNICATION  CONCERNING:  Enoxaparin (Lovenox) for DVT Prophylaxis    RECOMMENDATION: Patient was prescribed enoxaparin 40mg  q24 hours for VTE prophylaxis.   Filed Weights   09/25/21 1026  Weight: 99.8 kg (220 lb)    Body mass index is 32.49 kg/m.  Estimated Creatinine Clearance: 60.9 mL/min (A) (by C-G formula based on SCr of 1.39 mg/dL (H)).   Based on [place] patient is candidate for enoxaparin 0.5mg /kg TBW SQ every 24 hours based on BMI being >30.  DESCRIPTION: Pharmacy has adjusted enoxaparin dose per [facility] policy.  Patient is now receiving enoxaparin 50 mg every 24 hours   Benita Gutter 09/25/2021 2:10 PM

## 2021-09-26 LAB — COMPREHENSIVE METABOLIC PANEL
ALT: 13 U/L (ref 0–44)
AST: 18 U/L (ref 15–41)
Albumin: 3.7 g/dL (ref 3.5–5.0)
Alkaline Phosphatase: 38 U/L (ref 38–126)
Anion gap: 8 (ref 5–15)
BUN: 23 mg/dL (ref 8–23)
CO2: 28 mmol/L (ref 22–32)
Calcium: 9.4 mg/dL (ref 8.9–10.3)
Chloride: 101 mmol/L (ref 98–111)
Creatinine, Ser: 1.79 mg/dL — ABNORMAL HIGH (ref 0.61–1.24)
GFR, Estimated: 41 mL/min — ABNORMAL LOW (ref >60)
Glucose, Bld: 116 mg/dL — ABNORMAL HIGH (ref 70–99)
Potassium: 4.3 mmol/L (ref 3.5–5.1)
Sodium: 137 mmol/L (ref 135–145)
Total Bilirubin: 0.8 mg/dL (ref 0.3–1.2)
Total Protein: 6.9 g/dL (ref 6.5–8.1)

## 2021-09-26 LAB — CBC
HCT: 44.3 % (ref 39.0–52.0)
Hemoglobin: 14.4 g/dL (ref 13.0–17.0)
MCH: 29.1 pg (ref 26.0–34.0)
MCHC: 32.5 g/dL (ref 30.0–36.0)
MCV: 89.7 fL (ref 80.0–100.0)
Platelets: 179 10*3/uL (ref 150–400)
RBC: 4.94 MIL/uL (ref 4.22–5.81)
RDW: 13.7 % (ref 11.5–15.5)
WBC: 6.5 10*3/uL (ref 4.0–10.5)
nRBC: 0 % (ref 0.0–0.2)

## 2021-09-26 NOTE — Progress Notes (Signed)
[facility] SURGICAL PROGRESS NOTE (cpt [phone])  Hospital Day(s): 1.   Post op day(s):  Marland Kitchen   Interval History: Patient seen and examined, no acute events or new complaints overnight. Patient reports feeling significantly better, denies abdominal pain, fevers or chills, nausea or vomiting.  Appears to be tolerating clear liquid diet fine but has no appetite to proceed/advance.  Review of Systems:  Constitutional: denies fever, chills  HEENT: denies cough or congestion  Respiratory: denies any shortness of breath  Cardiovascular: denies chest pain or palpitations  Gastrointestinal: denies abdominal pain, N/V, or diarrhea/and bowel function as per interval history Genitourinary: denies burning with urination or urinary frequency Musculoskeletal: denies pain, decreased motor or sensation Integumentary: denies any other rashes or skin discolorations Neurological: denies HA or vision/hearing changes   Vital signs in last 24 hours: [min-max] current  Temp:  [98 F (36.7 C)-98.6 F (37 C)] 98.2 F (36.8 C) (01/28 0734) Pulse Rate:  [58-140] 64 (01/28 1039) Resp:  [17-20] 18 (01/28 0734) BP: (111-208)/(71-151) 121/84 (01/28 1039) SpO2:  [97 %-99 %] 99 % (01/28 1039)     Height: 5\' 9"  (175.3 cm) Weight: 99.8 kg BMI (Calculated): 32.47   Intake/Output last 2 shifts:  01/27 0701 - 01/28 0700 In: 2048.8 [I.V.:650; IV Piggyback:1398.8] Out: -    Physical Exam:  Constitutional: alert, cooperative and no distress  HENT: normocephalic without obvious abnormality  Eyes: PERRL, EOM's grossly intact and symmetric  Neuro: CN II - XII grossly intact and symmetric without deficit  Respiratory: breathing non-labored at rest  Cardiovascular: regular rate and sinus rhythm  Gastrointestinal: soft, non-tender, and non-distended, no peritoneal signs. Musculoskeletal: UE and LE FROM, no edema or wounds, motor and sensation grossly intact, NT    Labs:  CBC Latest Ref Rng & Units  09/26/2021 09/25/2021 08/23/2020  WBC 4.0 - 10.5 K/uL 6.5 9.6 5.1  Hemoglobin 13.0 - 17.0 g/dL 14.4 17.8(H) 16.6  Hematocrit 39.0 - 52.0 % 44.3 52.9(H) 49.2  Platelets 150 - 400 K/uL 179 217 177   CMP Latest Ref Rng & Units 09/26/2021 09/25/2021 08/23/2020  Glucose 70 - 99 mg/dL 116(H) 167(H) 142(H)  BUN 8 - 23 mg/dL 23 19 20   Creatinine 0.61 - 1.24 mg/dL 1.79(H) 1.39(H) 1.46(H)  Sodium 135 - 145 mmol/L 137 137 134(L)  Potassium 3.5 - 5.1 mmol/L 4.3 3.8 3.8  Chloride 98 - 111 mmol/L 101 100 98  CO2 22 - 32 mmol/L 28 23 22   Calcium 8.9 - 10.3 mg/dL 9.4 10.7(H) 9.8  Total Protein 6.5 - 8.1 g/dL 6.9 8.6(H) 8.5(H)  Total Bilirubin 0.3 - 1.2 mg/dL 0.8 1.2 0.6  Alkaline Phos 38 - 126 U/L 38 50 47  AST 15 - 41 U/L 18 24 41  ALT 0 - 44 U/L 13 17 32     Imaging studies: No new pertinent imaging studies   Assessment/Plan:  66 y.o. male with CT findings consistent with early acute appendicitis we elected to proceed with medical management due to taking Plavix, and uncontrolled hypertension on admission.   Complicated by pertinent comorbidities including:  Patient Active Problem List   Diagnosis Date Noted   Acute appendicitis 09/25/2021   Needs flu shot 06/21/2020   Hospital discharge follow-up 01/20/2020   Dizziness 01/15/2020   Elevated troponin 01/15/2020   Cerebrovascular accident (CVA) ([place]) 01/15/2020   Encounter for general adult medical examination with abnormal findings 04/09/2018   Essential hypertension 11/27/2017   Mixed hyperlipidemia 11/27/2017   Peptic ulcer of stomach, chronic  11/27/2017   Low back pain without sciatica 11/27/2017   Accelerated hypertension 01/21/2017   Coronary artery disease due to lipid rich plaque 01/21/2017   GAD (generalized anxiety disorder) 01/21/2017   CKD (chronic kidney disease), stage III ([place]) 01/21/2017   Chest pain 11/25/2015     -May advance diet as tolerated.  -Continue IV antibiotics.  -Continue antihypertensives, holding Vasotec  due to elevated creatinine.  -Continue to hold Plavix for now, maintaining DVT prophylaxis.  All of the above findings and recommendations were discussed with the patient, and the medical team, and all of patient's questions were answered to his expressed satisfaction.   -- Ronny Bacon M.D., [facility] 09/26/2021 1:03 PM

## 2021-09-27 DIAGNOSIS — R799 Abnormal finding of blood chemistry, unspecified: Secondary | ICD-10-CM

## 2021-09-27 DIAGNOSIS — K3580 Unspecified acute appendicitis: Principal | ICD-10-CM

## 2021-09-27 LAB — BASIC METABOLIC PANEL
Anion gap: 5 (ref 5–15)
BUN: 30 mg/dL — ABNORMAL HIGH (ref 8–23)
CO2: 29 mmol/L (ref 22–32)
Calcium: 9.4 mg/dL (ref 8.9–10.3)
Chloride: 101 mmol/L (ref 98–111)
Creatinine, Ser: 1.79 mg/dL — ABNORMAL HIGH (ref 0.61–1.24)
GFR, Estimated: 41 mL/min — ABNORMAL LOW (ref >60)
Glucose, Bld: 116 mg/dL — ABNORMAL HIGH (ref 70–99)
Potassium: 4.1 mmol/L (ref 3.5–5.1)
Sodium: 135 mmol/L (ref 135–145)

## 2021-09-27 MED ORDER — METRONIDAZOLE 500 MG PO TABS
500.0000 mg | ORAL_TABLET | Freq: Two times a day (BID) | ORAL | Status: DC
  Administered 2021-09-27 – 2021-09-28 (×3): 500 mg via ORAL
  Filled 2021-09-27 (×4): qty 1

## 2021-09-27 MED ORDER — CIPROFLOXACIN HCL 500 MG PO TABS
500.0000 mg | ORAL_TABLET | Freq: Two times a day (BID) | ORAL | Status: DC
  Administered 2021-09-27 – 2021-09-28 (×3): 500 mg via ORAL
  Filled 2021-09-27 (×3): qty 1

## 2021-09-27 NOTE — Progress Notes (Signed)
[facility] SUR[facility]ICAL PROGRESS NOTE (cpt [phone])  Hospital Day(s): 2.   Post op day(s):  Marland Kitchen   Interval History: Patient seen and examined, no acute events or new complaints overnight. Patient reports feeling good, denies abdominal pain, fevers or chills, nausea or vomiting.  Appears to be tolerating diet fine.   We have withheld another of his antihypertensive medications due to his elevated BUN with persistently elevated creatinine.  Review of Systems:  Constitutional: denies fever, chills  HEENT: denies cough or congestion  Respiratory: denies any shortness of breath  Cardiovascular: denies chest pain or palpitations  Gastrointestinal: denies abdominal pain, N/V, or diarrhea/and bowel function as per interval history Genitourinary: denies burning with urination or urinary frequency Musculoskeletal: denies pain, decreased motor or sensation Integumentary: denies any other rashes or skin discolorations Neurological: denies HA or vision/hearing changes   Vital signs in last 24 hours: [min-max] current  Temp:  [97.7 F (36.5 C)-98.3 F (36.8 C)] 98.3 F (36.8 C) (01/29 0846) Pulse Rate:  [66-67] 66 (01/29 0846) Resp:  [17-18] 18 (01/29 0846) BP: (105-160)/(84-90) 129/90 (01/29 0846) SpO2:  [96 %-97 %] 96 % (01/29 0846)     Height: 5\' 9"  (175.3 cm) Weight: 99.8 kg BMI (Calculated): 32.47   Intake/Output last 2 shifts:  01/28 0701 - 01/29 0700 In: 1871.7 [P.O.:480; I.V.:791.7; IV Piggyback:600] Out: -    Physical Exam:  Constitutional: alert, cooperative and no distress  HENT: normocephalic without obvious abnormality  Respiratory: breathing non-labored at rest  Cardiovascular: regular rate and sinus rhythm  Gastrointestinal: soft, non-tender, and non-distended, no peritoneal signs. Musculoskeletal: UE and LE FROM, no edema or wounds, motor and sensation grossly intact, NT    Labs:  CBC Latest Ref Rng & Units 09/26/2021 09/25/2021 08/23/2020  WBC 4.0 - 10.5 K/uL  6.5 9.6 5.1  Hemoglobin 13.0 - 17.0 g/dL 14.4 17.8(H) 16.6  Hematocrit 39.0 - 52.0 % 44.3 52.9(H) 49.2  Platelets 150 - 400 K/uL 179 217 177   CMP Latest Ref Rng & Units 09/27/2021 09/26/2021 09/25/2021  Glucose 70 - 99 mg/dL 116(H) 116(H) 167(H)  BUN 8 - 23 mg/dL 30(H) 23 19  Creatinine 0.61 - 1.24 mg/dL 1.79(H) 1.79(H) 1.39(H)  Sodium 135 - 145 mmol/L 135 137 137  Potassium 3.5 - 5.1 mmol/L 4.1 4.3 3.8  Chloride 98 - 111 mmol/L 101 101 100  CO2 22 - 32 mmol/L 29 28 23   Calcium 8.9 - 10.3 mg/dL 9.4 9.4 10.7(H)  Total Protein 6.5 - 8.1 g/dL - 6.9 8.6(H)  Total Bilirubin 0.3 - 1.2 mg/dL - 0.8 1.2  Alkaline Phos 38 - 126 U/L - 38 50  AST 15 - 41 U/L - 18 24  ALT 0 - 44 U/L - 13 17     Imaging studies: No new pertinent imaging studies   Assessment/Plan:  66 y.o. male with CT findings consistent with early acute appendicitis we elected to proceed with medical management due to taking Plavix, and uncontrolled hypertension on admission.   Complicated by pertinent comorbidities including:  Patient Active Problem List   Diagnosis Date Noted   Acute appendicitis 09/25/2021   Needs flu shot 06/21/2020   Hospital discharge follow-up 01/20/2020   Dizziness 01/15/2020   Elevated troponin 01/15/2020   Cerebrovascular accident (CVA) ([place]) 01/15/2020   Encounter for general adult medical examination with abnormal findings 04/09/2018   Essential hypertension 11/27/2017   Mixed hyperlipidemia 11/27/2017   Peptic ulcer of stomach, chronic 11/27/2017   Low back pain without sciatica 11/27/2017  Accelerated hypertension 01/21/2017   Coronary artery disease due to lipid rich plaque 01/21/2017   GAD (generalized anxiety disorder) 01/21/2017   CKD (chronic kidney disease), stage III ([place]) 01/21/2017   Chest pain 11/25/2015     -Continue heart healthy diet..  -Continue p.o. antibiotics.  -Continue antihypertensives, holding Vasotec and [place] on due to elevated creatinine, now with adequate  blood pressure control. Will repeat BMP in a.m. May need further assistance with medical management pending results.  -Continue to hold Plavix for now, maintaining DVT prophylaxis.  All of the above findings and recommendations were discussed with the patient, and the medical team, and all of patient's questions were answered to his expressed satisfaction.   -- Ronny Bacon M.D., [facility] 09/27/2021 5:58 PM

## 2021-09-28 LAB — BASIC METABOLIC PANEL
Anion gap: 7 (ref 5–15)
BUN: 29 mg/dL — ABNORMAL HIGH (ref 8–23)
CO2: 27 mmol/L (ref 22–32)
Calcium: 9.2 mg/dL (ref 8.9–10.3)
Chloride: 101 mmol/L (ref 98–111)
Creatinine, Ser: 1.55 mg/dL — ABNORMAL HIGH (ref 0.61–1.24)
GFR, Estimated: 49 mL/min — ABNORMAL LOW (ref >60)
Glucose, Bld: 124 mg/dL — ABNORMAL HIGH (ref 70–99)
Potassium: 3.9 mmol/L (ref 3.5–5.1)
Sodium: 135 mmol/L (ref 135–145)

## 2021-09-28 MED ORDER — CIPROFLOXACIN HCL 500 MG PO TABS: 500.0000 mg | ORAL_TABLET | Freq: Two times a day (BID) | ORAL | 0 refills | Status: AC

## 2021-09-28 MED ORDER — METRONIDAZOLE 500 MG PO TABS: 500.0000 mg | ORAL_TABLET | Freq: Two times a day (BID) | ORAL | 0 refills | Status: AC

## 2021-09-28 NOTE — Discharge Summary (Signed)
[facility] SURGICAL DISCHARGE SUMMARY (cpt: [phone])  Patient ID: Howard Weaver MRN: [id number] DOB/AGE: July 09, 1956 66 y.o.  Admit date: 09/25/2021 Discharge date: 09/28/2021  Discharge Diagnoses Patient Active Problem List   Diagnosis Date Noted   Acute appendicitis 09/25/2021    Consultants None  Procedures None  HPI: 66 y.o. male presented to [facility] ED today for abdominal pain. Patient reports around a 48 hour history of RLQ abdominal pain with associated decreased appetite, nausea, and emesis in the ED. The pain has been constant and radiates to his back. He described this as sharp and stabbing in nature. Nothing seemed to give him any relief. He denied any associated fever, chills, SOB, CP, urinary changes, or bowel changes. No history of similar in the past. He does report a significant abdominal surgical history including open gastric ulcer repair. Additionally of note, he is also on Plavix secondary to what he reports as "stents in his neck." Upon chart review, he does follow with Dr Theora Master [facility]), and he is on ASA and Plavix for CVA prevention. Additional pertinent medical history includes CAD, MI, HTN, and CKD. Work up in the ED revealed a normal WBC at 9.6K, he is hemo-concentrated with Hgb 17.8, renal function seems to be at baseline with sCr - 1.39. CT Abdomen/pelvis was concerning for acute uncomplicated appendicitis.   Hospital Course: Patient was admitted for conservative management given anticoagulation with Plavix. He did well. Patient's pain/symptoms improved/resolved and advancement of patient's diet and ambulation were well-tolerated. The remainder of patient's hospital course was essentially unremarkable, and discharge planning was initiated accordingly with patient safely able to be discharged home with appropriate discharge instructions, antibiotics (Cipro + Flagyl x7 days to complete 10 days total), pain control, and outpatient  follow-up after all of his questions were answered to his expressed satisfaction.   Discharge Condition: Good   Physical Examination:  Constitutional: Well appearing male, NAD Pulmonary: Normal effort, no respiratory distress Gastrointestinal: Soft, non-tender, non-distended, no rebound/guarding. No evidence of peritonitis  Skin: warm, dry    Allergies as of 09/28/2021       Reactions   Penicillins Anaphylaxis   Has patient had a PCN reaction causing immediate rash, facial/tongue/throat swelling, SOB or lightheadedness with hypotension: Yes Has patient had a PCN reaction causing severe rash involving mucus membranes or skin necrosis: Yes Has patient had a PCN reaction that required hospitalization Yes Has patient had a PCN reaction occurring within the last 10 years: No If all of the above answers are "NO", then may proceed with Cephalosporin use. Other reaction(s): Other (See Comments) Other reaction(s): Unknown Has patient had a PCN reaction causing immediate rash, facial/tongue/throat swelling, SOB or lightheadedness with hypotension: Yes Has patient had a PCN reaction causing severe rash involving mucus membranes or skin necrosis: Yes Has patient had a PCN reaction that required hospitalization Yes Has patient had a PCN reaction occurring within the last 10 years: No If all of the above answers are "NO", then may proceed with Cephalosporin use.   Ibuprofen Other (See Comments)   Gi upset Other reaction(s): Other (See Comments) Other reaction(s): Unknown Gi upset        Medication List     TAKE these medications    aspirin 81 MG EC tablet Take 1 tablet (81 mg total) by mouth daily.   busPIRone 15 MG tablet Commonly known as: BUSPAR Take 15 mg twice a day   chlorthalidone 25 MG tablet Commonly known as: HYGROTON Take 1 tablet (  25 mg total) by mouth daily.   ciprofloxacin 500 MG tablet Commonly known as: CIPRO Take 1 tablet (500 mg total) by mouth 2 (two) times  daily for 7 days.   clonazePAM 0.5 MG tablet Commonly known as: KLONOPIN Take(0.25 mg ) 1/2  tablet daily.   cloNIDine 0.3 MG tablet Commonly known as: CATAPRES Take 1 tablet (0.3 mg total) by mouth 2 (two) times daily.   clopidogrel 75 MG tablet Commonly known as: PLAVIX Take 1 tablet (75 mg total) by mouth daily.   COVID-19 mRNA Vac-TriS (Pfizer) Susp injection Inject 0.3 mLs into the muscle once for 1 dose.   enalapril 20 MG tablet Commonly known as: VASOTEC Take 2 tablets (40 mg total) by mouth daily.   escitalopram 20 MG tablet Commonly known as: LEXAPRO Take 1 tablet (20 mg total) by mouth daily.   isosorbide mononitrate 30 MG 24 hr tablet Commonly known as: IMDUR Take 1 tablet (30 mg total) by mouth daily.   metoprolol succinate 100 MG 24 hr tablet Commonly known as: TOPROL-XL Take 1 tablet (100 mg total) by mouth daily. Take with or immediately following a meal.   metroNIDAZOLE 500 MG tablet Commonly known as: FLAGYL Take 1 tablet (500 mg total) by mouth every 12 (twelve) hours for 7 days.   ondansetron 4 MG disintegrating tablet Commonly known as: Zofran ODT Allow 1-2 tablets to dissolve in your mouth every 8 hours as needed for nausea/vomiting   pantoprazole 40 MG tablet Commonly known as: PROTONIX Take 1 tablet (40 mg total) by mouth daily.   Prevnar 20 0.5 ML injection Generic drug: pneumococcal 20-valent conjugate vaccine   rosuvastatin 40 MG tablet Commonly known as: CRESTOR Take 1 tablet (40 mg total) by mouth daily.          Follow-up Information     [name], MD. Schedule an appointment as soon as possible for a visit in 3 week(s).   Specialty: General Surgery Why: hospital follow up; appendicitis; surgical planning Contact information: [address] [place] [address] [phone]         Morene Crocker, MD. Schedule an appointment as soon as possible for a visit in 2 week(s).   Specialty:  Neurology Why: Hospital follow up; appendicitis, on Plavix for stroke prevention, ensure safe to come off Plavix for surgery Contact information: [address] [facility] [place] [address] [phone]         Laurier Nancy, MD. Schedule an appointment as soon as possible for a visit in 2 week(s).   Specialty: Cardiology Why: Hospital follow up; appendicitis. Follow up for BP medications and renal function, also will need surgical clearance for interval appendectomy Contact information: [address] [place] [address] [phone]                  Time spent on discharge management including discussion of hospital course, clinical condition, outpatient instructions, prescriptions, and follow up with the patient and members of the medical team: >30 minutes  -- Lynden Oxford , PA-C [facility]  09/28/2021, 9:48 AM [phone] M-F: 7am - 4pm

## 2021-09-28 NOTE — TOC CM/SW Note (Signed)
Patient has orders to discharge home today. Chart reviewed. PCP is Sallyanne Kuster, NP. On room air. No wounds. No TOC needs identified. CSW signing off.  Charlynn Court, CSW [phone]

## 2021-09-28 NOTE — Progress Notes (Signed)
Howard Weaver to be D/C'd Home per MD order.  Discussed prescriptions and follow up appointments with the patient. Prescriptions given to patient, medication list explained in detail. Pt verbalized understanding.  Allergies as of 09/28/2021       Reactions   Penicillins Anaphylaxis   Has patient had a PCN reaction causing immediate rash, facial/tongue/throat swelling, SOB or lightheadedness with hypotension: Yes Has patient had a PCN reaction causing severe rash involving mucus membranes or skin necrosis: Yes Has patient had a PCN reaction that required hospitalization Yes Has patient had a PCN reaction occurring within the last 10 years: No If all of the above answers are "NO", then may proceed with Cephalosporin use. Other reaction(s): Other (See Comments) Other reaction(s): Unknown Has patient had a PCN reaction causing immediate rash, facial/tongue/throat swelling, SOB or lightheadedness with hypotension: Yes Has patient had a PCN reaction causing severe rash involving mucus membranes or skin necrosis: Yes Has patient had a PCN reaction that required hospitalization Yes Has patient had a PCN reaction occurring within the last 10 years: No If all of the above answers are "NO", then may proceed with Cephalosporin use.   Ibuprofen Other (See Comments)   Gi upset Other reaction(s): Other (See Comments) Other reaction(s): Unknown Gi upset        Medication List     TAKE these medications    aspirin 81 MG EC tablet Take 1 tablet (81 mg total) by mouth daily.   busPIRone 15 MG tablet Commonly known as: BUSPAR Take 15 mg twice a day   chlorthalidone 25 MG tablet Commonly known as: HYGROTON Take 1 tablet (25 mg total) by mouth daily.   ciprofloxacin 500 MG tablet Commonly known as: CIPRO Take 1 tablet (500 mg total) by mouth 2 (two) times daily for 7 days.   clonazePAM 0.5 MG tablet Commonly known as: KLONOPIN Take(0.25 mg ) 1/2  tablet daily.   cloNIDine 0.3 MG  tablet Commonly known as: CATAPRES Take 1 tablet (0.3 mg total) by mouth 2 (two) times daily.   clopidogrel 75 MG tablet Commonly known as: PLAVIX Take 1 tablet (75 mg total) by mouth daily.   COVID-19 mRNA Vac-TriS (Pfizer) Susp injection Inject 0.3 mLs into the muscle once for 1 dose.   enalapril 20 MG tablet Commonly known as: VASOTEC Take 2 tablets (40 mg total) by mouth daily.   escitalopram 20 MG tablet Commonly known as: LEXAPRO Take 1 tablet (20 mg total) by mouth daily.   isosorbide mononitrate 30 MG 24 hr tablet Commonly known as: IMDUR Take 1 tablet (30 mg total) by mouth daily.   metoprolol succinate 100 MG 24 hr tablet Commonly known as: TOPROL-XL Take 1 tablet (100 mg total) by mouth daily. Take with or immediately following a meal.   metroNIDAZOLE 500 MG tablet Commonly known as: FLAGYL Take 1 tablet (500 mg total) by mouth every 12 (twelve) hours for 7 days.   ondansetron 4 MG disintegrating tablet Commonly known as: Zofran ODT Allow 1-2 tablets to dissolve in your mouth every 8 hours as needed for nausea/vomiting   pantoprazole 40 MG tablet Commonly known as: PROTONIX Take 1 tablet (40 mg total) by mouth daily.   Prevnar 20 0.5 ML injection Generic drug: pneumococcal 20-valent conjugate vaccine   rosuvastatin 40 MG tablet Commonly known as: CRESTOR Take 1 tablet (40 mg total) by mouth daily.        Vitals:   09/28/21 0533 09/28/21 0837  BP: 122/85 (!) 143/100  Pulse: 60 (!) 56  Resp: 18 18  Temp: 97.9 F (36.6 C)   SpO2: 94% 99%    Skin clean, dry and intact without evidence of skin break down, no evidence of skin tears noted. IV catheter discontinued intact. Site without signs and symptoms of complications. Dressing and pressure applied. Pt denies pain at this time. No complaints noted.  An After Visit Summary was printed and given to the patient. Patient escorted via WC, and D/C home via private auto.  Howard Weaver C. Jilda Roche

## 2021-09-28 NOTE — Care Management Important Message (Signed)
Important Message  Patient Details  Name: Howard Weaver MRN: [id number] Date of Birth: 09-25-1955   Medicare Important Message Given:  N/A - LOS <3 / Initial given by admissions     Johnell Comings 09/28/2021, 12:11 PM

## 2021-10-07 ENCOUNTER — Other Ambulatory Visit: Payer: Self-pay

## 2021-10-07 ENCOUNTER — Ambulatory Visit (INDEPENDENT_AMBULATORY_CARE_PROVIDER_SITE_OTHER): Payer: Medicare Other | Admitting: Nurse Practitioner

## 2021-10-07 ENCOUNTER — Encounter: Payer: Self-pay | Admitting: Nurse Practitioner

## 2021-10-07 VITALS — BP 114/76 | HR 62 | Temp 98.2°F | Resp 16 | Ht 68.0 in | Wt 221.6 lb

## 2021-10-07 DIAGNOSIS — Z7901 Long term (current) use of anticoagulants: Secondary | ICD-10-CM

## 2021-10-07 DIAGNOSIS — Z09 Encounter for follow-up examination after completed treatment for conditions other than malignant neoplasm: Secondary | ICD-10-CM

## 2021-10-07 DIAGNOSIS — K3589 Other acute appendicitis without perforation or gangrene: Secondary | ICD-10-CM

## 2021-10-07 NOTE — Progress Notes (Signed)
[facility] [address] [place] [address] [phone]                                   Transitional Care Clinic   [facility] Discharge Acute Issues Care Follow Up                                                                        Patient Demographics  Howard Weaver, is a 66 y.o. male  DOB 06/28/1956  MRN [id number].  Primary MD  Sallyanne Kuster, NP   Reason for TCC follow Up - appendicitis   Past Medical History:  Diagnosis Date   Appendicitis 09/24/2021   CAD (coronary artery disease)    Chest pain    DU (duodenal ulcer)    GAD (generalized anxiety disorder)    Hypertension    Myocardial infarction [facility])    Radiculopathy of thoracolumbar region    SVT (supraventricular tachycardia) (HCC)    Tachycardia     Past Surgical History:  Procedure Laterality Date   BRAIN SURGERY     CARDIAC CATHETERIZATION Right 11/25/2015   Procedure: Left Heart Cath and Coronary Angiography;  Surgeon: Laurier Nancy, MD;  Location: [facility] INVASIVE CV LAB;  Service: Cardiovascular;  Laterality: Right;   TEE WITHOUT CARDIOVERSION Right 01/17/2020   Procedure: TRANSESOPHAGEAL ECHOCARDIOGRAM (TEE);  Surgeon: Laurier Nancy, MD;  Location: [facility] ORS;  Service: Cardiovascular;  Laterality: Right;       Recent HPI and Hospital Course  HPI: 66 y.o. male presented to [facility] ED today for abdominal pain. Patient reports around a 48 hour history of RLQ abdominal pain with associated decreased appetite, nausea, and emesis in the ED. The pain has been constant and radiates to his back. He described this as sharp and stabbing in nature. Nothing seemed to give him any relief. He denied any associated fever, chills, SOB, CP, urinary changes, or bowel changes. No history of similar in the past. He does report a significant abdominal surgical history including open gastric ulcer repair. Additionally of note, he is also on Plavix secondary to what he  reports as "stents in his neck." Upon chart review, he does follow with Dr Theora Master [facility]), and he is on ASA and Plavix for CVA prevention. Additional pertinent medical history includes CAD, MI, HTN, and CKD. Work up in the ED revealed a normal WBC at 9.6K, he is hemo-concentrated with Hgb 17.8, renal function seems to be at baseline with sCr - 1.39. CT Abdomen/pelvis was concerning for acute uncomplicated appendicitis.    Hospital Course: Patient was admitted for conservative management given anticoagulation with Plavix. He did well. Patient's pain/symptoms improved/resolved and advancement of patient's diet and ambulation were well-tolerated. The remainder of patient's hospital course was essentially unremarkable, and discharge planning was initiated accordingly with patient safely able to be discharged home with appropriate discharge instructions, antibiotics (Cipro + Flagyl x7 days to complete 10 days total), pain control, and outpatient follow-up after all of his questions were answered to his expressed satisfaction.  Post Hospital Acute Care Issue to be followed in the Clinic   Appendicitis Hypertension Complications of  appendicitis.    Subjective:   Howard Weaver today has, No headache, No chest pain, No abdominal pain - No Nausea, No new weakness tingling or numbness, No Cough - SOB. Mild to moderate abdominal pain. Patient will have surgery for appendectomy but he is on blood thinners and has to be off of them for a certain number of days prior to his surgery.   Assessment & Plan   1. Hospital discharge follow-up Patient is doing well since hospital discharge. He was diagnosed with appendicitis which was medically managed. Due to the patient being on a blood thinner, surgery was not an immediate option.   2. Other acute appendicitis Medically managed during hospital stay. He will be following up with general surgery to get his appendectomy scheduled. He will come  off the anticoagulant for the surgery and then restart the anticoagulant after his surgery per the surgeon's instructions.   3. Long term (current) use of anticoagulants Patient has a history of stroke, Taking plavix and aspirin for prevention of a subsequent stroke. will follow the surgeon's instructions on when to stop the medication prior to surgery and when to restart the medication after surgery.      Reason for frequent admissions/ER visits   Appendicitis, hypertension, prior stroke, increased risk of subsequent stroke, chest pain, CKD, peptic ulcer, CAD      Objective:   Vitals:   10/07/21 1111  BP: 114/76  Pulse: 62  Resp: 16  Temp: 98.2 F (36.8 C)  SpO2: 99%  Weight: 221 lb 9.6 oz (100.5 kg)  Height: 5\' 8"  (1.727 m)    Wt Readings from Last 3 Encounters:  10/07/21 221 lb 9.6 oz (100.5 kg)  09/25/21 220 lb (99.8 kg)  08/28/21 223 lb 9.6 oz (101.4 kg)    Allergies as of 10/07/2021       Reactions   Penicillins Anaphylaxis   Has patient had a PCN reaction causing immediate rash, facial/tongue/throat swelling, SOB or lightheadedness with hypotension: Yes Has patient had a PCN reaction causing severe rash involving mucus membranes or skin necrosis: Yes Has patient had a PCN reaction that required hospitalization Yes Has patient had a PCN reaction occurring within the last 10 years: No If all of the above answers are "NO", then may proceed with Cephalosporin use. Other reaction(s): Other (See Comments) Other reaction(s): Unknown Has patient had a PCN reaction causing immediate rash, facial/tongue/throat swelling, SOB or lightheadedness with hypotension: Yes Has patient had a PCN reaction causing severe rash involving mucus membranes or skin necrosis: Yes Has patient had a PCN reaction that required hospitalization Yes Has patient had a PCN reaction occurring within the last 10 years: No If all of the above answers are "NO", then may proceed with Cephalosporin use.    Ibuprofen Other (See Comments)   Gi upset Other reaction(s): Other (See Comments) Other reaction(s): Unknown Gi upset        Medication List        Accurate as of October 07, 2021 11:59 PM. If you have any questions, ask your nurse or doctor.          aspirin 81 MG EC tablet Take 1 tablet (81 mg total) by mouth daily.   busPIRone 15 MG tablet Commonly known as: BUSPAR Take 15 mg twice a day   chlorthalidone 25 MG tablet Commonly known as: HYGROTON Take 1 tablet (25 mg total) by mouth daily.   clonazePAM 0.5 MG tablet Commonly known as: KLONOPIN Take(0.25 mg ) 1/2  tablet daily.   cloNIDine 0.3 MG tablet Commonly known as: CATAPRES Take 1 tablet (0.3 mg total) by mouth 2 (two) times daily.   clopidogrel 75 MG tablet Commonly known as: PLAVIX Take 1 tablet (75 mg total) by mouth daily.   COVID-19 mRNA Vac-TriS (Pfizer) Susp injection Inject 0.3 mLs into the muscle once for 1 dose.   enalapril 20 MG tablet Commonly known as: VASOTEC Take 2 tablets (40 mg total) by mouth daily.   escitalopram 20 MG tablet Commonly known as: LEXAPRO Take 1 tablet (20 mg total) by mouth daily.   isosorbide mononitrate 30 MG 24 hr tablet Commonly known as: IMDUR Take 1 tablet (30 mg total) by mouth daily.   metoprolol succinate 100 MG 24 hr tablet Commonly known as: TOPROL-XL Take 1 tablet (100 mg total) by mouth daily. Take with or immediately following a meal.   ondansetron 4 MG disintegrating tablet Commonly known as: Zofran ODT Allow 1-2 tablets to dissolve in your mouth every 8 hours as needed for nausea/vomiting   pantoprazole 40 MG tablet Commonly known as: PROTONIX Take 1 tablet (40 mg total) by mouth daily.   Prevnar 20 0.5 ML injection Generic drug: pneumococcal 20-valent conjugate vaccine   rosuvastatin 40 MG tablet Commonly known as: CRESTOR Take 1 tablet (40 mg total) by mouth daily.         Physical Exam: Constitutional: Patient appears  well-developed and well-nourished. Not in obvious distress. HENT: Normocephalic, atraumatic, External right and left ear normal. Oropharynx is clear and moist.  Eyes: Conjunctivae and EOM are normal. PERRLA, no scleral icterus. Neck: Normal ROM. Neck supple. No JVD. No tracheal deviation. No thyromegaly. CVS: RRR, S1/S2 +, no murmurs, no gallops, no carotid bruit.  Pulmonary: Effort and breath sounds normal, no stridor, rhonchi, wheezes, rales.  Abdominal: Soft. BS +, no distension, tenderness, rebound or guarding.  Musculoskeletal: Normal range of motion. No edema and no tenderness.  Lymphadenopathy: No lymphadenopathy noted, cervical, inguinal or axillary Neuro: Alert. Normal reflexes, muscle tone coordination. No cranial nerve deficit. Skin: Skin is warm and dry. No rash noted. Not diaphoretic. No erythema. No pallor. Psychiatric: Normal mood and affect. Behavior, judgment, thought content normal.   Data Review   Micro Results No results found for this or any previous visit (from the past 240 hour(s)).   CBC No results for input(s): WBC, HGB, HCT, PLT, MCV, MCH, MCHC, RDW, LYMPHSABS, MONOABS, EOSABS, BASOSABS, BANDABS in the last 168 hours.  Invalid input(s): NEUTRABS, BANDSABD  Chemistries  No results for input(s): NA, K, CL, CO2, GLUCOSE, BUN, CREATININE, CALCIUM, MG, AST, ALT, ALKPHOS, BILITOT in the last 168 hours.  Invalid input(s): GFRCGP ------------------------------------------------------------------------------------------------------------------ CrCl cannot be calculated (Patient's most recent lab result is older than the maximum 21 days allowed.). ------------------------------------------------------------------------------------------------------------------ No results for input(s): HGBA1C in the last 72 hours. ------------------------------------------------------------------------------------------------------------------ No results for input(s): CHOL, HDL,  LDLCALC, TRIG, CHOLHDL, LDLDIRECT in the last 72 hours. ------------------------------------------------------------------------------------------------------------------ No results for input(s): TSH, T4TOTAL, T3FREE, THYROIDAB in the last 72 hours.  Invalid input(s): FREET3 ------------------------------------------------------------------------------------------------------------------ No results for input(s): VITAMINB12, FOLATE, FERRITIN, TIBC, IRON, RETICCTPCT in the last 72 hours.  Coagulation profile No results for input(s): INR, PROTIME in the last 168 hours.  No results for input(s): DDIMER in the last 72 hours.  Cardiac Enzymes No results for input(s): CKMB, TROPONINI, MYOGLOBIN in the last 168 hours.  Invalid input(s): CK ------------------------------------------------------------------------------------------------------------------ Invalid input(s): POCBNP  No follow-ups on file.  Time Spent in minutes  45 Time spent with  patient included reviewing progress notes, labs, imaging studies, and discussing plan for follow up.   This patient was seen by Sallyanne Kuster, FNP-C in collaboration with Dr. Beverely Risen as a part of collaborative care agreement.   Sallyanne Kuster MSN, FNP-C on 10/25/2021 at 10:00 PM   **Disclaimer: This note may have been dictated with voice recognition software. Similar sounding words can inadvertently be transcribed and this note may contain transcription errors which may not have been corrected upon publication of note.**

## 2021-10-20 ENCOUNTER — Inpatient Hospital Stay: Payer: Medicare Other | Admitting: Surgery

## 2021-10-25 ENCOUNTER — Encounter: Payer: Self-pay | Admitting: Nurse Practitioner

## 2021-10-27 ENCOUNTER — Ambulatory Visit (INDEPENDENT_AMBULATORY_CARE_PROVIDER_SITE_OTHER): Payer: Medicare Other | Admitting: Surgery

## 2021-10-27 ENCOUNTER — Encounter: Payer: Self-pay | Admitting: Surgery

## 2021-10-27 ENCOUNTER — Other Ambulatory Visit: Payer: Self-pay

## 2021-10-27 ENCOUNTER — Telehealth: Payer: Self-pay

## 2021-10-27 VITALS — BP 168/125 | HR 95 | Temp 98.0°F | Ht 69.0 in | Wt 221.4 lb

## 2021-10-27 DIAGNOSIS — K3580 Unspecified acute appendicitis: Secondary | ICD-10-CM | POA: Diagnosis not present

## 2021-10-27 DIAGNOSIS — K3589 Other acute appendicitis without perforation or gangrene: Secondary | ICD-10-CM

## 2021-10-27 NOTE — Patient Instructions (Signed)
Our surgery scheduler will call you within 24-48 hours to schedule your surgery. Please have the Blue surgery sheet available when speaking with her.     Laparoscopic Appendectomy, Adult, Care After This sheet gives you information about how to care for yourself after your procedure. Your health care provider may also give you more specific instructions. If you have problems or questions, contact your health care provider. What can I expect after the procedure? After the procedure, it is common to have: Little energy for normal activities. Mild pain in the area where the incisions were made. Difficulty passing stool (constipation). This can be caused by: Pain medicine. A decrease in your activity. Follow these instructions at home: Medicines Take over-the-counter and prescription medicines only as told by your health care provider. If you were prescribed an antibiotic medicine, take it as told by your health care provider. Do not stop taking the antibiotic even if you start to feel better. Do not drive or use heavy machinery while taking prescription pain medicine. Ask your health care provider if the medicine prescribed to you can cause constipation. You may need to take steps to prevent or treat constipation, such as: Drink enough fluid to keep your urine pale yellow. Take over-the-counter or prescription medicines. Eat foods that are high in fiber, such as beans, whole grains, and fresh fruits and vegetables. Limit foods that are high in fat and processed sugars, such as fried or sweet foods. Incision care  Follow instructions from your health care provider about how to take care of your incisions. Make sure you: Wash your hands with soap and water before and after you change your bandage (dressing). If soap and water are not available, use hand sanitizer. Change your dressing as told by your health care provider. Leave stitches (sutures), skin glue, or adhesive strips in place. These  skin closures may need to stay in place for 2 weeks or longer. If adhesive strip edges start to loosen and curl up, you may trim the loose edges. Do not remove adhesive strips completely unless your health care provider tells you to do that. Check your incision areas every day for signs of infection. Check for: Redness, swelling, or pain. Fluid or blood. Warmth. Pus or a bad smell. Bathing Keep your incisions clean and dry. Clean them as often as told by your health care provider. To do this: Gently wash the incisions with soap and water. Rinse the incisions with water to remove all soap. Pat the incisions dry with a clean towel. Do not rub the incisions. Do not take baths, swim, or use a hot tub for 2 weeks, or until your health care provider approves. You may take showers after 48 hours. Activity  Do not drive for 24 hours if you were given a sedative during your procedure. Rest after the procedure. Return to your normal activities as told by your health care provider. Ask your health care provider what activities are safe for you. For 3 weeks, or for as long as told by your health care provider: Do not lift anything that is heavier than 10 lb (4.5 kg), or the limit that you are told. Do not play contact sports. General instructions If you were sent home with a drain, follow instructions from your health care provider about how to care for it. Take deep breaths. This helps to prevent your lungs from developing an infection (pneumonia). Keep all follow-up visits as told by your health care provider. This is important. Contact a  health care provider if: You have redness, swelling, or pain around an incision. You have fluid or blood coming from an incision. Your incision feels warm to the touch. You have pus or a bad smell coming from an incision or dressing. Your incision edges break open after your sutures have been removed. You have increasing pain in your shoulders. You feel dizzy or  you faint. You develop shortness of breath. You keep feeling nauseous or you are vomiting. You have diarrhea or you cannot control your bowel functions. You lose your appetite. You develop swelling or pain in your legs. You develop a rash. Get help right away if you have: A fever. Difficulty breathing. Sharp pains in your chest. Summary After a laparoscopic appendectomy, it is common to have little energy for normal activities, mild pain in the area of the incisions, and constipation. Infection is the most common complication after this procedure. Follow your health care provider's instructions about caring for yourself after the procedure. Rest after the procedure. Return to your normal activities as told by your health care provider. Contact your health care provider if you notice signs of infection around your incisions or you develop shortness of breath. Get help right away if you have a fever, chest pain, or difficulty breathing. This information is not intended to replace advice given to you by your health care provider. Make sure you discuss any questions you have with your health care provider. Document Revised: 02/16/2018 Document Reviewed: 02/16/2018 Elsevier Patient Education  2022 [employer].

## 2021-10-27 NOTE — Telephone Encounter (Signed)
Medical Clearance faxed to [facility] at this time. Patient currently taking Plavix.

## 2021-10-27 NOTE — Progress Notes (Signed)
Patient ID: Howard Weaver, male   DOB: 03/26/1956, 65 y.o.   MRN: [id number]  Chief Complaint: Follow-up for appendicitis.  History of Present Illness Howard Weaver is a 66 y.o. male with an admitting diagnosis of acute appendicitis in January.  Due to his Plavix intake, we felt prudent to proceed with utilization of antibiotic treatment which she responded to quite well.  However he would like to proceed with interval/definitive appendectomy.  Apparently he had an aneurysm sometime back and significant time hospitalized.  Uncertain exactly to what degree continuing the Plavix is at this point.  We felt prudent not to interrupt it during his last admission, and he chose to utilize an antibiotic treatment regimen any follow through with this.  He otherwise appears to be doing well but feels he still has some lingering abdominal discomfort and blames it on his appendix.  Denies any fevers or chills, nausea or vomiting.  Past Medical History Past Medical History:  Diagnosis Date   Appendicitis 09/24/2021   CAD (coronary artery disease)    Chest pain    DU (duodenal ulcer)    GAD (generalized anxiety disorder)    Hypertension    Myocardial infarction [facility])    Radiculopathy of thoracolumbar region    SVT (supraventricular tachycardia) (HCC)    Tachycardia       Past Surgical History:  Procedure Laterality Date   BRAIN SURGERY     CARDIAC CATHETERIZATION Right 11/25/2015   Procedure: Left Heart Cath and Coronary Angiography;  Surgeon: Dionisio David, MD;  Location: [place] [facility];  Service: Cardiovascular;  Laterality: Right;   TEE WITHOUT CARDIOVERSION Right 01/17/2020   Procedure: TRANSESOPHAGEAL ECHOCARDIOGRAM (TEE);  Surgeon: Dionisio David, MD;  Location: [facility] ORS;  Service: Cardiovascular;  Laterality: Right;    Allergies  Allergen Reactions   Penicillins Anaphylaxis    Has patient had a PCN reaction causing immediate rash, facial/tongue/throat swelling, SOB or  lightheadedness with hypotension: Yes Has patient had a PCN reaction causing severe rash involving mucus membranes or skin necrosis: Yes Has patient had a PCN reaction that required hospitalization Yes Has patient had a PCN reaction occurring within the last 10 years: No If all of the above answers are "NO", then may proceed with Cephalosporin use.  Other reaction(s): Other (See Comments) Other reaction(s): Unknown Has patient had a PCN reaction causing immediate rash, facial/tongue/throat swelling, SOB or lightheadedness with hypotension: Yes Has patient had a PCN reaction causing severe rash involving mucus membranes or skin necrosis: Yes Has patient had a PCN reaction that required hospitalization Yes Has patient had a PCN reaction occurring within the last 10 years: No If all of the above answers are "NO", then may proceed with Cephalosporin use.   Ibuprofen Other (See Comments)    Gi upset Other reaction(s): Other (See Comments) Other reaction(s): Unknown Gi upset    Current Outpatient Medications  Medication Sig Dispense Refill   aspirin 81 MG EC tablet Take 1 tablet (81 mg total) by mouth daily. 90 tablet 1   busPIRone (BUSPAR) 15 MG tablet Take 15 mg twice a day 180 tablet 5   chlorthalidone (HYGROTON) 25 MG tablet Take 1 tablet (25 mg total) by mouth daily. 90 tablet 1   clonazePAM (KLONOPIN) 0.5 MG tablet Take(0.25 mg ) 1/2  tablet daily. 90 tablet 0   cloNIDine (CATAPRES) 0.3 MG tablet Take 1 tablet (0.3 mg total) by mouth 2 (two) times daily. 180 tablet 1   clopidogrel (PLAVIX) 75  MG tablet Take 1 tablet (75 mg total) by mouth daily. 90 tablet 1   enalapril (VASOTEC) 20 MG tablet Take 2 tablets (40 mg total) by mouth daily. 180 tablet 1   escitalopram (LEXAPRO) 20 MG tablet Take 1 tablet (20 mg total) by mouth daily. 90 tablet 1   isosorbide mononitrate (IMDUR) 30 MG 24 hr tablet Take 1 tablet (30 mg total) by mouth daily. 90 tablet 1   metoprolol succinate (TOPROL-XL) 100  MG 24 hr tablet Take 1 tablet (100 mg total) by mouth daily. Take with or immediately following a meal. 90 tablet 1   ondansetron (ZOFRAN ODT) 4 MG disintegrating tablet Allow 1-2 tablets to dissolve in your mouth every 8 hours as needed for nausea/vomiting 180 tablet 0   pantoprazole (PROTONIX) 40 MG tablet Take 1 tablet (40 mg total) by mouth daily. 90 tablet 1   PFIZER COVID-19 VAC BIVALENT injection      PREVNAR 20 0.5 ML SUSY      rosuvastatin (CRESTOR) 40 MG tablet Take 1 tablet (40 mg total) by mouth daily. 90 tablet 1   COVID-19 mRNA Vac-TriS, Pfizer, SUSP injection Inject 0.3 mLs into the muscle once for 1 dose. 0.3 mL 0   No current facility-administered medications for this visit.    Family History Family History  Problem Relation Age of Onset   Hypertension Mother    Diabetes Mother    Depression Mother    Hypertension Father       Social History Social History   Tobacco Use   Smoking status: Former    Types: Cigarettes    Quit date: 11/25/1975    Years since quitting: 45.9   Smokeless tobacco: Never  Vaping Use   Vaping Use: Never used  Substance Use Topics   Alcohol use: No   Drug use: No       Review of Systems  Constitutional: Negative.   HENT: Negative.    Eyes: Negative.   Respiratory: Negative.    Cardiovascular: Negative.   Gastrointestinal:  Positive for abdominal pain.  Genitourinary: Negative.   Skin: Negative.   Neurological: Negative.   Psychiatric/Behavioral: Negative.       Physical Exam Blood pressure (!) 168/125, pulse 95, temperature 98 F (36.7 C), temperature source Oral, height 5\' 9"  (1.753 m), weight 221 lb 6.4 oz (100.4 kg), SpO2 98 %. Last Weight  Most recent update: 10/27/2021  3:10 PM    Weight  100.4 kg (221 lb 6.4 oz)             CONSTITUTIONAL: Well developed, and nourished, appropriately responsive and aware without distress.   EYES: Sclera non-icteric.   EARS, NOSE, MOUTH AND THROAT: Mask worn.   Hearing is  intact to voice.  NECK: Trachea is midline, and there is no jugular venous distension.  LYMPH NODES:  Lymph nodes in the neck are not enlarged. RESPIRATORY:  Lungs are clear, and breath sounds are equal bilaterally. Normal respiratory effort without pathologic use of accessory muscles. CARDIOVASCULAR: Heart is regular in rate and rhythm. GI: The abdomen is well rounded, soft, nontender, and nondistended. There were no palpable masses. I did not appreciate hepatosplenomegaly. There were normal bowel sounds. MUSCULOSKELETAL:  Symmetrical muscle tone appreciated in all four extremities.    SKIN: Skin turgor is normal. No pathologic skin lesions appreciated.  NEUROLOGIC:  Motor and sensation appear grossly normal.  Cranial nerves are grossly without defect. PSYCH:  Alert and oriented to person, place and time. Affect is  appropriate for situation.  Data Reviewed I have personally reviewed what is currently available of the patient's imaging, recent labs and medical records.   Labs:  CBC Latest Ref Rng & Units 09/26/2021 09/25/2021 08/23/2020  WBC 4.0 - 10.5 K/uL 6.5 9.6 5.1  Hemoglobin 13.0 - 17.0 g/dL 14.4 17.8(H) 16.6  Hematocrit 39.0 - 52.0 % 44.3 52.9(H) 49.2  Platelets 150 - 400 K/uL 179 217 177   CMP Latest Ref Rng & Units 09/28/2021 09/27/2021 09/26/2021  Glucose 70 - 99 mg/dL 124(H) 116(H) 116(H)  BUN 8 - 23 mg/dL 29(H) 30(H) 23  Creatinine 0.61 - 1.24 mg/dL 1.55(H) 1.79(H) 1.79(H)  Sodium 135 - 145 mmol/L 135 135 137  Potassium 3.5 - 5.1 mmol/L 3.9 4.1 4.3  Chloride 98 - 111 mmol/L 101 101 101  CO2 22 - 32 mmol/L 27 29 28   Calcium 8.9 - 10.3 mg/dL 9.2 9.4 9.4  Total Protein 6.5 - 8.1 g/dL - - 6.9  Total Bilirubin 0.3 - 1.2 mg/dL - - 0.8  Alkaline Phos 38 - 126 U/L - - 38  AST 15 - 41 U/L - - 18  ALT 0 - 44 U/L - - 13      Imaging: Reviewed prior CT imaging. Within last 24 hrs: No results found.  Assessment     Patient Active Problem List   Diagnosis Date Noted   Acute  appendicitis 09/25/2021   Needs flu shot 06/21/2020   Deep vein thrombosis (DVT) of proximal lower extremity ([place]) 05/15/2020   History of recent stroke 05/15/2020   Hospital discharge follow-up 01/20/2020   Dizziness 01/15/2020   Elevated troponin 01/15/2020   Cerebrovascular accident (CVA) ([place]) 01/15/2020   Encounter for general adult medical examination with abnormal findings 04/09/2018   Essential hypertension 11/27/2017   Mixed hyperlipidemia 11/27/2017   Peptic ulcer of stomach, chronic 11/27/2017   Low back pain without sciatica 11/27/2017   Accelerated hypertension 01/21/2017   Coronary artery disease due to lipid rich plaque 01/21/2017   GAD (generalized anxiety disorder) 01/21/2017   CKD (chronic kidney disease), stage III ([place]) 01/21/2017   Chest pain 11/25/2015    Plan    We will hold Plavix for 5 days preop. Schedule elective appendectomy, robotic assisted laparoscopic.   We will obtain medical clearance.  The risks, benefits, complications, treatment options, and expected outcomes were discussed with the patient. The treatment of antibiotics alone was discussed giving a 20% chance that this could fail and surgery would be necessary.  Also discussed continuing to the operating room for Laparoscopic Appendectomy.  The possibilities of  bleeding, recurrent infection, perforation of viscus, finding a normal appendix, the need for additional procedures, failure to diagnose a condition, conversion to open procedure and creating a complication requiring transfusion or further operations were discussed. The patient was given the opportunity to ask questions and have them answered.  Patient would like to proceed with Laparoscopic Appendectomy and consent was obtained.  CBC Latest Ref Rng & Units 09/26/2021 09/25/2021 08/23/2020  WBC 4.0 - 10.5 K/uL 6.5 9.6 5.1  Hemoglobin 13.0 - 17.0 g/dL 14.4 17.8(H) 16.6  Hematocrit 39.0 - 52.0 % 44.3 52.9(H) 49.2  Platelets 150 - 400 K/uL 179  217 177    Face-to-face time spent with the patient and accompanying care providers(if present) was 30 minutes, with more than 50% of the time spent counseling, educating, and coordinating care of the patient.    These notes generated with voice recognition software. I apologize for typographical errors.  Ronny Bacon M.D., FACS 10/27/2021, 3:22 PM

## 2021-10-28 ENCOUNTER — Telehealth: Payer: Self-pay | Admitting: Surgery

## 2021-10-28 NOTE — Telephone Encounter (Signed)
Patient has been advised of Pre-Admission date/time, COVID Testing date and Surgery date. ? ?Surgery Date: 11/11/21 ?Preadmission Testing Date: 11/03/21 (phone 8a-1p) ?Covid Testing Date:  Not needed    ? ?Patient has been made aware to call [phone], between 1-3:00pm the day before surgery, to find out what time to arrive for surgery.   ? ?

## 2021-10-28 NOTE — Telephone Encounter (Signed)
Medical Clearance received from [facility]- hold plavix for 5 days prior to procedure-medium risk-patient optimized for surgery.  ?

## 2021-11-02 ENCOUNTER — Ambulatory Visit: Payer: Self-pay | Admitting: Surgery

## 2021-11-02 DIAGNOSIS — K3589 Other acute appendicitis without perforation or gangrene: Secondary | ICD-10-CM

## 2021-11-03 ENCOUNTER — Other Ambulatory Visit: Payer: Self-pay

## 2021-11-03 ENCOUNTER — Encounter
Admission: RE | Admit: 2021-11-03 | Discharge: 2021-11-03 | Disposition: A | Discharge status: Home / Self Care | Payer: Medicare Other | Source: Ambulatory Visit | Attending: Surgery | Admitting: Surgery

## 2021-11-03 ENCOUNTER — Inpatient Hospital Stay: Payer: Medicare Other | Admitting: Surgery

## 2021-11-03 HISTORY — DX: Chronic kidney disease, stage 3 unspecified: N18.30

## 2021-11-03 HISTORY — DX: Hyperlipidemia, unspecified: E78.5

## 2021-11-03 HISTORY — DX: Depression, unspecified: F32.A

## 2021-11-03 HISTORY — DX: Cerebral infarction, unspecified: I63.9

## 2021-11-03 NOTE — Patient Instructions (Addendum)
Your procedure is scheduled on: Wednesday, March 15 ?Report to the Registration Desk on the 1st floor of the [facility]. ?To find out your arrival time, please call [phone] between 1PM - 3PM on: Tuesday, March 14 ? ?REMEMBER: ?Instructions that are not followed completely may result in serious medical risk, up to and including death; or upon the discretion of your surgeon and anesthesiologist your surgery may need to be rescheduled. ? ?Do not eat or drink after midnight the night before surgery.  ?No gum chewing, lozengers or hard candies. ? ?TAKE THESE MEDICATIONS THE MORNING OF SURGERY WITH A SIP OF WATER: ? ?Buspirone (Buspar) ?Clonazepam (Klonopin) ?Escitalopram (Lexapro) ?Isosorbide mononitrate ?Metoprolol ?Pantoprazole (Protonix) - (take one the night before and one on the morning of surgery - helps to prevent nausea after surgery.) ?Rosuvastatin (Crestor) ? ?Follow recommendations from Cardiologist, Pulmonologist or PCP regarding stopping Aspirin and Plavix. ?Stop Plavix 5 days prior to surgery. Resume AFTER surgery per surgeon instruction. ? ?One week prior to surgery: starting March 8 ?Stop Anti-inflammatories (NSAIDS) such as Advil, Aleve, Ibuprofen, Motrin, Naproxen, Naprosyn and Aspirin based products such as Excedrin, Goodys Powder, BC Powder. ?Stop ANY OVER THE COUNTER supplements until after surgery. ?You may however, continue to take Tylenol if needed for pain up until the day of surgery. ? ?No Alcohol for 24 hours before or after surgery. ? ?No Smoking including e-cigarettes for 24 hours prior to surgery.  ?No chewable tobacco products for at least 6 hours prior to surgery.  ?No nicotine patches on the day of surgery. ? ?Do not use any "recreational" drugs for at least a week prior to your surgery.  ?Please be advised that the combination of cocaine and anesthesia may have negative outcomes, up to and including death. ?If you test positive for cocaine, your surgery will be cancelled. ? ?On  the morning of surgery brush your teeth with toothpaste and water, you may rinse your mouth with mouthwash if you wish. ?Do not swallow any toothpaste or mouthwash. ? ?Use CHG Soap as directed on instruction sheet. ? ?Do not wear jewelry, make-up, hairpins, clips or nail polish. ? ?Do not wear lotions, powders, or perfumes.  ? ?Do not shave body from the neck down 48 hours prior to surgery just in case you cut yourself which could leave a site for infection.  ?Also, freshly shaved skin may become irritated if using the CHG soap. ? ?Contact lenses, hearing aids and dentures may not be worn into surgery. ? ?Do not bring valuables to the hospital. [facility] is not responsible for any missing/lost belongings or valuables.  ? ?Notify your doctor if there is any change in your medical condition (cold, fever, infection). ? ?Wear comfortable clothing (specific to your surgery type) to the hospital. ? ?After surgery, you can help prevent lung complications by doing breathing exercises.  ?Take deep breaths and cough every 1-2 hours. Your doctor may order a device called an Incentive Spirometer to help you take deep breaths. ?When coughing or sneezing, hold a pillow firmly against your incision with both hands. This is called ?splinting.? Doing this helps protect your incision. It also decreases belly discomfort. ? ?If you are being discharged the day of surgery, you will not be allowed to drive home. ?You will need a responsible adult (18 years or older) to drive you home and stay with you that night.  ? ?If you are taking public transportation, you will need to have a responsible adult (18 years or older)  with you. ?Please confirm with your physician that it is acceptable to use public transportation.  ? ?Please call the [facility] at [phone] if you have any questions about these instructions. ? ?Surgery Visitation Policy: ? ?Patients undergoing a surgery or procedure may have one family member or  support person with them as long as that person is not COVID-19 positive or experiencing its symptoms.  ?That person may remain in the waiting area during the procedure and may rotate out with other people. ?

## 2021-11-04 ENCOUNTER — Encounter: Payer: Self-pay | Admitting: Urgent Care

## 2021-11-04 ENCOUNTER — Encounter
Admission: RE | Admit: 2021-11-04 | Discharge: 2021-11-04 | Disposition: A | Discharge status: Home / Self Care | Payer: Medicare Other | Source: Ambulatory Visit | Attending: Surgery | Admitting: Surgery

## 2021-11-04 DIAGNOSIS — Z01812 Encounter for preprocedural laboratory examination: Secondary | ICD-10-CM | POA: Diagnosis not present

## 2021-11-04 DIAGNOSIS — K3589 Other acute appendicitis without perforation or gangrene: Secondary | ICD-10-CM | POA: Diagnosis not present

## 2021-11-04 DIAGNOSIS — Z01818 Encounter for other preprocedural examination: Secondary | ICD-10-CM | POA: Diagnosis present

## 2021-11-04 LAB — COMPREHENSIVE METABOLIC PANEL
ALT: 21 U/L (ref 0–44)
AST: 23 U/L (ref 15–41)
Albumin: 4.2 g/dL (ref 3.5–5.0)
Alkaline Phosphatase: 44 U/L (ref 38–126)
Anion gap: 7 (ref 5–15)
BUN: 19 mg/dL (ref 8–23)
CO2: 25 mmol/L (ref 22–32)
Calcium: 9.6 mg/dL (ref 8.9–10.3)
Chloride: 106 mmol/L (ref 98–111)
Creatinine, Ser: 1.32 mg/dL — ABNORMAL HIGH (ref 0.61–1.24)
GFR, Estimated: 59 mL/min — ABNORMAL LOW (ref >60)
Glucose, Bld: 122 mg/dL — ABNORMAL HIGH (ref 70–99)
Potassium: 4.2 mmol/L (ref 3.5–5.1)
Sodium: 138 mmol/L (ref 135–145)
Total Bilirubin: 0.7 mg/dL (ref 0.3–1.2)
Total Protein: 7.9 g/dL (ref 6.5–8.1)

## 2021-11-04 LAB — CBC WITH DIFFERENTIAL/PLATELET
Abs Immature Granulocytes: 0.02 10*3/uL (ref 0.00–0.07)
Basophils Absolute: 0 10*3/uL (ref 0.0–0.1)
Basophils Relative: 1 %
Eosinophils Absolute: 0.2 10*3/uL (ref 0.0–0.5)
Eosinophils Relative: 3 %
HCT: 45.3 % (ref 39.0–52.0)
Hemoglobin: 14.6 g/dL (ref 13.0–17.0)
Immature Granulocytes: 0 %
Lymphocytes Relative: 20 %
Lymphs Abs: 1 10*3/uL (ref 0.7–4.0)
MCH: 28.3 pg (ref 26.0–34.0)
MCHC: 32.2 g/dL (ref 30.0–36.0)
MCV: 87.8 fL (ref 80.0–100.0)
Monocytes Absolute: 0.5 10*3/uL (ref 0.1–1.0)
Monocytes Relative: 9 %
Neutro Abs: 3.6 10*3/uL (ref 1.7–7.7)
Neutrophils Relative %: 67 %
Platelets: 215 10*3/uL (ref 150–400)
RBC: 5.16 MIL/uL (ref 4.22–5.81)
RDW: 13.6 % (ref 11.5–15.5)
WBC: 5.3 10*3/uL (ref 4.0–10.5)
nRBC: 0 % (ref 0.0–0.2)

## 2021-11-10 MED ORDER — LACTATED RINGERS IV SOLN: INTRAVENOUS | Status: DC

## 2021-11-10 MED ORDER — CIPROFLOXACIN IN D5W 400 MG/200ML IV SOLN
400.0000 mg | INTRAVENOUS | Status: AC
  Administered 2021-11-11: 400 mg via INTRAVENOUS

## 2021-11-10 MED ORDER — ORAL CARE MOUTH RINSE: 15.0000 mL | Freq: Once | OROMUCOSAL | Status: AC

## 2021-11-10 MED ORDER — METRONIDAZOLE 500 MG/100ML IV SOLN
500.0000 mg | INTRAVENOUS | Status: AC
  Administered 2021-11-11: 500 mg via INTRAVENOUS
  Filled 2021-11-10: qty 100

## 2021-11-10 MED ORDER — CHLORHEXIDINE GLUCONATE CLOTH 2 % EX PADS
6.0000 | MEDICATED_PAD | Freq: Once | CUTANEOUS | Status: AC
  Administered 2021-11-11: 6 via TOPICAL

## 2021-11-10 MED ORDER — ACETAMINOPHEN 500 MG PO TABS: 1000.0000 mg | ORAL_TABLET | ORAL | Status: AC

## 2021-11-10 MED ORDER — BUPIVACAINE LIPOSOME 1.3 % IJ SUSP: 20.0000 mL | Freq: Once | INTRAMUSCULAR | Status: DC

## 2021-11-10 MED ORDER — CHLORHEXIDINE GLUCONATE 0.12 % MT SOLN: 15.0000 mL | Freq: Once | OROMUCOSAL | Status: AC

## 2021-11-10 MED ORDER — GABAPENTIN 300 MG PO CAPS: 300.0000 mg | ORAL_CAPSULE | ORAL | Status: AC

## 2021-11-10 MED ORDER — CHLORHEXIDINE GLUCONATE CLOTH 2 % EX PADS
6.0000 | MEDICATED_PAD | Freq: Once | CUTANEOUS | Status: AC
  Administered 2021-11-10: 6 via TOPICAL

## 2021-11-11 ENCOUNTER — Ambulatory Visit
Admission: RE | Admit: 2021-11-11 | Discharge: 2021-11-11 | Disposition: A | Discharge status: Home / Self Care | Payer: Medicare Other | Attending: Surgery | Admitting: Surgery

## 2021-11-11 ENCOUNTER — Ambulatory Visit: Payer: Medicare Other

## 2021-11-11 ENCOUNTER — Encounter: Payer: Self-pay | Admitting: Surgery

## 2021-11-11 ENCOUNTER — Encounter
Admission: RE | Disposition: A | Discharge status: Home / Self Care | Payer: Self-pay | Source: Home / Self Care | Attending: Surgery

## 2021-11-11 ENCOUNTER — Other Ambulatory Visit: Payer: Self-pay

## 2021-11-11 DIAGNOSIS — I252 Old myocardial infarction: Secondary | ICD-10-CM | POA: Insufficient documentation

## 2021-11-11 DIAGNOSIS — N189 Chronic kidney disease, unspecified: Secondary | ICD-10-CM | POA: Insufficient documentation

## 2021-11-11 DIAGNOSIS — I2510 Atherosclerotic heart disease of native coronary artery without angina pectoris: Secondary | ICD-10-CM | POA: Diagnosis not present

## 2021-11-11 DIAGNOSIS — E782 Mixed hyperlipidemia: Secondary | ICD-10-CM | POA: Diagnosis not present

## 2021-11-11 DIAGNOSIS — K3589 Other acute appendicitis without perforation or gangrene: Secondary | ICD-10-CM | POA: Diagnosis not present

## 2021-11-11 DIAGNOSIS — I129 Hypertensive chronic kidney disease with stage 1 through stage 4 chronic kidney disease, or unspecified chronic kidney disease: Secondary | ICD-10-CM | POA: Insufficient documentation

## 2021-11-11 DIAGNOSIS — Z7902 Long term (current) use of antithrombotics/antiplatelets: Secondary | ICD-10-CM | POA: Diagnosis not present

## 2021-11-11 DIAGNOSIS — K37 Unspecified appendicitis: Secondary | ICD-10-CM | POA: Diagnosis not present

## 2021-11-11 DIAGNOSIS — K660 Peritoneal adhesions (postprocedural) (postinfection): Secondary | ICD-10-CM | POA: Insufficient documentation

## 2021-11-11 DIAGNOSIS — K3580 Unspecified acute appendicitis: Secondary | ICD-10-CM | POA: Insufficient documentation

## 2021-11-11 SURGERY — APPENDECTOMY, ROBOT-ASSISTED, LAPAROSCOPIC
Anesthesia: General | Site: Abdomen

## 2021-11-11 MED ORDER — DEXAMETHASONE SODIUM PHOSPHATE 10 MG/ML IJ SOLN
INTRAMUSCULAR | Status: AC
  Filled 2021-11-11: qty 1

## 2021-11-11 MED ORDER — FENTANYL CITRATE (PF) 100 MCG/2ML IJ SOLN
INTRAMUSCULAR | Status: AC
  Filled 2021-11-11: qty 2

## 2021-11-11 MED ORDER — DEXAMETHASONE SODIUM PHOSPHATE 10 MG/ML IJ SOLN
INTRAMUSCULAR | Status: DC | PRN
Start: 2021-11-11 — End: 2021-11-11
  Administered 2021-11-11: 4 mg via INTRAVENOUS

## 2021-11-11 MED ORDER — DEXMEDETOMIDINE HCL IN NACL 200 MCG/50ML IV SOLN
INTRAVENOUS | Status: AC
  Filled 2021-11-11: qty 50

## 2021-11-11 MED ORDER — BUPIVACAINE LIPOSOME 1.3 % IJ SUSP
INTRAMUSCULAR | Status: AC
  Filled 2021-11-11: qty 20

## 2021-11-11 MED ORDER — HYDRALAZINE HCL 20 MG/ML IJ SOLN
10.0000 mg | Freq: Once | INTRAMUSCULAR | Status: AC
  Administered 2021-11-11: 10 mg via INTRAVENOUS

## 2021-11-11 MED ORDER — ROCURONIUM BROMIDE 100 MG/10ML IV SOLN
INTRAVENOUS | Status: DC | PRN
  Administered 2021-11-11: 30 mg via INTRAVENOUS
  Administered 2021-11-11 (×2): 10 mg via INTRAVENOUS

## 2021-11-11 MED ORDER — CHLORHEXIDINE GLUCONATE 0.12 % MT SOLN
OROMUCOSAL | Status: AC
  Administered 2021-11-11: 15 mL via OROMUCOSAL
  Filled 2021-11-11: qty 15

## 2021-11-11 MED ORDER — KETAMINE HCL 10 MG/ML IJ SOLN
INTRAMUSCULAR | Status: DC | PRN
  Administered 2021-11-11: 30 mg via INTRAVENOUS

## 2021-11-11 MED ORDER — 0.9 % SODIUM CHLORIDE (POUR BTL) OPTIME
TOPICAL | Status: DC | PRN
  Administered 2021-11-11: 500 mL

## 2021-11-11 MED ORDER — GLYCOPYRROLATE 0.2 MG/ML IJ SOLN
INTRAMUSCULAR | Status: DC | PRN
  Administered 2021-11-11: .2 mg via INTRAVENOUS

## 2021-11-11 MED ORDER — BUPIVACAINE-EPINEPHRINE (PF) 0.25% -1:200000 IJ SOLN
INTRAMUSCULAR | Status: DC | PRN
  Administered 2021-11-11: 30 mL

## 2021-11-11 MED ORDER — ONDANSETRON HCL 4 MG/2ML IJ SOLN
INTRAMUSCULAR | Status: DC | PRN
  Administered 2021-11-11: 4 mg via INTRAVENOUS

## 2021-11-11 MED ORDER — PHENYLEPHRINE 40 MCG/ML (10ML) SYRINGE FOR IV PUSH (FOR BLOOD PRESSURE SUPPORT)
PREFILLED_SYRINGE | INTRAVENOUS | Status: DC | PRN
  Administered 2021-11-11 (×3): 160 ug via INTRAVENOUS

## 2021-11-11 MED ORDER — LABETALOL HCL 5 MG/ML IV SOLN
INTRAVENOUS | Status: DC | PRN
  Administered 2021-11-11 (×2): 5 mg via INTRAVENOUS

## 2021-11-11 MED ORDER — LIDOCAINE HCL (PF) 2 % IJ SOLN
INTRAMUSCULAR | Status: AC
  Filled 2021-11-11: qty 5

## 2021-11-11 MED ORDER — SUGAMMADEX SODIUM 200 MG/2ML IV SOLN
INTRAVENOUS | Status: DC | PRN
  Administered 2021-11-11: 200 mg via INTRAVENOUS

## 2021-11-11 MED ORDER — EPHEDRINE 5 MG/ML INJ
INTRAVENOUS | Status: AC
  Filled 2021-11-11: qty 5

## 2021-11-11 MED ORDER — PROPOFOL 10 MG/ML IV BOLUS
INTRAVENOUS | Status: DC | PRN
  Administered 2021-11-11: 200 mg via INTRAVENOUS

## 2021-11-11 MED ORDER — PROPOFOL 10 MG/ML IV BOLUS
INTRAVENOUS | Status: AC
  Filled 2021-11-11: qty 40

## 2021-11-11 MED ORDER — ACETAMINOPHEN 500 MG PO TABS
ORAL_TABLET | ORAL | Status: AC
  Administered 2021-11-11: 1000 mg via ORAL
  Filled 2021-11-11: qty 2

## 2021-11-11 MED ORDER — MIDAZOLAM HCL 2 MG/2ML IJ SOLN
INTRAMUSCULAR | Status: AC
  Filled 2021-11-11: qty 2

## 2021-11-11 MED ORDER — SUCCINYLCHOLINE CHLORIDE 200 MG/10ML IV SOSY
PREFILLED_SYRINGE | INTRAVENOUS | Status: DC | PRN
  Administered 2021-11-11: 120 mg via INTRAVENOUS

## 2021-11-11 MED ORDER — HYDROCODONE-ACETAMINOPHEN 5-325 MG PO TABS: 1.0000 | ORAL_TABLET | Freq: Four times a day (QID) | ORAL | 0 refills | Status: DC | PRN

## 2021-11-11 MED ORDER — FENTANYL CITRATE (PF) 100 MCG/2ML IJ SOLN
INTRAMUSCULAR | Status: DC | PRN
  Administered 2021-11-11: 50 ug via INTRAVENOUS
  Administered 2021-11-11 (×2): 25 ug via INTRAVENOUS

## 2021-11-11 MED ORDER — LABETALOL HCL 5 MG/ML IV SOLN
INTRAVENOUS | Status: AC
  Filled 2021-11-11: qty 4

## 2021-11-11 MED ORDER — CIPROFLOXACIN IN D5W 400 MG/200ML IV SOLN
INTRAVENOUS | Status: AC
Start: 2021-11-11 — End: 2021-11-11
  Filled 2021-11-11: qty 200

## 2021-11-11 MED ORDER — FENTANYL CITRATE (PF) 100 MCG/2ML IJ SOLN
25.0000 ug | INTRAMUSCULAR | Status: DC | PRN
  Administered 2021-11-11 (×3): 25 ug via INTRAVENOUS

## 2021-11-11 MED ORDER — FENTANYL CITRATE (PF) 100 MCG/2ML IJ SOLN
INTRAMUSCULAR | Status: AC
  Administered 2021-11-11: 25 ug via INTRAVENOUS
  Filled 2021-11-11: qty 2

## 2021-11-11 MED ORDER — ONDANSETRON HCL 4 MG/2ML IJ SOLN: 4.0000 mg | Freq: Once | INTRAMUSCULAR | Status: DC | PRN

## 2021-11-11 MED ORDER — EPHEDRINE SULFATE (PRESSORS) 50 MG/ML IJ SOLN
INTRAMUSCULAR | Status: DC | PRN
Start: 2021-11-11 — End: 2021-11-11
  Administered 2021-11-11: 5 mg via INTRAVENOUS

## 2021-11-11 MED ORDER — HYDRALAZINE HCL 20 MG/ML IJ SOLN
INTRAMUSCULAR | Status: AC
  Filled 2021-11-11: qty 1

## 2021-11-11 MED ORDER — LIDOCAINE HCL (CARDIAC) PF 100 MG/5ML IV SOSY
PREFILLED_SYRINGE | INTRAVENOUS | Status: DC | PRN
  Administered 2021-11-11: 100 mg via INTRAVENOUS

## 2021-11-11 MED ORDER — KETAMINE HCL 50 MG/5ML IJ SOSY
PREFILLED_SYRINGE | INTRAMUSCULAR | Status: AC
  Filled 2021-11-11: qty 5

## 2021-11-11 MED ORDER — BUPIVACAINE-EPINEPHRINE (PF) 0.25% -1:200000 IJ SOLN
INTRAMUSCULAR | Status: AC
  Filled 2021-11-11: qty 30

## 2021-11-11 MED ORDER — ONDANSETRON HCL 4 MG/2ML IJ SOLN
INTRAMUSCULAR | Status: AC
  Filled 2021-11-11: qty 2

## 2021-11-11 MED ORDER — PHENYLEPHRINE HCL-NACL 20-0.9 MG/250ML-% IV SOLN
INTRAVENOUS | Status: DC | PRN
  Administered 2021-11-11: 30 ug/min via INTRAVENOUS

## 2021-11-11 MED ORDER — GABAPENTIN 300 MG PO CAPS
ORAL_CAPSULE | ORAL | Status: AC
  Administered 2021-11-11: 300 mg via ORAL
  Filled 2021-11-11: qty 1

## 2021-11-11 MED ORDER — OXYCODONE HCL 5 MG PO TABS
ORAL_TABLET | ORAL | Status: AC
  Administered 2021-11-11: 5 mg via ORAL
  Filled 2021-11-11: qty 1

## 2021-11-11 MED ORDER — OXYCODONE HCL 5 MG PO TABS: 5.0000 mg | ORAL_TABLET | Freq: Once | ORAL | Status: AC

## 2021-11-11 MED ORDER — MIDAZOLAM HCL 2 MG/2ML IJ SOLN
INTRAMUSCULAR | Status: DC | PRN
  Administered 2021-11-11: 2 mg via INTRAVENOUS

## 2021-11-11 SURGICAL SUPPLY — 66 items
ADH SKN CLS APL DERMABOND .7 (GAUZE/BANDAGES/DRESSINGS) ×1
BAG RETRIEVAL 10 (BASKET)
BLADE CLIPPER SURG (BLADE) IMPLANT
CANNULA CAP OBTURATR AIRSEAL 8 (CAP) ×2 IMPLANT
CLIP VESOLOCK MED LG 6/CT (CLIP) IMPLANT
COVER TIP SHEARS 8 DVNC (MISCELLANEOUS) ×1 IMPLANT
COVER TIP SHEARS 8MM DA VINCI (MISCELLANEOUS) ×1
CUTTER FLEX LINEAR 45M (STAPLE) ×1 IMPLANT
DERMABOND ADVANCED (GAUZE/BANDAGES/DRESSINGS) ×1
DERMABOND ADVANCED .7 DNX12 (GAUZE/BANDAGES/DRESSINGS) ×1 IMPLANT
DRAPE ARM DVNC X/XI (DISPOSABLE) ×3 IMPLANT
DRAPE COLUMN DVNC XI (DISPOSABLE) ×1 IMPLANT
DRAPE DA VINCI XI ARM (DISPOSABLE) ×4
DRAPE DA VINCI XI COLUMN (DISPOSABLE) ×1
GLOVE SURG ORTHO LTX SZ7.5 (GLOVE) ×4 IMPLANT
GOWN STRL REUS W/ TWL LRG LVL3 (GOWN DISPOSABLE) ×1 IMPLANT
GOWN STRL REUS W/ TWL XL LVL3 (GOWN DISPOSABLE) ×2 IMPLANT
GOWN STRL REUS W/TWL LRG LVL3 (GOWN DISPOSABLE) ×2
GOWN STRL REUS W/TWL XL LVL3 (GOWN DISPOSABLE) ×4
GRASPER SUT TROCAR 14GX15 (MISCELLANEOUS) IMPLANT
INFUSOR MANOMETER BAG 3000ML (MISCELLANEOUS) IMPLANT
IRRIGATION STRYKERFLOW (MISCELLANEOUS) IMPLANT
IRRIGATOR STRYKERFLOW (MISCELLANEOUS)
IRRIGATOR SUCT 8 DISP DVNC XI (IRRIGATION / IRRIGATOR) IMPLANT
IRRIGATOR SUCTION 8MM XI DISP (IRRIGATION / IRRIGATOR)
IV NS IRRIG 3000ML ARTHROMATIC (IV SOLUTION) IMPLANT
KIT PINK PAD W/HEAD ARE REST (MISCELLANEOUS) ×2
KIT PINK PAD W/HEAD ARM REST (MISCELLANEOUS) ×1 IMPLANT
KIT TURNOVER KIT A (KITS) ×2 IMPLANT
LABEL OR SOLS (LABEL) ×2 IMPLANT
MANIFOLD NEPTUNE II (INSTRUMENTS) ×2 IMPLANT
NDL INSUFFLATION 14GA 120MM (NEEDLE) IMPLANT
NEEDLE HYPO 22GX1.5 SAFETY (NEEDLE) ×2 IMPLANT
NEEDLE INSUFFLATION 14GA 120MM (NEEDLE) IMPLANT
NS IRRIG 500ML POUR BTL (IV SOLUTION) ×2 IMPLANT
PACK LAP CHOLECYSTECTOMY (MISCELLANEOUS) ×2 IMPLANT
RELOAD 45 VASCULAR/THIN (ENDOMECHANICALS) ×2 IMPLANT
RELOAD STAPLE 45 2.5 WHT GRN (ENDOMECHANICALS) IMPLANT
RELOAD STAPLE 45 3.5 BLU DVNC (STAPLE) IMPLANT
RELOAD STAPLE 60 3.5 BLU DVNC (STAPLE) IMPLANT
RELOAD STAPLER 3.5X45 BLU DVNC (STAPLE) IMPLANT
RELOAD STAPLER 3.5X60 BLU DVNC (STAPLE) IMPLANT
SCISSORS METZENBAUM CVD 33 (INSTRUMENTS) ×1 IMPLANT
SEAL CANN UNIV 5-8 DVNC XI (MISCELLANEOUS) ×2 IMPLANT
SEAL XI 5MM-8MM UNIVERSAL (MISCELLANEOUS) ×2
SET TUBE FILTERED XL AIRSEAL (SET/KITS/TRAYS/PACK) ×2 IMPLANT
SPIKE FLUID TRANSFER (MISCELLANEOUS) ×2 IMPLANT
STAPLER 45 DA VINCI SURE FORM (STAPLE)
STAPLER 45 SUREFORM DVNC (STAPLE) IMPLANT
STAPLER 60 DA VINCI SURE FORM (STAPLE)
STAPLER 60 SUREFORM DVNC (STAPLE) IMPLANT
STAPLER RELOAD 3.5X45 BLU DVNC (STAPLE)
STAPLER RELOAD 3.5X45 BLUE (STAPLE)
STAPLER RELOAD 3.5X60 BLU DVNC (STAPLE)
STAPLER RELOAD 3.5X60 BLUE (STAPLE)
SUT MNCRL 4-0 (SUTURE) ×2
SUT MNCRL 4-0 27XMFL (SUTURE) ×1
SUT SILK 2 0 SH (SUTURE) ×2 IMPLANT
SUT VIC AB 0 SH 27 (SUTURE) ×1 IMPLANT
SUT VICRYL 0 AB UR-6 (SUTURE) ×1 IMPLANT
SUTURE MNCRL 4-0 27XMF (SUTURE) ×1 IMPLANT
SYS BAG RETRIEVAL 10MM (BASKET)
SYSTEM BAG RETRIEVAL 10MM (BASKET) ×1 IMPLANT
TRAY FOLEY SLVR 16FR LF STAT (SET/KITS/TRAYS/PACK) ×1 IMPLANT
TROCAR Z-THREAD FIOS 11X100 BL (TROCAR) ×2 IMPLANT
WATER STERILE IRR 500ML POUR (IV SOLUTION) ×2 IMPLANT

## 2021-11-11 NOTE — Anesthesia Procedure Notes (Signed)
Procedure Name: Intubation ?Date/Time: 11/11/2021 11:04 AM ?Performed by: Aline Brochure, CRNA ?Pre-anesthesia Checklist: Patient identified, Patient being monitored, Timeout performed, Emergency Drugs available and Suction available ?Patient Re-evaluated:Patient Re-evaluated prior to induction ?Oxygen Delivery Method: Circle system utilized ?Preoxygenation: Pre-oxygenation with 100% oxygen ?Induction Type: IV induction and Rapid sequence ?Laryngoscope Size: McGraph and 4 ?Grade View: Grade I ?Tube type: Oral ?Tube size: 7.5 mm ?Number of attempts: 1 ?Airway Equipment and Method: Stylet and Video-laryngoscopy ?Placement Confirmation: ETT inserted through vocal cords under direct vision, positive ETCO2 and breath sounds checked- equal and bilateral ?Secured at: 23 cm ?Tube secured with: Tape ?Dental Injury: Teeth and Oropharynx as per pre-operative assessment  ? ? ? ? ?

## 2021-11-11 NOTE — Discharge Instructions (Signed)

## 2021-11-11 NOTE — Anesthesia Postprocedure Evaluation (Signed)
Anesthesia Post Note ? ?Patient: NAEEM QUILLIN ? ?Procedure(s) Performed: XI ROBOTIC LAPAROSCOPIC ASSISTED APPENDECTOMY (Abdomen) ? ?Patient location during evaluation: PACU ?Anesthesia Type: General ?Level of consciousness: awake and awake and alert ?Pain management: satisfactory to patient ?Vital Signs Assessment: post-procedure vital signs reviewed and stable ?Respiratory status: spontaneous breathing and respiratory function stable ?Cardiovascular status: stable ?Anesthetic complications: no ? ? ?No notable events documented. ? ? ?Last Vitals:  ?Vitals:  ? 11/11/21 1330 11/11/21 1352  ?BP: (!) 133/97 (!) 137/101  ?Pulse: 98 93  ?Resp: (!) 27 18  ?Temp: (!) 36.3 ?C (!) 36.4 ?C  ?SpO2: 93% 98%  ?  ?Last Pain:  ?Vitals:  ? 11/11/21 1352  ?TempSrc: Temporal  ?PainSc: 4   ? ? ?  ?  ?  ?  ?  ?  ? ?VAN STAVEREN,Sophonie Goforth ? ? ? ? ?

## 2021-11-11 NOTE — Transfer of Care (Signed)
Immediate Anesthesia Transfer of Care Note ? ?Patient: Howard Weaver ? ?Procedure(s) Performed: XI ROBOTIC LAPAROSCOPIC ASSISTED APPENDECTOMY (Abdomen) ? ?Patient Location: PACU ? ?Anesthesia Type:General ? ?Level of Consciousness: awake ? ?Airway & Oxygen Therapy: Patient Spontanous Breathing and Patient connected to face mask oxygen ? ?Post-op Assessment: Report given to RN and Post -op Vital signs reviewed and stable ? ?Post vital signs: Reviewed and stable ? ?Last Vitals:  ?Vitals Value Taken Time  ?BP    ?Temp    ?Pulse 99 11/11/21 1243  ?Resp 22 11/11/21 1243  ?SpO2 90 % 11/11/21 1243  ?Vitals shown include unvalidated device data. ? ?Last Pain:  ?Vitals:  ? 11/11/21 0924  ?TempSrc: Oral  ?PainSc: 0-No pain  ?   ? ?Patients Stated Pain Goal: 0 (11/11/21 3832) ? ?Complications: No notable events documented. ?

## 2021-11-11 NOTE — Interval H&P Note (Signed)
History and Physical Interval Note: ? ?11/11/2021 ?10:35 AM ? ?Howard Weaver  has presented today for surgery, with the diagnosis of appendicitis.  The various methods of treatment have been discussed with the patient and family. After consideration of risks, benefits and other options for treatment, the patient has consented to  Procedure(s): ?XI ROBOTIC LAPAROSCOPIC ASSISTED APPENDECTOMY (N/A) as a surgical intervention.  The patient's history has been reviewed, patient examined, no change in status, stable for surgery.  I have reviewed the patient's chart and labs.  Questions were answered to the patient's satisfaction.   ? ? ?[name] ? ? ?

## 2021-11-11 NOTE — Progress Notes (Signed)
Per anesthesia:  Dr. Charlann Boxer  ok for patient to be discharged to home, DBP100-104, instructed to take BP medications when he arrives home, also aware of heart rate 90's to low 100's, ok for discharge.   ?

## 2021-11-11 NOTE — Anesthesia Preprocedure Evaluation (Addendum)
Anesthesia Evaluation  ?Patient identified by MRN, date of birth, ID band ?Patient awake ? ? ? ?Reviewed: ?Allergy & Precautions, NPO status , Patient's Chart, lab work & pertinent test results ? ?Airway ?Mallampati: III ? ?TM Distance: >3 FB ?Neck ROM: Full ? ? ? Dental ? ?(+) Poor Dentition, Dental Advisory Given ?  ?Pulmonary ?neg pulmonary ROS, sleep apnea , COPD, former smoker,  ?  ?Pulmonary exam normal ? ?+ decreased breath sounds ? ? ? ? ? Cardiovascular ?Exercise Tolerance: Good ?hypertension, Pt. on medications ?+ CAD and + Past MI  ?negative cardio ROS ?Normal cardiovascular exam ?Rhythm:Regular Rate:Normal ? ? ?  ?Neuro/Psych ?Anxiety Depression CVA, No Residual Symptoms negative neurological ROS ? negative psych ROS  ? GI/Hepatic ?negative GI ROS, Neg liver ROS, PUD,   ?Endo/Other  ?negative endocrine ROS ? Renal/GU ?CRFRenal disease  ? ?  ?Musculoskeletal ?negative musculoskeletal ROS ?(+)  ? Abdominal ?(+) + obese,   ?Peds ?negative pediatric ROS ?(+)  Hematology ?negative hematology ROS ?(+)   ?Anesthesia Other Findings ?Past Medical History: ?09/24/2021: Appendicitis ?2018: Bleeding stomach ulcer ?2014: Brain aneurysm ?No date: CAD (coronary artery disease) ?No date: Chest pain ?No date: CKD (chronic kidney disease) stage 3, GFR 30-59 ml/min (HCC) ?No date: Depression ?No date: DU (duodenal ulcer) ?01/16/2020: DVT of leg (deep venous thrombosis) ([place]) ?No date: GAD (generalized anxiety disorder) ?No date: Hyperlipidemia ?No date: Hypertension ?No date: Myocardial infarction [facility]) ?    Comment:  2017 & 2019 ?No date: Radiculopathy of thoracolumbar region ?No date: Stroke [facility]) ?    Comment:  2014 & 2021 ?No date: SVT (supraventricular tachycardia) ([place]) ?No date: Tachycardia ? ?Past Surgical History: ?11/25/2015: CARDIAC CATHETERIZATION; Right ?    Comment:  Procedure: Left Heart Cath and Coronary Angiography;   ?             Surgeon: Dionisio David, MD;  Location:  [place] [facility]  ?             LAB;  Service: Cardiovascular;  Laterality: Right; ?2014: CEREBRAL ANEURYSM REPAIR ?04/12/2014: ESOPHAGOGASTRODUODENOSCOPY ?No date: STOMACH SURGERY ?    Comment:  bleeding ulcer repair ?01/17/2020: TEE WITHOUT CARDIOVERSION; Right ?    Comment:  Procedure: TRANSESOPHAGEAL ECHOCARDIOGRAM (TEE);   ?             Surgeon: Dionisio David, MD;  Location: [facility] ORS;   ?             Service: Cardiovascular;  Laterality: Right; ? ? ? ? Reproductive/Obstetrics ?negative OB ROS ? ?  ? ? ? ? ? ? ? ? ? ? ? ? ? ?  ?  ? ? ? ? ? ? ?Anesthesia Physical ?Anesthesia Plan ? ?ASA: 3 ? ?Anesthesia Plan: General  ? ?Post-op Pain Management:   ? ?Induction: Intravenous ? ?PONV Risk Score and Plan: Ondansetron, Dexamethasone, Midazolam and Treatment may vary due to age or medical condition ? ?Airway Management Planned: Oral ETT ? ?Additional Equipment:  ? ?Intra-op Plan:  ? ?Post-operative Plan: Extubation in OR ? ?Informed Consent: I have reviewed the patients History and Physical, chart, labs and discussed the procedure including the risks, benefits and alternatives for the proposed anesthesia with the patient or authorized representative who has indicated his/her understanding and acceptance.  ? ? ? ?Dental Advisory Given ? ?Plan Discussed with: CRNA and Surgeon ? ?Anesthesia Plan Comments:   ? ? ? ? ? ? ?Anesthesia Quick Evaluation ? ?

## 2021-11-11 NOTE — Op Note (Signed)
Robotic appendectomy, with lysis of adhesions. ? ?Pre-operative Diagnosis: Acute appendicitis ? ?Post-operative Diagnosis: same.  ? ?Surgeon: Ronny Bacon, M.D., FACS ? ?Anesthesia: General endotracheal ? ?Findings: Midline anterior abdominal wall adhesions, with right lower quadrant adhesions. ? ?Estimated Blood Loss: 15 mL ?        ?Specimens: Appendix    ?      ?Complications: none ?        ?     ?Procedure Details  ?The patient was seen again in the Holding Room. The benefits, complications, treatment options, and expected outcomes were discussed with the patient. The risks of bleeding, infection, recurrence of symptoms, failure to resolve symptoms, unanticipated injury, prosthetic placement, prosthetic infection, any of which could require further surgery were reviewed with the patient. The likelihood of improving the patient's symptoms with return to their baseline status is good.  The patient and/or family concurred with the proposed plan, giving informed consent.  The patient was taken to Operating Room, identified and the procedure verified.   ? ?Prior to the induction of general anesthesia, antibiotic prophylaxis was administered. VTE prophylaxis was in place.  General anesthesia was then administered and tolerated well. After the induction, the patient was positioned in the supine position and the abdomen was prepped with Chloraprep and draped in the sterile fashion.  ?A Time Out was held and the above information confirmed. ? ?After local infiltration of quarter percent Marcaine with epinephrine, stab incision was made left upper quadrant.  Just below the costal margin approximately midclavicular line the Veress needle is passed with sensation of the layers to penetrate the abdominal wall and into the peritoneum.  Saline drop test is confirmed peritoneal placement.  Insufflation is initiated with carbon dioxide to pressures of 15 mmHg. ? ?With local anesthetic infiltration, a left subcostal incision  is made, and an optical 12 mm trocar is passed into the peritoneal cavity under direct visualization.   ?Using hot laparoscopic scissors a proceeded with taking down some of the anterior abdominal wall adhesions along the epigastrium midline.  Surprisingly there was a loop of small bowel within these adhesions and I noted a small brief area of serosal discoloration consistent with a low-grade thermal injury.  ?An additional three 8.5 mm robotic trochars are placed in the suprapubic area and in the left abdominal wall under direct visualization. ?Using a fenestrated tips up grasper, a fenestrated bipolar grasper and monopolar scissors proceeded with dissecting out the soft tissues, from the anterior abdominal wall, freeing up the area of questionable terminal injury to the segment of small bowel.  I chose to utilize to 2-0 silk sutures to imbricate this area of discoloration/thermal injury to ensure it would not progress to perforation or stricture.   ?I then proceeded with dissecting the soft tissues adjacent to the cecum and appendix to fully identify the appendix, and mobilize it. ?The mesoappendix was carefully divided utilizing bipolar cautery, monopolar cautery and scissors. ?With the appendiceal cecal junction fully isolated, the appendiceal base was found to be wider than I expected, and felt it prudent to utilize a laparoscopic stapler for its division. ?We then undocked the robot and proceeded with completing the procedure laparoscopically. ?Utilizing a 45 mm endoscopic stapler on a white cartridge proceeded with division of the appendix at the cecal junction.  An excellent staple line with excellent hemostasis. ?We then placed the appendix in a retrieval bag and withdrew it out the largest port site. ?The left upper quadrant port site was covered nicely by  the costal margin after desufflation and did not require any further fascial closure.  The remainder of the local anesthetic was infiltrated into these  areas. ?The abdomen was then finally desufflated and the trochars removed. ?Incisions were then irrigated and closed with subcuticulars of 4-0 Monocryl.  Skin sealed with Dermabond. ? ?Patient tolerated procedure well. ? ? ? ?Ronny Bacon M.D., FACS ?[facility] ?11/11/2021 12:41 PM    ?

## 2021-11-12 ENCOUNTER — Encounter: Payer: Self-pay | Admitting: Surgery

## 2021-11-12 LAB — SURGICAL PATHOLOGY

## 2021-11-26 ENCOUNTER — Encounter: Payer: Medicare Other | Admitting: Physician Assistant

## 2021-12-01 ENCOUNTER — Encounter: Payer: Medicare Other | Admitting: Physician Assistant

## 2022-02-19 ENCOUNTER — Ambulatory Visit: Payer: Medicare Other | Admitting: Nurse Practitioner

## 2022-03-12 ENCOUNTER — Other Ambulatory Visit: Payer: Self-pay | Admitting: Nurse Practitioner

## 2022-03-18 ENCOUNTER — Telehealth: Payer: Self-pay

## 2022-03-18 NOTE — Telephone Encounter (Signed)
Pt son called he is not in the HIPPA asking for med refill for clonazepam advised him that they update HIPPA and also pt need appt for refill and gave toni update phone no and put note for next visit to update HIPPA form

## 2022-03-23 ENCOUNTER — Ambulatory Visit: Payer: Medicare Other | Admitting: Nurse Practitioner

## 2022-04-01 ENCOUNTER — Other Ambulatory Visit: Payer: Self-pay | Admitting: Nurse Practitioner

## 2022-04-01 DIAGNOSIS — K257 Chronic gastric ulcer without hemorrhage or perforation: Secondary | ICD-10-CM

## 2022-04-27 ENCOUNTER — Other Ambulatory Visit: Payer: Self-pay | Admitting: Nurse Practitioner

## 2022-06-04 ENCOUNTER — Telehealth: Payer: Self-pay | Admitting: Nurse Practitioner

## 2022-06-04 NOTE — Telephone Encounter (Signed)
[place] called. [place]. Has 06/10/22, but is out of all his meds. Requesting refills to be sent to [facility] in [place]

## 2022-06-10 ENCOUNTER — Telehealth: Payer: Self-pay | Admitting: Nurse Practitioner

## 2022-06-10 ENCOUNTER — Ambulatory Visit (INDEPENDENT_AMBULATORY_CARE_PROVIDER_SITE_OTHER): Payer: Medicare Other | Admitting: Nurse Practitioner

## 2022-06-10 ENCOUNTER — Encounter: Payer: Self-pay | Admitting: Nurse Practitioner

## 2022-06-10 VITALS — BP 118/87 | HR 64 | Temp 95.6°F | Resp 16 | Ht 69.0 in | Wt 221.6 lb

## 2022-06-10 DIAGNOSIS — Z808 Family history of malignant neoplasm of other organs or systems: Secondary | ICD-10-CM | POA: Diagnosis not present

## 2022-06-10 DIAGNOSIS — Z760 Encounter for issue of repeat prescription: Secondary | ICD-10-CM | POA: Diagnosis not present

## 2022-06-10 DIAGNOSIS — R2990 Unspecified symptoms and signs involving the nervous system: Secondary | ICD-10-CM

## 2022-06-10 DIAGNOSIS — R531 Weakness: Secondary | ICD-10-CM

## 2022-06-10 MED ORDER — ENALAPRIL MALEATE 20 MG PO TABS: 40.0000 mg | ORAL_TABLET | Freq: Every day | ORAL | 1 refills | Status: DC

## 2022-06-10 MED ORDER — ROSUVASTATIN CALCIUM 40 MG PO TABS: 40.0000 mg | ORAL_TABLET | Freq: Every day | ORAL | 1 refills | Status: DC

## 2022-06-10 MED ORDER — METOPROLOL SUCCINATE ER 100 MG PO TB24: 100.0000 mg | ORAL_TABLET | Freq: Every day | ORAL | 1 refills | Status: DC

## 2022-06-10 MED ORDER — ISOSORBIDE MONONITRATE ER 30 MG PO TB24: 30.0000 mg | ORAL_TABLET | Freq: Every day | ORAL | 1 refills | Status: DC

## 2022-06-10 MED ORDER — ESCITALOPRAM OXALATE 20 MG PO TABS: 20.0000 mg | ORAL_TABLET | Freq: Every day | ORAL | 1 refills | Status: DC

## 2022-06-10 MED ORDER — CLOPIDOGREL BISULFATE 75 MG PO TABS: 75.0000 mg | ORAL_TABLET | Freq: Every day | ORAL | 1 refills | Status: DC

## 2022-06-10 MED ORDER — PANTOPRAZOLE SODIUM 40 MG PO TBEC: 40.0000 mg | DELAYED_RELEASE_TABLET | Freq: Every day | ORAL | 1 refills | Status: DC

## 2022-06-10 MED ORDER — CHLORTHALIDONE 25 MG PO TABS: 25.0000 mg | ORAL_TABLET | Freq: Every day | ORAL | 1 refills | Status: DC

## 2022-06-10 MED ORDER — CLONAZEPAM 0.5 MG PO TABS: ORAL_TABLET | ORAL | 0 refills | Status: DC

## 2022-06-10 MED ORDER — CLONIDINE HCL 0.3 MG PO TABS: 0.3000 mg | ORAL_TABLET | Freq: Every day | ORAL | 1 refills | Status: DC

## 2022-06-10 NOTE — Telephone Encounter (Signed)
Unable to reach son via phone. Sent him a mychart message to call me regarding CT and follow up appointment-Toni

## 2022-06-10 NOTE — Progress Notes (Signed)
[facility], [place] [address]  Internal MEDICINE  Office Visit Note  Patient Name: Howard Weaver  [id number]  [id number]  Date of Service: 06/10/2022  Chief Complaint  Patient presents with   Follow-up    Follow up med/ not feeling good.   Hyperlipidemia   Hypertension   Depression     HPI Howard Weaver presents for an acute sick visit for possible stroke like symptoms.  --hx prior stroke and MI New sx onset approx 2 weeks ago--- weakened grip strength of left hand, Grip strength R>L Off balance, left side feels weaker Head feels pressure, fullness, achy, cluster headaches over eyes.  No change in vision  Weakness in left leg and left arm, no pain Speech slightly slurred, smile asymmetric  Left side facial, weakness slight droop.  ---brother recently died from brain cancer   Current Medication:  Outpatient Encounter Medications as of 06/10/2022  Medication Sig   aspirin 81 MG EC tablet Take 1 tablet (81 mg total) by mouth daily.   busPIRone (BUSPAR) 15 MG tablet Take 15 mg twice a day   HYDROcodone-acetaminophen (NORCO/VICODIN) 5-325 MG tablet Take 1 tablet by mouth every 6 (six) hours as needed for moderate pain.   PFIZER COVID-19 VAC BIVALENT injection    PREVNAR 20 0.5 ML SUSY    [DISCONTINUED] chlorthalidone (HYGROTON) 25 MG tablet Take 1 tablet (25 mg total) by mouth daily.   [DISCONTINUED] clonazePAM (KLONOPIN) 0.5 MG tablet Take(0.25 mg ) 1/2  tablet daily. (Patient taking differently: Take 0.25 mg by mouth 2 (two) times daily.)   [DISCONTINUED] cloNIDine (CATAPRES) 0.3 MG tablet Take 1 tablet (0.3 mg total) by mouth 2 (two) times daily. (Patient taking differently: Take 0.3 mg by mouth at bedtime.)   [DISCONTINUED] clopidogrel (PLAVIX) 75 MG tablet Take 1 tablet (75 mg total) by mouth daily.   [DISCONTINUED] enalapril (VASOTEC) 20 MG tablet Take 2 tablets (40 mg total) by mouth daily.   [DISCONTINUED] isosorbide mononitrate  (IMDUR) 30 MG 24 hr tablet Take 1 tablet (30 mg total) by mouth daily.   [DISCONTINUED] metoprolol succinate (TOPROL-XL) 100 MG 24 hr tablet Take 1 tablet (100 mg total) by mouth daily. Take with or immediately following a meal.   [DISCONTINUED] pantoprazole (PROTONIX) 40 MG tablet TAKE 1 TABLET BY MOUTH DAILY   [DISCONTINUED] rosuvastatin (CRESTOR) 40 MG tablet Take 1 tablet (40 mg total) by mouth daily.   chlorthalidone (HYGROTON) 25 MG tablet Take 1 tablet (25 mg total) by mouth daily.   clonazePAM (KLONOPIN) 0.5 MG tablet Take(0.25 mg ) 1/2  tablet daily.   cloNIDine (CATAPRES) 0.3 MG tablet Take 1 tablet (0.3 mg total) by mouth at bedtime.   clopidogrel (PLAVIX) 75 MG tablet Take 1 tablet (75 mg total) by mouth daily.   enalapril (VASOTEC) 20 MG tablet Take 2 tablets (40 mg total) by mouth daily.   escitalopram (LEXAPRO) 20 MG tablet Take 1 tablet (20 mg total) by mouth daily.   isosorbide mononitrate (IMDUR) 30 MG 24 hr tablet Take 1 tablet (30 mg total) by mouth daily.   metoprolol succinate (TOPROL-XL) 100 MG 24 hr tablet Take 1 tablet (100 mg total) by mouth daily. Take with or immediately following a meal.   pantoprazole (PROTONIX) 40 MG tablet Take 1 tablet (40 mg total) by mouth daily.   rosuvastatin (CRESTOR) 40 MG tablet Take 1 tablet (40 mg total) by mouth daily.   [DISCONTINUED] escitalopram (LEXAPRO) 20 MG tablet Take 1 tablet (20 mg  total) by mouth daily.   No facility-administered encounter medications on file as of 06/10/2022.      Medical History: Past Medical History:  Diagnosis Date   Appendicitis 09/24/2021   Bleeding stomach ulcer 2018   Brain aneurysm 2014   CAD (coronary artery disease)    Chest pain    CKD (chronic kidney disease) stage 3, GFR 30-59 ml/min (HCC)    Depression    DU (duodenal ulcer)    DVT of leg (deep venous thrombosis) ([place]) 01/16/2020   GAD (generalized anxiety disorder)    Hyperlipidemia    Hypertension    Myocardial infarction ([place])     2017 & 2019   Radiculopathy of thoracolumbar region    Stroke [facility])    2014 & 2021   SVT (supraventricular tachycardia)    Tachycardia      Vital Signs: BP 118/87   Pulse 64   Temp (!) 95.6 F (35.3 C)   Resp 16   Ht 5\' 9"  (1.753 m)   Wt 221 lb 9.6 oz (100.5 kg)   SpO2 98%   BMI 32.72 kg/m    Review of Systems  Constitutional:  Negative for chills, fatigue and unexpected weight change.  HENT:  Negative for congestion, rhinorrhea, sneezing and sore throat.   Eyes:  Positive for pain. Negative for redness and visual disturbance.  Respiratory: Negative.  Negative for cough, chest tightness, shortness of breath and wheezing.   Cardiovascular: Negative.  Negative for chest pain and palpitations.  Gastrointestinal: Negative.  Negative for abdominal pain, constipation, diarrhea, nausea and vomiting.  Genitourinary:  Negative for dysuria and frequency.  Musculoskeletal:  Positive for gait problem. Negative for arthralgias, back pain, joint swelling and neck pain.  Skin:  Negative for rash.  Neurological:  Positive for facial asymmetry, weakness and headaches. Negative for tremors and numbness.  Hematological:  Negative for adenopathy. Does not bruise/bleed easily.  Psychiatric/Behavioral:  Negative for behavioral problems (Depression), sleep disturbance and suicidal ideas. The patient is not nervous/anxious.     Physical Exam Vitals reviewed.  Constitutional:      General: He is not in acute distress.    Appearance: Normal appearance. He is obese. He is not ill-appearing.  HENT:     Head: Normocephalic and atraumatic.  Eyes:     General: No visual field deficit.    Pupils: Pupils are equal, round, and reactive to light.  Cardiovascular:     Rate and Rhythm: Normal rate and regular rhythm.  Pulmonary:     Effort: Pulmonary effort is normal. No respiratory distress.  Musculoskeletal:     Right hand: Normal strength.     Left hand: Decreased strength.  Neurological:      Mental Status: He is alert and oriented to person, place, and time.     Cranial Nerves: Dysarthria and facial asymmetry present.     Motor: Weakness present.     Gait: Gait abnormal.  Psychiatric:        Mood and Affect: Mood normal.        Behavior: Behavior normal.       Assessment/Plan: 1. Stroke-like symptoms CT head ordered to rule out new stroke and brain tumor - CT HEAD WO CONTRAST (5MM); Future  2. Left-sided weakness CT head to rule out stroke or tumor - CT HEAD WO CONTRAST (5MM); Future  3. Family history of brain cancer CT head to rule out tumor - CT HEAD WO CONTRAST (5MM); Future  4. Medication refill - chlorthalidone (HYGROTON)  25 MG tablet; Take 1 tablet (25 mg total) by mouth daily.  Dispense: 90 tablet; Refill: 1 - clonazePAM (KLONOPIN) 0.5 MG tablet; Take(0.25 mg ) 1/2  tablet daily.  Dispense: 90 tablet; Refill: 0 - cloNIDine (CATAPRES) 0.3 MG tablet; Take 1 tablet (0.3 mg total) by mouth at bedtime.  Dispense: 90 tablet; Refill: 1 - clopidogrel (PLAVIX) 75 MG tablet; Take 1 tablet (75 mg total) by mouth daily.  Dispense: 90 tablet; Refill: 1 - enalapril (VASOTEC) 20 MG tablet; Take 2 tablets (40 mg total) by mouth daily.  Dispense: 180 tablet; Refill: 1 - escitalopram (LEXAPRO) 20 MG tablet; Take 1 tablet (20 mg total) by mouth daily.  Dispense: 90 tablet; Refill: 1 - isosorbide mononitrate (IMDUR) 30 MG 24 hr tablet; Take 1 tablet (30 mg total) by mouth daily.  Dispense: 90 tablet; Refill: 1 - metoprolol succinate (TOPROL-XL) 100 MG 24 hr tablet; Take 1 tablet (100 mg total) by mouth daily. Take with or immediately following a meal.  Dispense: 90 tablet; Refill: 1 - pantoprazole (PROTONIX) 40 MG tablet; Take 1 tablet (40 mg total) by mouth daily.  Dispense: 90 tablet; Refill: 1 - rosuvastatin (CRESTOR) 40 MG tablet; Take 1 tablet (40 mg total) by mouth daily.  Dispense: 90 tablet; Refill: 1   General Counseling: Albion verbalizes understanding of the  findings of todays visit and agrees with plan of treatment. I have discussed any further diagnostic evaluation that may be needed or ordered today. We also reviewed his medications today. he has been encouraged to call the office with any questions or concerns that should arise related to todays visit.    Counseling:    Orders Placed This Encounter  Procedures   CT HEAD WO CONTRAST (5MM)    Meds ordered this encounter  Medications   chlorthalidone (HYGROTON) 25 MG tablet    Sig: Take 1 tablet (25 mg total) by mouth daily.    Dispense:  90 tablet    Refill:  1    Pt needs appt for further refills   clonazePAM (KLONOPIN) 0.5 MG tablet    Sig: Take(0.25 mg ) 1/2  tablet daily.    Dispense:  90 tablet    Refill:  0   cloNIDine (CATAPRES) 0.3 MG tablet    Sig: Take 1 tablet (0.3 mg total) by mouth at bedtime.    Dispense:  90 tablet    Refill:  1   clopidogrel (PLAVIX) 75 MG tablet    Sig: Take 1 tablet (75 mg total) by mouth daily.    Dispense:  90 tablet    Refill:  1    Pt needs appt for further refills   enalapril (VASOTEC) 20 MG tablet    Sig: Take 2 tablets (40 mg total) by mouth daily.    Dispense:  180 tablet    Refill:  1   escitalopram (LEXAPRO) 20 MG tablet    Sig: Take 1 tablet (20 mg total) by mouth daily.    Dispense:  90 tablet    Refill:  1   isosorbide mononitrate (IMDUR) 30 MG 24 hr tablet    Sig: Take 1 tablet (30 mg total) by mouth daily.    Dispense:  90 tablet    Refill:  1   metoprolol succinate (TOPROL-XL) 100 MG 24 hr tablet    Sig: Take 1 tablet (100 mg total) by mouth daily. Take with or immediately following a meal.    Dispense:  90 tablet    Refill:  1    Pt needs appt for further refills   pantoprazole (PROTONIX) 40 MG tablet    Sig: Take 1 tablet (40 mg total) by mouth daily.    Dispense:  90 tablet    Refill:  1   rosuvastatin (CRESTOR) 40 MG tablet    Sig: Take 1 tablet (40 mg total) by mouth daily.    Dispense:  90 tablet     Refill:  1    Return for F/U CT head early next week for scan and then follow up to discuss results, Lanique Gonzalo PCP.  [place] Controlled Substance Database was reviewed by me for overdose risk score (ORS)  Time spent:30 Minutes Time spent with patient included reviewing progress notes, labs, imaging studies, and discussing plan for follow up.   This patient was seen by Jonetta Osgood, FNP-C in collaboration with Dr. Clayborn Bigness as a part of collaborative care agreement.  Britania Shreeve R. Valetta Fuller, MSN, FNP-C Internal Medicine

## 2022-06-11 DIAGNOSIS — I10 Essential (primary) hypertension: Secondary | ICD-10-CM | POA: Diagnosis not present

## 2022-06-11 DIAGNOSIS — I340 Nonrheumatic mitral (valve) insufficiency: Secondary | ICD-10-CM | POA: Diagnosis not present

## 2022-06-11 DIAGNOSIS — I351 Nonrheumatic aortic (valve) insufficiency: Secondary | ICD-10-CM | POA: Diagnosis not present

## 2022-06-11 DIAGNOSIS — I2510 Atherosclerotic heart disease of native coronary artery without angina pectoris: Secondary | ICD-10-CM | POA: Diagnosis not present

## 2022-06-11 DIAGNOSIS — I6389 Other cerebral infarction: Secondary | ICD-10-CM | POA: Diagnosis not present

## 2022-06-11 DIAGNOSIS — E782 Mixed hyperlipidemia: Secondary | ICD-10-CM | POA: Diagnosis not present

## 2022-06-18 ENCOUNTER — Ambulatory Visit: Admission: RE | Admit: 2022-06-18 | Payer: Medicare Other | Source: Ambulatory Visit

## 2022-06-29 ENCOUNTER — Ambulatory Visit: Payer: Medicare Other | Admitting: Nurse Practitioner

## 2022-08-20 ENCOUNTER — Ambulatory Visit: Payer: Medicare Other | Admitting: Nurse Practitioner

## 2022-08-26 ENCOUNTER — Telehealth: Payer: Self-pay | Admitting: Nurse Practitioner

## 2022-08-26 NOTE — Telephone Encounter (Signed)
Lvm to confirm 08/31/22 appointment-Toni 

## 2022-08-31 ENCOUNTER — Ambulatory Visit: Payer: Medicare Other | Admitting: Nurse Practitioner

## 2022-09-02 ENCOUNTER — Other Ambulatory Visit: Payer: Self-pay

## 2022-09-02 ENCOUNTER — Emergency Department: Payer: Medicare Other

## 2022-09-02 ENCOUNTER — Emergency Department
Admission: EM | Admit: 2022-09-02 | Discharge: 2022-09-02 | Disposition: A | Discharge status: Home / Self Care | Payer: Medicare Other | Attending: Emergency Medicine | Admitting: Emergency Medicine

## 2022-09-02 ENCOUNTER — Encounter: Payer: Self-pay | Admitting: Emergency Medicine

## 2022-09-02 DIAGNOSIS — G4489 Other headache syndrome: Secondary | ICD-10-CM | POA: Diagnosis not present

## 2022-09-02 DIAGNOSIS — M5481 Occipital neuralgia: Secondary | ICD-10-CM | POA: Diagnosis not present

## 2022-09-02 DIAGNOSIS — Z1152 Encounter for screening for COVID-19: Secondary | ICD-10-CM | POA: Insufficient documentation

## 2022-09-02 DIAGNOSIS — I1A0 Resistant hypertension: Secondary | ICD-10-CM | POA: Insufficient documentation

## 2022-09-02 DIAGNOSIS — R519 Headache, unspecified: Secondary | ICD-10-CM | POA: Diagnosis not present

## 2022-09-02 DIAGNOSIS — Z743 Need for continuous supervision: Secondary | ICD-10-CM | POA: Diagnosis not present

## 2022-09-02 DIAGNOSIS — R6889 Other general symptoms and signs: Secondary | ICD-10-CM | POA: Diagnosis not present

## 2022-09-02 DIAGNOSIS — H579 Unspecified disorder of eye and adnexa: Secondary | ICD-10-CM | POA: Diagnosis not present

## 2022-09-02 LAB — CBC WITH DIFFERENTIAL/PLATELET
Abs Immature Granulocytes: 0.01 10*3/uL (ref 0.00–0.07)
Basophils Absolute: 0 10*3/uL (ref 0.0–0.1)
Basophils Relative: 0 %
Eosinophils Absolute: 0.2 10*3/uL (ref 0.0–0.5)
Eosinophils Relative: 3 %
HCT: 44 % (ref 39.0–52.0)
Hemoglobin: 13.8 g/dL (ref 13.0–17.0)
Immature Granulocytes: 0 %
Lymphocytes Relative: 17 %
Lymphs Abs: 0.9 10*3/uL (ref 0.7–4.0)
MCH: 28.2 pg (ref 26.0–34.0)
MCHC: 31.4 g/dL (ref 30.0–36.0)
MCV: 90 fL (ref 80.0–100.0)
Monocytes Absolute: 0.6 10*3/uL (ref 0.1–1.0)
Monocytes Relative: 12 %
Neutro Abs: 3.7 10*3/uL (ref 1.7–7.7)
Neutrophils Relative %: 68 %
Platelets: 167 10*3/uL (ref 150–400)
RBC: 4.89 MIL/uL (ref 4.22–5.81)
RDW: 14.2 % (ref 11.5–15.5)
WBC: 5.4 10*3/uL (ref 4.0–10.5)
nRBC: 0 % (ref 0.0–0.2)

## 2022-09-02 LAB — COMPREHENSIVE METABOLIC PANEL
ALT: 18 U/L (ref 0–44)
AST: 20 U/L (ref 15–41)
Albumin: 3.6 g/dL (ref 3.5–5.0)
Alkaline Phosphatase: 53 U/L (ref 38–126)
Anion gap: 6 (ref 5–15)
BUN: 24 mg/dL — ABNORMAL HIGH (ref 8–23)
CO2: 26 mmol/L (ref 22–32)
Calcium: 9.5 mg/dL (ref 8.9–10.3)
Chloride: 105 mmol/L (ref 98–111)
Creatinine, Ser: 1.17 mg/dL (ref 0.61–1.24)
GFR, Estimated: >60 mL/min (ref >60)
Glucose, Bld: 116 mg/dL — ABNORMAL HIGH (ref 70–99)
Potassium: 4 mmol/L (ref 3.5–5.1)
Sodium: 137 mmol/L (ref 135–145)
Total Bilirubin: 0.6 mg/dL (ref 0.3–1.2)
Total Protein: 6.7 g/dL (ref 6.5–8.1)

## 2022-09-02 LAB — RESP PANEL BY RT-PCR (RSV, FLU A&B, COVID)  RVPGX2
Influenza A by PCR: NEGATIVE
Influenza B by PCR: NEGATIVE
Resp Syncytial Virus by PCR: NEGATIVE
SARS Coronavirus 2 by RT PCR: NEGATIVE

## 2022-09-02 LAB — MAGNESIUM: Magnesium: 2.1 mg/dL (ref 1.7–2.4)

## 2022-09-02 MED ORDER — ACETAMINOPHEN 325 MG PO TABS
650.0000 mg | ORAL_TABLET | Freq: Once | ORAL | Status: AC
  Administered 2022-09-02: 650 mg via ORAL
  Filled 2022-09-02: qty 2

## 2022-09-02 NOTE — ED Notes (Signed)
Visual acuity left eye 20/40 and right eye 20/50

## 2022-09-02 NOTE — ED Provider Notes (Signed)
[facility] Provider Note   Event Date/Time   First MD Initiated Contact with Patient 09/02/22 [phone]     (approximate) History  Headache  HPI Howard Weaver is a 67 y.o. 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  Patient states that he has had similar symptoms when his blood pressure has been elevated in the past and states that it has been elevated throughout the last couple days.  Patient denies any nonadherence to his blood pressure medications. ROS: Patient currently denies any fevers, vision changes, tinnitus, difficulty speaking, facial droop, sore throat, chest pain, shortness of breath, abdominal pain, nausea/vomiting/diarrhea, dysuria, or weakness/numbness/paresthesias in any extremity   Physical Exam  Triage Vital Signs: ED Triage Vitals  Enc Vitals Group     BP 09/02/22 0645 (!) 163/119     Pulse Rate 09/02/22 0645 (!) 59     Resp 09/02/22 0645 18     Temp --      Temp Source 09/02/22 0645 Oral     SpO2 09/02/22 0645 98 %     Weight 09/02/22 0644 220 lb (99.8 kg)     Height 09/02/22 0644 5\' 9"  (1.753 m)     Head Circumference --      Peak Flow --      Pain Score 09/02/22 0644 7     Pain Loc --      Pain Edu? --      Excl. in [place]? --    Most recent vital signs: Vitals:   09/02/22 0645  BP: (!) 163/119  Pulse: (!) 59  Resp: 18  SpO2: 98%   General: Awake, oriented x4. CV:  Good peripheral perfusion.  Resp:  Normal effort.  Abd:  No distention.  Other:  Elderly overweight Caucasian male sitting on stretcher in no acute distress.  NIHSS 0 ED Results / Procedures / Treatments  Labs (all labs ordered are listed, but only abnormal results are displayed) Labs Reviewed  COMPREHENSIVE METABOLIC PANEL - Abnormal; Notable for the following components:      Result Value   Glucose, Bld 116 (*)    BUN 24 (*)    All other components  within normal limits  RESP PANEL BY RT-PCR (RSV, FLU A&B, COVID)  [id number]  CBC WITH DIFFERENTIAL/PLATELET  MAGNESIUM   RADIOLOGY ED MD interpretation: CT of the head without contrast interpreted by me shows no evidence of acute abnormalities including no intracerebral hemorrhage, obvious masses, or significant edema -Agree with radiology assessment Official radiology report(s): CT Head Wo Contrast  Result Date: 09/02/2022 CLINICAL DATA:  Headache of increasing frequency. History of stroke. EXAM: CT HEAD WITHOUT CONTRAST TECHNIQUE: Contiguous axial images were obtained from the base of the skull through the vertex without intravenous contrast. RADIATION DOSE REDUCTION: This exam was performed according to the departmental dose-optimization program which includes automated exposure control, adjustment of the mA and/or kV according to patient size and/or use of iterative reconstruction technique. COMPARISON:  01/15/2020 FINDINGS: Brain: There is no evidence for acute hemorrhage, hydrocephalus, mass lesion, or abnormal extra-axial fluid collection. No definite CT evidence for acute infarction. Diffuse loss of parenchymal volume is consistent with atrophy. Patchy low attenuation in the deep hemispheric and periventricular white matter is nonspecific, but likely reflects chronic microvascular ischemic demyelination. Old right frontal lobe infarct evident. Left inferior cerebellar encephalomalacia again noted with overlying craniotomy defect. Vascular: No hyperdense vessel  or unexpected calcification. Skull: No evidence for fracture. No worrisome lytic or sclerotic lesion. Sinuses/Orbits: The visualized paranasal sinuses and mastoid air cells are clear. Visualized portions of the globes and intraorbital fat are unremarkable. Other: None. IMPRESSION: 1. No acute intracranial abnormality. 2. Old right frontal lobe infarct. 3. Stable left inferior cerebellar encephalomalacia with overlying craniotomy defect. 4.  Atrophy with chronic small vessel ischemic disease. Electronically Signed   By: Misty Stanley M.D.   On: 09/02/2022 07:42   PROCEDURES: Critical Care performed: No .1-3 Lead EKG Interpretation  Performed by: Naaman Plummer, MD Authorized by: Naaman Plummer, MD     Interpretation: normal     ECG rate:  71   ECG rate assessment: normal     Rhythm: sinus rhythm     Ectopy: none     Conduction: normal    MEDICATIONS ORDERED IN ED: Medications  acetaminophen (TYLENOL) tablet 650 mg (650 mg Oral Given 09/02/22 0751)   IMPRESSION / MDM / ASSESSMENT AND PLAN / ED COURSE  I reviewed the triage vital signs and the nursing notes.                             The patient is on the cardiac monitor to evaluate for evidence of arrhythmia and/or significant heart rate changes. Patient's presentation is most consistent with acute presentation with potential threat to life or bodily function. This patient presents with a headache most consistent with benign headache from either tension type headache vs migraine. No headache red flags. Neurologic exam without evidence of meningismus, AMS, focal neurologic findings so doubt meningitis, encephalitis, stroke. Presentation not consistent with acute intracranial bleed to include SAH (lack of risk factors, headache history). No history of trauma so doubt ICH. Given history and physical temporal arteritis unlikely, as is acute angle closure glaucoma. Doubt carotid artery dissection given no focal neuro deficits, no neck trauma or recent neck strain. Patient with no signs of increased intracranial pressure or weight loss and history and physical suggest more benign headache so less likely mass effect in brain from tumor or abscess or idiopathic intracranial hypertension. Pain was controlled with headache cocktail and patient discharged home with PMD follow up. Clinical Course as of 09/02/22 0856  Thu Sep 02, 2022  0759 CT Head Wo Contrast [MF]    Clinical Course  User Index [MF] Vanessa [place], MD   FINAL CLINICAL IMPRESSION(S) / ED DIAGNOSES   Final diagnoses:  Occipital headache  Resistant hypertension   Rx / DC Orders   ED Discharge Orders     None      Note:  This document was prepared using Dragon voice recognition software and may include unintentional dictation errors.   Naaman Plummer, MD 09/02/22 [phone]

## 2022-09-02 NOTE — ED Triage Notes (Addendum)
Patient ambulatory to triage with shuffling, steady gait, without difficulty or distress noted; pt reports hx aneurysm clipped 11yrs ago; c/o left sided occipital HA for several days with left eye blurriness which isn't normal"; denies any other accomp symptoms; pt A&Ox3, MAEW, PERRL

## 2022-09-02 NOTE — Discharge Instructions (Addendum)
Please use Tylenol up to 4 g every 24 hours for any continued headaches

## 2022-09-08 ENCOUNTER — Other Ambulatory Visit: Payer: Self-pay

## 2022-09-08 ENCOUNTER — Telehealth: Payer: Self-pay

## 2022-09-08 DIAGNOSIS — Z760 Encounter for issue of repeat prescription: Secondary | ICD-10-CM

## 2022-09-08 MED ORDER — BUSPIRONE HCL 15 MG PO TABS: ORAL_TABLET | ORAL | 0 refills | Status: DC

## 2022-09-08 MED ORDER — CLONIDINE HCL 0.3 MG PO TABS: 0.3000 mg | ORAL_TABLET | Freq: Every day | ORAL | 0 refills | Status: DC

## 2022-09-08 MED ORDER — ISOSORBIDE MONONITRATE ER 30 MG PO TB24: 30.0000 mg | ORAL_TABLET | Freq: Every day | ORAL | 0 refills | Status: DC

## 2022-09-08 MED ORDER — ENALAPRIL MALEATE 20 MG PO TABS: 40.0000 mg | ORAL_TABLET | Freq: Every day | ORAL | 0 refills | Status: DC

## 2022-09-08 MED ORDER — ESCITALOPRAM OXALATE 20 MG PO TABS: 20.0000 mg | ORAL_TABLET | Freq: Every day | ORAL | 0 refills | Status: DC

## 2022-09-08 MED ORDER — CLOPIDOGREL BISULFATE 75 MG PO TABS: 75.0000 mg | ORAL_TABLET | Freq: Every day | ORAL | 0 refills | Status: DC

## 2022-09-08 MED ORDER — ROSUVASTATIN CALCIUM 40 MG PO TABS: 40.0000 mg | ORAL_TABLET | Freq: Every day | ORAL | 0 refills | Status: DC

## 2022-09-08 MED ORDER — PANTOPRAZOLE SODIUM 40 MG PO TBEC: 40.0000 mg | DELAYED_RELEASE_TABLET | Freq: Every day | ORAL | 0 refills | Status: DC

## 2022-09-08 MED ORDER — CHLORTHALIDONE 25 MG PO TABS: 25.0000 mg | ORAL_TABLET | Freq: Every day | ORAL | 0 refills | Status: DC

## 2022-09-08 NOTE — Telephone Encounter (Signed)
Pt son called that they changing phar send to [place] and also advised he need appt and gave toni to make appt

## 2022-09-24 ENCOUNTER — Ambulatory Visit (INDEPENDENT_AMBULATORY_CARE_PROVIDER_SITE_OTHER): Payer: Medicare Other | Admitting: Nurse Practitioner

## 2022-09-24 ENCOUNTER — Encounter: Payer: Self-pay | Admitting: Nurse Practitioner

## 2022-09-24 VITALS — BP 132/96 | HR 88 | Temp 98.2°F | Resp 16 | Ht 69.0 in | Wt 214.8 lb

## 2022-09-24 DIAGNOSIS — F411 Generalized anxiety disorder: Secondary | ICD-10-CM

## 2022-09-24 DIAGNOSIS — E782 Mixed hyperlipidemia: Secondary | ICD-10-CM | POA: Diagnosis not present

## 2022-09-24 DIAGNOSIS — Z0001 Encounter for general adult medical examination with abnormal findings: Secondary | ICD-10-CM | POA: Diagnosis not present

## 2022-09-24 DIAGNOSIS — Z760 Encounter for issue of repeat prescription: Secondary | ICD-10-CM

## 2022-09-24 DIAGNOSIS — Z125 Encounter for screening for malignant neoplasm of prostate: Secondary | ICD-10-CM | POA: Diagnosis not present

## 2022-09-24 MED ORDER — CLONAZEPAM 0.5 MG PO TABS: ORAL_TABLET | ORAL | 0 refills | Status: DC

## 2022-09-24 NOTE — Progress Notes (Signed)
[facility] [address] [place], [place] [address]  Internal MEDICINE  Office Visit Note  Patient Name: Howard Weaver  [id number]  [id number]  Date of Service: 09/24/2022  Chief Complaint  Patient presents with   Medicare Wellness   Hyperlipidemia   Depression   Hypertension    HPI Howard Weaver presents for an annual well visit and physical exam.  Well-appearing 67 y.o. male with hypertension, CAD, CKD, high cholesterol, GAD, low back pain, and a history of CVA.  Routine CRC screening: due in 2028 Labs: routine labs due including PSA New or worsening pain: none Other concerns: none Brother recently passed from brain cancer  Was staying in [place] for a while.  Appendectomy done 6 months ago, doing well.       09/24/2022   11:05 AM 08/28/2021   10:32 AM 08/12/2020   10:24 AM  MMSE - Mini Mental State Exam  Orientation to time 5 5 5   Orientation to Place 5 5 5   Registration 3 3 3   Attention/ Calculation 0 5 5  Recall 3 3 3   Language- name 2 objects 2 2 2   Language- repeat 1 1 1   Language- follow 3 step command 3 3 3   Language- read & follow direction 1 1 1   Write a sentence 1 1 1   Copy design 1 1 1   Total score 25 30 30     Functional Status Survey: Is the patient deaf or have difficulty hearing?: No Does the patient have difficulty seeing, even when wearing glasses/contacts?: No Does the patient have difficulty concentrating, remembering, or making decisions?: No Does the patient have difficulty walking or climbing stairs?: No Does the patient have difficulty dressing or bathing?: No Does the patient have difficulty doing errands alone such as visiting a doctor's office or shopping?: No     10/27/2021    3:06 PM 11/11/2021    9:26 AM 06/10/2022    9:58 AM 09/02/2022    6:45 AM 09/24/2022   11:04 AM  Fall Risk  Falls in the past year? 0  0  0  Was there an injury with Fall?   0  0  Fall Risk Category Calculator   0  0  Fall Risk Category  (Retired)   Low    (RETIRED) Patient Fall Risk Level  High fall risk Low fall risk Low fall risk   Patient at Risk for Falls Due to   No Fall Risks  No Fall Risks  Fall risk Follow up   Falls evaluation completed  Falls evaluation completed       09/24/2022   11:04 AM  Depression screen PHQ 2/9  Decreased Interest 0  Down, Depressed, Hopeless 0  PHQ - 2 Score 0       Current Medication: Outpatient Encounter Medications as of 09/24/2022  Medication Sig   aspirin 81 MG EC tablet Take 1 tablet (81 mg total) by mouth daily.   busPIRone (BUSPAR) 15 MG tablet Take 15 mg twice a day   chlorthalidone (HYGROTON) 25 MG tablet Take 1 tablet (25 mg total) by mouth daily.   cloNIDine (CATAPRES) 0.3 MG tablet Take 1 tablet (0.3 mg total) by mouth at bedtime.   clopidogrel (PLAVIX) 75 MG tablet Take 1 tablet (75 mg total) by mouth daily.   enalapril (VASOTEC) 20 MG tablet Take 2 tablets (40 mg total) by mouth daily.   escitalopram (LEXAPRO) 20 MG tablet Take 1 tablet (20 mg total) by mouth daily.  HYDROcodone-acetaminophen (NORCO/VICODIN) 5-325 MG tablet Take 1 tablet by mouth every 6 (six) hours as needed for moderate pain.   isosorbide mononitrate (IMDUR) 30 MG 24 hr tablet Take 1 tablet (30 mg total) by mouth daily.   metoprolol succinate (TOPROL-XL) 100 MG 24 hr tablet Take 1 tablet (100 mg total) by mouth daily. Take with or immediately following a meal.   pantoprazole (PROTONIX) 40 MG tablet Take 1 tablet (40 mg total) by mouth daily.   PFIZER COVID-19 VAC BIVALENT injection    PREVNAR 20 0.5 ML SUSY    rosuvastatin (CRESTOR) 40 MG tablet Take 1 tablet (40 mg total) by mouth daily.   [DISCONTINUED] clonazePAM (KLONOPIN) 0.5 MG tablet Take(0.25 mg ) 1/2  tablet daily.   clonazePAM (KLONOPIN) 0.5 MG tablet Take(0.25 mg ) 1/2  tablet daily.   No facility-administered encounter medications on file as of 09/24/2022.    Surgical History: Past Surgical History:  Procedure Laterality Date    CARDIAC CATHETERIZATION Right 11/25/2015   Procedure: Left Heart Cath and Coronary Angiography;  Surgeon: Dionisio David, MD;  Location: [place];  Service: Cardiovascular;  Laterality: Right;   CEREBRAL ANEURYSM REPAIR  2014   ESOPHAGOGASTRODUODENOSCOPY  04/12/2014   STOMACH SURGERY     bleeding ulcer repair   TEE WITHOUT CARDIOVERSION Right 01/17/2020   Procedure: TRANSESOPHAGEAL ECHOCARDIOGRAM (TEE);  Surgeon: Dionisio David, MD;  Location: [facility];  Service: Cardiovascular;  Laterality: Right;   XI ROBOTIC LAPAROSCOPIC ASSISTED APPENDECTOMY N/A 11/11/2021   Procedure: XI ROBOTIC LAPAROSCOPIC ASSISTED APPENDECTOMY;  Surgeon: Ronny Bacon, MD;  Location: [facility];  Service: General;  Laterality: N/A;    Medical History: Past Medical History:  Diagnosis Date   Appendicitis 09/24/2021   Bleeding stomach ulcer 2018   Brain aneurysm 2014   CAD (coronary artery disease)    Chest pain    CKD (chronic kidney disease) stage 3, GFR 30-59 ml/min (HCC)    Depression    DU (duodenal ulcer)    DVT of leg (deep venous thrombosis) ([place]) 01/16/2020   GAD (generalized anxiety disorder)    Hyperlipidemia    Hypertension    Myocardial infarction ([place])    2017 & 2019   Radiculopathy of thoracolumbar region    Stroke [facility])    2014 & 2021   SVT (supraventricular tachycardia)    Tachycardia     Family History: Family History  Problem Relation Age of Onset   Hypertension Mother    Diabetes Mother    Depression Mother    Hypertension Father     Social History   Socioeconomic History   Marital status: Legally Separated    Spouse name: Not on file   Number of children: Not on file   Years of education: Not on file   Highest education level: Not on file  Occupational History   Not on file  Tobacco Use   Smoking status: Former    Types: Cigarettes    Quit date: 11/25/1975    Years since quitting: 46.8   Smokeless tobacco: Never  Vaping Use   Vaping Use: Never used   Substance and Sexual Activity   Alcohol use: Not Currently   Drug use: No   Sexual activity: Not on file  Other Topics Concern   Not on file  Social History Narrative   Lives with sister in law   Social Determinants of Health   Financial Resource Strain: Not on file  Food Insecurity: Not on file  Transportation  Needs: Not on file  Physical Activity: Not on file  Stress: Not on file  Social Connections: Not on file  Intimate Partner Violence: Not on file      Review of Systems  Constitutional:  Negative for activity change, appetite change, chills, fatigue, fever and unexpected weight change.  HENT: Negative.  Negative for congestion, ear pain, rhinorrhea, sore throat and trouble swallowing.   Eyes: Negative.   Respiratory: Negative.  Negative for cough, chest tightness, shortness of breath and wheezing.   Cardiovascular: Negative.  Negative for chest pain.  Gastrointestinal: Negative.  Negative for abdominal pain, blood in stool, constipation, diarrhea, nausea and vomiting.  Endocrine: Negative.   Genitourinary: Negative.  Negative for difficulty urinating, dysuria, frequency, hematuria and urgency.  Musculoskeletal: Negative.  Negative for arthralgias, back pain, joint swelling, myalgias and neck pain.  Skin: Negative.  Negative for rash and wound.  Allergic/Immunologic: Negative.  Negative for immunocompromised state.  Neurological: Negative.  Negative for dizziness, seizures, numbness and headaches.  Hematological: Negative.   Psychiatric/Behavioral: Negative.  Negative for behavioral problems, self-injury and suicidal ideas. The patient is not nervous/anxious.     Vital Signs: BP (!) 132/96   Pulse 88   Temp 98.2 F (36.8 C)   Resp 16   Ht 5\' 9"  (1.753 m)   Wt 214 lb 12.8 oz (97.4 kg)   SpO2 98%   BMI 31.72 kg/m    Physical Exam Constitutional:      General: He is not in acute distress.    Appearance: Normal appearance. He is well-developed. He is obese.  He is not ill-appearing or diaphoretic.  HENT:     Head: Normocephalic and atraumatic.     Right Ear: Tympanic membrane, ear canal and external ear normal.     Left Ear: Tympanic membrane, ear canal and external ear normal.     Nose: Nose normal. No congestion or rhinorrhea.     Mouth/Throat:     Mouth: Mucous membranes are moist.     Dentition: Abnormal dentition. Gingival swelling and dental caries present.     Pharynx: Oropharynx is clear. No oropharyngeal exudate or posterior oropharyngeal erythema.     Comments: He is missing several teeth and the remaining teeth are decayed with multiple dental caries.  Eyes:     General: Lids are normal. Vision grossly intact. Gaze aligned appropriately. No scleral icterus.       Right eye: No discharge.        Left eye: No discharge.     Extraocular Movements: Extraocular movements intact.     Conjunctiva/sclera: Conjunctivae normal.     Pupils: Pupils are equal, round, and reactive to light.     Funduscopic exam:    Right eye: Red reflex present.        Left eye: Red reflex present. Neck:     Thyroid: No thyromegaly.     Vascular: No JVD.     Trachea: Trachea and phonation normal. No tracheal deviation.  Cardiovascular:     Rate and Rhythm: Normal rate and regular rhythm.     Pulses: Normal pulses.     Heart sounds: Normal heart sounds, S1 normal and S2 normal. No murmur heard.    No friction rub. No gallop.  Pulmonary:     Effort: Pulmonary effort is normal. No accessory muscle usage or respiratory distress.     Breath sounds: Normal breath sounds and air entry. No stridor. No wheezing or rales.  Chest:     Chest  wall: No tenderness.  Abdominal:     General: Bowel sounds are normal. There is no distension.     Palpations: Abdomen is soft. There is no shifting dullness, fluid wave, mass or pulsatile mass.     Tenderness: There is no abdominal tenderness. There is no guarding or rebound.  Musculoskeletal:        General: No tenderness  or deformity. Normal range of motion.     Cervical back: Normal range of motion and neck supple.     Right lower leg: No edema.     Left lower leg: No edema.  Lymphadenopathy:     Cervical: No cervical adenopathy.  Skin:    General: Skin is warm and dry.     Capillary Refill: Capillary refill takes less than 2 seconds.     Coloration: Skin is not pale.     Findings: No erythema or rash.  Neurological:     Mental Status: He is alert and oriented to person, place, and time.     Cranial Nerves: No cranial nerve deficit.     Motor: No abnormal muscle tone.     Coordination: Coordination normal.     Gait: Gait normal.     Deep Tendon Reflexes: Reflexes are normal and symmetric.  Psychiatric:        Mood and Affect: Mood normal.        Behavior: Behavior normal.        Thought Content: Thought content normal.        Judgment: Judgment normal.        Assessment/Plan: 1. Encounter for routine adult health examination with abnormal findings Age-appropriate preventive screenings and vaccinations discussed, annual physical exam completed. Routine labs for health maintenance ordered, see below. PHM updated.  - Lipid Profile  2. Screening for prostate cancer Routine PSA lab ordered - PSA Total (Reflex To Free)  3. Mixed hyperlipidemia Routine lipid panel ordered - Lipid Profile  4. GAD (generalized anxiety disorder) Continue clonazepam as prescribed. Refill ordered. Follow up in 3 months for additional refill.  - clonazePAM (KLONOPIN) 0.5 MG tablet; Take(0.25 mg ) 1/2  tablet daily.  Dispense: 90 tablet; Refill: 0      General Counseling: Ngoc verbalizes understanding of the findings of todays visit and agrees with plan of treatment. I have discussed any further diagnostic evaluation that may be needed or ordered today. We also reviewed his medications today. he has been encouraged to call the office with any questions or concerns that should arise related to todays  visit.    Orders Placed This Encounter  Procedures   PSA Total (Reflex To Free)   Lipid Profile    Meds ordered this encounter  Medications   clonazePAM (KLONOPIN) 0.5 MG tablet    Sig: Take(0.25 mg ) 1/2  tablet daily.    Dispense:  90 tablet    Refill:  0    Return in about 3 months (around 12/15/2022) for F/U, anxiety med refill, Kieara Schwark PCP.   Total time spent:30 Minutes Time spent includes review of chart, medications, test results, and follow up plan with the patient.   [place] Controlled Substance Database was reviewed by me.  This patient was seen by Jonetta Osgood, FNP-C in collaboration with Dr. Clayborn Bigness as a part of collaborative care agreement.  Nakota Elsen R. Valetta Fuller, MSN, FNP-C Internal medicine

## 2022-09-25 ENCOUNTER — Encounter: Payer: Self-pay | Admitting: Nurse Practitioner

## 2022-12-09 ENCOUNTER — Other Ambulatory Visit: Payer: Self-pay | Admitting: Nurse Practitioner

## 2022-12-09 DIAGNOSIS — Z760 Encounter for issue of repeat prescription: Secondary | ICD-10-CM

## 2022-12-22 ENCOUNTER — Ambulatory Visit: Payer: Medicare Other | Admitting: Nurse Practitioner

## 2022-12-23 ENCOUNTER — Telehealth: Payer: Self-pay | Admitting: Nurse Practitioner

## 2022-12-23 NOTE — Telephone Encounter (Signed)
Unable to lvm to pt

## 2023-02-02 ENCOUNTER — Other Ambulatory Visit: Payer: Self-pay

## 2023-02-02 DIAGNOSIS — Z760 Encounter for issue of repeat prescription: Secondary | ICD-10-CM

## 2023-02-02 MED ORDER — PANTOPRAZOLE SODIUM 40 MG PO TBEC: 40.0000 mg | DELAYED_RELEASE_TABLET | Freq: Every day | ORAL | 0 refills | Status: DC

## 2023-02-02 MED ORDER — CLONIDINE HCL 0.3 MG PO TABS: 0.3000 mg | ORAL_TABLET | Freq: Every day | ORAL | 0 refills | Status: DC

## 2023-02-07 ENCOUNTER — Encounter: Payer: Self-pay | Admitting: Nurse Practitioner

## 2023-02-07 ENCOUNTER — Ambulatory Visit (INDEPENDENT_AMBULATORY_CARE_PROVIDER_SITE_OTHER): Payer: Medicare Other | Admitting: Nurse Practitioner

## 2023-02-07 VITALS — BP 130/76 | HR 62 | Temp 98.1°F | Resp 16 | Ht 69.0 in | Wt 210.6 lb

## 2023-02-07 DIAGNOSIS — Z23 Encounter for immunization: Secondary | ICD-10-CM

## 2023-02-07 DIAGNOSIS — E782 Mixed hyperlipidemia: Secondary | ICD-10-CM

## 2023-02-07 DIAGNOSIS — Z79899 Other long term (current) drug therapy: Secondary | ICD-10-CM

## 2023-02-07 DIAGNOSIS — F411 Generalized anxiety disorder: Secondary | ICD-10-CM

## 2023-02-07 DIAGNOSIS — I10 Essential (primary) hypertension: Secondary | ICD-10-CM

## 2023-02-07 MED ORDER — PANTOPRAZOLE SODIUM 40 MG PO TBEC: 40.0000 mg | DELAYED_RELEASE_TABLET | Freq: Every day | ORAL | 1 refills | Status: DC

## 2023-02-07 MED ORDER — ROSUVASTATIN CALCIUM 40 MG PO TABS: 40.0000 mg | ORAL_TABLET | Freq: Every day | ORAL | 1 refills | Status: DC

## 2023-02-07 MED ORDER — HYDROCODONE-ACETAMINOPHEN 5-325 MG PO TABS: 1.0000 | ORAL_TABLET | Freq: Four times a day (QID) | ORAL | 0 refills | Status: DC | PRN

## 2023-02-07 MED ORDER — BUSPIRONE HCL 15 MG PO TABS: ORAL_TABLET | ORAL | 1 refills | Status: DC

## 2023-02-07 MED ORDER — METOPROLOL SUCCINATE ER 100 MG PO TB24: 100.0000 mg | ORAL_TABLET | Freq: Every day | ORAL | 1 refills | Status: DC

## 2023-02-07 MED ORDER — CLONIDINE HCL 0.3 MG PO TABS: 0.3000 mg | ORAL_TABLET | Freq: Every day | ORAL | 1 refills | Status: DC

## 2023-02-07 MED ORDER — ESCITALOPRAM OXALATE 20 MG PO TABS: 20.0000 mg | ORAL_TABLET | Freq: Every day | ORAL | 1 refills | Status: DC

## 2023-02-07 MED ORDER — CHLORTHALIDONE 25 MG PO TABS: 25.0000 mg | ORAL_TABLET | Freq: Every day | ORAL | 1 refills | Status: DC

## 2023-02-07 MED ORDER — ENALAPRIL MALEATE 20 MG PO TABS: 40.0000 mg | ORAL_TABLET | Freq: Every day | ORAL | 1 refills | Status: DC

## 2023-02-07 MED ORDER — ASPIRIN 81 MG PO TBEC: 81.0000 mg | DELAYED_RELEASE_TABLET | Freq: Every day | ORAL | 1 refills | Status: DC

## 2023-02-07 MED ORDER — CLOPIDOGREL BISULFATE 75 MG PO TABS: 75.0000 mg | ORAL_TABLET | Freq: Every day | ORAL | 1 refills | Status: DC

## 2023-02-07 MED ORDER — ZOSTER VAC RECOMB ADJUVANTED 50 MCG/0.5ML IM SUSR: 0.5000 mL | Freq: Once | INTRAMUSCULAR | 0 refills | Status: AC

## 2023-02-07 MED ORDER — CLONAZEPAM 0.5 MG PO TABS: ORAL_TABLET | ORAL | 0 refills | Status: DC

## 2023-02-07 MED ORDER — ISOSORBIDE MONONITRATE ER 30 MG PO TB24: 30.0000 mg | ORAL_TABLET | Freq: Every day | ORAL | 1 refills | Status: DC

## 2023-02-07 NOTE — Progress Notes (Signed)
[facility] [address] [place], [place] [address]  Internal MEDICINE  Office Visit Note  Patient Name: Howard Weaver  [id number]  [id number]  Date of Service: 02/07/2023  Chief Complaint  Patient presents with   Depression   Hypertension   Hyperlipidemia   Follow-up    HPI Zailyn presents for a follow-up visit for hypertension, high cholesterol, and anxiety Hypertension -- controlled on current medications -- chlorthalidone, enalapril and metoprolol. Anxiety -- taking clonazepam as needed Hyperlipidemia -- takes rosuvastatin Due for shingles vaccine Medication list reviewed and updated.     Current Medication: Outpatient Encounter Medications as of 02/07/2023  Medication Sig   [DISCONTINUED] aspirin 81 MG EC tablet Take 1 tablet (81 mg total) by mouth daily.   [DISCONTINUED] busPIRone (BUSPAR) 15 MG tablet Take 15 mg twice a day   [DISCONTINUED] chlorthalidone (HYGROTON) 25 MG tablet Take 1 tablet (25 mg total) by mouth daily.   [DISCONTINUED] clonazePAM (KLONOPIN) 0.5 MG tablet Take(0.25 mg ) 1/2  tablet daily.   [DISCONTINUED] cloNIDine (CATAPRES) 0.3 MG tablet Take 1 tablet (0.3 mg total) by mouth at bedtime.   [DISCONTINUED] clopidogrel (PLAVIX) 75 MG tablet Take 1 tablet (75 mg total) by mouth daily.   [DISCONTINUED] enalapril (VASOTEC) 20 MG tablet Take 2 tablets by mouth once daily   [DISCONTINUED] escitalopram (LEXAPRO) 20 MG tablet Take 1 tablet (20 mg total) by mouth daily.   [DISCONTINUED] HYDROcodone-acetaminophen (NORCO/VICODIN) 5-325 MG tablet Take 1 tablet by mouth every 6 (six) hours as needed for moderate pain.   [DISCONTINUED] isosorbide mononitrate (IMDUR) 30 MG 24 hr tablet Take 1 tablet (30 mg total) by mouth daily.   [DISCONTINUED] metoprolol succinate (TOPROL-XL) 100 MG 24 hr tablet Take 1 tablet (100 mg total) by mouth daily. Take with or immediately following a meal.   [DISCONTINUED] pantoprazole (PROTONIX) 40 MG tablet Take 1  tablet (40 mg total) by mouth daily.   [DISCONTINUED] PFIZER COVID-19 VAC BIVALENT injection    [DISCONTINUED] PREVNAR 20 0.5 ML SUSY    [DISCONTINUED] rosuvastatin (CRESTOR) 40 MG tablet Take 1 tablet by mouth once daily   [DISCONTINUED] Zoster Vaccine Adjuvanted [facility]) injection Inject 0.5 mLs into the muscle once.   aspirin EC 81 MG tablet Take 1 tablet (81 mg total) by mouth daily.   busPIRone (BUSPAR) 15 MG tablet Take 15 mg twice a day   chlorthalidone (HYGROTON) 25 MG tablet Take 1 tablet (25 mg total) by mouth daily.   clonazePAM (KLONOPIN) 0.5 MG tablet Take(0.25 mg ) 1/2  tablet daily.   cloNIDine (CATAPRES) 0.3 MG tablet Take 1 tablet (0.3 mg total) by mouth at bedtime.   clopidogrel (PLAVIX) 75 MG tablet Take 1 tablet (75 mg total) by mouth daily.   enalapril (VASOTEC) 20 MG tablet Take 2 tablets (40 mg total) by mouth daily.   escitalopram (LEXAPRO) 20 MG tablet Take 1 tablet (20 mg total) by mouth daily.   HYDROcodone-acetaminophen (NORCO/VICODIN) 5-325 MG tablet Take 1 tablet by mouth every 6 (six) hours as needed for moderate pain.   isosorbide mononitrate (IMDUR) 30 MG 24 hr tablet Take 1 tablet (30 mg total) by mouth daily.   metoprolol succinate (TOPROL-XL) 100 MG 24 hr tablet Take 1 tablet (100 mg total) by mouth daily. Take with or immediately following a meal.   pantoprazole (PROTONIX) 40 MG tablet Take 1 tablet (40 mg total) by mouth daily.   rosuvastatin (CRESTOR) 40 MG tablet Take 1 tablet (40 mg total) by mouth daily.  Zoster Vaccine Adjuvanted [facility]) injection Inject 0.5 mLs into the muscle once for 1 dose.   No facility-administered encounter medications on file as of 02/07/2023.    Surgical History: Past Surgical History:  Procedure Laterality Date   CARDIAC CATHETERIZATION Right 11/25/2015   Procedure: Left Heart Cath and Coronary Angiography;  Surgeon: Laurier Nancy, MD;  Location: [facility] INVASIVE CV LAB;  Service: Cardiovascular;  Laterality: Right;    CEREBRAL ANEURYSM REPAIR  2014   ESOPHAGOGASTRODUODENOSCOPY  04/12/2014   STOMACH SURGERY     bleeding ulcer repair   TEE WITHOUT CARDIOVERSION Right 01/17/2020   Procedure: TRANSESOPHAGEAL ECHOCARDIOGRAM (TEE);  Surgeon: Laurier Nancy, MD;  Location: [facility] ORS;  Service: Cardiovascular;  Laterality: Right;   XI ROBOTIC LAPAROSCOPIC ASSISTED APPENDECTOMY N/A 11/11/2021   Procedure: XI ROBOTIC LAPAROSCOPIC ASSISTED APPENDECTOMY;  Surgeon: [name], MD;  Location: [facility] ORS;  Service: General;  Laterality: N/A;    Medical History: Past Medical History:  Diagnosis Date   Appendicitis 09/24/2021   Bleeding stomach ulcer 2018   Brain aneurysm 2014   CAD (coronary artery disease)    Chest pain    CKD (chronic kidney disease) stage 3, GFR 30-59 ml/min (HCC)    Depression    DU (duodenal ulcer)    DVT of leg (deep venous thrombosis) (HCC) 01/16/2020   GAD (generalized anxiety disorder)    Hyperlipidemia    Hypertension    Myocardial infarction (HCC)    2017 & 2019   Radiculopathy of thoracolumbar region    Stroke [facility])    2014 & 2021   SVT (supraventricular tachycardia)    Tachycardia     Family History: Family History  Problem Relation Age of Onset   Hypertension Mother    Diabetes Mother    Depression Mother    Hypertension Father     Social History   Socioeconomic History   Marital status: Legally Separated    Spouse name: Not on file   Number of children: Not on file   Years of education: Not on file   Highest education level: Not on file  Occupational History   Not on file  Tobacco Use   Smoking status: Former    Types: Cigarettes    Quit date: 11/25/1975    Years since quitting: 47.2   Smokeless tobacco: Never  Vaping Use   Vaping Use: Never used  Substance and Sexual Activity   Alcohol use: Not Currently   Drug use: No   Sexual activity: Not on file  Other Topics Concern   Not on file  Social History Narrative   Lives with sister in law    Social Determinants of Health   Financial Resource Strain: Not on file  Food Insecurity: Not on file  Transportation Needs: Not on file  Physical Activity: Not on file  Stress: Not on file  Social Connections: Not on file  Intimate Partner Violence: Not on file      Review of Systems  Constitutional:  Negative for chills, fatigue and unexpected weight change.  HENT:  Negative for congestion, rhinorrhea, sneezing and sore throat.   Eyes:  Negative for pain, redness and visual disturbance.  Respiratory: Negative.  Negative for cough, chest tightness, shortness of breath and wheezing.   Cardiovascular: Negative.  Negative for chest pain and palpitations.  Gastrointestinal: Negative.  Negative for abdominal pain, constipation, diarrhea, nausea and vomiting.  Genitourinary:  Negative for dysuria and frequency.  Musculoskeletal:  Positive for gait problem. Negative for arthralgias, back  pain, joint swelling and neck pain.  Skin:  Negative for rash.  Neurological:  Negative for tremors, facial asymmetry, weakness, numbness and headaches.  Hematological:  Negative for adenopathy. Does not bruise/bleed easily.  Psychiatric/Behavioral:  Negative for behavioral problems (Depression), sleep disturbance and suicidal ideas. The patient is not nervous/anxious.     Vital Signs: BP 130/76   Pulse 62   Temp 98.1 F (36.7 C)   Resp 16   Ht 5\' 9"  (1.753 m)   Wt 210 lb 9.6 oz (95.5 kg)   SpO2 97%   BMI 31.10 kg/m    Physical Exam Vitals reviewed.  Constitutional:      General: He is not in acute distress.    Appearance: Normal appearance. He is obese. He is not ill-appearing.  HENT:     Head: Normocephalic and atraumatic.  Eyes:     Pupils: Pupils are equal, round, and reactive to light.  Cardiovascular:     Rate and Rhythm: Normal rate and regular rhythm.  Pulmonary:     Effort: Pulmonary effort is normal. No respiratory distress.  Neurological:     Mental Status: He is alert  and oriented to person, place, and time.  Psychiatric:        Mood and Affect: Mood normal.        Behavior: Behavior normal.        Assessment/Plan: 1. Essential hypertension Stable, continue chlorthalidone, clonidine, enalapril and metoprolol as prescribed.  - chlorthalidone (HYGROTON) 25 MG tablet; Take 1 tablet (25 mg total) by mouth daily.  Dispense: 90 tablet; Refill: 1 - cloNIDine (CATAPRES) 0.3 MG tablet; Take 1 tablet (0.3 mg total) by mouth at bedtime.  Dispense: 90 tablet; Refill: 1 - enalapril (VASOTEC) 20 MG tablet; Take 2 tablets (40 mg total) by mouth daily.  Dispense: 180 tablet; Refill: 1 - metoprolol succinate (TOPROL-XL) 100 MG 24 hr tablet; Take 1 tablet (100 mg total) by mouth daily. Take with or immediately following a meal.  Dispense: 90 tablet; Refill: 1  2. Mixed hyperlipidemia Continue rosuvastatin as prescribed - rosuvastatin (CRESTOR) 40 MG tablet; Take 1 tablet (40 mg total) by mouth daily.  Dispense: 90 tablet; Refill: 1  3. GAD (generalized anxiety disorder) Continue buspirone as prescribed.  - busPIRone (BUSPAR) 15 MG tablet; Take 15 mg twice a day  Dispense: 180 tablet; Refill: 1 - clonazePAM (KLONOPIN) 0.5 MG tablet; Take(0.25 mg ) 1/2  tablet daily.  Dispense: 90 tablet; Refill: 0  4. Need for vaccination - Zoster Vaccine Adjuvanted [facility]) injection; Inject 0.5 mLs into the muscle once for 1 dose.  Dispense: 0.5 mL; Refill: 0  5. Encounter for medication review Medication list reviewed, updated and refills ordered.  - aspirin EC 81 MG tablet; Take 1 tablet (81 mg total) by mouth daily.  Dispense: 90 tablet; Refill: 1 - clopidogrel (PLAVIX) 75 MG tablet; Take 1 tablet (75 mg total) by mouth daily.  Dispense: 90 tablet; Refill: 1 - escitalopram (LEXAPRO) 20 MG tablet; Take 1 tablet (20 mg total) by mouth daily.  Dispense: 90 tablet; Refill: 1 - pantoprazole (PROTONIX) 40 MG tablet; Take 1 tablet (40 mg total) by mouth daily.  Dispense: 90  tablet; Refill: 1 - isosorbide mononitrate (IMDUR) 30 MG 24 hr tablet; Take 1 tablet (30 mg total) by mouth daily.  Dispense: 90 tablet; Refill: 1 - HYDROcodone-acetaminophen (NORCO/VICODIN) 5-325 MG tablet; Take 1 tablet by mouth every 6 (six) hours as needed for moderate pain.  Dispense: 20 tablet; Refill: 0  General Counseling: Nikki verbalizes understanding of the findings of todays visit and agrees with plan of treatment. I have discussed any further diagnostic evaluation that may be needed or ordered today. We also reviewed his medications today. he has been encouraged to call the office with any questions or concerns that should arise related to todays visit.    No orders of the defined types were placed in this encounter.   Meds ordered this encounter  Medications   Zoster Vaccine Adjuvanted [facility]) injection    Sig: Inject 0.5 mLs into the muscle once for 1 dose.    Dispense:  0.5 mL    Refill:  0   chlorthalidone (HYGROTON) 25 MG tablet    Sig: Take 1 tablet (25 mg total) by mouth daily.    Dispense:  90 tablet    Refill:  1    Pt needs appt for further refills   busPIRone (BUSPAR) 15 MG tablet    Sig: Take 15 mg twice a day    Dispense:  180 tablet    Refill:  1   aspirin EC 81 MG tablet    Sig: Take 1 tablet (81 mg total) by mouth daily.    Dispense:  90 tablet    Refill:  1   cloNIDine (CATAPRES) 0.3 MG tablet    Sig: Take 1 tablet (0.3 mg total) by mouth at bedtime.    Dispense:  90 tablet    Refill:  1   clopidogrel (PLAVIX) 75 MG tablet    Sig: Take 1 tablet (75 mg total) by mouth daily.    Dispense:  90 tablet    Refill:  1    Pt needs appt for further refills   enalapril (VASOTEC) 20 MG tablet    Sig: Take 2 tablets (40 mg total) by mouth daily.    Dispense:  180 tablet    Refill:  1   escitalopram (LEXAPRO) 20 MG tablet    Sig: Take 1 tablet (20 mg total) by mouth daily.    Dispense:  90 tablet    Refill:  1   rosuvastatin (CRESTOR) 40 MG  tablet    Sig: Take 1 tablet (40 mg total) by mouth daily.    Dispense:  90 tablet    Refill:  1   pantoprazole (PROTONIX) 40 MG tablet    Sig: Take 1 tablet (40 mg total) by mouth daily.    Dispense:  90 tablet    Refill:  1   metoprolol succinate (TOPROL-XL) 100 MG 24 hr tablet    Sig: Take 1 tablet (100 mg total) by mouth daily. Take with or immediately following a meal.    Dispense:  90 tablet    Refill:  1    Pt needs appt for further refills   isosorbide mononitrate (IMDUR) 30 MG 24 hr tablet    Sig: Take 1 tablet (30 mg total) by mouth daily.    Dispense:  90 tablet    Refill:  1   HYDROcodone-acetaminophen (NORCO/VICODIN) 5-325 MG tablet    Sig: Take 1 tablet by mouth every 6 (six) hours as needed for moderate pain.    Dispense:  20 tablet    Refill:  0    For right wrist pain   clonazePAM (KLONOPIN) 0.5 MG tablet    Sig: Take(0.25 mg ) 1/2  tablet daily.    Dispense:  90 tablet    Refill:  0    Return in about 3 months (around  05/03/2023) for F/U, anxiety med refill, Jovanka Westgate PCP.   Total time spent:30 Minutes Time spent includes review of chart, medications, test results, and follow up plan with the patient.   [place] Controlled Substance Database was reviewed by me.  This patient was seen by Sallyanne Kuster, FNP-C in collaboration with Dr. Beverely Risen as a part of collaborative care agreement.   Collie Wernick R. Tedd Sias, MSN, FNP-C Internal medicine

## 2023-03-02 ENCOUNTER — Emergency Department
Admission: EM | Admit: 2023-03-02 | Discharge: 2023-03-02 | Disposition: A | Discharge status: Home / Self Care | Payer: Medicare Other | Attending: Emergency Medicine | Admitting: Emergency Medicine

## 2023-03-02 ENCOUNTER — Other Ambulatory Visit: Payer: Self-pay

## 2023-03-02 DIAGNOSIS — R42 Dizziness and giddiness: Secondary | ICD-10-CM | POA: Diagnosis not present

## 2023-03-02 DIAGNOSIS — I2510 Atherosclerotic heart disease of native coronary artery without angina pectoris: Secondary | ICD-10-CM | POA: Diagnosis not present

## 2023-03-02 DIAGNOSIS — W19XXXA Unspecified fall, initial encounter: Secondary | ICD-10-CM | POA: Diagnosis not present

## 2023-03-02 DIAGNOSIS — R6889 Other general symptoms and signs: Secondary | ICD-10-CM | POA: Diagnosis not present

## 2023-03-02 DIAGNOSIS — I129 Hypertensive chronic kidney disease with stage 1 through stage 4 chronic kidney disease, or unspecified chronic kidney disease: Secondary | ICD-10-CM | POA: Diagnosis not present

## 2023-03-02 DIAGNOSIS — R55 Syncope and collapse: Secondary | ICD-10-CM | POA: Insufficient documentation

## 2023-03-02 DIAGNOSIS — I10 Essential (primary) hypertension: Secondary | ICD-10-CM | POA: Diagnosis not present

## 2023-03-02 DIAGNOSIS — R404 Transient alteration of awareness: Secondary | ICD-10-CM | POA: Diagnosis not present

## 2023-03-02 DIAGNOSIS — Z8673 Personal history of transient ischemic attack (TIA), and cerebral infarction without residual deficits: Secondary | ICD-10-CM | POA: Diagnosis not present

## 2023-03-02 DIAGNOSIS — N189 Chronic kidney disease, unspecified: Secondary | ICD-10-CM | POA: Diagnosis not present

## 2023-03-02 LAB — BASIC METABOLIC PANEL
Anion gap: 13 (ref 5–15)
BUN: 23 mg/dL (ref 8–23)
CO2: 18 mmol/L — ABNORMAL LOW (ref 22–32)
Calcium: 9.7 mg/dL (ref 8.9–10.3)
Chloride: 105 mmol/L (ref 98–111)
Creatinine, Ser: 1.18 mg/dL (ref 0.61–1.24)
GFR, Estimated: >60 mL/min (ref >60)
Glucose, Bld: 132 mg/dL — ABNORMAL HIGH (ref 70–99)
Potassium: 3.8 mmol/L (ref 3.5–5.1)
Sodium: 136 mmol/L (ref 135–145)

## 2023-03-02 LAB — CBC
HCT: 47.5 % (ref 39.0–52.0)
Hemoglobin: 16 g/dL (ref 13.0–17.0)
MCH: 29.5 pg (ref 26.0–34.0)
MCHC: 33.7 g/dL (ref 30.0–36.0)
MCV: 87.5 fL (ref 80.0–100.0)
Platelets: 210 10*3/uL (ref 150–400)
RBC: 5.43 MIL/uL (ref 4.22–5.81)
RDW: 13 % (ref 11.5–15.5)
WBC: 8.3 10*3/uL (ref 4.0–10.5)
nRBC: 0 % (ref 0.0–0.2)

## 2023-03-02 LAB — CBG MONITORING, ED: Glucose-Capillary: 110 mg/dL — ABNORMAL HIGH (ref 70–99)

## 2023-03-02 LAB — TROPONIN I (HIGH SENSITIVITY)
Troponin I (High Sensitivity): 16 ng/L (ref <18)
Troponin I (High Sensitivity): 23 ng/L — ABNORMAL HIGH (ref <18)

## 2023-03-02 MED ORDER — CLONIDINE HCL 0.1 MG PO TABS
0.3000 mg | ORAL_TABLET | Freq: Once | ORAL | Status: AC
  Administered 2023-03-02: 0.3 mg via ORAL
  Filled 2023-03-02: qty 3

## 2023-03-02 MED ORDER — ISOSORBIDE MONONITRATE ER 60 MG PO TB24
30.0000 mg | ORAL_TABLET | Freq: Once | ORAL | Status: AC
  Administered 2023-03-02: 30 mg via ORAL
  Filled 2023-03-02: qty 1

## 2023-03-02 MED ORDER — METOPROLOL SUCCINATE ER 50 MG PO TB24
100.0000 mg | ORAL_TABLET | Freq: Once | ORAL | Status: AC
  Administered 2023-03-02: 100 mg via ORAL
  Filled 2023-03-02: qty 2

## 2023-03-02 MED ORDER — ROSUVASTATIN CALCIUM 20 MG PO TABS
40.0000 mg | ORAL_TABLET | Freq: Once | ORAL | Status: AC
  Administered 2023-03-02: 40 mg via ORAL
  Filled 2023-03-02 (×2): qty 2

## 2023-03-02 MED ORDER — SODIUM CHLORIDE 0.9 % IV BOLUS
500.0000 mL | Freq: Once | INTRAVENOUS | Status: AC
  Administered 2023-03-02: 500 mL via INTRAVENOUS

## 2023-03-02 MED ORDER — ENALAPRIL MALEATE 10 MG PO TABS
40.0000 mg | ORAL_TABLET | Freq: Once | ORAL | Status: AC
  Administered 2023-03-02: 40 mg via ORAL
  Filled 2023-03-02: qty 4

## 2023-03-02 MED ORDER — ASPIRIN 81 MG PO TBEC
81.0000 mg | DELAYED_RELEASE_TABLET | Freq: Once | ORAL | Status: AC
  Administered 2023-03-02: 81 mg via ORAL
  Filled 2023-03-02: qty 1

## 2023-03-02 MED ORDER — CLOPIDOGREL BISULFATE 75 MG PO TABS
75.0000 mg | ORAL_TABLET | Freq: Once | ORAL | Status: AC
  Administered 2023-03-02: 75 mg via ORAL
  Filled 2023-03-02: qty 1

## 2023-03-02 NOTE — ED Notes (Signed)
Pt in bed, pt denies pain, pt states that he is ready to go home, pt states that he has no pain at this time and his son is going to come get him. Pt verbalized understanding d/c and follow up, pt from department.

## 2023-03-02 NOTE — ED Provider Notes (Signed)
[facility] Provider Note    Event Date/Time   First MD Initiated Contact with Patient 03/02/23 1044     (approximate)   History   Loss of Consciousness   HPI  Howard Weaver is a 67 y.o. male with a history of CAD, MI, hypertension, CKD, and stroke who presents with an episode of near syncope while walking.  The patient states that he did not eat breakfast and did not take any of his morning medications.  He went to [facility] to do some shopping and that he was walking several miles back when he started to feel lightheaded and dizzy.  He then states he felt like he almost passed out but did not completely lose consciousness.  He denies any associated chest pain or difficulty breathing.  He had no nausea or vomiting.  The patient did not feel particularly hot or sweaty.  He states he is feeling significantly better now.  I reviewed the past medical records.  The patient was most recently evaluated by neurology in 2022 due to history of stroke and episodes of dizziness.  His most recent outpatient encounter was with internal medicine on 6/10 for follow-up of his hypertension and other chronic conditions.   Physical Exam   Triage Vital Signs: ED Triage Vitals  Enc Vitals Group     BP 03/02/23 1046 (!) 204/140     Pulse Rate 03/02/23 1045 100     Resp 03/02/23 1045 20     Temp 03/02/23 1045 98 F (36.7 C)     Temp src --      SpO2 03/02/23 1045 100 %     Weight --      Height --      Head Circumference --      Peak Flow --      Pain Score 03/02/23 1041 4     Pain Loc --      Pain Edu? --      Excl. in GC? --     Most recent vital signs: Vitals:   03/02/23 1301 03/02/23 1355  BP:  105/76  Pulse:  74  Resp: 19 18  Temp:  97.6 F (36.4 C)  SpO2:  94%     General: Alert, well-appearing, no distress.  CV:  Good peripheral perfusion.  Normal heart sounds. Resp:  Normal effort.  Abd:  No distention.  Other:  EOMI.  PERRLA.  No facial droop.   Motor intact in all extremities.  Normal coordination and speech.  Somewhat dry mucous membranes.   ED Results / Procedures / Treatments   Labs (all labs ordered are listed, but only abnormal results are displayed) Labs Reviewed  BASIC METABOLIC PANEL - Abnormal; Notable for the following components:      Result Value   CO2 18 (*)    Glucose, Bld 132 (*)    All other components within normal limits  CBG MONITORING, ED - Abnormal; Notable for the following components:   Glucose-Capillary 110 (*)    All other components within normal limits  TROPONIN I (HIGH SENSITIVITY) - Abnormal; Notable for the following components:   Troponin I (High Sensitivity) 23 (*)    All other components within normal limits  CBC  URINALYSIS, ROUTINE W REFLEX MICROSCOPIC  TROPONIN I (HIGH SENSITIVITY)     EKG  ED ECG REPORT I, Dionne Bucy, the attending physician, personally viewed and interpreted this ECG.  Date: 03/02/2023 EKG Time: 1047 Rate: 89 Rhythm: normal sinus  rhythm QRS Axis: normal Intervals: normal ST/T Wave abnormalities: normal Narrative Interpretation: no evidence of acute ischemia    RADIOLOGY    PROCEDURES:  Critical Care performed: No  Procedures   MEDICATIONS ORDERED IN ED: Medications  aspirin EC tablet 81 mg (81 mg Oral Given 03/02/23 1136)  cloNIDine (CATAPRES) tablet 0.3 mg (0.3 mg Oral Given 03/02/23 1138)  clopidogrel (PLAVIX) tablet 75 mg (75 mg Oral Given 03/02/23 1137)  enalapril (VASOTEC) tablet 40 mg (40 mg Oral Given 03/02/23 1217)  isosorbide mononitrate (IMDUR) 24 hr tablet 30 mg (30 mg Oral Given 03/02/23 1138)  metoprolol succinate (TOPROL-XL) 24 hr tablet 100 mg (100 mg Oral Given 03/02/23 1137)  rosuvastatin (CRESTOR) tablet 40 mg (40 mg Oral Given 03/02/23 1137)  sodium chloride 0.9 % bolus 500 mL (0 mLs Intravenous Stopped 03/02/23 1257)     IMPRESSION / MDM / ASSESSMENT AND PLAN / ED COURSE  I reviewed the triage vital signs and the nursing  notes.  67 year old male with PMH as noted above presents with an episode of near syncope while walking outside.  He had not eaten breakfast or taking any of his morning medications including multiple antihypertensives.  He denies any acute symptoms at this time.  On exam he is overall well-appearing.  Neurologic exam is nonfocal.  He is significantly hypertensive with otherwise normal vital signs.  Differential diagnosis includes, but is not limited to, dehydration/hypovolemia, heat exhaustion, vasovagal near syncope, less likely primary cardiac etiology.  Although the patient's blood pressure is significantly elevated here he is not having any signs or symptoms of endorgan dysfunction.  There is no evidence of hypertensive emergency.  I have ordered the patient his clonidine and other antihypertensives.  We will obtain basic labs, cardiac enzymes, give a fluid bolus, and reassess.  Patient's presentation is most consistent with acute complicated illness / injury requiring diagnostic workup.  The patient is on the cardiac monitor to evaluate for evidence of arrhythmia and/or significant heart rate changes.  ----------------------------------------- 3:14 PM on 03/02/2023 -----------------------------------------  Lab workup is overall reassuring.  Electrolytes are normal.  CBC shows no leukocytosis or anemia.  Initial troponin was 16 and repeat is 23 which is not a significant increase.  This is likely related to the patient's hypertension earlier.  The patient's blood pressure has normalized with his home medications.  He has been resting comfortably for the last couple of hours.  He currently is asymptomatic and feels well.  I did consider whether he may benefit from inpatient admission for monitoring given the near syncopal event and his age and comorbidities.  However with the negative workup, and improved blood pressure, and resolved symptoms I think discharge is reasonable.  The patient  states he feels well and would like to go home.  I counseled the patient on the results of the workup.  I advised him not to take any additional blood pressure medication today and to resume his normal medications tomorrow morning.  I gave him strict return precautions and he expressed understanding.   FINAL CLINICAL IMPRESSION(S) / ED DIAGNOSES   Final diagnoses:  Near syncope  Hypertension, unspecified type     Rx / DC Orders   ED Discharge Orders     None        Note:  This document was prepared using Dragon voice recognition software and may include unintentional dictation errors.    Dionne Bucy, MD 03/02/23 [phone]

## 2023-03-02 NOTE — ED Triage Notes (Signed)
Pt presents to ED with c/o of syncopal episode while walking 3-4 miles home from [employer]. Pt is A&Ox4.

## 2023-03-02 NOTE — Discharge Instructions (Signed)
Do not take any evening blood pressure medications today.  You should resume all of your medications tomorrow morning as normal.  Make sure to drink plenty of fluids.  Return to the ER for new, worsening, or persistent severe weakness or lightheadedness, recurrent episodes of feeling like you are going to pass out or actually passing out, chest pain, difficulty breathing, or any other new or worsening symptoms that concern you.

## 2023-03-07 ENCOUNTER — Telehealth: Payer: Self-pay | Admitting: Nurse Practitioner

## 2023-03-07 NOTE — Telephone Encounter (Signed)
Vm not set up. Sent message to schedule ED follow up-Toni

## 2023-03-08 ENCOUNTER — Telehealth: Payer: Self-pay

## 2023-03-08 NOTE — Telephone Encounter (Signed)
Transition Care Management Follow-up Telephone Call Date of discharge and from where: 03/02/2023 [facility] How have you been since you were released from the hospital? Patient is feeling better Any questions or concerns? No  Items Reviewed: Did the pt receive and understand the discharge instructions provided? Yes  Medications obtained and verified? Yes  Other? No  Any new allergies since your discharge? No  Dietary orders reviewed? Yes Do you have support at home? Yes   Follow up appointments reviewed:  PCP Hospital f/u appt confirmed? Yes  Scheduled to see Sallyanne Kuster, NP on 03/10/2023 @ [facility]. Specialist Hospital f/u appt confirmed? No  Scheduled to see  on  @ . Are transportation arrangements needed? No  If their condition worsens, is the pt aware to call PCP or go to the Emergency Dept.? Yes Was the patient provided with contact information for the PCP's office or ED? Yes Was to pt encouraged to call back with questions or concerns? Yes  Howard Weaver [facility]  [facility] Population Health Community Resource Care Guide   [email or web address]  ?? [phone]   Website: [email or web address]  [email or web address]

## 2023-03-10 ENCOUNTER — Ambulatory Visit: Payer: Medicare Other | Admitting: Nurse Practitioner

## 2023-03-10 ENCOUNTER — Encounter: Payer: Self-pay | Admitting: Nurse Practitioner

## 2023-03-10 VITALS — BP 124/80 | HR 71 | Temp 98.6°F | Resp 16 | Ht 69.0 in | Wt 210.0 lb

## 2023-03-10 DIAGNOSIS — R55 Syncope and collapse: Secondary | ICD-10-CM

## 2023-03-10 DIAGNOSIS — I10 Essential (primary) hypertension: Secondary | ICD-10-CM | POA: Diagnosis not present

## 2023-03-10 NOTE — Progress Notes (Signed)
[facility] [address] [place], [place] [address]  Internal MEDICINE  Office Visit Note  Patient Name: Howard Weaver  [id number]  [id number]  Date of Service: 03/10/2023  Chief Complaint  Patient presents with   Depression   Hypertension   Hyperlipidemia   Follow-up    ED f/u syncope.     HPI Howard Weaver presents for a follow-up visit for ED visit for syncope Reports that the "sun got to him" Troponins were slightly elevated but trended down Did not take medications that day due to it being hot, blood pressure spiked.  No imaging done in ER, EKG and labs were reassuring and he was discharged home from ER.  Does not need any refills.    Current Medication: Outpatient Encounter Medications as of 03/10/2023  Medication Sig   aspirin EC 81 MG tablet Take 1 tablet (81 mg total) by mouth daily.   busPIRone (BUSPAR) 15 MG tablet Take 15 mg twice a day   chlorthalidone (HYGROTON) 25 MG tablet Take 1 tablet (25 mg total) by mouth daily.   clonazePAM (KLONOPIN) 0.5 MG tablet Take(0.25 mg ) 1/2  tablet daily.   cloNIDine (CATAPRES) 0.3 MG tablet Take 1 tablet (0.3 mg total) by mouth at bedtime.   clopidogrel (PLAVIX) 75 MG tablet Take 1 tablet (75 mg total) by mouth daily.   enalapril (VASOTEC) 20 MG tablet Take 2 tablets (40 mg total) by mouth daily.   escitalopram (LEXAPRO) 20 MG tablet Take 1 tablet (20 mg total) by mouth daily.   HYDROcodone-acetaminophen (NORCO/VICODIN) 5-325 MG tablet Take 1 tablet by mouth every 6 (six) hours as needed for moderate pain.   isosorbide mononitrate (IMDUR) 30 MG 24 hr tablet Take 1 tablet (30 mg total) by mouth daily.   metoprolol succinate (TOPROL-XL) 100 MG 24 hr tablet Take 1 tablet (100 mg total) by mouth daily. Take with or immediately following a meal.   pantoprazole (PROTONIX) 40 MG tablet Take 1 tablet (40 mg total) by mouth daily.   rosuvastatin (CRESTOR) 40 MG tablet Take 1 tablet (40 mg total) by mouth daily.   No  facility-administered encounter medications on file as of 03/10/2023.    Surgical History: Past Surgical History:  Procedure Laterality Date   CARDIAC CATHETERIZATION Right 11/25/2015   Procedure: Left Heart Cath and Coronary Angiography;  Surgeon: Laurier Nancy, MD;  Location: [facility] INVASIVE CV LAB;  Service: Cardiovascular;  Laterality: Right;   CEREBRAL ANEURYSM REPAIR  2014   ESOPHAGOGASTRODUODENOSCOPY  04/12/2014   STOMACH SURGERY     bleeding ulcer repair   TEE WITHOUT CARDIOVERSION Right 01/17/2020   Procedure: TRANSESOPHAGEAL ECHOCARDIOGRAM (TEE);  Surgeon: Laurier Nancy, MD;  Location: [facility] ORS;  Service: Cardiovascular;  Laterality: Right;   XI ROBOTIC LAPAROSCOPIC ASSISTED APPENDECTOMY N/A 11/11/2021   Procedure: XI ROBOTIC LAPAROSCOPIC ASSISTED APPENDECTOMY;  Surgeon: [name], MD;  Location: [facility] ORS;  Service: General;  Laterality: N/A;    Medical History: Past Medical History:  Diagnosis Date   Appendicitis 09/24/2021   Bleeding stomach ulcer 2018   Brain aneurysm 2014   CAD (coronary artery disease)    Chest pain    CKD (chronic kidney disease) stage 3, GFR 30-59 ml/min (HCC)    Depression    DU (duodenal ulcer)    DVT of leg (deep venous thrombosis) (HCC) 01/16/2020   GAD (generalized anxiety disorder)    Hyperlipidemia    Hypertension    Myocardial infarction (HCC)    2017 & 2019  Radiculopathy of thoracolumbar region    Stroke [facility])    2014 & 2021   SVT (supraventricular tachycardia)    Tachycardia     Family History: Family History  Problem Relation Age of Onset   Hypertension Mother    Diabetes Mother    Depression Mother    Hypertension Father     Social History   Socioeconomic History   Marital status: Legally Separated    Spouse name: Not on file   Number of children: Not on file   Years of education: Not on file   Highest education level: Not on file  Occupational History   Not on file  Tobacco Use   Smoking status:  Former    Current packs/day: 0.00    Types: Cigarettes    Quit date: 11/25/1975    Years since quitting: 47.3   Smokeless tobacco: Never  Vaping Use   Vaping status: Never Used  Substance and Sexual Activity   Alcohol use: Not Currently   Drug use: No   Sexual activity: Not on file  Other Topics Concern   Not on file  Social History Narrative   Lives with sister in law   Social Determinants of Health   Financial Resource Strain: Not on file  Food Insecurity: Not on file  Transportation Needs: Not on file  Physical Activity: Not on file  Stress: Not on file  Social Connections: Not on file  Intimate Partner Violence: Not on file      Review of Systems  Constitutional:  Negative for chills, fatigue and unexpected weight change.  HENT:  Negative for congestion, rhinorrhea, sneezing and sore throat.   Eyes:  Negative for pain, redness and visual disturbance.  Respiratory: Negative.  Negative for cough, chest tightness, shortness of breath and wheezing.   Cardiovascular: Negative.  Negative for chest pain and palpitations.  Gastrointestinal: Negative.  Negative for abdominal pain, constipation, diarrhea, nausea and vomiting.  Genitourinary:  Negative for dysuria and frequency.  Musculoskeletal:  Positive for gait problem. Negative for arthralgias, back pain, joint swelling and neck pain.  Skin:  Negative for rash.  Neurological:  Negative for tremors, facial asymmetry, weakness, numbness and headaches.  Hematological:  Negative for adenopathy. Does not bruise/bleed easily.  Psychiatric/Behavioral:  Negative for behavioral problems (Depression), sleep disturbance and suicidal ideas. The patient is not nervous/anxious.     Vital Signs: BP 124/80   Pulse 71   Temp 98.6 F (37 C)   Resp 16   Ht 5\' 9"  (1.753 m)   Wt 210 lb (95.3 kg)   SpO2 96%   BMI 31.01 kg/m    Physical Exam Vitals reviewed.  Constitutional:      General: He is not in acute distress.     Appearance: Normal appearance. He is obese. He is not ill-appearing.  HENT:     Head: Normocephalic and atraumatic.  Eyes:     Pupils: Pupils are equal, round, and reactive to light.  Cardiovascular:     Rate and Rhythm: Normal rate and regular rhythm.  Pulmonary:     Effort: Pulmonary effort is normal. No respiratory distress.  Neurological:     Mental Status: He is alert and oriented to person, place, and time.  Psychiatric:        Mood and Affect: Mood normal.        Behavior: Behavior normal.        Assessment/Plan: 1. Essential hypertension Reminded patient to take his medications every day even if  he is going to be out in the sun.   2. Near syncope Be cautious when working out in the sun and heat    General Counseling: Howard Weaver verbalizes understanding of the findings of todays visit and agrees with plan of treatment. I have discussed any further diagnostic evaluation that may be needed or ordered today. We also reviewed his medications today. he has been encouraged to call the office with any questions or concerns that should arise related to todays visit.    No orders of the defined types were placed in this encounter.   No orders of the defined types were placed in this encounter.   Return if symptoms worsen or fail to improve, for make sure regular follow up in scheduled..   Total time spent:20 Minutes Time spent includes review of chart, medications, test results, and follow up plan with the patient.   [place] Controlled Substance Database was reviewed by me.  This patient was seen by Sallyanne Kuster, FNP-C in collaboration with Dr. Beverely Risen as a part of collaborative care agreement.   Breckin Zafar R. Tedd Sias, MSN, FNP-C Internal medicine

## 2023-04-14 ENCOUNTER — Other Ambulatory Visit: Payer: Self-pay

## 2023-04-14 DIAGNOSIS — I10 Essential (primary) hypertension: Secondary | ICD-10-CM | POA: Insufficient documentation

## 2023-04-14 DIAGNOSIS — N281 Cyst of kidney, acquired: Secondary | ICD-10-CM | POA: Diagnosis not present

## 2023-04-14 DIAGNOSIS — I2510 Atherosclerotic heart disease of native coronary artery without angina pectoris: Secondary | ICD-10-CM | POA: Insufficient documentation

## 2023-04-14 DIAGNOSIS — R109 Unspecified abdominal pain: Secondary | ICD-10-CM | POA: Diagnosis not present

## 2023-04-14 DIAGNOSIS — R1031 Right lower quadrant pain: Secondary | ICD-10-CM | POA: Diagnosis not present

## 2023-04-14 DIAGNOSIS — Z743 Need for continuous supervision: Secondary | ICD-10-CM | POA: Diagnosis not present

## 2023-04-14 DIAGNOSIS — K255 Chronic or unspecified gastric ulcer with perforation: Secondary | ICD-10-CM | POA: Diagnosis not present

## 2023-04-14 DIAGNOSIS — K4020 Bilateral inguinal hernia, without obstruction or gangrene, not specified as recurrent: Secondary | ICD-10-CM | POA: Diagnosis not present

## 2023-04-14 DIAGNOSIS — R6889 Other general symptoms and signs: Secondary | ICD-10-CM | POA: Diagnosis not present

## 2023-04-14 LAB — COMPREHENSIVE METABOLIC PANEL
ALT: 15 U/L (ref 0–44)
AST: 19 U/L (ref 15–41)
Albumin: 4.2 g/dL (ref 3.5–5.0)
Alkaline Phosphatase: 65 U/L (ref 38–126)
Anion gap: 12 (ref 5–15)
BUN: 23 mg/dL (ref 8–23)
CO2: 24 mmol/L (ref 22–32)
Calcium: 10.1 mg/dL (ref 8.9–10.3)
Chloride: 100 mmol/L (ref 98–111)
Creatinine, Ser: 1.22 mg/dL (ref 0.61–1.24)
GFR, Estimated: >60 mL/min (ref >60)
Glucose, Bld: 102 mg/dL — ABNORMAL HIGH (ref 70–99)
Potassium: 3.8 mmol/L (ref 3.5–5.1)
Sodium: 136 mmol/L (ref 135–145)
Total Bilirubin: 0.8 mg/dL (ref 0.3–1.2)
Total Protein: 8.1 g/dL (ref 6.5–8.1)

## 2023-04-14 LAB — CBC
HCT: 50.1 % (ref 39.0–52.0)
Hemoglobin: 16.5 g/dL (ref 13.0–17.0)
MCH: 28.8 pg (ref 26.0–34.0)
MCHC: 32.9 g/dL (ref 30.0–36.0)
MCV: 87.4 fL (ref 80.0–100.0)
Platelets: 231 10*3/uL (ref 150–400)
RBC: 5.73 MIL/uL (ref 4.22–5.81)
RDW: 13.2 % (ref 11.5–15.5)
WBC: 8 10*3/uL (ref 4.0–10.5)
nRBC: 0 % (ref 0.0–0.2)

## 2023-04-14 LAB — LIPASE, BLOOD: Lipase: 42 U/L (ref 11–51)

## 2023-04-14 NOTE — ED Triage Notes (Signed)
First Nurse Note;  Pt via EMS from home. Pt c/o abd pain for the past 3 hour, RLQ, tenderness on palpations. Pt is A&Ox4 and NAD 224/116  90 CBG 12 lead unremarkable

## 2023-04-14 NOTE — ED Triage Notes (Signed)
Pt A&Ox4 coming in via EMS complaint of abdominal pain that started around 1400. Patient states pain is on RLQ and is now radiating to the back. Pain 9/10. Denies N/V/D. Hx of high BP pt did not take BP medicine today.

## 2023-04-15 ENCOUNTER — Emergency Department
Admission: EM | Admit: 2023-04-15 | Discharge: 2023-04-15 | Disposition: A | Discharge status: Home / Self Care | Payer: Medicare Other | Attending: Emergency Medicine | Admitting: Emergency Medicine

## 2023-04-15 ENCOUNTER — Emergency Department: Payer: Medicare Other

## 2023-04-15 DIAGNOSIS — K4020 Bilateral inguinal hernia, without obstruction or gangrene, not specified as recurrent: Secondary | ICD-10-CM | POA: Diagnosis not present

## 2023-04-15 DIAGNOSIS — K255 Chronic or unspecified gastric ulcer with perforation: Secondary | ICD-10-CM | POA: Diagnosis not present

## 2023-04-15 DIAGNOSIS — R109 Unspecified abdominal pain: Secondary | ICD-10-CM | POA: Diagnosis not present

## 2023-04-15 DIAGNOSIS — N281 Cyst of kidney, acquired: Secondary | ICD-10-CM | POA: Diagnosis not present

## 2023-04-15 LAB — URINALYSIS, ROUTINE W REFLEX MICROSCOPIC
Bilirubin Urine: NEGATIVE
Glucose, UA: NEGATIVE mg/dL
Hgb urine dipstick: NEGATIVE
Ketones, ur: NEGATIVE mg/dL
Leukocytes,Ua: NEGATIVE
Nitrite: NEGATIVE
Protein, ur: NEGATIVE mg/dL
Specific Gravity, Urine: 1.043 — ABNORMAL HIGH (ref 1.005–1.030)
pH: 6 (ref 5.0–8.0)

## 2023-04-15 MED ORDER — IOHEXOL 300 MG/ML  SOLN
100.0000 mL | Freq: Once | INTRAMUSCULAR | Status: AC | PRN
  Administered 2023-04-15: 100 mL via INTRAVENOUS

## 2023-04-15 MED ORDER — MORPHINE SULFATE (PF) 4 MG/ML IV SOLN
4.0000 mg | Freq: Once | INTRAVENOUS | Status: AC
  Administered 2023-04-15: 4 mg via INTRAVENOUS
  Filled 2023-04-15: qty 1

## 2023-04-15 MED ORDER — ENALAPRIL MALEATE 10 MG PO TABS
40.0000 mg | ORAL_TABLET | Freq: Once | ORAL | Status: AC
  Administered 2023-04-15: 40 mg via ORAL
  Filled 2023-04-15: qty 4

## 2023-04-15 MED ORDER — CLONIDINE HCL 0.1 MG PO TABS
0.3000 mg | ORAL_TABLET | Freq: Once | ORAL | Status: AC
  Administered 2023-04-15: 0.3 mg via ORAL
  Filled 2023-04-15: qty 3

## 2023-04-15 NOTE — ED Provider Notes (Signed)
[facility] Provider Note    Event Date/Time   First MD Initiated Contact with Patient 04/15/23 [phone]     (approximate)   History   Abdominal Pain   HPI  Howard Weaver is a 67 y.o. male history of hypertension coronary disease  Patient reports that this started about 2 AM.  Symptoms bloated in his right mid to right lower abdomen.  He reports he has previously had his appendix removed  No chest pain no shortness of breath.  No nausea or vomiting.  He persistent pain and it is hard to describe crampy-like       Physical Exam   Triage Vital Signs: ED Triage Vitals  Encounter Vitals Group     BP 04/14/23 1802 (!) 196/148     Systolic BP Percentile --      Diastolic BP Percentile --      Pulse Rate 04/14/23 1802 94     Resp 04/14/23 1802 18     Temp 04/14/23 1802 98 F (36.7 C)     Temp Source 04/14/23 1802 Oral     SpO2 04/14/23 1802 97 %     Weight 04/14/23 1758 210 lb (95.3 kg)     Height 04/14/23 1758 5\' 9"  (1.753 m)     Head Circumference --      Peak Flow --      Pain Score 04/14/23 1757 9     Pain Loc --      Pain Education --      Exclude from Growth Chart --     Most recent vital signs: Vitals:   04/15/23 0041 04/15/23 0400  BP: (!) 198/129 119/86  Pulse:  87  Resp:  16  Temp:  97.9 F (36.6 C)  SpO2:  99%     General: Awake, no distress.  CV:  Good peripheral perfusion.  Normal tones and rate Resp:  Normal effort.  Clear bilateral Abd:  No distention.  Soft, reports tenderness in the right mid abdomen.  No costovertebral angle tenderness to percussion.  Negative Murphy.  No focal pain in the right lower quadrant.  No rebound or guarding.  Left side of the abdomen soft nontender nondistended Other:     ED Results / Procedures / Treatments   Labs (all labs ordered are listed, but only abnormal results are displayed) Labs Reviewed  COMPREHENSIVE METABOLIC PANEL - Abnormal; Notable for the following components:       Result Value   Glucose, Bld 102 (*)    All other components within normal limits  URINALYSIS, ROUTINE W REFLEX MICROSCOPIC - Abnormal; Notable for the following components:   Color, Urine YELLOW (*)    APPearance CLEAR (*)    Specific Gravity, Urine 1.043 (*)    All other components within normal limits  LIPASE, BLOOD  CBC    RADIOLOGY  CT scan interpreted by me is negative for acute gross pathology  CT ABDOMEN PELVIS W CONTRAST  Result Date: 04/15/2023 CLINICAL DATA:  Right abdominal pain, prior appendectomy, history of gastric surgery for perforated ulcer EXAM: CT ABDOMEN AND PELVIS WITH CONTRAST TECHNIQUE: Multidetector CT imaging of the abdomen and pelvis was performed using the standard protocol following bolus administration of intravenous contrast. RADIATION DOSE REDUCTION: This exam was performed according to the departmental dose-optimization program which includes automated exposure control, adjustment of the mA and/or kV according to patient size and/or use of iterative reconstruction technique. CONTRAST:  OMNIPAQUE IOHEXOL 300 MG/ML  SOLN COMPARISON:  09/25/2021 FINDINGS: Lower chest: Lung bases are clear. Hepatobiliary: Liver is within normal limits. Gallbladder is unremarkable. No intrahepatic or extrahepatic duct dilatation. Pancreas: Within normal limits. Spleen: Within normal limits. Adrenals/Urinary Tract: Adrenal glands are within normal limits. Small bilateral renal cysts, measuring up to 14 mm in the left upper kidney (series 7/image 9), benign (Bosniak I). No follow-up is recommended. No hydronephrosis. Bladder is within normal limits. Stomach/Bowel: Stomach is within normal limits. No evidence of bowel obstruction. Prior appendectomy. No colonic wall thickening or inflammatory changes. Vascular/Lymphatic: No evidence of abdominal aortic aneurysm. Atherosclerotic calcifications of the abdominal aorta and branch vessels, although vessels remain patent. No suspicious  abdominopelvic lymphadenopathy. Reproductive: Prostate is unremarkable. Other: No abdominopelvic ascites. Tiny fat containing bilateral inguinal hernias (series 2/image 91). Musculoskeletal: Mild degenerative changes at L4-5. IMPRESSION: No CT findings to account for the patient's right abdominal pain. Prior appendectomy. Electronically Signed   By: Charline Bills M.D.   On: 04/15/2023 01:38      PROCEDURES:  Critical Care performed: No  Procedures   MEDICATIONS ORDERED IN ED: Medications  morphine (PF) 4 MG/ML injection 4 mg (4 mg Intravenous Given 04/15/23 0043)  cloNIDine (CATAPRES) tablet 0.3 mg (0.3 mg Oral Given 04/15/23 0043)  enalapril (VASOTEC) tablet 40 mg (40 mg Oral Given 04/15/23 0043)  iohexol (OMNIPAQUE) 300 MG/ML solution 100 mL (100 mLs Intravenous Contrast Given 04/15/23 0123)     IMPRESSION / MDM / ASSESSMENT AND PLAN / ED COURSE  I reviewed the triage vital signs and the nursing notes.                              Differential diagnosis includes but is not limited to, abdominal perforation, aortic dissection, cholecystitis, appendicitis, diverticulitis, colitis, esophagitis/gastritis, kidney stone, pyelonephritis, urinary tract infection, aortic aneurysm. All are considered in decision and treatment plan. Based upon the patient's presentation and risk factors, we will proceed with obtaining CT imaging.  Additionally patient reports that he has not taken his blood pressure medication today, we will provide him his home medications.  He has no signs or symptoms of hypertensive emergency or urgency    No fevers or chills.  No nausea or vomiting.  Patient has strong palpable dorsalis pedis and posterior tibial pulses bilaterally.  No evidence of ischemia.   Patient's presentation is most consistent with acute complicated illness / injury requiring diagnostic workup.   The patient is on the cardiac monitor to evaluate for evidence of arrhythmia and/or significant  heart rate changes.  ----------------------------------------- 4:49 AM on 04/15/2023 ----------------------------------------- Patient resting comfortably.  Reports all of his pain and symptoms have since gone away.  Vital signs are normal he is in no distress.  He is not driving himself home.  Discussed careful return precautions with the patient who is agreeable.  At this juncture all symptoms and pain have abated  Return precautions and treatment recommendations and follow-up discussed with the patient who is agreeable with the plan.      FINAL CLINICAL IMPRESSION(S) / ED DIAGNOSES   Final diagnoses:  Right lateral abdominal pain     Rx / DC Orders   ED Discharge Orders     None        Note:  This document was prepared using Dragon voice recognition software and may include unintentional dictation errors.   Sharyn Creamer, MD 04/15/23 [phone]

## 2023-04-15 NOTE — ED Notes (Signed)
Pt seen ambulating with steady gait from bathroom across from pts room. VS obtained and pt provided with a urinal to collect a sample of urine in the future. Pt repeatedly denied being able to produce any more urine at this time.

## 2023-04-15 NOTE — Discharge Instructions (Signed)
No driving this morning.   Please return to the emergency room right away if you are to develop a fever, severe nausea, your pain becomes severe or worsens, you are unable to keep food down, begin vomiting any dark or bloody fluid, you develop any dark or bloody stools, feel dehydrated, or other new concerns or symptoms arise.

## 2023-05-03 ENCOUNTER — Ambulatory Visit: Payer: Medicare HMO | Admitting: Nurse Practitioner

## 2023-05-03 ENCOUNTER — Telehealth: Payer: Self-pay | Admitting: Nurse Practitioner

## 2023-05-03 NOTE — Telephone Encounter (Signed)
Vm not set up unable to lvm regarding missed appt 05/03/23

## 2023-07-22 ENCOUNTER — Emergency Department: Payer: Medicare HMO

## 2023-07-22 ENCOUNTER — Other Ambulatory Visit: Payer: Self-pay

## 2023-07-22 ENCOUNTER — Emergency Department
Admission: EM | Admit: 2023-07-22 | Discharge: 2023-07-22 | Disposition: A | Discharge status: Home / Self Care | Payer: Medicare HMO | Attending: Emergency Medicine | Admitting: Emergency Medicine

## 2023-07-22 DIAGNOSIS — R110 Nausea: Secondary | ICD-10-CM | POA: Diagnosis not present

## 2023-07-22 DIAGNOSIS — R112 Nausea with vomiting, unspecified: Secondary | ICD-10-CM | POA: Diagnosis not present

## 2023-07-22 DIAGNOSIS — I10 Essential (primary) hypertension: Secondary | ICD-10-CM | POA: Diagnosis not present

## 2023-07-22 DIAGNOSIS — R079 Chest pain, unspecified: Secondary | ICD-10-CM | POA: Diagnosis not present

## 2023-07-22 DIAGNOSIS — R61 Generalized hyperhidrosis: Secondary | ICD-10-CM | POA: Insufficient documentation

## 2023-07-22 DIAGNOSIS — I2510 Atherosclerotic heart disease of native coronary artery without angina pectoris: Secondary | ICD-10-CM | POA: Diagnosis not present

## 2023-07-22 DIAGNOSIS — R0689 Other abnormalities of breathing: Secondary | ICD-10-CM | POA: Diagnosis not present

## 2023-07-22 DIAGNOSIS — R072 Precordial pain: Secondary | ICD-10-CM | POA: Insufficient documentation

## 2023-07-22 DIAGNOSIS — R6889 Other general symptoms and signs: Secondary | ICD-10-CM | POA: Diagnosis not present

## 2023-07-22 DIAGNOSIS — I499 Cardiac arrhythmia, unspecified: Secondary | ICD-10-CM | POA: Diagnosis not present

## 2023-07-22 DIAGNOSIS — R0789 Other chest pain: Secondary | ICD-10-CM

## 2023-07-22 DIAGNOSIS — R519 Headache, unspecified: Secondary | ICD-10-CM | POA: Diagnosis not present

## 2023-07-22 DIAGNOSIS — Z743 Need for continuous supervision: Secondary | ICD-10-CM | POA: Diagnosis not present

## 2023-07-22 DIAGNOSIS — I771 Stricture of artery: Secondary | ICD-10-CM | POA: Diagnosis not present

## 2023-07-22 LAB — COMPREHENSIVE METABOLIC PANEL
ALT: 15 U/L (ref 0–44)
AST: 18 U/L (ref 15–41)
Albumin: 3.9 g/dL (ref 3.5–5.0)
Alkaline Phosphatase: 79 U/L (ref 38–126)
Anion gap: 11 (ref 5–15)
BUN: 17 mg/dL (ref 8–23)
CO2: 22 mmol/L (ref 22–32)
Calcium: 9.5 mg/dL (ref 8.9–10.3)
Chloride: 105 mmol/L (ref 98–111)
Creatinine, Ser: 1.2 mg/dL (ref 0.61–1.24)
GFR, Estimated: >60 mL/min (ref >60)
Glucose, Bld: 184 mg/dL — ABNORMAL HIGH (ref 70–99)
Potassium: 3.8 mmol/L (ref 3.5–5.1)
Sodium: 138 mmol/L (ref 135–145)
Total Bilirubin: 0.6 mg/dL (ref <1.2)
Total Protein: 7.7 g/dL (ref 6.5–8.1)

## 2023-07-22 LAB — CBC
HCT: 47.4 % (ref 39.0–52.0)
Hemoglobin: 15.9 g/dL (ref 13.0–17.0)
MCH: 28.2 pg (ref 26.0–34.0)
MCHC: 33.5 g/dL (ref 30.0–36.0)
MCV: 84 fL (ref 80.0–100.0)
Platelets: 263 10*3/uL (ref 150–400)
RBC: 5.64 MIL/uL (ref 4.22–5.81)
RDW: 13.7 % (ref 11.5–15.5)
WBC: 10.4 10*3/uL (ref 4.0–10.5)
nRBC: 0 % (ref 0.0–0.2)

## 2023-07-22 LAB — LIPASE, BLOOD: Lipase: 24 U/L (ref 11–51)

## 2023-07-22 LAB — TROPONIN I (HIGH SENSITIVITY)
Troponin I (High Sensitivity): 15 ng/L (ref <18)
Troponin I (High Sensitivity): 13 ng/L (ref <18)

## 2023-07-22 NOTE — ED Provider Notes (Signed)
Repeat troponin negative.  Patient reassessed exam reassuring.  Patient asymptomatic.  Discussed option for observation the hospital for chest pain rule out versus close outpatient follow-up which he would prefer to follow-up as an outpatient I think this is reasonable.   Willy Eddy, MD 07/22/23 [phone]

## 2023-07-22 NOTE — ED Provider Notes (Signed)
[facility] Provider Note    Event Date/Time   First MD Initiated Contact with Patient 07/22/23 [phone]     (approximate)   History   Chest Pain   HPI  Howard Weaver is a 67 y.o. male who presents to the ED for evaluation of Chest Pain   Review of PCP visit from July.  History of CAD and PAD, prescribed DAPT.  Embolic stroke, DVT, HTN  Patient presents to the ED via EMS from home for evaluation of chest pain.  Pain woke him from sleep a couple hours ago, substernal chest pain, nonradiating with associated diaphoresis and nausea.  Heaving without true emesis.  No syncope, back pain or shortness of breath.  No abdominal pain.  On arrival to the ED he reports the pain is about 2/10 intensity, improved from 10/10 intensity.  2017 left heart cath with mild multivessel disease. 30-40% stenosis. No intervention.   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  Physical Exam   Triage Vital Signs: ED Triage Vitals [07/22/23 0544]  Encounter Vitals Group     BP      Systolic BP Percentile      Diastolic BP Percentile      Pulse      Resp      Temp      Temp src      SpO2      Weight      Height      Head Circumference      Peak Flow      Pain Score 2     Pain Loc      Pain Education      Exclude from Growth Chart     Most recent vital signs: Vitals:   07/22/23 0545  BP: (!) 201/135  Pulse: 98  Resp: (!) 22  Temp: 98.1 F (36.7 C)  SpO2: 95%    General: Awake, no distress.  Diaphoretic, occasional heaving CV:  Good peripheral perfusion.  Resp:  Normal effort.  No wheezing Abd:  No distention.  Soft and benign MSK:  No deformity noted.  Neuro:  No focal deficits appreciated. Other:     ED Results / Procedures / Treatments   Labs (all labs ordered are listed, but only abnormal results are displayed) Labs Reviewed  COMPREHENSIVE  METABOLIC PANEL - Abnormal; Notable for the following components:      Result Value   Glucose, Bld 184 (*)    All other components within normal limits  CBC  LIPASE, BLOOD  TROPONIN I (HIGH SENSITIVITY)    EKG Sinus tachycardia with a rate of 100 bpm.  Leftward axis and normal intervals.  No STEMI but some nonspecific ST changes.  Lateral J-point depressions.  RADIOLOGY CXR interpreted by me without evidence of acute cardiopulmonary pathology.  Official radiology report(s): DG Chest 2 View  Result Date: 07/22/2023 CLINICAL DATA:  67 year old male with chest pain for 2 hours. Headache and nausea vomiting. Diaphoresis. EXAM: CHEST - 2 VIEW COMPARISON:  Chest radiographs 01/21/2017 and earlier. FINDINGS: PA and lateral views 0606 hours. Chronically tortuous thoracic aorta, stable contour since 2018. Cardiac size remains normal. Other mediastinal contours are within normal limits. Visualized tracheal air column is within normal limits. Stable lung volumes. No pneumothorax, pulmonary edema, pleural effusion or confluent lung opacity. No acute osseous abnormality identified. Negative visible bowel gas. IMPRESSION: No acute  cardiopulmonary abnormality. Chronically tortuous thoracic aorta contour is stable compared to 2018. Electronically Signed   By: Odessa Fleming M.D.   On: 07/22/2023 06:27    PROCEDURES and INTERVENTIONS:  .1-3 Lead EKG Interpretation  Performed by: Delton Prairie, MD Authorized by: Delton Prairie, MD     Interpretation: normal     ECG rate:  88   ECG rate assessment: normal     Rhythm: sinus rhythm     Ectopy: none     Conduction: normal     Medications - No data to display   IMPRESSION / MDM / ASSESSMENT AND PLAN / ED COURSE  I reviewed the triage vital signs and the nursing notes.  Differential diagnosis includes, but is not limited to, ACS, PTX, PNA, muscle strain/spasm, PE, dissection, anxiety, pleural effusion  {Patient presents with symptoms of an acute illness or  injury that is potentially life-threatening.  Patient presents to the ED with resolving episode of chest discomfort and diaphoresis.  He looks fairly unwell on his arrival and this quickly resolves and he reports being asymptomatic throughout the rest of my shift.  EKG without clear ischemic features, possibly some subtle J-point depressions to V5 and 6 but certainly no STEMI criteria.  His BP normalizes, first troponin is normal as well as a metabolic panel and CBC.  CXR is clear.  We will keep him on the monitor, repeat a troponin and reassess.  Signed out to oncoming physician  Clinical Course as of 07/22/23 0643  Fri Jul 22, 2023  0602 Reassessed before XR. He reports feeling fine now. BP improving [DS]  0642 Reassessed. Feeling well. BP 140/100 [DS]    Clinical Course User Index [DS] Delton Prairie, MD     FINAL CLINICAL IMPRESSION(S) / ED DIAGNOSES   Final diagnoses:  Other chest pain     Rx / DC Orders   ED Discharge Orders     None        Note:  This document was prepared using Dragon voice recognition software and may include unintentional dictation errors.   Delton Prairie, MD 07/22/23 [phone]

## 2023-07-22 NOTE — ED Notes (Signed)
ED Provider at bedside. 

## 2023-07-22 NOTE — ED Triage Notes (Signed)
Central chest pain described as dull. Onset 2 hrs pta. Hx of HTN and not compliant with home bp medication. Reports h/a and n/v. Pt HTN on scene and arrival. Pt diaphoretic. Pt alert and oriented following commands. Breathing unlabored speaking in full sentences with symmetric chest rise and fall.

## 2023-07-26 ENCOUNTER — Telehealth: Payer: Self-pay | Admitting: Nurse Practitioner

## 2023-07-26 NOTE — Telephone Encounter (Signed)
Attempted to contact patient for ED follow up. No answer, vm not set up-Toni

## 2023-09-23 ENCOUNTER — Telehealth: Payer: Self-pay | Admitting: Nurse Practitioner

## 2023-09-23 NOTE — Telephone Encounter (Signed)
Vm not set up, sent mychart message to confirm 09/30/23 appointment-Toni

## 2023-09-30 ENCOUNTER — Ambulatory Visit: Payer: Medicare HMO | Admitting: Nurse Practitioner

## 2023-10-02 ENCOUNTER — Inpatient Hospital Stay
Admission: EM | Admit: 2023-10-02 | Discharge: 2023-10-05 | DRG: 194 | Disposition: A | Discharge status: Home / Self Care | Payer: Medicare HMO | Attending: Internal Medicine | Admitting: Internal Medicine

## 2023-10-02 ENCOUNTER — Other Ambulatory Visit: Payer: Self-pay

## 2023-10-02 ENCOUNTER — Emergency Department: Payer: Medicare HMO

## 2023-10-02 ENCOUNTER — Encounter: Payer: Self-pay | Admitting: Emergency Medicine

## 2023-10-02 ENCOUNTER — Emergency Department
Admission: EM | Admit: 2023-10-02 | Discharge: 2023-10-02 | Disposition: A | Discharge status: Home / Self Care | Payer: Medicare HMO | Attending: Emergency Medicine | Admitting: Emergency Medicine

## 2023-10-02 DIAGNOSIS — R109 Unspecified abdominal pain: Secondary | ICD-10-CM | POA: Diagnosis not present

## 2023-10-02 DIAGNOSIS — Z818 Family history of other mental and behavioral disorders: Secondary | ICD-10-CM

## 2023-10-02 DIAGNOSIS — I771 Stricture of artery: Secondary | ICD-10-CM | POA: Diagnosis not present

## 2023-10-02 DIAGNOSIS — I129 Hypertensive chronic kidney disease with stage 1 through stage 4 chronic kidney disease, or unspecified chronic kidney disease: Secondary | ICD-10-CM | POA: Diagnosis not present

## 2023-10-02 DIAGNOSIS — F411 Generalized anxiety disorder: Secondary | ICD-10-CM | POA: Diagnosis present

## 2023-10-02 DIAGNOSIS — J101 Influenza due to other identified influenza virus with other respiratory manifestations: Secondary | ICD-10-CM

## 2023-10-02 DIAGNOSIS — I2583 Coronary atherosclerosis due to lipid rich plaque: Secondary | ICD-10-CM | POA: Diagnosis not present

## 2023-10-02 DIAGNOSIS — F32A Depression, unspecified: Secondary | ICD-10-CM | POA: Diagnosis not present

## 2023-10-02 DIAGNOSIS — K219 Gastro-esophageal reflux disease without esophagitis: Secondary | ICD-10-CM | POA: Diagnosis not present

## 2023-10-02 DIAGNOSIS — I10 Essential (primary) hypertension: Secondary | ICD-10-CM | POA: Insufficient documentation

## 2023-10-02 DIAGNOSIS — E782 Mixed hyperlipidemia: Secondary | ICD-10-CM | POA: Diagnosis present

## 2023-10-02 DIAGNOSIS — Z87891 Personal history of nicotine dependence: Secondary | ICD-10-CM | POA: Diagnosis not present

## 2023-10-02 DIAGNOSIS — R1084 Generalized abdominal pain: Secondary | ICD-10-CM

## 2023-10-02 DIAGNOSIS — J9811 Atelectasis: Secondary | ICD-10-CM | POA: Diagnosis present

## 2023-10-02 DIAGNOSIS — Z79899 Other long term (current) drug therapy: Secondary | ICD-10-CM | POA: Diagnosis not present

## 2023-10-02 DIAGNOSIS — I2510 Atherosclerotic heart disease of native coronary artery without angina pectoris: Secondary | ICD-10-CM | POA: Diagnosis present

## 2023-10-02 DIAGNOSIS — Z91148 Patient's other noncompliance with medication regimen for other reason: Secondary | ICD-10-CM | POA: Diagnosis not present

## 2023-10-02 DIAGNOSIS — N179 Acute kidney failure, unspecified: Secondary | ICD-10-CM | POA: Diagnosis present

## 2023-10-02 DIAGNOSIS — R55 Syncope and collapse: Secondary | ICD-10-CM | POA: Diagnosis not present

## 2023-10-02 DIAGNOSIS — I252 Old myocardial infarction: Secondary | ICD-10-CM | POA: Diagnosis not present

## 2023-10-02 DIAGNOSIS — Z7982 Long term (current) use of aspirin: Secondary | ICD-10-CM | POA: Diagnosis not present

## 2023-10-02 DIAGNOSIS — N183 Chronic kidney disease, stage 3 unspecified: Secondary | ICD-10-CM | POA: Diagnosis present

## 2023-10-02 DIAGNOSIS — Z7902 Long term (current) use of antithrombotics/antiplatelets: Secondary | ICD-10-CM

## 2023-10-02 DIAGNOSIS — Z86718 Personal history of other venous thrombosis and embolism: Secondary | ICD-10-CM

## 2023-10-02 DIAGNOSIS — Z8249 Family history of ischemic heart disease and other diseases of the circulatory system: Secondary | ICD-10-CM | POA: Diagnosis not present

## 2023-10-02 DIAGNOSIS — J40 Bronchitis, not specified as acute or chronic: Secondary | ICD-10-CM | POA: Diagnosis not present

## 2023-10-02 DIAGNOSIS — Z833 Family history of diabetes mellitus: Secondary | ICD-10-CM

## 2023-10-02 DIAGNOSIS — Z886 Allergy status to analgesic agent status: Secondary | ICD-10-CM

## 2023-10-02 DIAGNOSIS — Z1152 Encounter for screening for COVID-19: Secondary | ICD-10-CM | POA: Diagnosis not present

## 2023-10-02 DIAGNOSIS — Z8673 Personal history of transient ischemic attack (TIA), and cerebral infarction without residual deficits: Secondary | ICD-10-CM

## 2023-10-02 DIAGNOSIS — Z20822 Contact with and (suspected) exposure to covid-19: Secondary | ICD-10-CM | POA: Insufficient documentation

## 2023-10-02 DIAGNOSIS — Z880 Allergy status to penicillin: Secondary | ICD-10-CM | POA: Diagnosis not present

## 2023-10-02 DIAGNOSIS — R000 Tachycardia, unspecified: Secondary | ICD-10-CM | POA: Diagnosis not present

## 2023-10-02 DIAGNOSIS — N281 Cyst of kidney, acquired: Secondary | ICD-10-CM | POA: Diagnosis not present

## 2023-10-02 DIAGNOSIS — J111 Influenza due to unidentified influenza virus with other respiratory manifestations: Secondary | ICD-10-CM | POA: Diagnosis not present

## 2023-10-02 DIAGNOSIS — R531 Weakness: Secondary | ICD-10-CM | POA: Diagnosis not present

## 2023-10-02 HISTORY — DX: Influenza due to other identified influenza virus with other respiratory manifestations: J10.1

## 2023-10-02 LAB — CBC WITH DIFFERENTIAL/PLATELET
Abs Immature Granulocytes: 0.02 10*3/uL (ref 0.00–0.07)
Basophils Absolute: 0 10*3/uL (ref 0.0–0.1)
Basophils Relative: 0 %
Eosinophils Absolute: 0 10*3/uL (ref 0.0–0.5)
Eosinophils Relative: 1 %
HCT: 47.6 % (ref 39.0–52.0)
Hemoglobin: 15.7 g/dL (ref 13.0–17.0)
Immature Granulocytes: 0 %
Lymphocytes Relative: 3 %
Lymphs Abs: 0.3 10*3/uL — ABNORMAL LOW (ref 0.7–4.0)
MCH: 28 pg (ref 26.0–34.0)
MCHC: 33 g/dL (ref 30.0–36.0)
MCV: 85 fL (ref 80.0–100.0)
Monocytes Absolute: 0.8 10*3/uL (ref 0.1–1.0)
Monocytes Relative: 10 %
Neutro Abs: 7 10*3/uL (ref 1.7–7.7)
Neutrophils Relative %: 86 %
Platelets: 201 10*3/uL (ref 150–400)
RBC: 5.6 MIL/uL (ref 4.22–5.81)
RDW: 13.5 % (ref 11.5–15.5)
WBC: 8.1 10*3/uL (ref 4.0–10.5)
nRBC: 0 % (ref 0.0–0.2)

## 2023-10-02 LAB — COMPREHENSIVE METABOLIC PANEL
ALT: 18 U/L (ref 0–44)
AST: 20 U/L (ref 15–41)
Albumin: 4.2 g/dL (ref 3.5–5.0)
Alkaline Phosphatase: 79 U/L (ref 38–126)
Anion gap: 13 (ref 5–15)
BUN: 15 mg/dL (ref 8–23)
CO2: 25 mmol/L (ref 22–32)
Calcium: 9.9 mg/dL (ref 8.9–10.3)
Chloride: 96 mmol/L — ABNORMAL LOW (ref 98–111)
Creatinine, Ser: 1.22 mg/dL (ref 0.61–1.24)
GFR, Estimated: >60 mL/min (ref >60)
Glucose, Bld: 150 mg/dL — ABNORMAL HIGH (ref 70–99)
Potassium: 4.3 mmol/L (ref 3.5–5.1)
Sodium: 134 mmol/L — ABNORMAL LOW (ref 135–145)
Total Bilirubin: 0.7 mg/dL (ref 0.0–1.2)
Total Protein: 8.6 g/dL — ABNORMAL HIGH (ref 6.5–8.1)

## 2023-10-02 LAB — RESP PANEL BY RT-PCR (RSV, FLU A&B, COVID)  RVPGX2
Influenza A by PCR: POSITIVE — AB
Influenza B by PCR: NEGATIVE
Resp Syncytial Virus by PCR: NEGATIVE
SARS Coronavirus 2 by RT PCR: NEGATIVE

## 2023-10-02 LAB — LIPASE, BLOOD: Lipase: 25 U/L (ref 11–51)

## 2023-10-02 LAB — CBC
HCT: 48.7 % (ref 39.0–52.0)
Hemoglobin: 16.1 g/dL (ref 13.0–17.0)
MCH: 27.7 pg (ref 26.0–34.0)
MCHC: 33.1 g/dL (ref 30.0–36.0)
MCV: 83.8 fL (ref 80.0–100.0)
Platelets: 188 10*3/uL (ref 150–400)
RBC: 5.81 MIL/uL (ref 4.22–5.81)
RDW: 14 % (ref 11.5–15.5)
WBC: 6.5 10*3/uL (ref 4.0–10.5)
nRBC: 0 % (ref 0.0–0.2)

## 2023-10-02 MED ORDER — OSELTAMIVIR PHOSPHATE 75 MG PO CAPS
75.0000 mg | ORAL_CAPSULE | Freq: Two times a day (BID) | ORAL | Status: DC
  Administered 2023-10-03 – 2023-10-04 (×3): 75 mg via ORAL
  Filled 2023-10-02 (×3): qty 1

## 2023-10-02 MED ORDER — IOHEXOL 300 MG/ML  SOLN
100.0000 mL | Freq: Once | INTRAMUSCULAR | Status: AC | PRN
  Administered 2023-10-02: 100 mL via INTRAVENOUS

## 2023-10-02 MED ORDER — ONDANSETRON HCL 4 MG PO TABS: 4.0000 mg | ORAL_TABLET | Freq: Four times a day (QID) | ORAL | Status: DC | PRN

## 2023-10-02 MED ORDER — SODIUM CHLORIDE 0.9 % IV BOLUS
1000.0000 mL | Freq: Once | INTRAVENOUS | Status: AC
  Administered 2023-10-02: 1000 mL via INTRAVENOUS

## 2023-10-02 MED ORDER — MORPHINE SULFATE (PF) 4 MG/ML IV SOLN
4.0000 mg | Freq: Once | INTRAVENOUS | Status: AC
Start: 2023-10-02 — End: 2023-10-02
  Administered 2023-10-02: 4 mg via INTRAVENOUS
  Filled 2023-10-02: qty 1

## 2023-10-02 MED ORDER — ACETAMINOPHEN 325 MG PO TABS: 650.0000 mg | ORAL_TABLET | Freq: Four times a day (QID) | ORAL | Status: DC | PRN

## 2023-10-02 MED ORDER — ONDANSETRON HCL 4 MG/2ML IJ SOLN
4.0000 mg | Freq: Once | INTRAMUSCULAR | Status: AC
  Administered 2023-10-02: 4 mg via INTRAVENOUS
  Filled 2023-10-02: qty 2

## 2023-10-02 MED ORDER — KETOROLAC TROMETHAMINE 30 MG/ML IJ SOLN
30.0000 mg | Freq: Once | INTRAMUSCULAR | Status: AC
  Administered 2023-10-02: 30 mg via INTRAVENOUS
  Filled 2023-10-02: qty 1

## 2023-10-02 MED ORDER — ENOXAPARIN SODIUM 60 MG/0.6ML IJ SOSY
0.5000 mg/kg | PREFILLED_SYRINGE | INTRAMUSCULAR | Status: DC
  Administered 2023-10-02 – 2023-10-04 (×3): 50 mg via SUBCUTANEOUS
  Filled 2023-10-02 (×2): qty 0.6

## 2023-10-02 MED ORDER — OSELTAMIVIR PHOSPHATE 75 MG PO CAPS
75.0000 mg | ORAL_CAPSULE | Freq: Once | ORAL | Status: AC
  Administered 2023-10-03: 75 mg via ORAL
  Filled 2023-10-02 (×3): qty 1

## 2023-10-02 MED ORDER — ONDANSETRON HCL 4 MG/2ML IJ SOLN: 4.0000 mg | Freq: Four times a day (QID) | INTRAMUSCULAR | Status: DC | PRN

## 2023-10-02 MED ORDER — SODIUM CHLORIDE 0.9 % IV SOLN
Freq: Once | INTRAVENOUS | Status: AC
  Administered 2023-10-02: 500 mL via INTRAVENOUS

## 2023-10-02 MED ORDER — SODIUM CHLORIDE 0.9 % IV SOLN: INTRAVENOUS | Status: AC

## 2023-10-02 MED ORDER — ACETAMINOPHEN 650 MG RE SUPP: 650.0000 mg | Freq: Four times a day (QID) | RECTAL | Status: DC | PRN

## 2023-10-02 MED ORDER — ONDANSETRON HCL 4 MG PO TABS: 4.0000 mg | ORAL_TABLET | Freq: Four times a day (QID) | ORAL | 0 refills | Status: AC | PRN

## 2023-10-02 MED ORDER — ACETAMINOPHEN 500 MG PO TABS
1000.0000 mg | ORAL_TABLET | Freq: Once | ORAL | Status: AC
  Administered 2023-10-02: 1000 mg via ORAL
  Filled 2023-10-02: qty 2

## 2023-10-02 NOTE — Discharge Instructions (Signed)
You were seen in the ER for your abdominal pain.  Your test was positive which I suspect is the cause of your symptoms.  I have sent a prescription for nausea medication to your pharmacy that you can take as needed.  Follow-up your primary care doctor for further evaluation.  Return to the ER for new or worsening symptoms.

## 2023-10-02 NOTE — ED Notes (Signed)
RN to bedside to introduce self to pt. Pt is alert and oriented and in no distress.

## 2023-10-02 NOTE — ED Notes (Signed)
Writer RN requests Tamiflu be sent to ED as it is not currently available

## 2023-10-02 NOTE — ED Triage Notes (Addendum)
First Nurse Note;  Pt via [facility] from home. Pt was seen here this AM. Reports that he was dx with the Flu this morning here and states he felt like he was discharged too soon. Pt c/o flu like symptoms. 120 HR  96% on RA 16 RR  99.6 oral 240/150 BP (reports took medication 20 mins ago)

## 2023-10-02 NOTE — ED Provider Notes (Signed)
[facility] Provider Note    Event Date/Time   First MD Initiated Contact with Patient 10/02/23 [phone]     (approximate)   History   Abdominal Pain (PATIENT C/O GENERALIZED ABDOMINAL PAIN X 24 HOURS)   HPI  Howard Weaver is a 68 year old male presenting to the emergency room for evaluation of abdominal pain.  Patient reports that yesterday he had onset of generalized abdominal pain with associated nausea without vomiting and diarrhea.  No fevers or chills.  Son recently sick with the flu.     Physical Exam   Triage Vital Signs: ED Triage Vitals  Encounter Vitals Group     BP 10/02/23 0256 (!) 213/140     Systolic BP Percentile --      Diastolic BP Percentile --      Pulse Rate 10/02/23 0256 (!) 118     Resp 10/02/23 0256 (!) 22     Temp 10/02/23 0256 100.3 F (37.9 C)     Temp Source 10/02/23 0256 Oral     SpO2 10/02/23 0256 98 %     Weight --      Height --      Head Circumference --      Peak Flow --      Pain Score 10/02/23 0349 0     Pain Loc --      Pain Education --      Exclude from Growth Chart --     Most recent vital signs: Vitals:   10/02/23 0615 10/02/23 0630  BP:  (!) 162/99  Pulse: (!) 109 (!) 111  Resp:    Temp:    SpO2: 94% 93%     General: Awake, interactive  CV:  Tachycardic, good peripheral perfusion Resp:  Unlabored respirations, lungs clear to auscultation Abd:  Nondistended, soft, tender patient diffusely without rebound or guarding Neuro:  Symmetric facial movement, fluid speech   ED Results / Procedures / Treatments   Labs (all labs ordered are listed, but only abnormal results are displayed) Labs Reviewed  RESP PANEL BY RT-PCR (RSV, FLU A&B, COVID)  RVPGX2 - Abnormal; Notable for the following components:      Result Value   Influenza A by PCR POSITIVE (*)    All other components within normal limits  CBC WITH DIFFERENTIAL/PLATELET - Abnormal; Notable for the following components:   Lymphs Abs  0.3 (*)    All other components within normal limits  COMPREHENSIVE METABOLIC PANEL - Abnormal; Notable for the following components:   Sodium 134 (*)    Chloride 96 (*)    Glucose, Bld 150 (*)    Total Protein 8.6 (*)    All other components within normal limits  LIPASE, BLOOD     EKG EKG independently reviewed interpreted by myself (ER attending) demonstrates:    RADIOLOGY Imaging independently reviewed and interpreted by myself demonstrates:  CT abdomen/pelvis without acute abnormality  PROCEDURES:  Critical Care performed: No  Procedures   MEDICATIONS ORDERED IN ED: Medications  ondansetron (ZOFRAN) injection 4 mg (4 mg Intravenous Given 10/02/23 0317)  morphine (PF) 4 MG/ML injection 4 mg (4 mg Intravenous Given 10/02/23 0317)  iohexol (OMNIPAQUE) 300 MG/ML solution 100 mL (100 mLs Intravenous Contrast Given 10/02/23 0413)  sodium chloride 0.9 % bolus 1,000 mL (1,000 mLs Intravenous New Bag/Given 10/02/23 0454)  acetaminophen (TYLENOL) tablet 1,000 mg (1,000 mg Oral Given 10/02/23 0454)     IMPRESSION / MDM / ASSESSMENT AND PLAN / ED COURSE  I reviewed the triage vital signs and the nursing notes.  Differential diagnosis includes, but is not limited to, appendicitis, colitis, viral GI illness, other acute intra-abdominal process  Patient's presentation is most consistent with acute presentation with potential threat to life or bodily function.  68 year old male presenting to the emergency department for evaluation of abdominal pain, nausea, diarrhea.  Tachycardic and hypertensive on presentation.  Labs overall reassuring.  Flu swab is positive.  CT without acute findings.  With IV fluids, Tylenol, pain control, patient had improvement in his heart rate and blood pressure.  He is feeling much improved on reevaluation.  He is comfortable with discharge home.  Declines Tamiflu.  Will DC with Zofran.  Discussed supportive care.  Strict return precautions provided.     FINAL  CLINICAL IMPRESSION(S) / ED DIAGNOSES   Final diagnoses:  Generalized abdominal pain  Influenza A     Rx / DC Orders   ED Discharge Orders          Ordered    ondansetron (ZOFRAN) 4 MG tablet  Every 6 hours PRN        10/02/23 0641             Note:  This document was prepared using Dragon voice recognition software and may include unintentional dictation errors.   Trinna Post, MD 10/02/23 [phone]

## 2023-10-02 NOTE — ED Provider Notes (Signed)
[facility] Provider Note    Event Date/Time   First MD Initiated Contact with Patient 10/02/23 1735     (approximate)   History   Flu+   HPI  Howard Weaver is a 68 y.o. male with history of CAD, CKD, hypertension who was diagnosed with influenza early this morning in our emergency department.  He reports he feels that he is worsened, he feels very weak and is unable to walk because of his generalized weakness.  He feels nauseated also     Physical Exam   Triage Vital Signs: ED Triage Vitals  Encounter Vitals Group     BP 10/02/23 1659 (!) 164/140     Systolic BP Percentile --      Diastolic BP Percentile --      Pulse Rate 10/02/23 1659 (!) 130     Resp 10/02/23 1659 20     Temp 10/02/23 1659 99.7 F (37.6 C)     Temp src --      SpO2 10/02/23 1659 98 %     Weight 10/02/23 1658 99.8 kg (220 lb)     Height 10/02/23 1658 1.753 m (5\' 9" )     Head Circumference --      Peak Flow --      Pain Score 10/02/23 1659 8     Pain Loc --      Pain Education --      Exclude from Growth Chart --     Most recent vital signs: Vitals:   10/02/23 1659  BP: (!) 164/140  Pulse: (!) 130  Resp: 20  Temp: 99.7 F (37.6 C)  SpO2: 98%     General: Awake, no distress.  CV:  Good peripheral perfusion.  Tachycardia Resp:  Normal effort.  Abd:  No distention.  Other:  Normal strength in the lower extremities, no numbness   ED Results / Procedures / Treatments   Labs (all labs ordered are listed, but only abnormal results are displayed) Labs Reviewed  CBC  CK  COMPREHENSIVE METABOLIC PANEL     EKG     RADIOLOGY Patient had CT abdomen pelvis performed morning which was unremarkable    PROCEDURES:  Critical Care performed:   Procedures   MEDICATIONS ORDERED IN ED: Medications  oseltamivir (TAMIFLU) capsule 75 mg (has no administration in time range)  0.9 %  sodium chloride infusion (500 mLs Intravenous New Bag/Given 10/02/23  1843)  ondansetron (ZOFRAN) injection 4 mg (4 mg Intravenous Given 10/02/23 1843)  ketorolac (TORADOL) 30 MG/ML injection 30 mg (30 mg Intravenous Given 10/02/23 1843)     IMPRESSION / MDM / ASSESSMENT AND PLAN / ED COURSE  I reviewed the triage vital signs and the nursing notes. Patient's presentation is most consistent with acute presentation with potential threat to life or bodily function.   Patient with PCR positive influenza A performed early this morning, feeling very weak and is tachycardic here.  Will treat with IV fluids, IV Toradol, IV Zofran and reevaluate  Patient continuing to complain of diffuse weakness after IV fluids and Toradol although body aches are somewhat improved, nausea is improved also  Given that he was seen this morning and returned so quickly will consult the hospitalist for admission     FINAL CLINICAL IMPRESSION(S) / ED DIAGNOSES   Final diagnoses:  Influenza A     Rx / DC Orders   ED Discharge Orders     None  Note:  This document was prepared using Dragon voice recognition software and may include unintentional dictation errors.   Jene Every, MD 10/02/23 2003

## 2023-10-02 NOTE — ED Notes (Signed)
Pt has 20g piv to posterior rt hand not secured appears to be dangling from hand with cannula mostly dislodged Presenter, broadcasting removes PIV and dresses with gauze pt tolerated well

## 2023-10-02 NOTE — ED Triage Notes (Signed)
Pt comes via EMS from home with c/o flu. Pt states he was dx with Flu yesterday and just isn't better. Pt states he should have stayed.

## 2023-10-02 NOTE — H&P (Signed)
History and Physical    Patient: Howard Weaver:[id number] DOB: 1956-02-05 DOA: 10/02/2023 DOS: the patient was seen and examined on 10/02/2023 PCP: Sallyanne Kuster, NP  Patient coming from: Home  Chief Complaint:  Chief Complaint  Patient presents with   Flu+   HPI: Howard Weaver is a 68 y.o. male with medical history significant of hypertension, hyperlipidemia, GERD, CAD, depression who presents to the emergency department due to worsening flu symptoms.  Patient was seen this morning in the ED with complaints of generalized abdominal pain with associated nausea without vomiting, respiratory panel done was positive for influenza.  Morphine was given for abdominal pain, Zofran was given for nausea, patient refused Tamiflu offered, he was discharged home.  He returned this evening with worsening symptoms and complained of generalized weakness with difficulty in being able to walk and ongoing nausea.  ED Course:  In the emergency department, patient was tachycardic with a pulse of 130 bpm, BP was 164/140, other vital signs were within normal range.  Workup in the ED showed normal CBC and BMP except for sodium of 134, chloride 96, blood glucose 150.  Influenza A was positive.  Influenza B, SARS, respiratory, RSV was negative. CT abdomen and pelvis with contrast showed no acute finding.  Review of Systems: Review of systems as noted in the HPI. All other systems reviewed and are negative.   Past Medical History:  Diagnosis Date   Appendicitis 09/24/2021   Bleeding stomach ulcer 2018   Brain aneurysm 2014   CAD (coronary artery disease)    Chest pain    CKD (chronic kidney disease) stage 3, GFR 30-59 ml/min (HCC)    Depression    DU (duodenal ulcer)    DVT of leg (deep venous thrombosis) (HCC) 01/16/2020   GAD (generalized anxiety disorder)    Hyperlipidemia    Hypertension    Myocardial infarction (HCC)    2017 & 2019   Radiculopathy of thoracolumbar region    Stroke  [facility])    2014 & 2021   SVT (supraventricular tachycardia) (HCC)    Tachycardia    Past Surgical History:  Procedure Laterality Date   CARDIAC CATHETERIZATION Right 11/25/2015   Procedure: Left Heart Cath and Coronary Angiography;  Surgeon: Laurier Nancy, MD;  Location: [facility] INVASIVE CV LAB;  Service: Cardiovascular;  Laterality: Right;   CEREBRAL ANEURYSM REPAIR  2014   ESOPHAGOGASTRODUODENOSCOPY  04/12/2014   STOMACH SURGERY     bleeding ulcer repair   TEE WITHOUT CARDIOVERSION Right 01/17/2020   Procedure: TRANSESOPHAGEAL ECHOCARDIOGRAM (TEE);  Surgeon: Laurier Nancy, MD;  Location: [facility] ORS;  Service: Cardiovascular;  Laterality: Right;   XI ROBOTIC LAPAROSCOPIC ASSISTED APPENDECTOMY N/A 11/11/2021   Procedure: XI ROBOTIC LAPAROSCOPIC ASSISTED APPENDECTOMY;  Surgeon: [name], MD;  Location: [facility] ORS;  Service: General;  Laterality: N/A;    Social History:  reports that he quit smoking about 47 years ago. His smoking use included cigarettes. He has never used smokeless tobacco. He reports that he does not currently use alcohol. He reports that he does not use drugs.   Allergies  Allergen Reactions   Penicillins Anaphylaxis    Has patient had a PCN reaction causing immediate rash, facial/tongue/throat swelling, SOB or lightheadedness with hypotension: Yes Has patient had a PCN reaction causing severe rash involving mucus membranes or skin necrosis: Yes Has patient had a PCN reaction that required hospitalization Yes Has patient had a PCN reaction occurring within the last 10 years: No If all  of the above answers are "NO", then may proceed with Cephalosporin use.    Ibuprofen Other (See Comments)    Gi upset    Family History  Problem Relation Age of Onset   Hypertension Mother    Diabetes Mother    Depression Mother    Hypertension Father      Prior to Admission medications   Medication Sig Start Date End Date Taking? Authorizing Provider  aspirin EC 81 MG  tablet Take 1 tablet (81 mg total) by mouth daily. 02/07/23   Sallyanne Kuster, NP  busPIRone (BUSPAR) 15 MG tablet Take 15 mg twice a day 02/07/23   Sallyanne Kuster, NP  chlorthalidone (HYGROTON) 25 MG tablet Take 1 tablet (25 mg total) by mouth daily. 02/07/23   Sallyanne Kuster, NP  clonazePAM (KLONOPIN) 0.5 MG tablet Take(0.25 mg ) 1/2  tablet daily. 02/07/23   Sallyanne Kuster, NP  cloNIDine (CATAPRES) 0.3 MG tablet Take 1 tablet (0.3 mg total) by mouth at bedtime. 02/07/23   Sallyanne Kuster, NP  clopidogrel (PLAVIX) 75 MG tablet Take 1 tablet (75 mg total) by mouth daily. 02/07/23   Sallyanne Kuster, NP  enalapril (VASOTEC) 20 MG tablet Take 2 tablets (40 mg total) by mouth daily. 02/07/23   Sallyanne Kuster, NP  escitalopram (LEXAPRO) 20 MG tablet Take 1 tablet (20 mg total) by mouth daily. 02/07/23   Sallyanne Kuster, NP  HYDROcodone-acetaminophen (NORCO/VICODIN) 5-325 MG tablet Take 1 tablet by mouth every 6 (six) hours as needed for moderate pain. 02/07/23   Sallyanne Kuster, NP  isosorbide mononitrate (IMDUR) 30 MG 24 hr tablet Take 1 tablet (30 mg total) by mouth daily. 02/07/23   Sallyanne Kuster, NP  metoprolol succinate (TOPROL-XL) 100 MG 24 hr tablet Take 1 tablet (100 mg total) by mouth daily. Take with or immediately following a meal. 02/07/23   Abernathy, Arlyss Repress, NP  ondansetron (ZOFRAN) 4 MG tablet Take 1 tablet (4 mg total) by mouth every 6 (six) hours as needed for up to 7 days for nausea or vomiting. 10/02/23 10/09/23  Trinna Post, MD  pantoprazole (PROTONIX) 40 MG tablet Take 1 tablet (40 mg total) by mouth daily. 02/07/23   Sallyanne Kuster, NP  rosuvastatin (CRESTOR) 40 MG tablet Take 1 tablet (40 mg total) by mouth daily. 02/07/23   Sallyanne Kuster, NP    Physical Exam: BP (!) 129/95   Pulse 91   Temp 98.3 F (36.8 C) (Oral)   Resp 15   Ht 5\' 9"  (1.753 m)   Wt 99.8 kg   SpO2 95%   BMI 32.49 kg/m   General: 68 y.o. year-old male well developed well nourished in no  acute distress.  Alert and oriented x3. HEENT: NCAT, EOMI Neck: Supple, trachea medial Cardiovascular: Regular rate and rhythm with no rubs or gallops.  No thyromegaly or JVD noted.  No lower extremity edema. 2/4 pulses in all 4 extremities. Respiratory: Clear to auscultation with no wheezes or rales. Good inspiratory effort. Abdomen: Soft, nontender nondistended with normal bowel sounds x4 quadrants. Muskuloskeletal: No cyanosis, clubbing or edema noted bilaterally Neuro: CN II-XII intact, strength 5/5 x 4, sensation, reflexes intact Skin: No ulcerative lesions noted or rashes Psychiatry: Judgement and insight appear normal. Mood is appropriate for condition and setting          Labs on Admission:  Basic Metabolic Panel: Recent Labs  Lab 10/02/23 0312  NA 134*  K 4.3  CL 96*  CO2 25  GLUCOSE 150*  BUN 15  CREATININE  1.22  CALCIUM 9.9   Liver Function Tests: Recent Labs  Lab 10/02/23 0312  AST 20  ALT 18  ALKPHOS 79  BILITOT 0.7  PROT 8.6*  ALBUMIN 4.2   Recent Labs  Lab 10/02/23 0312  LIPASE 25   No results for input(s): "AMMONIA" in the last 168 hours. CBC: Recent Labs  Lab 10/02/23 0312 10/02/23 1836  WBC 8.1 6.5  NEUTROABS 7.0  --   HGB 15.7 16.1  HCT 47.6 48.7  MCV 85.0 83.8  PLT 201 188   Cardiac Enzymes: No results for input(s): "CKTOTAL", "CKMB", "CKMBINDEX", "TROPONINI" in the last 168 hours.  BNP (last 3 results) No results for input(s): "BNP" in the last 8760 hours.  ProBNP (last 3 results) No results for input(s): "PROBNP" in the last 8760 hours.  CBG: No results for input(s): "GLUCAP" in the last 168 hours.  Radiological Exams on Admission: CT ABDOMEN PELVIS W CONTRAST Result Date: 10/02/2023 CLINICAL DATA:  Acute, nonlocalized abdominal pain EXAM: CT ABDOMEN AND PELVIS WITH CONTRAST TECHNIQUE: Multidetector CT imaging of the abdomen and pelvis was performed using the standard protocol following bolus administration of intravenous  contrast. RADIATION DOSE REDUCTION: This exam was performed according to the departmental dose-optimization program which includes automated exposure control, adjustment of the mA and/or kV according to patient size and/or use of iterative reconstruction technique. CONTRAST:  OMNIPAQUE IOHEXOL 300 MG/ML  SOLN COMPARISON:  04/15/2023 FINDINGS: Lower chest: No acute finding. Coronary atherosclerosis. 6 mm nodule in the right lower lung near the minor fissure, stable from 05/01/2014 chest CT and benign. Hepatobiliary: No focal liver abnormality.No evidence of biliary obstruction or stone. Pancreas: Unremarkable. Spleen: Unremarkable. Adrenals/Urinary Tract: Negative adrenals. No hydronephrosis or stone. Small simple cysts in the bilateral upper pole kidney. Unremarkable bladder. Stomach/Bowel: No obstruction. Appendectomy. No visible bowel wall thickening or other sign of inflammation. Vascular/Lymphatic: No acute vascular abnormality. Atheromatous calcification of the aorta, iliacs, and visceral branches. No mass or adenopathy. Reproductive:No pathologic findings. Other: No ascites or pneumoperitoneum. Musculoskeletal: No acute abnormalities. IMPRESSION: 1. No acute finding . 2. Atherosclerosis including the coronary arteries. Electronically Signed   By: Tiburcio Pea M.D.   On: 10/02/2023 04:49    EKG: I independently viewed the EKG done and my findings are as followed: Sinus rhythm with PACs with aberrant conduction at a rate of 89 bpm  Assessment/Plan Present on Admission:  Influenza A  Essential hypertension  Mixed hyperlipidemia  Coronary artery disease due to lipid rich plaque  Principal Problem:   Influenza A Active Problems:   Coronary artery disease due to lipid rich plaque   Essential hypertension   Mixed hyperlipidemia   GERD (gastroesophageal reflux disease)   Depression  Influenza A Continue Tamiflu Continue IV hydration Continue Tylenol as needed Continue Zofran as needed  for nausea/vomiting  Essential hypertension Continue Toprol-XL, enalapril  Mixed hyperlipidemia Continue Crestor  GERD Continue Protonix  CAD Continue aspirin, Crestor, Toprol-XL  Depression Continue Lexapro  DVT prophylaxis: Lovenox  Code Status: Full code  Family Communication: None at bedside  Consults: None at bedside  Severity of Illness: The appropriate patient status for this patient is INPATIENT. Inpatient status is judged to be reasonable and necessary in order to provide the required intensity of service to ensure the patient's safety. The patient's presenting symptoms, physical exam findings, and initial radiographic and laboratory data in the context of their chronic comorbidities is felt to place them at high risk for further clinical deterioration. Furthermore, it is not  anticipated that the patient will be medically stable for discharge from the hospital within 2 midnights of admission.   * I certify that at the point of admission it is my clinical judgment that the patient will require inpatient hospital care spanning beyond 2 midnights from the point of admission due to high intensity of service, high risk for further deterioration and high frequency of surveillance required.*  Author: Frankey Shown, DO 10/02/2023 9:39 PM  For on call review [email or web address].

## 2023-10-02 NOTE — ED Notes (Addendum)
Pt order for Tamiflu not available Presenter, broadcasting contacts pharmacy to send per pharmacy will send med pt provided apple juice per request pt watching television and changed into a gown pt origins/hx unknown to Presenter, broadcasting @ this time as report is not provided by primary sending ED  [employer]

## 2023-10-02 NOTE — Progress Notes (Signed)
Anticoagulation monitoring(Lovenox):  69 yo  male ordered Lovenox 40 mg Q24h    Filed Weights   10/02/23 1658  Weight: 99.8 kg (220 lb)   BMI 32   Lab Results  Component Value Date   CREATININE 1.22 10/02/2023   CREATININE 1.20 07/22/2023   CREATININE 1.22 04/14/2023   Estimated Creatinine Clearance: 67.5 mL/min (by C-G formula based on SCr of 1.22 mg/dL). Hemoglobin & Hematocrit     Component Value Date/Time   HGB 16.1 10/02/2023 1836   HGB 15.4 08/14/2019 0831   HCT 48.7 10/02/2023 1836   HCT 46.9 08/14/2019 0831     Per Protocol for Patient with estCrcl > 30 ml/min and BMI > 30, will transition to Lovenox 50 mg Q24h.

## 2023-10-03 ENCOUNTER — Inpatient Hospital Stay: Payer: Medicare HMO

## 2023-10-03 DIAGNOSIS — I2583 Coronary atherosclerosis due to lipid rich plaque: Secondary | ICD-10-CM

## 2023-10-03 DIAGNOSIS — I2510 Atherosclerotic heart disease of native coronary artery without angina pectoris: Secondary | ICD-10-CM | POA: Diagnosis not present

## 2023-10-03 DIAGNOSIS — J101 Influenza due to other identified influenza virus with other respiratory manifestations: Secondary | ICD-10-CM | POA: Diagnosis not present

## 2023-10-03 DIAGNOSIS — I10 Essential (primary) hypertension: Secondary | ICD-10-CM | POA: Diagnosis not present

## 2023-10-03 DIAGNOSIS — E782 Mixed hyperlipidemia: Secondary | ICD-10-CM | POA: Diagnosis not present

## 2023-10-03 LAB — CBG MONITORING, ED: Glucose-Capillary: 128 mg/dL — ABNORMAL HIGH (ref 70–99)

## 2023-10-03 LAB — COMPREHENSIVE METABOLIC PANEL
ALT: 15 U/L (ref 0–44)
ALT: 16 U/L (ref 0–44)
AST: 20 U/L (ref 15–41)
AST: 21 U/L (ref 15–41)
Albumin: 3.4 g/dL — ABNORMAL LOW (ref 3.5–5.0)
Albumin: 3.4 g/dL — ABNORMAL LOW (ref 3.5–5.0)
Alkaline Phosphatase: 52 U/L (ref 38–126)
Alkaline Phosphatase: 56 U/L (ref 38–126)
Anion gap: 11 (ref 5–15)
Anion gap: 12 (ref 5–15)
BUN: 25 mg/dL — ABNORMAL HIGH (ref 8–23)
BUN: 25 mg/dL — ABNORMAL HIGH (ref 8–23)
CO2: 26 mmol/L (ref 22–32)
CO2: 26 mmol/L (ref 22–32)
Calcium: 9 mg/dL (ref 8.9–10.3)
Calcium: 9.1 mg/dL (ref 8.9–10.3)
Chloride: 100 mmol/L (ref 98–111)
Chloride: 101 mmol/L (ref 98–111)
Creatinine, Ser: 1.76 mg/dL — ABNORMAL HIGH (ref 0.61–1.24)
Creatinine, Ser: 1.79 mg/dL — ABNORMAL HIGH (ref 0.61–1.24)
GFR, Estimated: 41 mL/min — ABNORMAL LOW (ref >60)
GFR, Estimated: 42 mL/min — ABNORMAL LOW (ref >60)
Glucose, Bld: 95 mg/dL (ref 70–99)
Glucose, Bld: 96 mg/dL (ref 70–99)
Potassium: 3.8 mmol/L (ref 3.5–5.1)
Potassium: 3.8 mmol/L (ref 3.5–5.1)
Sodium: 138 mmol/L (ref 135–145)
Sodium: 138 mmol/L (ref 135–145)
Total Bilirubin: 0.6 mg/dL (ref 0.0–1.2)
Total Bilirubin: 0.7 mg/dL (ref 0.0–1.2)
Total Protein: 7.1 g/dL (ref 6.5–8.1)
Total Protein: 7.1 g/dL (ref 6.5–8.1)

## 2023-10-03 LAB — CBC
HCT: 45.2 % (ref 39.0–52.0)
Hemoglobin: 14.5 g/dL (ref 13.0–17.0)
MCH: 27.7 pg (ref 26.0–34.0)
MCHC: 32.1 g/dL (ref 30.0–36.0)
MCV: 86.3 fL (ref 80.0–100.0)
Platelets: 159 10*3/uL (ref 150–400)
RBC: 5.24 MIL/uL (ref 4.22–5.81)
RDW: 14.1 % (ref 11.5–15.5)
WBC: 4.4 10*3/uL (ref 4.0–10.5)
nRBC: 0 % (ref 0.0–0.2)

## 2023-10-03 LAB — MAGNESIUM: Magnesium: 2 mg/dL (ref 1.7–2.4)

## 2023-10-03 LAB — HIV ANTIBODY (ROUTINE TESTING W REFLEX): HIV Screen 4th Generation wRfx: NONREACTIVE

## 2023-10-03 LAB — GLUCOSE, CAPILLARY: Glucose-Capillary: 142 mg/dL — ABNORMAL HIGH (ref 70–99)

## 2023-10-03 LAB — CK: Total CK: 313 U/L (ref 49–397)

## 2023-10-03 MED ORDER — ESCITALOPRAM OXALATE 10 MG PO TABS
20.0000 mg | ORAL_TABLET | Freq: Every day | ORAL | Status: DC
Start: 2023-10-03 — End: 2023-10-05
  Administered 2023-10-03 – 2023-10-05 (×3): 20 mg via ORAL
  Filled 2023-10-03 (×3): qty 2

## 2023-10-03 MED ORDER — CLONIDINE HCL 0.1 MG PO TABS
0.3000 mg | ORAL_TABLET | Freq: Every day | ORAL | Status: DC
  Administered 2023-10-03 – 2023-10-05 (×3): 0.3 mg via ORAL
  Filled 2023-10-03 (×3): qty 3

## 2023-10-03 MED ORDER — ASPIRIN 81 MG PO TBEC
81.0000 mg | DELAYED_RELEASE_TABLET | Freq: Every day | ORAL | Status: DC
  Administered 2023-10-04 – 2023-10-05 (×2): 81 mg via ORAL
  Filled 2023-10-03 (×2): qty 1

## 2023-10-03 MED ORDER — ISOSORBIDE MONONITRATE ER 30 MG PO TB24
30.0000 mg | ORAL_TABLET | Freq: Every day | ORAL | Status: DC
  Administered 2023-10-03 – 2023-10-05 (×3): 30 mg via ORAL
  Filled 2023-10-03 (×3): qty 1

## 2023-10-03 MED ORDER — CHLORTHALIDONE 25 MG PO TABS
25.0000 mg | ORAL_TABLET | Freq: Every day | ORAL | Status: DC
  Administered 2023-10-03 – 2023-10-04 (×2): 25 mg via ORAL
  Filled 2023-10-03 (×2): qty 1

## 2023-10-03 MED ORDER — ENALAPRIL MALEATE 20 MG PO TABS
40.0000 mg | ORAL_TABLET | Freq: Every day | ORAL | Status: DC
  Administered 2023-10-03: 40 mg via ORAL
  Filled 2023-10-03: qty 4
  Filled 2023-10-03: qty 2

## 2023-10-03 MED ORDER — ROSUVASTATIN CALCIUM 20 MG PO TABS
40.0000 mg | ORAL_TABLET | Freq: Every day | ORAL | Status: DC
  Administered 2023-10-04 – 2023-10-05 (×2): 40 mg via ORAL
  Filled 2023-10-03 (×2): qty 2

## 2023-10-03 MED ORDER — PANTOPRAZOLE SODIUM 40 MG PO TBEC
40.0000 mg | DELAYED_RELEASE_TABLET | Freq: Every day | ORAL | Status: DC
  Administered 2023-10-03 – 2023-10-05 (×3): 40 mg via ORAL
  Filled 2023-10-03 (×3): qty 1

## 2023-10-03 MED ORDER — METOPROLOL SUCCINATE ER 50 MG PO TB24
100.0000 mg | ORAL_TABLET | Freq: Every day | ORAL | Status: DC
Start: 2023-10-03 — End: 2023-10-05
  Administered 2023-10-03 – 2023-10-05 (×3): 100 mg via ORAL
  Filled 2023-10-03 (×3): qty 2

## 2023-10-03 NOTE — Assessment & Plan Note (Signed)
 Continue Protonix

## 2023-10-03 NOTE — Progress Notes (Signed)
Chaplain provided Howard Weaver spiritual care as he had several events this evening. He reports he has a history of decline. His son is his local support. He desired prayer which chaplain offered. He reported no other needs. Spiritual Care available upon request 24/7.    10/03/23 2000  Spiritual Encounters  Type of Visit Initial  Care provided to: Patient  Conversation partners present during encounter Nurse  Referral source Code page  Reason for visit Code  OnCall Visit Yes

## 2023-10-03 NOTE — Assessment & Plan Note (Signed)
 Continue home Crestor

## 2023-10-03 NOTE — Hospital Course (Addendum)
Taken from H&P.   Howard Weaver is a 68 y.o. male with medical history significant of hypertension, hyperlipidemia, GERD, CAD, depression who presents to the emergency department due to worsening flu symptoms.  Patient was seen this morning in the ED with complaints of generalized abdominal pain with associated nausea without vomiting, respiratory panel done was positive for influenza.  Morphine was given for abdominal pain, Zofran was given for nausea, patient refused Tamiflu offered, he was discharged home.  He returned this evening with worsening symptoms and complained of generalized weakness with difficulty in being able to walk and ongoing nausea.   On presentation patient was little tachycardic, labs mostly unremarkable.  Influenza A was positive. CT abdominal pelvis with no acute abnormalities. Started on Tamiflu.  2/3: Vital stable on room air, chest x-ray with atelectasis and no other acute abnormality.  PT evaluation ordered as he does not think that he can go home safely due to weakness.  2/4: Patient had 2 very weird syncopal type episodes, vitals remained stable and he recovered pretty quickly.  Per patient he had similar episodes prior whenever he quickly stands up.  Orthostatic vitals were checked, they did show some drop but never met criteria for positive orthostatic vitals.  No chest pain.  Echocardiogram was ordered and pending Giving some gentle IV fluid PT is recommending home health

## 2023-10-03 NOTE — Progress Notes (Signed)
Physical Therapy Evaluation Patient Details Name: Howard Weaver MRN: [id number] DOB: 12/19/1955 Today's Date: 10/03/2023  History of Present Illness  Howard Weaver is a 68 y.o. male with history of SVT, PVD, acute ischemic multifocal multiple vascular territories stroke, brain aneurysm s/p clipping in [place], CAD, GAD, CKD stage III, HTN, and dizziness who was diagnosed with influenza early this morning in our emergency department. He reports he feels that he is worsened, he feels very weak and is unable to walk because of his generalized weakness.   Clinical Impression  Chart Reviewed. Orders Received. Patient supine in bed upon arrival, agreeable to PT evaluation. Son is at bedside. Per patient reports, prior to admission was IND with mobility and ADLs. Intermittent use of SPC. Reports increasing weakness over last few days. Patient lives in single level home with level entry. Patient reports he lives alone, son reports he lives with patient? Unsure of validity. But son does state he can provide 24 hr assist if needed at discharge.   On evaluation, patient was MOD I with bed mobility, able to stand from EOB with close supv/CGA with RW. Increased reliance on RW. With standing, BP significantly elevated: 175/145. Therefore returned to seated position, and provided rest break. BP with no improvements, with reassessment after rest break, BP: 195/122. All further activity terminated due to BP concerns, RN notified immediately. Anticipate ability to progress ambulation with RW once BP is stable. Patient left with all needs in reach and son remains at bedside. Patient will benefit from skilled acute PT services to address functional impairments (see below for additional) and maximize functional mobility. Anticipate the need for follow up PT services upon acute hospital discharge. Will continue to follow acutely.        If plan is discharge home, recommend the following: A little help with walking and/or  transfers;A little help with bathing/dressing/bathroom;Assist for transportation;Help with stairs or ramp for entrance   Can travel by private vehicle        Equipment Recommendations Rolling walker (2 wheels)  Recommendations for Other Services       Functional Status Assessment Patient has had a recent decline in their functional status and demonstrates the ability to make significant improvements in function in a reasonable and predictable amount of time.     Precautions / Restrictions Precautions Precautions: Fall Restrictions Weight Bearing Restrictions Per Provider Order: No      Mobility  Bed Mobility Overal bed mobility: Modified Independent             General bed mobility comments: Patient able to complete supine > sit; no bed rails Mod I. increased time required. Normal seated balance at EOB.    Transfers Overall transfer level: Needs assistance Equipment used: Rolling walker (2 wheels) Transfers: Sit to/from Stand Sit to Stand: Supervision           General transfer comment: Pt able to stand from EOB with RW, CGA/close supervision. BP monitored with activity. After standing attempt: 175/145. Patient with seated rest break for approx 2-3 minutes. BP: 195/122. Activity terminated due to no improvements in BP with rest break. RN notified immediately.    Ambulation/Gait               General Gait Details: Gait unable to be safely attempted due to significantly elevated BP at time of evaluation. Based of his ability to stand and march in palce, anticipate ability to progress gait when medically stable from BP perspective.  Stairs  Wheelchair Mobility     Tilt Bed    Modified Rankin (Stroke Patients Only)       Balance Overall balance assessment: Needs assistance Sitting-balance support: Feet supported, No upper extremity supported Sitting balance-Leahy Scale: Normal     Standing balance support: Bilateral upper extremity  supported, During functional activity Standing balance-Leahy Scale: Fair Standing balance comment: Patient increase reliance on RW; close supv/CGA steadying assist                             Pertinent Vitals/Pain Pain Assessment Pain Assessment: 0-10 Pain Score: 6  Pain Location: Low Back Pain Descriptors / Indicators: Aching Pain Intervention(s): Limited activity within patient's tolerance, Monitored during session    Home Living Family/patient expects to be discharged to:: Private residence Living Arrangements: Alone;Other (Comment) (Patient reports lives alone; Son present in session and reports he lives with patient and can provide assist) Available Help at Discharge: Family;Available 24 hours/day Type of Home: House Home Access: Level entry       Home Layout: One level Home Equipment: Cane - single point      Prior Function Prior Level of Function : Independent/Modified Independent             Mobility Comments: IND with mobility; has SPC that uses occasionally. Patient reports recent increase in use of SPC due to weakness. Reports three falls. ADLs Comments: IND with ADLs; Not Driving     Extremity/Trunk Assessment   Upper Extremity Assessment Upper Extremity Assessment: Overall WFL for tasks assessed    Lower Extremity Assessment Lower Extremity Assessment: Generalized weakness       Communication   Communication Communication: No apparent difficulties  Cognition Arousal: Alert Behavior During Therapy: Anxious, Impulsive Overall Cognitive Status: Within Functional Limits for tasks assessed                                 General Comments: A&O        General Comments General comments (skin integrity, edema, etc.): PT educating on need for therapy follow up services upon discharge.Son hesistant to have patient go to rehab.    Exercises     Assessment/Plan    PT Assessment Patient needs continued PT services  PT  Problem List Decreased strength;Decreased activity tolerance;Decreased balance;Decreased mobility;Decreased safety awareness       PT Treatment Interventions DME instruction;Gait training;Functional mobility training;Therapeutic activities;Therapeutic exercise;Balance training;Neuromuscular re-education    PT Goals (Current goals can be found in the Care Plan section)  Acute Rehab PT Goals Patient Stated Goal: Get Home PT Goal Formulation: With patient Time For Goal Achievement: 10/17/23 Potential to Achieve Goals: Fair    Frequency Min 1X/week     Co-evaluation               AM-PAC PT "6 Clicks" Mobility  Outcome Measure Help needed turning from your back to your side while in a flat bed without using bedrails?: None Help needed moving from lying on your back to sitting on the side of a flat bed without using bedrails?: None Help needed moving to and from a bed to a chair (including a wheelchair)?: None Help needed standing up from a chair using your arms (e.g., wheelchair or bedside chair)?: A Little Help needed to walk in hospital room?: A Lot Help needed climbing 3-5 steps with a railing? : A Lot 6 Click Score: 19  End of Session Equipment Utilized During Treatment: Gait belt Activity Tolerance: Other (comment) (treatment limited secondary to elevated BP readings) Patient left: in bed;with call bell/phone within reach Nurse Communication: Mobility status PT Visit Diagnosis: Unsteadiness on feet (R26.81);History of falling (Z91.81);Muscle weakness (generalized) (M62.81);Difficulty in walking, not elsewhere classified (R26.2)    Time: 1610-9604 PT Time Calculation (min) (ACUTE ONLY): 16 min   Charges:   PT Evaluation $PT Eval Low Complexity: 1 Low   PT General Charges $$ ACUTE PT VISIT: 1 Visit        Creed Copper Fairly, PT, DPT 10/03/23 1:22 PM

## 2023-10-03 NOTE — ED Notes (Signed)
Patient has removed monitoring equipment again. Reeducated patient of importance of monitoring.

## 2023-10-03 NOTE — Progress Notes (Signed)
Rapid Response Event Note   Reason for Call : syncopal episode, less responsive   Initial Focused Assessment: On my arrival pt is lying in bed, no s/s of distress noted. Pt is alert and oriented and can answer all questions appropriately. Pt has just arrived to room from ED as an admit and ED RN states that pt became less responsive and slumped in wheelchair on arrival to unit. This had also happened in ED earlier and code blue was called and then canceled. Pt states he does have a history of seizures. All VSS at this time on room air. BP 123/86, HR 64, SPO2 96%. Pt states he is feeling better now with no dizziness.   Interventions: Dr. Nelson Chimes notified of episode and notified of all vitals. MD wants telemetry monitoring and to encourage hydration. No other orders at this time.   Plan of Care: Pt to remain in 114 on telemetry monitoring. Will follow up with night shift RN.    Event Summary:   MD Notified: Dr. Nelson Chimes Call Time: 1839 Arrival Time: 1841 End Time: 1914  Henrene Dodge, RN

## 2023-10-03 NOTE — ED Notes (Addendum)
Howard Weaver, EDT was going to transport patient up to inpatient room when he become less responsive. This RN, Metz, Edon and [place] all moved patient from wheelchair to bed. Patient responsive and able to answer questions being A&Ox4. Patient CBG 128, BP 137/99, HR 74, 95% on RA. Patient BP were very high earlier and Informed MD Amin and stated to take patient upstairs. Also informed nursing taking over care of patient upstairs. Transport has been called.

## 2023-10-03 NOTE — ED Notes (Signed)
Patient continuously taking off monitoring equipment educated patient need to keep monitoring equipment on especially since BP is running high.

## 2023-10-03 NOTE — Assessment & Plan Note (Signed)
 Continue Lexapro

## 2023-10-03 NOTE — ED Notes (Signed)
 PT at bedside.

## 2023-10-03 NOTE — Assessment & Plan Note (Signed)
No chest pain. -Continue home Toprol, Crestor and aspirin

## 2023-10-03 NOTE — ED Notes (Signed)
Pt appears to be resting quietly denies needs @ this time television on

## 2023-10-03 NOTE — Assessment & Plan Note (Signed)
-  Continue home Toprol and enalapril

## 2023-10-03 NOTE — Assessment & Plan Note (Signed)
On room air, chest x-ray without any significant abnormality. -Continue with Tamiflu -Continue with supportive care -PT evaluation for concern of worsening weakness

## 2023-10-03 NOTE — Progress Notes (Signed)
  Progress Note   Patient: Howard Weaver:[id number] DOB: 04/24/1956 DOA: 10/02/2023     1 DOS: the patient was seen and examined on 10/03/2023   Brief hospital course: Taken from H&P.   Howard Weaver is a 68 y.o. male with medical history significant of hypertension, hyperlipidemia, GERD, CAD, depression who presents to the emergency department due to worsening flu symptoms.  Patient was seen this morning in the ED with complaints of generalized abdominal pain with associated nausea without vomiting, respiratory panel done was positive for influenza.  Morphine was given for abdominal pain, Zofran was given for nausea, patient refused Tamiflu offered, he was discharged home.  He returned this evening with worsening symptoms and complained of generalized weakness with difficulty in being able to walk and ongoing nausea.   On presentation patient was little tachycardic, labs mostly unremarkable.  Influenza A was positive. CT abdominal pelvis with no acute abnormalities. Started on Tamiflu.  2/3: Vital stable on room air, chest x-ray with atelectasis and no other acute abnormality.  PT evaluation ordered as he does not think that he can go home safely due to weakness.    Assessment and Plan: * Influenza A On room air, chest x-ray without any significant abnormality. -Continue with Tamiflu -Continue with supportive care -PT evaluation for concern of worsening weakness  Essential hypertension -Continue home Toprol and enalapril  Coronary artery disease due to lipid rich plaque No chest pain. -Continue home Toprol, Crestor and aspirin  Mixed hyperlipidemia -Continue home Crestor  Depression -Continue Lexapro  GERD (gastroesophageal reflux disease) -Continue Protonix   Subjective: Patient was sitting at the edge of the bed when seen today.  Keep saying that I am very weak to go home.  Not much upper respiratory symptoms just generalized weakness.  Physical Exam: Vitals:    10/03/23 0000 10/03/23 0038 10/03/23 0653 10/03/23 0922  BP: 111/67  111/67 (!) 167/130  Pulse: 72  72 86  Resp:  15 15 17   Temp:  98.3 F (36.8 C) 98.3 F (36.8 C)   TempSrc:  Oral Oral   SpO2: 98%  98% 95%  Weight:      Height:       General.  Overweight gentleman, in no acute distress. Pulmonary.  Lungs clear bilaterally, normal respiratory effort. CV.  Regular rate and rhythm, no JVD, rub or murmur. Abdomen.  Soft, nontender, nondistended, BS positive. CNS.  Alert and oriented .  No focal neurologic deficit. Extremities.  No edema, no cyanosis, pulses intact and symmetrical. Psychiatry.  Judgment and insight appears normal.   Data Reviewed: Prior data reviewed  Family Communication: Discussed with patient  Disposition: Status is: Inpatient Remains inpatient appropriate because: Severity of illness  Planned Discharge Destination: Home  Time spent: 44 minutes  This record has been created using Conservation officer, historic buildings. Errors have been sought and corrected,but may not always be located. Such creation errors do not reflect on the standard of care.   Author: Arnetha Courser, MD 10/03/2023 12:47 PM  For on call review [email or web address].

## 2023-10-04 DIAGNOSIS — R55 Syncope and collapse: Secondary | ICD-10-CM

## 2023-10-04 DIAGNOSIS — J101 Influenza due to other identified influenza virus with other respiratory manifestations: Secondary | ICD-10-CM | POA: Diagnosis not present

## 2023-10-04 DIAGNOSIS — I10 Essential (primary) hypertension: Secondary | ICD-10-CM | POA: Diagnosis not present

## 2023-10-04 DIAGNOSIS — N179 Acute kidney failure, unspecified: Secondary | ICD-10-CM | POA: Diagnosis not present

## 2023-10-04 LAB — CREATININE, SERUM
Creatinine, Ser: 1.64 mg/dL — ABNORMAL HIGH (ref 0.61–1.24)
GFR, Estimated: 45 mL/min — ABNORMAL LOW (ref >60)

## 2023-10-04 MED ORDER — LACTATED RINGERS IV SOLN: INTRAVENOUS | Status: AC

## 2023-10-04 MED ORDER — ENALAPRIL MALEATE 10 MG PO TABS
40.0000 mg | ORAL_TABLET | Freq: Every day | ORAL | Status: DC
  Administered 2023-10-04: 40 mg via ORAL
  Filled 2023-10-04: qty 4

## 2023-10-04 MED ORDER — OSELTAMIVIR PHOSPHATE 30 MG PO CAPS
30.0000 mg | ORAL_CAPSULE | Freq: Two times a day (BID) | ORAL | Status: DC
  Administered 2023-10-04: 30 mg via ORAL
  Filled 2023-10-04 (×2): qty 1

## 2023-10-04 NOTE — Assessment & Plan Note (Signed)
Blood pressure within goal. -Continue home Toprol and clonidine -Holding home enalapril and chlorthalidone for concern of AKI

## 2023-10-04 NOTE — Assessment & Plan Note (Signed)
Patient developed AKI yesterday, likely due to poor p.o. intake. Improving creatinine. -Continue to monitor -Avoid nephrotoxins

## 2023-10-04 NOTE — Progress Notes (Signed)
 Progress Note   Patient: Howard Weaver FMW:[id number] DOB: Jul 09, 1956 DOA: 10/02/2023     2 DOS: the patient was seen and examined on 10/04/2023   Brief hospital course: Taken from H&P.   Howard Weaver is a 68 y.o. male with medical history significant of hypertension, hyperlipidemia, GERD, CAD, depression who presents to the emergency department due to worsening flu symptoms.  Patient was seen this morning in the ED with complaints of generalized abdominal pain with associated nausea without vomiting, respiratory panel done was positive for influenza.  Morphine  was given for abdominal pain, Zofran  was given for nausea, patient refused Tamiflu  offered, he was discharged home.  He returned this evening with worsening symptoms and complained of generalized weakness with difficulty in being able to walk and ongoing nausea.   On presentation patient was little tachycardic, labs mostly unremarkable.  Influenza A was positive. CT abdominal pelvis with no acute abnormalities. Started on Tamiflu .  2/3: Vital stable on room air, chest x-ray with atelectasis and no other acute abnormality.  PT evaluation ordered as he does not think that he can go home safely due to weakness.  2/4: Patient had 2 very weird syncopal type episodes, vitals remained stable and he recovered pretty quickly.  Per patient he had similar episodes prior whenever he quickly stands up.  Orthostatic vitals were checked, they did show some drop but never met criteria for positive orthostatic vitals.  No chest pain.  Echocardiogram was ordered and pending Giving some gentle IV fluid PT is recommending home health   Assessment and Plan: * Influenza A On room air, chest x-ray without any significant abnormality. -Continue with Tamiflu  -Continue with supportive care -PT evaluation for concern of worsening weakness  Syncope Patient had 2 short and weird syncopal episodes-rapid response was called.  Episodes occur during  transportation from ED to floor and 1 after the bowel movement.  Orthostatic vitals with some decline but never met criteria for positive orthostasis. No chest pain and vitals remained stable.  No arrhythmia noted -Echocardiogram ordered -Keep him on telemetry  Essential hypertension Blood pressure within goal. -Continue home Toprol  and clonidine  -Holding home enalapril  and chlorthalidone  for concern of AKI  AKI (acute kidney injury) (HCC) Patient developed AKI yesterday, likely due to poor p.o. intake. Improving creatinine. -Continue to monitor -Avoid nephrotoxins  Coronary artery disease due to lipid rich plaque No chest pain. -Continue home Toprol , Crestor  and aspirin   Mixed hyperlipidemia -Continue home Crestor   Depression -Continue Lexapro   GERD (gastroesophageal reflux disease) -Continue Protonix    Subjective: Patient was still feeling very weak.  Denies any chest pain or shortness of breath or any presyncopal feeling.  Per patient he had this type of episodes couple of time at home when he suddenly stands up.  Physical Exam: Vitals:   10/04/23 0757 10/04/23 1324 10/04/23 1327 10/04/23 1329  BP: (!) 136/92 (!) 132/90 (!) 123/95 114/78  Pulse: 65 66 68 87  Resp: 18     Temp:      TempSrc:      SpO2: 94%     Weight:      Height:       General.  Well-developed gentleman, in no acute distress. Pulmonary.  Lungs clear bilaterally, normal respiratory effort. CV.  Regular rate and rhythm, no JVD, rub or murmur. Abdomen.  Soft, nontender, nondistended, BS positive. CNS.  Alert and oriented .  No focal neurologic deficit. Extremities.  No edema, no cyanosis, pulses intact and symmetrical. Psychiatry.  Judgment  and insight appears normal.   Data Reviewed: Prior data reviewed  Family Communication: Discussed with patient  Disposition: Status is: Inpatient Remains inpatient appropriate because: Severity of illness  Planned Discharge Destination: Home  Time  spent: 50 minutes  This record has been created using Conservation officer, historic buildings. Errors have been sought and corrected,but may not always be located. Such creation errors do not reflect on the standard of care.   Author: Amaryllis Dare, MD 10/04/2023 3:43 PM  For on call review [email or web address].

## 2023-10-04 NOTE — Discharge Instructions (Signed)
 Your nurse navigator, Latamara Melder, can be reached at [phone]

## 2023-10-04 NOTE — Progress Notes (Signed)
 Physical Therapy Treatment Patient Details Name: Howard Weaver MRN: [id number] DOB: 1956-03-07 Today's Date: 10/04/2023   History of Present Illness Howard Weaver is a 68 y.o. male with history of SVT, PVD, acute ischemic multifocal multiple vascular territories stroke, brain aneurysm s/p clipping in [place], CAD, GAD, CKD stage III, HTN, and dizziness who was diagnosed with influenza early this morning in our emergency department. He reports he feels that he is worsened, he feels very weak and is unable to walk because of his generalized weakness.    PT Comments  Pt was pleasant and motivated to participate during the session and put forth good effort throughout. Upon entering room pt found beginning to amb back to bed from the BR having gotten up to urinate without use of RW or staff assist.  Pt notably unsteady/drifting left/right as he began to ambulate with gait belt quickly placed on pt and pt assisted back to sitting at the EOB which set off the bed alarm, nursing notified.  Orthostatic BPs taken per MD request as follows:  Supine: 117/84, HR 63, sitting: 111/82, HR 79, and standing: 102/77, HR 80, asymptomatic throughout.  Pt then ambulated with a RW 100+ doing laps in the room with much improved stability compared to without the RW but did require cues for general safe use of the RW most notably during sharp turns.  Pt will benefit from continued PT services upon discharge to safely address deficits listed in patient problem list for decreased caregiver assistance and eventual return to PLOF.           If plan is discharge home, recommend the following: A little help with walking and/or transfers;A little help with bathing/dressing/bathroom;Assist for transportation;Help with stairs or ramp for entrance;Assistance with cooking/housework   Can travel by Training And Development Officer (2 wheels)    Recommendations for Other Services       Precautions  / Restrictions Precautions Precautions: Fall Restrictions Weight Bearing Restrictions Per Provider Order: No     Mobility  Bed Mobility Overal bed mobility: Modified Independent             General bed mobility comments: Min extra time and effort only    Transfers Overall transfer level: Needs assistance Equipment used: Rolling walker (2 wheels) Transfers: Sit to/from Stand Sit to Stand: Supervision           General transfer comment: Good control and stability with min verbal cues for hand placement    Ambulation/Gait Ambulation/Gait assistance: Contact guard assist Gait Distance (Feet): 100 Feet Assistive device: Rolling walker (2 wheels), None Gait Pattern/deviations: Step-through pattern, Decreased step length - right, Decreased step length - left, Drifts right/left Gait velocity: decreased     General Gait Details: Min instability and drifting left/right during limited ambulation without an AD, much improved with the RW with slow cadence and short B step length but no overt LOB   Stairs             Wheelchair Mobility     Tilt Bed    Modified Rankin (Stroke Patients Only)       Balance Overall balance assessment: Needs assistance Sitting-balance support: Feet supported, No upper extremity supported Sitting balance-Leahy Scale: Normal     Standing balance support: Bilateral upper extremity supported, During functional activity, No upper extremity supported Standing balance-Leahy Scale: Fair Standing balance comment: Fair at best during amb without an AD but good with RW  Cognition Arousal: Alert Behavior During Therapy: Impulsive Overall Cognitive Status: Within Functional Limits for tasks assessed                                          Exercises Other Exercises Other Exercises: Pt education on importance of calling for assist with OOB activity and of using the RW with gait     General Comments        Pertinent Vitals/Pain Pain Assessment Pain Assessment: No/denies pain    Home Living                          Prior Function            PT Goals (current goals can now be found in the care plan section) Progress towards PT goals: Progressing toward goals    Frequency    Min 1X/week      PT Plan      Co-evaluation              AM-PAC PT 6 Clicks Mobility   Outcome Measure  Help needed turning from your back to your side while in a flat bed without using bedrails?: None Help needed moving from lying on your back to sitting on the side of a flat bed without using bedrails?: None Help needed moving to and from a bed to a chair (including a wheelchair)?: A Little Help needed standing up from a chair using your arms (e.g., wheelchair or bedside chair)?: A Little Help needed to walk in hospital room?: A Little Help needed climbing 3-5 steps with a railing? : A Little 6 Click Score: 20    End of Session Equipment Utilized During Treatment: Gait belt Activity Tolerance: Patient tolerated treatment well Patient left: in chair;with call bell/phone within reach;with chair alarm set Nurse Communication: Mobility status;Other (comment) (pt found ambulating in room without assist with bed alarm not going off until pt returned to sitting at the EOB) PT Visit Diagnosis: Unsteadiness on feet (R26.81);History of falling (Z91.81);Muscle weakness (generalized) (M62.81);Difficulty in walking, not elsewhere classified (R26.2)     Time: 8641-8578 PT Time Calculation (min) (ACUTE ONLY): 23 min  Charges:    $Gait Training: 8-22 mins $Therapeutic Activity: 8-22 mins PT General Charges $$ ACUTE PT VISIT: 1 Visit                     D. Scott Makayli Bracken PT, DPT 10/04/23, 3:36 PM

## 2023-10-04 NOTE — Progress Notes (Signed)
 Pt educated to call for assistance prior to mobilizing due to increased fall risk.  Patient had multiple syncopal episodes at admission and has verbalized need for rehab help in home.  During morning rounds, RN activated bed alarm.  RN entered room in AM to find pt standing at toilet in bathroom after mobilizing without help, and no bed alarm was alerting.  Pt helped back to bed and bed alarm activated.  Upon returning to room later, pt was beginning to stand to go the bathroom again; bed alarm was deactivated prior to RN arrival.  Pt stated he wanted it off, because it went off while he was trying to turn over.  RN explained the rationale behind activating the alarm, including syncopal episodes, poor gait while ambulating, unsteady stance, and the need for staff to hear when a pt who is unsteady and hasn't called needs assistance.  Pt verbalized understanding but continued to state he did not need help to the bathroom when it was just right there.  After PT alerted RN that pt was once again ambulating without first calling for assistance, RN initiated tele-monitor system.

## 2023-10-04 NOTE — Assessment & Plan Note (Signed)
 Patient had 2 short and weird syncopal episodes-rapid response was called.  Episodes occur during transportation from ED to floor and 1 after the bowel movement.  Orthostatic vitals with some decline but never met criteria for positive orthostasis. No chest pain and vitals remained stable.  No arrhythmia noted -Echocardiogram ordered -Keep him on telemetry

## 2023-10-04 NOTE — Progress Notes (Signed)
 Introduced patient to role of statistician. Pt OOB seated in chair, finishing breakfast. Intake questions completed.  Patient originally from [place]  state. He moved to [place] following his mother's death to be closer to his son. He endorses being divorced. Patient lives alone, but has assistance from his son and son's girlfriend. Patient is retired from [employer]. He receives retirement income. Denies any SDOH needs. Pt no longer drives, but son assists with transportation. Pt does have a remote hx of incarceration years ago in [place] . Denies referral to [employer].  Pt is established with Mardy Maxin, NP as his primary care, although he does state he will likely change providers. Pt encouraged to keep existing appts with Ms. Abernathy until new PCP is established to help bridge care. Pt verbalized understanding.  Pt encouraged to call with any future needs.

## 2023-10-05 ENCOUNTER — Inpatient Hospital Stay (HOSPITAL_COMMUNITY)
Admit: 2023-10-05 | Discharge: 2023-10-05 | Disposition: A | Discharge status: Home / Self Care | Payer: Medicare HMO | Attending: Internal Medicine

## 2023-10-05 DIAGNOSIS — E782 Mixed hyperlipidemia: Secondary | ICD-10-CM | POA: Diagnosis not present

## 2023-10-05 DIAGNOSIS — R55 Syncope and collapse: Secondary | ICD-10-CM

## 2023-10-05 DIAGNOSIS — J101 Influenza due to other identified influenza virus with other respiratory manifestations: Secondary | ICD-10-CM | POA: Diagnosis not present

## 2023-10-05 DIAGNOSIS — I10 Essential (primary) hypertension: Secondary | ICD-10-CM | POA: Diagnosis not present

## 2023-10-05 LAB — CBC
HCT: 41 % (ref 39.0–52.0)
Hemoglobin: 13.6 g/dL (ref 13.0–17.0)
MCH: 27.7 pg (ref 26.0–34.0)
MCHC: 33.2 g/dL (ref 30.0–36.0)
MCV: 83.5 fL (ref 80.0–100.0)
Platelets: 157 10*3/uL (ref 150–400)
RBC: 4.91 MIL/uL (ref 4.22–5.81)
RDW: 14 % (ref 11.5–15.5)
WBC: 3.2 10*3/uL — ABNORMAL LOW (ref 4.0–10.5)
nRBC: 0 % (ref 0.0–0.2)

## 2023-10-05 LAB — ECHOCARDIOGRAM COMPLETE
AR max vel: 4.28 cm2
AV Area VTI: 4.59 cm2
AV Area mean vel: 3.83 cm2
AV Mean grad: 2 mm[Hg]
AV Peak grad: 3 mm[Hg]
Ao pk vel: 0.87 m/s
Area-P 1/2: 3.31 cm2
Height: 69 in
P 1/2 time: 538 ms
S' Lateral: 3.3 cm
Weight: 3520 [oz_av]

## 2023-10-05 LAB — BASIC METABOLIC PANEL
Anion gap: 7 (ref 5–15)
BUN: 34 mg/dL — ABNORMAL HIGH (ref 8–23)
CO2: 26 mmol/L (ref 22–32)
Calcium: 9 mg/dL (ref 8.9–10.3)
Chloride: 102 mmol/L (ref 98–111)
Creatinine, Ser: 1.33 mg/dL — ABNORMAL HIGH (ref 0.61–1.24)
GFR, Estimated: 58 mL/min — ABNORMAL LOW (ref >60)
Glucose, Bld: 102 mg/dL — ABNORMAL HIGH (ref 70–99)
Potassium: 3.9 mmol/L (ref 3.5–5.1)
Sodium: 135 mmol/L (ref 135–145)

## 2023-10-05 MED ORDER — ASPIRIN 81 MG PO TBEC: 81.0000 mg | DELAYED_RELEASE_TABLET | Freq: Every day | ORAL | 1 refills | Status: DC

## 2023-10-05 MED ORDER — ROSUVASTATIN CALCIUM 40 MG PO TABS: 40.0000 mg | ORAL_TABLET | Freq: Every day | ORAL | 1 refills | Status: AC

## 2023-10-05 MED ORDER — OSELTAMIVIR PHOSPHATE 75 MG PO CAPS
75.0000 mg | ORAL_CAPSULE | Freq: Two times a day (BID) | ORAL | Status: DC
  Administered 2023-10-05: 75 mg via ORAL
  Filled 2023-10-05: qty 1

## 2023-10-05 MED ORDER — ENALAPRIL MALEATE 10 MG PO TABS
40.0000 mg | ORAL_TABLET | Freq: Every day | ORAL | Status: DC
  Administered 2023-10-05: 40 mg via ORAL
  Filled 2023-10-05: qty 4

## 2023-10-05 MED ORDER — CLONIDINE HCL 0.3 MG PO TABS: 0.3000 mg | ORAL_TABLET | Freq: Every day | ORAL | 1 refills | Status: DC

## 2023-10-05 MED ORDER — ENALAPRIL MALEATE 20 MG PO TABS: 40.0000 mg | ORAL_TABLET | Freq: Every day | ORAL | 1 refills | Status: AC

## 2023-10-05 MED ORDER — ISOSORBIDE MONONITRATE ER 30 MG PO TB24: 30.0000 mg | ORAL_TABLET | Freq: Every day | ORAL | 1 refills | Status: AC

## 2023-10-05 MED ORDER — METOPROLOL SUCCINATE ER 100 MG PO TB24: 100.0000 mg | ORAL_TABLET | Freq: Every day | ORAL | 1 refills | Status: AC

## 2023-10-05 MED ORDER — OSELTAMIVIR PHOSPHATE 75 MG PO CAPS: 75.0000 mg | ORAL_CAPSULE | Freq: Two times a day (BID) | ORAL | 0 refills | Status: AC

## 2023-10-05 MED ORDER — ESCITALOPRAM OXALATE 20 MG PO TABS: 20.0000 mg | ORAL_TABLET | Freq: Every day | ORAL | 1 refills | Status: AC

## 2023-10-05 NOTE — TOC Transition Note (Signed)
 Transition of Care [facility]) - Discharge Note   Patient Details  Name: Howard Weaver MRN: [id number] Date of Birth: 26-Apr-1956  Transition of Care [facility]) CM/SW Contact:  Ladene Lady, LCSW Phone Number: 10/05/2023, 10:49 AM   Clinical Narrative:   Pt has orders to discharge home. He states he is still with dr liana for primary care. He wants outpaitent at [facility]. He states his son will transport or [employer] will pay for it. He does want a RW. RW ordered through jon at [employer].    Final next level of care: OP Rehab Barriers to Discharge: Barriers Resolved   Patient Goals and CMS Choice Patient states their goals for this hospitalization and ongoing recovery are:: go to outpaitent PT CMS Medicare.gov Compare Post Acute Care list provided to:: Patient Represenative (must comment) Choice offered to / list presented to : Patient      Discharge Placement                Patient to be transferred to facility by: son      Discharge Plan and Services Additional resources added to the After Visit Summary for                  DME Arranged: Walker rolling DME Agency: [employer] Date DME Agency Contacted: 10/05/23 Time DME Agency Contacted: 1047 Representative spoke with at DME Agency: Thom            Social Drivers of Health (SDOH) Interventions SDOH Screenings   Food Insecurity: No Food Insecurity (10/03/2023)  Housing: Low Risk  (10/03/2023)  Transportation Needs: No Transportation Needs (10/03/2023)  Utilities: Not At Risk (10/03/2023)  Alcohol Screen: Low Risk  (06/10/2022)  Depression (PHQ2-9): Low Risk  (09/24/2022)  Social Connections: Moderately Integrated (10/03/2023)  Tobacco Use: Medium Risk (10/02/2023)     Readmission Risk Interventions     No data to display

## 2023-10-05 NOTE — Plan of Care (Signed)

## 2023-10-05 NOTE — Care Management Important Message (Signed)
 Important Message  Patient Details  Name: Howard Weaver MRN: [id number] Date of Birth: [birth year]   Important Message Given:  Yes - Medicare IM     Ronald Vinsant W, CMA 10/05/2023, 1:46 PM

## 2023-10-05 NOTE — Discharge Summary (Signed)
 Physician Discharge Summary   Patient: Howard Weaver MRN: [id number] DOB: 04-15-1956  Admit date:     10/02/2023  Discharge date: 10/05/23  Discharge Physician: Amaryllis Dare   PCP: Liana Fish, NP   Recommendations at discharge:  Please obtain CBC and BMP on follow-up Please follow-up on echocardiogram results Follow-up on compliance with medication Follow-up with primary care provider within a week  Discharge Diagnoses: Principal Problem:   Influenza A Active Problems:   Essential hypertension   Syncope   Coronary artery disease due to lipid rich plaque   AKI (acute kidney injury) (HCC)   Mixed hyperlipidemia   Depression   GERD (gastroesophageal reflux disease)   Hospital Course: Taken from H&P.   Aron [name] is a 68 y.o. male with medical history significant of hypertension, hyperlipidemia, GERD, CAD, depression who presents to the emergency department due to worsening flu symptoms.  Patient was seen this morning in the ED with complaints of generalized abdominal pain with associated nausea without vomiting, respiratory panel done was positive for influenza.  Morphine  was given for abdominal pain, Zofran  was given for nausea, patient refused Tamiflu  offered, he was discharged home.  He returned this evening with worsening symptoms and complained of generalized weakness with difficulty in being able to walk and ongoing nausea.   On presentation patient was little tachycardic, labs mostly unremarkable.  Influenza A was positive. CT abdominal pelvis with no acute abnormalities. Started on Tamiflu .  2/3: Vital stable on room air, chest x-ray with atelectasis and no other acute abnormality.  PT evaluation ordered as he does not think that he can go home safely due to weakness.  2/4: Patient had 2 very weird syncopal type episodes, vitals remained stable and he recovered pretty quickly.  Per patient he had similar episodes prior whenever he quickly stands up.   Orthostatic vitals were checked, they did show some drop but never met criteria for positive orthostatic vitals.  No chest pain.  Echocardiogram was ordered and pending Giving some gentle IV fluid PT is recommending home health  2/5; remained hemodynamically stable, creatinine continued to improve and now close to baseline.  Blood pressure mildly elevated so restarting home enalapril .  Apparently patient was not taking his medications consistently. Counseling was provided to stay compliant. Echocardiogram done with pending results but patient is stable and at baseline.  No more dizziness while getting up.  He was instructed to keep himself well-hydrated and do not move very fast.  Patient was provided with new prescriptions for his home medications at his request.  He will continue on current medications and need to have a close follow-up with his providers for further recommendation.  Assessment and Plan: * Influenza A On room air, chest x-ray without any significant abnormality. -Continue with Tamiflu  -Continue with supportive care -PT evaluation-recommending home health PT which was ordered.  Syncope Patient had 2 short and weird syncopal episodes-rapid response was called.  Episodes occur during transportation from ED to floor and 1 after the bowel movement.  Orthostatic vitals with some decline but never met criteria for positive orthostasis. No chest pain and vitals remained stable.  No arrhythmia noted -Echocardiogram done with pending results.  Essential hypertension Blood pressure mildly elevated -Continue home Toprol  and clonidine  -Home enalapril  was restarted and chlorthalidone  was discontinued which can be restarted by primary care provider as appropriate.  AKI (acute kidney injury) (HCC) Improving creatinine. -Continue to monitor -Avoid nephrotoxins  Coronary artery disease due to lipid rich plaque No chest  pain. -Continue home Toprol , Crestor  and aspirin   Mixed  hyperlipidemia -Continue home Crestor   Depression -Continue Lexapro   GERD (gastroesophageal reflux disease) -Continue Protonix   Consultants: None Procedures performed: None Disposition: Home Diet recommendation:  Discharge Diet Orders (From admission, onward)     Start     Ordered   10/05/23 0000  Diet - low sodium heart healthy        10/05/23 1036           Regular diet DISCHARGE MEDICATION: Allergies as of 10/05/2023       Reactions   Penicillins Anaphylaxis   Has patient had a PCN reaction causing immediate rash, facial/tongue/throat swelling, SOB or lightheadedness with hypotension: Yes Has patient had a PCN reaction causing severe rash involving mucus membranes or skin necrosis: Yes Has patient had a PCN reaction that required hospitalization Yes Has patient had a PCN reaction occurring within the last 10 years: No If all of the above answers are NO, then may proceed with Cephalosporin use.   Ibuprofen Other (See Comments)   Gi upset        Medication List     STOP taking these medications    busPIRone  15 MG tablet Commonly known as: BUSPAR    chlorthalidone  25 MG tablet Commonly known as: HYGROTON    clopidogrel  75 MG tablet Commonly known as: PLAVIX    HYDROcodone -acetaminophen  5-325 MG tablet Commonly known as: NORCO/VICODIN       TAKE these medications    aspirin  EC 81 MG tablet Take 1 tablet (81 mg total) by mouth daily.   clonazePAM  0.5 MG tablet Commonly known as: KLONOPIN  Take(0.25 mg ) 1/2  tablet daily.   cloNIDine  0.3 MG tablet Commonly known as: CATAPRES  Take 1 tablet (0.3 mg total) by mouth at bedtime.   enalapril  20 MG tablet Commonly known as: VASOTEC  Take 2 tablets (40 mg total) by mouth daily.   escitalopram  20 MG tablet Commonly known as: LEXAPRO  Take 1 tablet (20 mg total) by mouth daily.   isosorbide  mononitrate 30 MG 24 hr tablet Commonly known as: IMDUR  Take 1 tablet (30 mg total) by mouth daily.    metoprolol  succinate 100 MG 24 hr tablet Commonly known as: TOPROL -XL Take 1 tablet (100 mg total) by mouth daily. Take with or immediately following a meal.   ondansetron  4 MG tablet Commonly known as: Zofran  Take 1 tablet (4 mg total) by mouth every 6 (six) hours as needed for up to 7 days for nausea or vomiting.   oseltamivir  75 MG capsule Commonly known as: TAMIFLU  Take 1 capsule (75 mg total) by mouth 2 (two) times daily for 6 doses.   pantoprazole  40 MG tablet Commonly known as: PROTONIX  Take 1 tablet (40 mg total) by mouth daily.   rosuvastatin  40 MG tablet Commonly known as: CRESTOR  Take 1 tablet (40 mg total) by mouth daily.        Follow-up Information     Liana Fish, NP Follow up.   Specialty: Nurse Practitioner Why: Hospital follow up Contact information: [address] [place] [address] [phone]                Discharge Exam: Fredricka Weights   10/02/23 1658  Weight: 99.8 kg   General.  Obese gentleman, in no acute distress. Pulmonary.  Lungs clear bilaterally, normal respiratory effort. CV.  Regular rate and rhythm, no JVD, rub or murmur. Abdomen.  Soft, nontender, nondistended, BS positive. CNS.  Alert and oriented .  No focal neurologic deficit.  Extremities.  No edema, no cyanosis, pulses intact and symmetrical. Psychiatry.  Judgment and insight appears normal.   Condition at discharge: stable  The results of significant diagnostics from this hospitalization (including imaging, microbiology, ancillary and laboratory) are listed below for reference.   Imaging Studies: DG Chest 2 View Result Date: 10/03/2023 CLINICAL DATA:  Flu EXAM: CHEST - 2 VIEW COMPARISON:  07/22/2023 FINDINGS: Heart is normal size. Tortuous aorta. Peribronchial thickening and increased markings in the lung bases, likely atelectasis. No effusions. No acute bony abnormality. IMPRESSION: Bronchitic changes. Increased markings in the lung bases, favor  atelectasis. Electronically Signed   By: Franky Crease M.D.   On: 10/03/2023 10:47   CT ABDOMEN PELVIS W CONTRAST Result Date: 10/02/2023 CLINICAL DATA:  Acute, nonlocalized abdominal pain EXAM: CT ABDOMEN AND PELVIS WITH CONTRAST TECHNIQUE: Multidetector CT imaging of the abdomen and pelvis was performed using the standard protocol following bolus administration of intravenous contrast. RADIATION DOSE REDUCTION: This exam was performed according to the departmental dose-optimization program which includes automated exposure control, adjustment of the mA and/or kV according to patient size and/or use of iterative reconstruction technique. CONTRAST:  OMNIPAQUE  IOHEXOL  300 MG/ML  SOLN COMPARISON:  04/15/2023 FINDINGS: Lower chest: No acute finding. Coronary atherosclerosis. 6 mm nodule in the right lower lung near the minor fissure, stable from 05/01/2014 chest CT and benign. Hepatobiliary: No focal liver abnormality.No evidence of biliary obstruction or stone. Pancreas: Unremarkable. Spleen: Unremarkable. Adrenals/Urinary Tract: Negative adrenals. No hydronephrosis or stone. Small simple cysts in the bilateral upper pole kidney. Unremarkable bladder. Stomach/Bowel: No obstruction. Appendectomy. No visible bowel wall thickening or other sign of inflammation. Vascular/Lymphatic: No acute vascular abnormality. Atheromatous calcification of the aorta, iliacs, and visceral branches. No mass or adenopathy. Reproductive:No pathologic findings. Other: No ascites or pneumoperitoneum. Musculoskeletal: No acute abnormalities. IMPRESSION: 1. No acute finding . 2. Atherosclerosis including the coronary arteries. Electronically Signed   By: Dorn Roulette M.D.   On: 10/02/2023 04:49    Microbiology: Results for orders placed or performed during the hospital encounter of 10/02/23  Resp panel by RT-PCR (RSV, Flu A&B, Covid) Anterior Nasal Swab     Status: Abnormal   Collection Time: 10/02/23  3:12 AM   Specimen:  Anterior Nasal Swab  Result Value Ref Range Status   SARS Coronavirus 2 by RT PCR NEGATIVE NEGATIVE Final    Comment: (NOTE) SARS-CoV-2 target nucleic acids are NOT DETECTED.  The SARS-CoV-2 RNA is generally detectable in upper respiratory specimens during the acute phase of infection. The lowest concentration of SARS-CoV-2 viral copies this assay can detect is 138 copies/mL. A negative result does not preclude SARS-Cov-2 infection and should not be used as the sole basis for treatment or other patient management decisions. A negative result may occur with  improper specimen collection/handling, submission of specimen other than nasopharyngeal swab, presence of viral mutation(s) within the areas targeted by this assay, and inadequate number of viral copies(<138 copies/mL). A negative result must be combined with clinical observations, patient history, and epidemiological information. The expected result is Negative.  Fact Sheet for Patients:  bloggercourse.com  Fact Sheet for Healthcare Providers:  seriousbroker.it  This test is no t yet approved or cleared by the [place]  FDA and  has been authorized for detection and/or diagnosis of SARS-CoV-2 by FDA under an Emergency Use Authorization (EUA). This EUA will remain  in effect (meaning this test can be used) for the duration of the COVID-19 declaration under Section 564(b)(1) of the Act,  21 U.S.C.section 360bbb-3(b)(1), unless the authorization is terminated  or revoked sooner.       Influenza A by PCR POSITIVE (A) NEGATIVE Final   Influenza B by PCR NEGATIVE NEGATIVE Final    Comment: (NOTE) The Xpert Xpress SARS-CoV-2/FLU/RSV plus assay is intended as an aid in the diagnosis of influenza from Nasopharyngeal swab specimens and should not be used as a sole basis for treatment. Nasal washings and aspirates are unacceptable for Xpert Xpress  SARS-CoV-2/FLU/RSV testing.  Fact Sheet for Patients: bloggercourse.com  Fact Sheet for Healthcare Providers: seriousbroker.it  This test is not yet approved or cleared by the [place]  FDA and has been authorized for detection and/or diagnosis of SARS-CoV-2 by FDA under an Emergency Use Authorization (EUA). This EUA will remain in effect (meaning this test can be used) for the duration of the COVID-19 declaration under Section 564(b)(1) of the Act, 21 U.S.C. section 360bbb-3(b)(1), unless the authorization is terminated or revoked.     Resp Syncytial Virus by PCR NEGATIVE NEGATIVE Final    Comment: (NOTE) Fact Sheet for Patients: bloggercourse.com  Fact Sheet for Healthcare Providers: seriousbroker.it  This test is not yet approved or cleared by the [place]  FDA and has been authorized for detection and/or diagnosis of SARS-CoV-2 by FDA under an Emergency Use Authorization (EUA). This EUA will remain in effect (meaning this test can be used) for the duration of the COVID-19 declaration under Section 564(b)(1) of the Act, 21 U.S.C. section 360bbb-3(b)(1), unless the authorization is terminated or revoked.  Performed at [facility], [address] [place], [place] [address]     Labs: CBC: Recent Labs  Lab 10/02/23 [phone] 10/02/23 1836 10/03/23 0357 10/05/23 0625  WBC 8.1 6.5 4.4 3.2*  NEUTROABS 7.0  --   --   --   HGB 15.7 16.1 14.5 13.6  HCT 47.6 48.7 45.2 41.0  MCV 85.0 83.8 86.3 83.5  PLT 201 188 159 157   Basic Metabolic Panel: Recent Labs  Lab 10/02/23 0312 10/03/23 0357 10/04/23 0501 10/05/23 0625  NA 134* 138  138  --  135  K 4.3 3.8  3.8  --  3.9  CL 96* 100  101  --  102  CO2 25 26  26   --  26  GLUCOSE 150* 96  95  --  102*  BUN 15 25*  25*  --  34*  CREATININE 1.22 1.79*  1.76* 1.64* 1.33*  CALCIUM  9.9 9.0  9.1  --   9.0  MG  --  2.0  --   --    Liver Function Tests: Recent Labs  Lab 10/02/23 0312 10/03/23 0357  AST 20 21  20   ALT 18 16  15   ALKPHOS 79 52  56  BILITOT 0.7 0.6  0.7  PROT 8.6* 7.1  7.1  ALBUMIN 4.2 3.4*  3.4*   CBG: Recent Labs  Lab 10/03/23 1759 10/03/23 1842  GLUCAP 128* 142*    Discharge time spent: greater than 30 minutes.  This record has been created using Conservation officer, historic buildings. Errors have been sought and corrected,but may not always be located. Such creation errors do not reflect on the standard of care.   Signed: Amaryllis Dare, MD [facility] 10/05/2023

## 2023-10-05 NOTE — TOC CM/SW Note (Signed)
     [facility]ILITATION SERVICES REFERRAL        Occupational Therapy * Physical Therapy * Speech Therapy                           DATE 10/05/2023  PATIENT NAME  Howard Weaver PATIENT MRN [id number]        DIAGNOSIS/DIAGNOSIS CODE   Syncope.  DATE OF DISCHARGE:  10/05/2023       PRIMARY CARE PHYSICIAN mardy maxin     PCP PHONE/FAX      Dear Provider (Name: [facility] __  Fax: [phone]   I certify that I have examined this patient and that occupational/physical/speech therapy is necessary on an outpatient basis.    The patient has expressed interest in completing their recommended course of therapy at your  location.  Once a formal order from the patient's primary care physician has been obtained, please  contact him/her to schedule an appointment for evaluation at your earliest convenience.   [ x]  Physical Therapy Evaluate and Treat  [ x ]  Occupational Therapy Evaluate and Treat  [  ]  Speech Therapy Evaluate and Treat         The patient's primary care physician (listed above) must furnish and be responsible for a formal order such that the recommended services may be furnished while under the primary physician's care, and that the plan of care will be established and reviewed every 30 days (or more often if condition necessitates).

## 2023-10-06 ENCOUNTER — Telehealth: Payer: Self-pay | Admitting: Nurse Practitioner

## 2023-10-06 NOTE — Telephone Encounter (Signed)
 Received PT/OT referral. At front desk for patient's 10/18/23 appointment-Toni

## 2023-10-18 ENCOUNTER — Telehealth: Payer: Medicare HMO | Admitting: Nurse Practitioner

## 2023-10-18 ENCOUNTER — Encounter: Payer: Self-pay | Admitting: Nurse Practitioner

## 2023-10-18 VITALS — Resp 16 | Ht 69.0 in | Wt 220.0 lb

## 2023-10-18 DIAGNOSIS — J101 Influenza due to other identified influenza virus with other respiratory manifestations: Secondary | ICD-10-CM | POA: Diagnosis not present

## 2023-10-18 DIAGNOSIS — Z79899 Other long term (current) drug therapy: Secondary | ICD-10-CM | POA: Diagnosis not present

## 2023-10-18 DIAGNOSIS — I10 Essential (primary) hypertension: Secondary | ICD-10-CM

## 2023-10-18 DIAGNOSIS — Z09 Encounter for follow-up examination after completed treatment for conditions other than malignant neoplasm: Secondary | ICD-10-CM

## 2023-10-18 DIAGNOSIS — F411 Generalized anxiety disorder: Secondary | ICD-10-CM

## 2023-10-18 MED ORDER — CLONAZEPAM 0.5 MG PO TABS: ORAL_TABLET | ORAL | 0 refills | Status: AC

## 2023-10-18 MED ORDER — PANTOPRAZOLE SODIUM 40 MG PO TBEC: 40.0000 mg | DELAYED_RELEASE_TABLET | Freq: Every day | ORAL | 1 refills | Status: AC

## 2023-10-18 NOTE — Progress Notes (Signed)
 [facility] [place], [place] [address] [place] [address] [phone]                                   Transitional Care Clinic   [facility] Discharge Acute Issues Care Follow Up                                                                        Patient Demographics  Howard Weaver, is a 68 y.o. male  DOB 1956-08-21  MRN [id number].  Primary MD  Sallyanne Kuster, NP  Admit date:     10/02/2023  Discharge date: 10/05/23   I connected with the patient at 1655 by telephone and verified the patients identity using two identifiers.   I discussed the limitations, risks, security and privacy concerns of performing an evaluation and management service by telephone and the availability of in person appointments. I also discussed with the patient that there may be a patient responsible charge related to the service.  The patient expressed understanding and agrees to proceed.    Reason for TCC follow Up - influenza A, AKI, syncope   Past Medical History:  Diagnosis Date   Appendicitis 09/24/2021   Bleeding stomach ulcer 2018   Brain aneurysm 2014   CAD (coronary artery disease)    Chest pain    CKD (chronic kidney disease) stage 3, GFR 30-59 ml/min (HCC)    Depression    DU (duodenal ulcer)    DVT of leg (deep venous thrombosis) (HCC) 01/16/2020   GAD (generalized anxiety disorder)    Hyperlipidemia    Hypertension    Myocardial infarction (HCC)    2017 & 2019   Radiculopathy of thoracolumbar region    Stroke [facility])    2014 & 2021   SVT (supraventricular tachycardia) (HCC)    Tachycardia     Past Surgical History:  Procedure Laterality Date   CARDIAC CATHETERIZATION Right 11/25/2015   Procedure: Left Heart Cath and Coronary Angiography;  Surgeon: Laurier Nancy, MD;  Location: [facility] INVASIVE CV LAB;  Service: Cardiovascular;  Laterality: Right;   CEREBRAL ANEURYSM REPAIR  2014   ESOPHAGOGASTRODUODENOSCOPY  04/12/2014   STOMACH SURGERY      bleeding ulcer repair   TEE WITHOUT CARDIOVERSION Right 01/17/2020   Procedure: TRANSESOPHAGEAL ECHOCARDIOGRAM (TEE);  Surgeon: Laurier Nancy, MD;  Location: [facility] ORS;  Service: Cardiovascular;  Laterality: Right;   TEE WITHOUT CARDIOVERSION Right 11/22/2023   Procedure: ECHOCARDIOGRAM, TRANSESOPHAGEAL;  Surgeon: Laurier Nancy, MD;  Location: [facility] ORS;  Service: Cardiovascular;  Laterality: Right;   XI ROBOTIC LAPAROSCOPIC ASSISTED APPENDECTOMY N/A 11/11/2021   Procedure: XI ROBOTIC LAPAROSCOPIC ASSISTED APPENDECTOMY;  Surgeon: [name], MD;  Location: [facility] ORS;  Service: General;  Laterality: N/A;       Recent HPI and Hospital Course  Hospital Course: Taken from H&P.    Howard Weaver is a 68 y.o. male with medical history significant of hypertension, hyperlipidemia, GERD, CAD, depression who presents to the emergency department due to worsening flu symptoms.  Patient was seen this morning in the ED with complaints of generalized abdominal pain with associated nausea without vomiting,  respiratory panel done was positive for influenza.  Morphine was given for abdominal pain, Zofran was given for nausea, patient refused Tamiflu offered, he was discharged home.  He returned this evening with worsening symptoms and complained of generalized weakness with difficulty in being able to walk and ongoing nausea.    On presentation patient was little tachycardic, labs mostly unremarkable.  Influenza A was positive. CT abdominal pelvis with no acute abnormalities. Started on Tamiflu.   2/3: Vital stable on room air, chest x-ray with atelectasis and no other acute abnormality.  PT evaluation ordered as he does not think that he can go home safely due to weakness.   2/4: Patient had 2 very weird syncopal type episodes, vitals remained stable and he recovered pretty quickly.  Per patient he had similar episodes prior whenever he quickly stands up.  Orthostatic vitals were checked, they did  show some drop but never met criteria for positive orthostatic vitals.  No chest pain.  Echocardiogram was ordered and pending Giving some gentle IV fluid PT is recommending home health   2/5; remained hemodynamically stable, creatinine continued to improve and now close to baseline.  Blood pressure mildly elevated so restarting home enalapril.  Apparently patient was not taking his medications consistently. Counseling was provided to stay compliant. Echocardiogram done with pending results but patient is stable and at baseline.  No more dizziness while getting up.  He was instructed to keep himself well-hydrated and do not move very fast.   Patient was provided with new prescriptions for his home medications at his request.   He will continue on current medications and need to have a close follow-up with his providers for further recommendation.   Assessment and Plan: * Influenza A On room air, chest x-ray without any significant abnormality. -Continue with Tamiflu -Continue with supportive care -PT evaluation-recommending home health PT which was ordered.   Syncope Patient had 2 short and weird syncopal episodes-rapid response was called.  Episodes occur during transportation from ED to floor and 1 after the bowel movement.  Orthostatic vitals with some decline but never met criteria for positive orthostasis. No chest pain and vitals remained stable.  No arrhythmia noted -Echocardiogram done with pending results.   Essential hypertension Blood pressure mildly elevated -Continue home Toprol and clonidine -Home enalapril was restarted and chlorthalidone was discontinued which can be restarted by primary care provider as appropriate.   AKI (acute kidney injury) (HCC) Improving creatinine. -Continue to monitor -Avoid nephrotoxins   Coronary artery disease due to lipid rich plaque No chest pain. -Continue home Toprol, Crestor and aspirin   Mixed hyperlipidemia -Continue home  Crestor   Depression -Continue Lexapro   GERD (gastroesophageal reflux disease) -Continue Protonix  Post Hospital Acute Care Issue to be followed in the Clinic   Principal Problem:   Influenza A Active Problems:   Essential hypertension   Syncope   Coronary artery disease due to lipid rich plaque   AKI (acute kidney injury) (HCC)   Mixed hyperlipidemia   Depression   GERD (gastroesophageal reflux disease)   Subjective:   Howard Weaver today has, No headache, No chest pain, No abdominal pain - No Nausea, No new weakness tingling or numbness, No Cough - SOB.   Assessment & Plan   1. Hospital discharge follow-up (Primary) Treated for flu A during hospital stay. Repeat labs ordered  - CBC with Differential/Platelet  2. Influenza A Repeat labs ordered. Treated for flu A during hospital stay  - CBC with  Differential/Platelet  3. Essential hypertension Continue medications as prescribed, labs ordered for repeat - CBC with Differential/Platelet  4. Encounter for medication review Medication list reviewed, updated and refills ordered.  - pantoprazole (PROTONIX) 40 MG tablet; Take 1 tablet (40 mg total) by mouth daily.  Dispense: 90 tablet; Refill: 1  5. GAD (generalized anxiety disorder) Continue clonazepam as prescribed, follow up in 3 months for additional refills . - clonazePAM (KLONOPIN) 0.5 MG tablet; Take(0.25 mg ) 1/2  tablet daily.  Dispense: 90 tablet; Refill: 0    Reason for frequent admissions/ER visits       Objective:   Vitals:   10/18/23 1503  Resp: 16  Weight: 220 lb (99.8 kg)  Height: 5\' 9"  (1.753 m)    Wt Readings from Last 3 Encounters:  11/20/23 200 lb (90.7 kg)  10/18/23 220 lb (99.8 kg)  10/02/23 220 lb (99.8 kg)    Allergies as of 10/18/2023       Reactions   Penicillins Anaphylaxis   Has patient had a PCN reaction causing immediate rash, facial/tongue/throat swelling, SOB or lightheadedness with hypotension: Yes Has patient  had a PCN reaction causing severe rash involving mucus membranes or skin necrosis: Yes Has patient had a PCN reaction that required hospitalization Yes Has patient had a PCN reaction occurring within the last 10 years: No If all of the above answers are "NO", then may proceed with Cephalosporin use.   Ibuprofen Other (See Comments)   Gi upset        Medication List        Accurate as of October 18, 2023 11:59 PM. If you have any questions, ask your nurse or doctor.          aspirin EC 81 MG tablet Take 1 tablet (81 mg total) by mouth daily.   clonazePAM 0.5 MG tablet Commonly known as: KLONOPIN Take(0.25 mg ) 1/2  tablet daily.   cloNIDine 0.3 MG tablet Commonly known as: CATAPRES Take 1 tablet (0.3 mg total) by mouth at bedtime.   enalapril 20 MG tablet Commonly known as: VASOTEC Take 2 tablets (40 mg total) by mouth daily.   escitalopram 20 MG tablet Commonly known as: LEXAPRO Take 1 tablet (20 mg total) by mouth daily.   isosorbide mononitrate 30 MG 24 hr tablet Commonly known as: IMDUR Take 1 tablet (30 mg total) by mouth daily.   metoprolol succinate 100 MG 24 hr tablet Commonly known as: TOPROL-XL Take 1 tablet (100 mg total) by mouth daily. Take with or immediately following a meal.   pantoprazole 40 MG tablet Commonly known as: PROTONIX Take 1 tablet (40 mg total) by mouth daily.   rosuvastatin 40 MG tablet Commonly known as: CRESTOR Take 1 tablet (40 mg total) by mouth daily.         Objective data: He is alert and oriented. No acute distress noted.    Data Review   Micro Results No results found for this or any previous visit (from the past 240 hours).   CBC No results for input(s): "WBC", "HGB", "HCT", "PLT", "MCV", "MCH", "MCHC", "RDW", "LYMPHSABS", "MONOABS", "EOSABS", "BASOSABS", "BANDABS" in the last 168 hours.  Invalid input(s): "NEUTRABS", "BANDSABD"  Chemistries  No results for input(s): "NA", "K", "CL", "CO2", "GLUCOSE",  "BUN", "CREATININE", "CALCIUM", "MG", "AST", "ALT", "ALKPHOS", "BILITOT" in the last 168 hours.  Invalid input(s): "GFRCGP" ------------------------------------------------------------------------------------------------------------------ estimated creatinine clearance is 60.1 mL/min (A) (by C-G formula based on SCr of 1.37 mg/dL (H)). ------------------------------------------------------------------------------------------------------------------ No results for  input(s): "HGBA1C" in the last 72 hours. ------------------------------------------------------------------------------------------------------------------ No results for input(s): "CHOL", "HDL", "LDLCALC", "TRIG", "CHOLHDL", "LDLDIRECT" in the last 72 hours. ------------------------------------------------------------------------------------------------------------------ No results for input(s): "TSH", "T4TOTAL", "T3FREE", "THYROIDAB" in the last 72 hours.  Invalid input(s): "FREET3" ------------------------------------------------------------------------------------------------------------------ No results for input(s): "VITAMINB12", "FOLATE", "FERRITIN", "TIBC", "IRON", "RETICCTPCT" in the last 72 hours.  Coagulation profile No results for input(s): "INR", "PROTIME" in the last 168 hours.  No results for input(s): "DDIMER" in the last 72 hours.  Cardiac Enzymes No results for input(s): "CKMB", "TROPONINI", "MYOGLOBIN" in the last 168 hours.  Invalid input(s): "CK" ------------------------------------------------------------------------------------------------------------------ Invalid input(s): "POCBNP"  Return in about 1 month (around 11/15/2023) for F/U, Dianara Smullen PCP.   Time Spent in minutes  45 Time spent with patient included reviewing progress notes, labs, imaging studies, and discussing plan for follow up.   This patient was seen by Sallyanne Kuster, FNP-C in collaboration with Dr. Beverely Risen as a part of collaborative  care agreement.    Sallyanne Kuster MSN, FNP-C on 10/18/2023 at 4:52 PM   **Disclaimer: This note may have been dictated with voice recognition software. Similar sounding words can inadvertently be transcribed and this note may contain transcription errors which may not have been corrected upon publication of note.**

## 2023-11-20 ENCOUNTER — Observation Stay

## 2023-11-20 ENCOUNTER — Emergency Department

## 2023-11-20 ENCOUNTER — Other Ambulatory Visit: Payer: Self-pay

## 2023-11-20 ENCOUNTER — Inpatient Hospital Stay
Admission: EM | Admit: 2023-11-20 | Discharge: 2023-11-23 | DRG: 065 | Disposition: A | Discharge status: Home Health | Attending: Internal Medicine | Admitting: Internal Medicine

## 2023-11-20 DIAGNOSIS — F419 Anxiety disorder, unspecified: Secondary | ICD-10-CM | POA: Diagnosis present

## 2023-11-20 DIAGNOSIS — I700 Atherosclerosis of aorta: Secondary | ICD-10-CM | POA: Diagnosis not present

## 2023-11-20 DIAGNOSIS — I6389 Other cerebral infarction: Principal | ICD-10-CM | POA: Diagnosis present

## 2023-11-20 DIAGNOSIS — F411 Generalized anxiety disorder: Secondary | ICD-10-CM | POA: Diagnosis present

## 2023-11-20 DIAGNOSIS — Z8249 Family history of ischemic heart disease and other diseases of the circulatory system: Secondary | ICD-10-CM

## 2023-11-20 DIAGNOSIS — I160 Hypertensive urgency: Secondary | ICD-10-CM | POA: Diagnosis present

## 2023-11-20 DIAGNOSIS — J449 Chronic obstructive pulmonary disease, unspecified: Secondary | ICD-10-CM | POA: Diagnosis present

## 2023-11-20 DIAGNOSIS — I63521 Cerebral infarction due to unspecified occlusion or stenosis of right anterior cerebral artery: Secondary | ICD-10-CM | POA: Diagnosis not present

## 2023-11-20 DIAGNOSIS — R29706 NIHSS score 6: Secondary | ICD-10-CM | POA: Diagnosis present

## 2023-11-20 DIAGNOSIS — Z888 Allergy status to other drugs, medicaments and biological substances status: Secondary | ICD-10-CM

## 2023-11-20 DIAGNOSIS — I639 Cerebral infarction, unspecified: Secondary | ICD-10-CM | POA: Diagnosis present

## 2023-11-20 DIAGNOSIS — N179 Acute kidney failure, unspecified: Principal | ICD-10-CM

## 2023-11-20 DIAGNOSIS — M6281 Muscle weakness (generalized): Secondary | ICD-10-CM | POA: Diagnosis not present

## 2023-11-20 DIAGNOSIS — N183 Chronic kidney disease, stage 3 unspecified: Secondary | ICD-10-CM | POA: Diagnosis not present

## 2023-11-20 DIAGNOSIS — F32A Depression, unspecified: Secondary | ICD-10-CM | POA: Diagnosis present

## 2023-11-20 DIAGNOSIS — Z8711 Personal history of peptic ulcer disease: Secondary | ICD-10-CM

## 2023-11-20 DIAGNOSIS — Z880 Allergy status to penicillin: Secondary | ICD-10-CM | POA: Diagnosis not present

## 2023-11-20 DIAGNOSIS — E785 Hyperlipidemia, unspecified: Secondary | ICD-10-CM | POA: Diagnosis present

## 2023-11-20 DIAGNOSIS — M549 Dorsalgia, unspecified: Secondary | ICD-10-CM | POA: Diagnosis present

## 2023-11-20 DIAGNOSIS — R531 Weakness: Secondary | ICD-10-CM

## 2023-11-20 DIAGNOSIS — I6782 Cerebral ischemia: Secondary | ICD-10-CM | POA: Diagnosis not present

## 2023-11-20 DIAGNOSIS — Z86718 Personal history of other venous thrombosis and embolism: Secondary | ICD-10-CM

## 2023-11-20 DIAGNOSIS — I959 Hypotension, unspecified: Secondary | ICD-10-CM

## 2023-11-20 DIAGNOSIS — G9389 Other specified disorders of brain: Secondary | ICD-10-CM | POA: Diagnosis not present

## 2023-11-20 DIAGNOSIS — W19XXXA Unspecified fall, initial encounter: Secondary | ICD-10-CM | POA: Diagnosis not present

## 2023-11-20 DIAGNOSIS — R0902 Hypoxemia: Secondary | ICD-10-CM | POA: Diagnosis not present

## 2023-11-20 DIAGNOSIS — I252 Old myocardial infarction: Secondary | ICD-10-CM | POA: Diagnosis not present

## 2023-11-20 DIAGNOSIS — I129 Hypertensive chronic kidney disease with stage 1 through stage 4 chronic kidney disease, or unspecified chronic kidney disease: Secondary | ICD-10-CM | POA: Diagnosis present

## 2023-11-20 DIAGNOSIS — I2510 Atherosclerotic heart disease of native coronary artery without angina pectoris: Secondary | ICD-10-CM | POA: Diagnosis present

## 2023-11-20 DIAGNOSIS — N1831 Chronic kidney disease, stage 3a: Secondary | ICD-10-CM | POA: Diagnosis present

## 2023-11-20 DIAGNOSIS — E782 Mixed hyperlipidemia: Secondary | ICD-10-CM | POA: Diagnosis not present

## 2023-11-20 DIAGNOSIS — R001 Bradycardia, unspecified: Secondary | ICD-10-CM | POA: Diagnosis not present

## 2023-11-20 DIAGNOSIS — I63512 Cerebral infarction due to unspecified occlusion or stenosis of left middle cerebral artery: Secondary | ICD-10-CM | POA: Diagnosis not present

## 2023-11-20 DIAGNOSIS — G8194 Hemiplegia, unspecified affecting left nondominant side: Secondary | ICD-10-CM | POA: Diagnosis present

## 2023-11-20 DIAGNOSIS — Z833 Family history of diabetes mellitus: Secondary | ICD-10-CM

## 2023-11-20 DIAGNOSIS — Z79899 Other long term (current) drug therapy: Secondary | ICD-10-CM | POA: Diagnosis not present

## 2023-11-20 DIAGNOSIS — I082 Rheumatic disorders of both aortic and tricuspid valves: Secondary | ICD-10-CM | POA: Diagnosis present

## 2023-11-20 DIAGNOSIS — Z1152 Encounter for screening for COVID-19: Secondary | ICD-10-CM | POA: Diagnosis not present

## 2023-11-20 DIAGNOSIS — R202 Paresthesia of skin: Secondary | ICD-10-CM | POA: Diagnosis not present

## 2023-11-20 DIAGNOSIS — Z818 Family history of other mental and behavioral disorders: Secondary | ICD-10-CM

## 2023-11-20 DIAGNOSIS — R9082 White matter disease, unspecified: Secondary | ICD-10-CM | POA: Diagnosis not present

## 2023-11-20 DIAGNOSIS — R29898 Other symptoms and signs involving the musculoskeletal system: Secondary | ICD-10-CM | POA: Diagnosis not present

## 2023-11-20 DIAGNOSIS — Z9181 History of falling: Secondary | ICD-10-CM

## 2023-11-20 DIAGNOSIS — I9589 Other hypotension: Secondary | ICD-10-CM | POA: Diagnosis not present

## 2023-11-20 DIAGNOSIS — Z87891 Personal history of nicotine dependence: Secondary | ICD-10-CM | POA: Diagnosis not present

## 2023-11-20 DIAGNOSIS — G8929 Other chronic pain: Secondary | ICD-10-CM | POA: Diagnosis present

## 2023-11-20 DIAGNOSIS — K219 Gastro-esophageal reflux disease without esophagitis: Secondary | ICD-10-CM | POA: Diagnosis present

## 2023-11-20 DIAGNOSIS — M47816 Spondylosis without myelopathy or radiculopathy, lumbar region: Secondary | ICD-10-CM | POA: Diagnosis not present

## 2023-11-20 DIAGNOSIS — I6523 Occlusion and stenosis of bilateral carotid arteries: Secondary | ICD-10-CM | POA: Diagnosis not present

## 2023-11-20 DIAGNOSIS — Z7982 Long term (current) use of aspirin: Secondary | ICD-10-CM

## 2023-11-20 DIAGNOSIS — R471 Dysarthria and anarthria: Secondary | ICD-10-CM | POA: Diagnosis present

## 2023-11-20 DIAGNOSIS — I10 Essential (primary) hypertension: Secondary | ICD-10-CM | POA: Diagnosis not present

## 2023-11-20 DIAGNOSIS — I672 Cerebral atherosclerosis: Secondary | ICD-10-CM | POA: Diagnosis not present

## 2023-11-20 DIAGNOSIS — R9089 Other abnormal findings on diagnostic imaging of central nervous system: Secondary | ICD-10-CM | POA: Diagnosis not present

## 2023-11-20 LAB — CBC
HCT: 47.1 % (ref 39.0–52.0)
Hemoglobin: 15.4 g/dL (ref 13.0–17.0)
MCH: 28.2 pg (ref 26.0–34.0)
MCHC: 32.7 g/dL (ref 30.0–36.0)
MCV: 86.3 fL (ref 80.0–100.0)
Platelets: 226 10*3/uL (ref 150–400)
RBC: 5.46 MIL/uL (ref 4.22–5.81)
RDW: 16 % — ABNORMAL HIGH (ref 11.5–15.5)
WBC: 7.1 10*3/uL (ref 4.0–10.5)
nRBC: 0 % (ref 0.0–0.2)

## 2023-11-20 LAB — URINALYSIS, COMPLETE (UACMP) WITH MICROSCOPIC
Bacteria, UA: NONE SEEN
Bilirubin Urine: NEGATIVE
Glucose, UA: NEGATIVE mg/dL
Hgb urine dipstick: NEGATIVE
Ketones, ur: NEGATIVE mg/dL
Leukocytes,Ua: NEGATIVE
Nitrite: NEGATIVE
Protein, ur: 30 mg/dL — AB
Specific Gravity, Urine: >1.046 — ABNORMAL HIGH (ref 1.005–1.030)
pH: 5 (ref 5.0–8.0)

## 2023-11-20 LAB — COMPREHENSIVE METABOLIC PANEL
ALT: 11 U/L (ref 0–44)
AST: 17 U/L (ref 15–41)
Albumin: 3.8 g/dL (ref 3.5–5.0)
Alkaline Phosphatase: 62 U/L (ref 38–126)
Anion gap: 10 (ref 5–15)
BUN: 34 mg/dL — ABNORMAL HIGH (ref 8–23)
CO2: 22 mmol/L (ref 22–32)
Calcium: 9.6 mg/dL (ref 8.9–10.3)
Chloride: 103 mmol/L (ref 98–111)
Creatinine, Ser: 3.33 mg/dL — ABNORMAL HIGH (ref 0.61–1.24)
GFR, Estimated: 19 mL/min — ABNORMAL LOW (ref >60)
Glucose, Bld: 126 mg/dL — ABNORMAL HIGH (ref 70–99)
Potassium: 4.3 mmol/L (ref 3.5–5.1)
Sodium: 135 mmol/L (ref 135–145)
Total Bilirubin: 0.9 mg/dL (ref 0.0–1.2)
Total Protein: 7.8 g/dL (ref 6.5–8.1)

## 2023-11-20 LAB — DIFFERENTIAL
Abs Immature Granulocytes: 0.02 10*3/uL (ref 0.00–0.07)
Basophils Absolute: 0 10*3/uL (ref 0.0–0.1)
Basophils Relative: 1 %
Eosinophils Absolute: 0.4 10*3/uL (ref 0.0–0.5)
Eosinophils Relative: 5 %
Immature Granulocytes: 0 %
Lymphocytes Relative: 19 %
Lymphs Abs: 1.3 10*3/uL (ref 0.7–4.0)
Monocytes Absolute: 0.7 10*3/uL (ref 0.1–1.0)
Monocytes Relative: 10 %
Neutro Abs: 4.6 10*3/uL (ref 1.7–7.7)
Neutrophils Relative %: 65 %

## 2023-11-20 LAB — CK: Total CK: 57 U/L (ref 49–397)

## 2023-11-20 LAB — PROTIME-INR
INR: 1 (ref 0.8–1.2)
Prothrombin Time: 13.4 s (ref 11.4–15.2)

## 2023-11-20 LAB — APTT: aPTT: 27 s (ref 24–36)

## 2023-11-20 LAB — CBG MONITORING, ED: Glucose-Capillary: 116 mg/dL — ABNORMAL HIGH (ref 70–99)

## 2023-11-20 LAB — TROPONIN I (HIGH SENSITIVITY): Troponin I (High Sensitivity): 58 ng/L — ABNORMAL HIGH (ref <18)

## 2023-11-20 LAB — ETHANOL: Alcohol, Ethyl (B): <10 mg/dL (ref <10)

## 2023-11-20 MED ORDER — ONDANSETRON HCL 4 MG/2ML IJ SOLN
4.0000 mg | Freq: Four times a day (QID) | INTRAMUSCULAR | Status: DC | PRN
  Administered 2023-11-22 (×2): 4 mg via INTRAVENOUS
  Filled 2023-11-20 (×3): qty 2

## 2023-11-20 MED ORDER — ONDANSETRON HCL 4 MG PO TABS: 4.0000 mg | ORAL_TABLET | Freq: Four times a day (QID) | ORAL | Status: DC | PRN

## 2023-11-20 MED ORDER — CLONAZEPAM 0.25 MG PO TBDP
0.2500 mg | ORAL_TABLET | Freq: Every day | ORAL | Status: DC
  Administered 2023-11-20 – 2023-11-22 (×3): 0.25 mg via ORAL
  Filled 2023-11-20 (×3): qty 1

## 2023-11-20 MED ORDER — PANTOPRAZOLE SODIUM 40 MG PO TBEC
40.0000 mg | DELAYED_RELEASE_TABLET | Freq: Every day | ORAL | Status: DC
  Administered 2023-11-20 – 2023-11-23 (×4): 40 mg via ORAL
  Filled 2023-11-20 (×4): qty 1

## 2023-11-20 MED ORDER — IOHEXOL 350 MG/ML SOLN
100.0000 mL | Freq: Once | INTRAVENOUS | Status: AC | PRN
  Administered 2023-11-20: 100 mL via INTRAVENOUS

## 2023-11-20 MED ORDER — CLOPIDOGREL BISULFATE 75 MG PO TABS
300.0000 mg | ORAL_TABLET | Freq: Once | ORAL | Status: AC
  Administered 2023-11-20: 300 mg via ORAL
  Filled 2023-11-20: qty 4

## 2023-11-20 MED ORDER — ASPIRIN 81 MG PO TBEC
81.0000 mg | DELAYED_RELEASE_TABLET | Freq: Every day | ORAL | Status: DC
  Administered 2023-11-20 – 2023-11-23 (×4): 81 mg via ORAL
  Filled 2023-11-20 (×4): qty 1

## 2023-11-20 MED ORDER — ESCITALOPRAM OXALATE 10 MG PO TABS
20.0000 mg | ORAL_TABLET | Freq: Every day | ORAL | Status: DC
  Administered 2023-11-20 – 2023-11-23 (×4): 20 mg via ORAL
  Filled 2023-11-20 (×4): qty 2

## 2023-11-20 MED ORDER — ACETAMINOPHEN 325 MG PO TABS
650.0000 mg | ORAL_TABLET | Freq: Four times a day (QID) | ORAL | Status: DC | PRN
  Filled 2023-11-20: qty 2

## 2023-11-20 MED ORDER — HEPARIN SODIUM (PORCINE) 5000 UNIT/ML IJ SOLN
5000.0000 [IU] | Freq: Two times a day (BID) | INTRAMUSCULAR | Status: DC
  Administered 2023-11-20 – 2023-11-23 (×6): 5000 [IU] via SUBCUTANEOUS
  Filled 2023-11-20 (×7): qty 1

## 2023-11-20 MED ORDER — SODIUM CHLORIDE 0.9 % IV BOLUS
1000.0000 mL | Freq: Once | INTRAVENOUS | Status: AC
Start: 2023-11-20 — End: 2023-11-20
  Administered 2023-11-20: 1000 mL via INTRAVENOUS

## 2023-11-20 MED ORDER — HYDRALAZINE HCL 20 MG/ML IJ SOLN
5.0000 mg | Freq: Four times a day (QID) | INTRAMUSCULAR | Status: DC | PRN
  Administered 2023-11-21: 5 mg via INTRAVENOUS
  Filled 2023-11-20: qty 1

## 2023-11-20 MED ORDER — CLOPIDOGREL BISULFATE 75 MG PO TABS
75.0000 mg | ORAL_TABLET | Freq: Every day | ORAL | Status: DC
  Administered 2023-11-21 – 2023-11-23 (×3): 75 mg via ORAL
  Filled 2023-11-20 (×3): qty 1

## 2023-11-20 MED ORDER — SODIUM CHLORIDE 0.9% FLUSH
3.0000 mL | Freq: Once | INTRAVENOUS | Status: AC
  Administered 2023-11-20: 3 mL via INTRAVENOUS

## 2023-11-20 MED ORDER — ACETAMINOPHEN 650 MG RE SUPP: 650.0000 mg | Freq: Four times a day (QID) | RECTAL | Status: DC | PRN

## 2023-11-20 MED ORDER — SODIUM CHLORIDE 0.9 % IV SOLN: INTRAVENOUS | Status: AC

## 2023-11-20 NOTE — ED Provider Notes (Signed)
 [facility] Provider Note    Event Date/Time   First MD Initiated Contact with Patient 11/20/23 1316     (approximate)   History   Leg Pain and balance issues   HPI  Howard Weaver is a 68 y.o. male with hypertension, depression, CKD who comes in with generalized weakness.  Patient reports falling 2 days ago and having bilateral leg weakness.  Patient reports having a history of stroke.  There was some concern of patient having waxing and waning of his symptoms and may be getting worse just prior to coming in therefore stroke code was called out in triage.  Patient denies any chest pain, shortness of breath, abdominal pain.  He states that his legs just feel weak.  He denies any pain in the legs.   Physical Exam   Triage Vital Signs: ED Triage Vitals  Encounter Vitals Group     BP 11/20/23 1342 (!) 76/47     Systolic BP Percentile --      Diastolic BP Percentile --      Pulse Rate 11/20/23 1253 61     Resp 11/20/23 1253 18     Temp 11/20/23 1253 97.9 F (36.6 C)     Temp Source 11/20/23 1253 Oral     SpO2 11/20/23 1253 100 %     Weight 11/20/23 1257 200 lb (90.7 kg)     Height 11/20/23 1257 5\' 9"  (1.753 m)     Head Circumference --      Peak Flow --      Pain Score 11/20/23 1256 0     Pain Loc --      Pain Education --      Exclude from Growth Chart --     Most recent vital signs: Vitals:   11/20/23 1345 11/20/23 1400  BP: (!) 87/66 93/71  Pulse: (!) 56 (!) 58  Resp: (!) 22 17  Temp:    SpO2:       General: Awake, no distress.  CV:  Good peripheral perfusion.  Resp:  Normal effort.  Abd:  No distention.  Soft and nontender Other:  Able to lift both legs up off the bed.  Equal strength bilaterally   ED Results / Procedures / Treatments   Labs (all labs ordered are listed, but only abnormal results are displayed) Labs Reviewed  CBC - Abnormal; Notable for the following components:      Result Value   RDW 16.0 (*)    All  other components within normal limits  COMPREHENSIVE METABOLIC PANEL - Abnormal; Notable for the following components:   Glucose, Bld 126 (*)    BUN 34 (*)    Creatinine, Ser 3.33 (*)    GFR, Estimated 19 (*)    All other components within normal limits  CBG MONITORING, ED - Abnormal; Notable for the following components:   Glucose-Capillary 116 (*)    All other components within normal limits  PROTIME-INR  APTT  DIFFERENTIAL  ETHANOL  CK  I-STAT CREATININE, ED  CBG MONITORING, ED     EKG  My interpretation of EKG:  Normal sinus rate of 56, bradycardic, no ST elevation, no T wave inversion, QTc 505  RADIOLOGY I have reviewed the CT head personally interpreted no evidence of intercranial hemorrhage   PROCEDURES:  Critical Care performed: No  .1-3 Lead EKG Interpretation  Performed by: Concha Se, MD Authorized by: Concha Se, MD     Interpretation: abnormal  ECG rate:  50   ECG rate assessment: bradycardic     Rhythm: sinus bradycardia     Ectopy: none     Conduction: normal   .Critical Care  Performed by: Concha Se, MD Authorized by: Concha Se, MD   Critical care provider statement:    Critical care time (minutes):  30   Critical care was necessary to treat or prevent imminent or life-threatening deterioration of the following conditions:  CNS failure or compromise   Critical care was time spent personally by me on the following activities:  Development of treatment plan with patient or surrogate, discussions with consultants, evaluation of patient's response to treatment, examination of patient, ordering and review of laboratory studies, ordering and review of radiographic studies, ordering and performing treatments and interventions, pulse oximetry, re-evaluation of patient's condition and review of old charts    MEDICATIONS ORDERED IN ED: Medications  sodium chloride flush (NS) 0.9 % injection 3 mL (3 mLs Intravenous Given 11/20/23 1336)   iohexol (OMNIPAQUE) 350 MG/ML injection 100 mL (100 mLs Intravenous Contrast Given 11/20/23 1321)  sodium chloride 0.9 % bolus 1,000 mL (1,000 mLs Intravenous New Bag/Given 11/20/23 1356)     IMPRESSION / MDM / ASSESSMENT AND PLAN / ED COURSE  I reviewed the triage vital signs and the nursing notes.   Patient's presentation is most consistent with acute presentation with potential threat to life or bodily function.   Patient called as a stroke code from triage given concern for posterior stroke.  CT head, CTa were limited due to artifact but nothing acute on it.  Discussed with neurology Dr. Amada Jupiter who does want MRI brain, cervical however patient out of the window for TNK no LVO on examination and CT imaging.  Patient's labs did result with L AKI.  Patient hypotensive.  Patient getting some IV fluid.  His sugar was were normal.  CBC reassuring  Given this we will discuss possible team for admission  The patient is on the cardiac monitor to evaluate for evidence of arrhythmia and/or significant heart rate changes.      FINAL CLINICAL IMPRESSION(S) / ED DIAGNOSES   Final diagnoses:  AKI (acute kidney injury) (HCC)  Weakness     Rx / DC Orders   ED Discharge Orders     None        Note:  This document was prepared using Dragon voice recognition software and may include unintentional dictation errors.   Concha Se, MD 11/20/23 [phone]

## 2023-11-20 NOTE — ED Notes (Signed)
 1309 Code stroke cart activated, Pt BIB EMS- reported LKW 2 days ago- pt fell d/t bilateral leg weakness but on assessment pt has slurred speech and aphasia- when asked when that started, pt states this morning when he woke then said no, it was 2 hours ago. LKW unknown at this time. Pt already in CT when activated. 1311 Dr Amada Jupiter, teleneuro paged [phone] Dr Amada Jupiter at bedside in CT, examined pt. LKW unknown, no TNK

## 2023-11-20 NOTE — H&P (Signed)
 History and Physical    Howard Weaver:[id number] DOB: 1956-02-26 DOA: 11/20/2023  PCP: Sallyanne Kuster, NP (Confirm with patient/family/NH records and if not entered, this has to be entered at [facility] point of entry) Patient coming from: Home  I have personally briefly reviewed patient's old medical records in [facility]  Chief Complaint: Leg pain and weakness  HPI: Howard Weaver is a 68 y.o. male with medical history significant of HTN, HLD, CKD stage IIIa, CAD, anxiety/depression, presented with worsening of bilateral lower extremity weakness and pain.  Symptoms started 1 week ago, patient started to develop bilateral lower extremity weakness especially the bilateral thigh, denied any decrease of sensation, no urinary or bowel movement issue.  Denied any lightheadedness fall, nauseous vomiting or diarrhea.  He does admit that he has been having poor appetite but denied any decreased urine output or dark-colored urine.  No changes of his BP medications.  ED Course: Afebrile, blood pressure 70/40 responded to IV bolus and improved to 93/71 not hypoxic.  CT head negative for acute findings blood work showed creatinine 3.3 compared to baseline 1.3 BUN 34, bicarb 22K 4.3 hemoglobin 15.4, WBC 7.1.  Review of Systems: As per HPI otherwise 14 point review of systems negative.    Past Medical History:  Diagnosis Date   Appendicitis 09/24/2021   Bleeding stomach ulcer 2018   Brain aneurysm 2014   CAD (coronary artery disease)    Chest pain    CKD (chronic kidney disease) stage 3, GFR 30-59 ml/min (HCC)    Depression    DU (duodenal ulcer)    DVT of leg (deep venous thrombosis) (HCC) 01/16/2020   GAD (generalized anxiety disorder)    Hyperlipidemia    Hypertension    Myocardial infarction (HCC)    2017 & 2019   Radiculopathy of thoracolumbar region    Stroke [facility])    2014 & 2021   SVT (supraventricular tachycardia) (HCC)    Tachycardia     Past Surgical History:   Procedure Laterality Date   CARDIAC CATHETERIZATION Right 11/25/2015   Procedure: Left Heart Cath and Coronary Angiography;  Surgeon: Laurier Nancy, MD;  Location: [facility] INVASIVE CV LAB;  Service: Cardiovascular;  Laterality: Right;   CEREBRAL ANEURYSM REPAIR  2014   ESOPHAGOGASTRODUODENOSCOPY  04/12/2014   STOMACH SURGERY     bleeding ulcer repair   TEE WITHOUT CARDIOVERSION Right 01/17/2020   Procedure: TRANSESOPHAGEAL ECHOCARDIOGRAM (TEE);  Surgeon: Laurier Nancy, MD;  Location: [facility] ORS;  Service: Cardiovascular;  Laterality: Right;   XI ROBOTIC LAPAROSCOPIC ASSISTED APPENDECTOMY N/A 11/11/2021   Procedure: XI ROBOTIC LAPAROSCOPIC ASSISTED APPENDECTOMY;  Surgeon: [name], MD;  Location: [facility] ORS;  Service: General;  Laterality: N/A;     reports that he quit smoking about 48 years ago. His smoking use included cigarettes. He has never used smokeless tobacco. He reports that he does not currently use alcohol. He reports that he does not use drugs.  Allergies  Allergen Reactions   Penicillins Anaphylaxis    Has patient had a PCN reaction causing immediate rash, facial/tongue/throat swelling, SOB or lightheadedness with hypotension: Yes Has patient had a PCN reaction causing severe rash involving mucus membranes or skin necrosis: Yes Has patient had a PCN reaction that required hospitalization Yes Has patient had a PCN reaction occurring within the last 10 years: No If all of the above answers are "NO", then may proceed with Cephalosporin use.    Ibuprofen Other (See Comments)    Gi  upset    Family History  Problem Relation Age of Onset   Hypertension Mother    Diabetes Mother    Depression Mother    Hypertension Father      Prior to Admission medications   Medication Sig Start Date End Date Taking? Authorizing Provider  aspirin EC 81 MG tablet Take 1 tablet (81 mg total) by mouth daily. 10/05/23   Arnetha Courser, MD  clonazePAM (KLONOPIN) 0.5 MG tablet Take(0.25 mg )  1/2  tablet daily. 10/18/23   Sallyanne Kuster, NP  cloNIDine (CATAPRES) 0.3 MG tablet Take 1 tablet (0.3 mg total) by mouth at bedtime. 10/05/23   Arnetha Courser, MD  enalapril (VASOTEC) 20 MG tablet Take 2 tablets (40 mg total) by mouth daily. 10/05/23   Arnetha Courser, MD  escitalopram (LEXAPRO) 20 MG tablet Take 1 tablet (20 mg total) by mouth daily. 10/05/23   Arnetha Courser, MD  isosorbide mononitrate (IMDUR) 30 MG 24 hr tablet Take 1 tablet (30 mg total) by mouth daily. 10/05/23   Arnetha Courser, MD  metoprolol succinate (TOPROL-XL) 100 MG 24 hr tablet Take 1 tablet (100 mg total) by mouth daily. Take with or immediately following a meal. 10/05/23   Arnetha Courser, MD  pantoprazole (PROTONIX) 40 MG tablet Take 1 tablet (40 mg total) by mouth daily. 10/18/23   Sallyanne Kuster, NP  rosuvastatin (CRESTOR) 40 MG tablet Take 1 tablet (40 mg total) by mouth daily. 10/05/23   Arnetha Courser, MD    Physical Exam: Vitals:   11/20/23 1257 11/20/23 1342 11/20/23 1345 11/20/23 1400  BP:  (!) 76/47 (!) 87/66 93/71  Pulse:  60 (!) 56 (!) 58  Resp:  10 (!) 22 17  Temp:      TempSrc:      SpO2:    100%  Weight: 90.7 kg     Height: 5\' 9"  (1.753 m)       Constitutional: NAD, calm, comfortable Vitals:   11/20/23 1257 11/20/23 1342 11/20/23 1345 11/20/23 1400  BP:  (!) 76/47 (!) 87/66 93/71  Pulse:  60 (!) 56 (!) 58  Resp:  10 (!) 22 17  Temp:      TempSrc:      SpO2:    100%  Weight: 90.7 kg     Height: 5\' 9"  (1.753 m)      Eyes: PERRL, lids and conjunctivae normal ENMT: Mucous membranes are moist. Posterior pharynx clear of any exudate or lesions.Normal dentition.  Neck: normal, supple, no masses, no thyromegaly Respiratory: clear to auscultation bilaterally, no wheezing, no crackles. Normal respiratory effort. No accessory muscle use.  Cardiovascular: Regular rate and rhythm, no murmurs / rubs / gallops. No extremity edema. 2+ pedal pulses. No carotid bruits.  Abdomen: no tenderness, no masses palpated.  No hepatosplenomegaly. Bowel sounds positive.  Musculoskeletal: no clubbing / cyanosis. No joint deformity upper and lower extremities. Good ROM, no contractures. Normal muscle tone.  Skin: no rashes, lesions, ulcers. No induration Neurologic: CN 2-12 grossly intact. Sensation intact, DTR normal.  Strength 4/5 on bilateral thighs, decreased light touch sensation of bilateral thigh area, other muscle groups strength 5/5 on bilateral upper and lower extremities Psychiatric: Normal judgment and insight. Alert and oriented x 3. Normal mood.     Labs on Admission: I have personally reviewed following labs and imaging studies  CBC: Recent Labs  Lab 11/20/23 1305  WBC 7.1  NEUTROABS 4.6  HGB 15.4  HCT 47.1  MCV 86.3  PLT 226   Basic  Metabolic Panel: Recent Labs  Lab 11/20/23 1305  NA 135  K 4.3  CL 103  CO2 22  GLUCOSE 126*  BUN 34*  CREATININE 3.33*  CALCIUM 9.6   GFR: Estimated Creatinine Clearance: 23.6 mL/min (A) (by C-G formula based on SCr of 3.33 mg/dL (H)). Liver Function Tests: Recent Labs  Lab 11/20/23 1305  AST 17  ALT 11  ALKPHOS 62  BILITOT 0.9  PROT 7.8  ALBUMIN 3.8   No results for input(s): "LIPASE", "AMYLASE" in the last 168 hours. No results for input(s): "AMMONIA" in the last 168 hours. Coagulation Profile: Recent Labs  Lab 11/20/23 1305  INR 1.0   Cardiac Enzymes: No results for input(s): "CKTOTAL", "CKMB", "CKMBINDEX", "TROPONINI" in the last 168 hours. BNP (last 3 results) No results for input(s): "PROBNP" in the last 8760 hours. HbA1C: No results for input(s): "HGBA1C" in the last 72 hours. CBG: Recent Labs  Lab 11/20/23 1305  GLUCAP 116*   Lipid Profile: No results for input(s): "CHOL", "HDL", "LDLCALC", "TRIG", "CHOLHDL", "LDLDIRECT" in the last 72 hours. Thyroid Function Tests: No results for input(s): "TSH", "T4TOTAL", "FREET4", "T3FREE", "THYROIDAB" in the last 72 hours. Anemia Panel: No results for input(s): "VITAMINB12",  "FOLATE", "FERRITIN", "TIBC", "IRON", "RETICCTPCT" in the last 72 hours. Urine analysis:    Component Value Date/Time   COLORURINE YELLOW (A) 04/15/2023 0229   APPEARANCEUR CLEAR (A) 04/15/2023 0229   APPEARANCEUR Clear 08/12/2020 1611   LABSPEC 1.043 (H) 04/15/2023 0229   LABSPEC 1.035 05/01/2014 0939   PHURINE 6.0 04/15/2023 0229   GLUCOSEU NEGATIVE 04/15/2023 0229   GLUCOSEU Negative 05/01/2014 0939   HGBUR NEGATIVE 04/15/2023 0229   BILIRUBINUR NEGATIVE 04/15/2023 0229   BILIRUBINUR Negative 08/12/2020 1611   BILIRUBINUR Negative 05/01/2014 0939   KETONESUR NEGATIVE 04/15/2023 0229   PROTEINUR NEGATIVE 04/15/2023 0229   NITRITE NEGATIVE 04/15/2023 0229   LEUKOCYTESUR NEGATIVE 04/15/2023 0229   LEUKOCYTESUR Negative 05/01/2014 0939    Radiological Exams on Admission: CT ANGIO HEAD NECK W WO CM W PERF (CODE STROKE) Result Date: 11/20/2023 CLINICAL DATA:  Leg weakness.  Code stroke. EXAM: CT ANGIOGRAPHY HEAD AND NECK CT PERFUSION BRAIN TECHNIQUE: Multidetector CT imaging of the head and neck was performed using the standard protocol during bolus administration of intravenous contrast. Multiplanar CT image reconstructions and MIPs were obtained to evaluate the vascular anatomy. Carotid stenosis measurements (when applicable) are obtained utilizing NASCET criteria, using the distal internal carotid diameter as the denominator. Multiphase CT imaging of the brain was performed following IV bolus contrast injection. Subsequent parametric perfusion maps were calculated using RAPID software. RADIATION DOSE REDUCTION: This exam was performed according to the departmental dose-optimization program which includes automated exposure control, adjustment of the mA and/or kV according to patient size and/or use of iterative reconstruction technique. CONTRAST:  OMNIPAQUE IOHEXOL 350 MG/ML SOLN COMPARISON:  CT head without contrast 11/20/2023. CT angio head 01/15/2020. FINDINGS: CTA NECK FINDINGS The  timing of the contrast bolus is suboptimal. Contrast is densest within the right atrium. Aortic arch: Atherosclerotic calcifications are present at the aortic arch and great vessel origins. No significant stenosis is present. No aneurysm or dissection is present. Right carotid system: Mild irregular wall thickening is present throughout the right common carotid artery. No focal stenosis is present. Atherosclerotic changes are present at the aortic arch. Proximal right ICA measures 2 mm. This compares with a more distal measurement of 5 mm. Left carotid system: The left common carotid artery is within normal limits. Atherosclerotic calcifications  are present at the aortic arch the proximal right ICA scratched at the proximal left ICA is narrowed to 2.7 mm. No significant stenosis of greater than 50% is present. Cervical left ICA is otherwise normal. Vertebral arteries: The vertebral arteries are codominant. Both vertebral arteries originate from the subclavian arteries. Decreased scratched a decreased contrast density is present throughout the right vertebral artery without a focal stenosis. No significant stenosis is present on the left. Skeleton: The vertebral body heights are normal. Other neck: The soft tissues of the neck are otherwise unremarkable. Salivary glands are within normal limits. Thyroid is normal. No significant adenopathy is present. No focal mucosal or submucosal lesions are present. Upper chest: Lung apices are clear bilaterally. Thoracic inlet is within normal limits. Review of the MIP images confirms the above findings CTA HEAD FINDINGS Anterior circulation: Atherosclerotic calcifications are present within the cavernous internal carotid arteries bilaterally. No significant stenosis is present through the ICA termini. The A1 and M1 segments are normal. The anterior communicating artery is patent. Marked attenuation of ACA and MCA branch vessels bilaterally is likely secondary to poor cardiac  output. No focal stenosis or branch vessel occlusion is present. Posterior circulation: Moderate stenosis is present the distal right V4 segment. This may account for decreased density in the more proximal right vertebral artery. Calcifications are present in the left V4 segment without focal stenosis. The vertebrobasilar junction and basilar artery normal. Both posterior cerebral arteries originate from basilar tip. Moderate 10 UA shin of PCA branch vessels is noted bilaterally, again likely secondary to poor cardiac output. Venous sinuses: The dural sinuses are patent. The straight sinus and deep cerebral veins are intact. Cortical veins are within normal limits. No significant vascular malformation is evident. Anatomic variants: None Review of the MIP images confirms the above findings CT Brain Perfusion Findings: ASPECTS: 10/10 The CT perfusion maps suggest ischemia predominantly within the cerebellum. This is likely artifactual. Core infarct is mapped to a remote infarct. Moderate patient motion is present. IMPRESSION: 1. Suboptimal timing of the contrast bolus limits evaluation of branch vessels. 2. Atherosclerotic changes at the cavernous internal carotid arteries bilaterally without significant stenosis. 3. Moderate stenosis of the distal right V4 segment. This may account for decreased density in the more proximal right vertebral artery. 4. CT perfusion maps suggest ischemia predominantly within the cerebellum. Significant patient motion is present on the CT perfusion images. The results are likely artifactual. 5.  Aortic Atherosclerosis (ICD10-I70.0). These results were called by telephone at the time of interpretation on 11/20/2023 at 1:51 pm to provider Dr. Amada Jupiter, who verbally acknowledged these results. Electronically Signed   By: Marin Roberts M.D.   On: 11/20/2023 13:51   CT HEAD CODE STROKE WO CONTRAST Result Date: 11/20/2023 CLINICAL DATA:  Code stroke. Slurred speech. Right-sided  facial droop. EXAM: CT HEAD WITHOUT CONTRAST TECHNIQUE: Contiguous axial images were obtained from the base of the skull through the vertex without intravenous contrast. RADIATION DOSE REDUCTION: This exam was performed according to the departmental dose-optimization program which includes automated exposure control, adjustment of the mA and/or kV according to patient size and/or use of iterative reconstruction technique. COMPARISON:  CT head without contrast 09/02/2022. FINDINGS: Brain: Chronic left cerebellar encephalomalacia is stable. A remote cortical infarct in the anterior right frontal lobe is stable. Mild atrophy and moderate diffuse white matter disease is similar the prior study. No acute infarct, hemorrhage, or mass lesion is present. Deep brain nuclei are within normal limits. The ventricles are of normal  size. No significant extraaxial fluid collection is present. Mild ex vacuo dilation of the right lateral ventricle is stable. No significant extraaxial fluid collection is present. The brainstem and right cerebellum are within normal limits. A mildly enlarged empty sella is stable. Midline structures are otherwise within normal limits. Vascular: Atherosclerotic calcifications are present within the cavernous internal carotid arteries and at the dural margin of both vertebral arteries. No hyperdense vessel is present. Skull: Calvarium is otherwise intact. The small lipoma is present in the left temporal scalp stable. Scalp is otherwise unremarkable. Sinuses/Orbits: Mild mucosal thickening is present along the inferior left maxillary sinus. The paranasal sinuses and mastoid air cells are otherwise clear. The globes and orbits are within normal limits. ASPECTS [facility] Stroke Program Early CT Score) - Ganglionic level infarction (caudate, lentiform nuclei, internal capsule, insula, M1-M3 cortex): 7/7 - Supraganglionic infarction (M4-M6 cortex): 3/3 Total score (0-10 with 10 being normal): 10/10  IMPRESSION: 1. No acute intracranial abnormality or significant interval change. 2. Stable chronic left cerebellar encephalomalacia. 3. Stable remote cortical infarct of the anterior right frontal lobe. 4. Stable mild atrophy and moderate diffuse white matter disease. This likely reflects the sequela of chronic microvascular ischemia. 5. Aspects is 10/10. The above was relayed via text pager to Dr. Amada Jupiter on 11/20/2023 at 13:21 . Electronically Signed   By: Marin Roberts M.D.   On: 11/20/2023 13:22    EKG: Independently reviewed.  Sinus rhythm, no acute ST changes.  Assessment/Plan Principal Problem:   AKI (acute kidney injury) (HCC) Active Problems:   Weakness  (please populate well all problems here in Problem List. (For example, if patient is on BP meds at home and you resume or decide to hold them, it is a problem that needs to be.. Same for CAD, COPD, HLD and so on)  Bilateral lower extremity weakness -Brain MRI pending to rule out stroke -Given the focal symmetrical neurological finding, clinically also suspect lumbar radiculopathy L2-L4 level.  Patient does have chronic back pain but no previous workup for lumbar radiculopathy.  Will check lumbar x-ray to further characterize. -Other DDx, will check CK to rule out myositis  AKI -Likely prerenal secondary to hypotension.  Hopefully not ATN -Check renal ultrasound -Continue maintenance IV fluid and recheck kidney function tomorrow  Hypotension -Clinically appears to be volume contracted, probably secondary to poor intake -IV fluid -Low suspicion for sepsis, will check UA.  Chest x-ray shows no acute infiltrates and patient does not have any cough.  HTN -Hold off home BP meds including clonidine, metoprolol, enalapril and Imdur -Echocardiogram showed normal LVEF but grade 1 diastolic dysfunction on recent echo   DVT prophylaxis: Heparin subcu Code Status: Full code Family Communication: None at bedside Disposition  Plan: Expect less than 2 midnight hospital stay Consults called: None Admission status: Telemetry observation   Emeline General MD [facility] Pager [phone]  11/20/2023, 2:43 PM

## 2023-11-20 NOTE — Consult Note (Signed)
 NEUROLOGY CONSULT NOTE   Date of service: November 20, 2023 Patient Name: Howard Weaver MRN:  [id number] DOB:  11/05/1955 Chief Complaint: "Left-sided weakness" Requesting Provider: Concha Se, MD  History of Present Illness  Howard Weaver is a 68 y.o. male with hx of left leg weakness which has been going on for approximately 2 weeks who today noticed more slurred speech and more generalized weakness.  Due to the symptoms he sought care in emergency department where is noted that he was having a waxing/waning dysarthria and therefore code stroke was activated.  The patient states that he does feel like things are worse today than they have been, but does not note any abrupt change since waking up.  LKW: 2 weeks ago IV Thrombolysis: Outside of window EVT: No, no LVO   NIHSS components Score: Comment  1a Level of Conscious 0[x]  1[]  2[]  3[]      1b LOC Questions 0[x]  1[]  2[]       1c LOC Commands 0[x]  1[]  2[]       2 Best Gaze 0[x]  1[]  2[]       3 Visual 0[x]  1[]  2[]  3[]      4 Facial Palsy 0[x]  1[]  2[]  3[]      5a Motor Arm - left 0[]  1[x]  2[]  3[]  4[]  UN[]    5b Motor Arm - Right 0[]  1[x]  2[]  3[]  4[]  UN[]    6a Motor Leg - Left 0[]  1[x]  2[]  3[]  4[]  UN[]    6b Motor Leg - Right 0[]  1[x]  2[]  3[]  4[]  UN[]    7 Limb Ataxia 0[]  1[x]  2[]  3[]  UN[]     8 Sensory 0[x]  1[]  2[]  UN[]      9 Best Language 0[x]  1[]  2[]  3[]      10 Dysarthria 0[]  1[x]  2[]  UN[]      11 Extinct. and Inattention 0[x]  1[]  2[]       TOTAL: 6     Past History   Past Medical History:  Diagnosis Date   Appendicitis 09/24/2021   Bleeding stomach ulcer 2018   Brain aneurysm 2014   CAD (coronary artery disease)    Chest pain    CKD (chronic kidney disease) stage 3, GFR 30-59 ml/min (HCC)    Depression    DU (duodenal ulcer)    DVT of leg (deep venous thrombosis) (HCC) 01/16/2020   GAD (generalized anxiety disorder)    Hyperlipidemia    Hypertension    Myocardial infarction (HCC)    2017 & 2019   Radiculopathy of  thoracolumbar region    Stroke [facility])    2014 & 2021   SVT (supraventricular tachycardia) (HCC)    Tachycardia     Past Surgical History:  Procedure Laterality Date   CARDIAC CATHETERIZATION Right 11/25/2015   Procedure: Left Heart Cath and Coronary Angiography;  Surgeon: Laurier Nancy, MD;  Location: [facility] INVASIVE CV [facility];  Service: Cardiovascular;  Laterality: Right;   CEREBRAL ANEURYSM REPAIR  2014   ESOPHAGOGASTRODUODENOSCOPY  04/12/2014   STOMACH SURGERY     bleeding ulcer repair   TEE WITHOUT CARDIOVERSION Right 01/17/2020   Procedure: TRANSESOPHAGEAL ECHOCARDIOGRAM (TEE);  Surgeon: Laurier Nancy, MD;  Location: [facility] ORS;  Service: Cardiovascular;  Laterality: Right;   XI ROBOTIC LAPAROSCOPIC ASSISTED APPENDECTOMY N/A 11/11/2021   Procedure: XI ROBOTIC LAPAROSCOPIC ASSISTED APPENDECTOMY;  Surgeon: [name], MD;  Location: [facility] ORS;  Service: General;  Laterality: N/A;    Family History: Family History  Problem Relation Age of Onset   Hypertension Mother    Diabetes Mother  Depression Mother    Hypertension Father     Social History  reports that he quit smoking about 48 years ago. His smoking use included cigarettes. He has never used smokeless tobacco. He reports that he does not currently use alcohol. He reports that he does not use drugs.  Allergies  Allergen Reactions   Penicillins Anaphylaxis    Has patient had a PCN reaction causing immediate rash, facial/tongue/throat swelling, SOB or lightheadedness with hypotension: Yes Has patient had a PCN reaction causing severe rash involving mucus membranes or skin necrosis: Yes Has patient had a PCN reaction that required hospitalization Yes Has patient had a PCN reaction occurring within the last 10 years: No If all of the above answers are "NO", then may proceed with Cephalosporin use.    Ibuprofen Other (See Comments)    Gi upset    Medications  No current facility-administered medications for this  encounter.  Current Outpatient Medications:    aspirin EC 81 MG tablet, Take 1 tablet (81 mg total) by mouth daily., Disp: 90 tablet, Rfl: 1   clonazePAM (KLONOPIN) 0.5 MG tablet, Take(0.25 mg ) 1/2  tablet daily., Disp: 90 tablet, Rfl: 0   cloNIDine (CATAPRES) 0.3 MG tablet, Take 1 tablet (0.3 mg total) by mouth at bedtime., Disp: 90 tablet, Rfl: 1   enalapril (VASOTEC) 20 MG tablet, Take 2 tablets (40 mg total) by mouth daily., Disp: 180 tablet, Rfl: 1   escitalopram (LEXAPRO) 20 MG tablet, Take 1 tablet (20 mg total) by mouth daily., Disp: 90 tablet, Rfl: 1   isosorbide mononitrate (IMDUR) 30 MG 24 hr tablet, Take 1 tablet (30 mg total) by mouth daily., Disp: 90 tablet, Rfl: 1   metoprolol succinate (TOPROL-XL) 100 MG 24 hr tablet, Take 1 tablet (100 mg total) by mouth daily. Take with or immediately following a meal., Disp: 90 tablet, Rfl: 1   pantoprazole (PROTONIX) 40 MG tablet, Take 1 tablet (40 mg total) by mouth daily., Disp: 90 tablet, Rfl: 1   rosuvastatin (CRESTOR) 40 MG tablet, Take 1 tablet (40 mg total) by mouth daily., Disp: 90 tablet, Rfl: 1  Vitals   Vitals:   11/20/23 1253 11/20/23 1257 11/20/23 1342  BP:   (!) 76/47  Pulse: 61  60  Resp: 18  10  Temp: 97.9 F (36.6 C)    TempSrc: Oral    SpO2: 100%    Weight:  90.7 kg   Height:  5\' 9"  (1.753 m)     Body mass index is 29.53 kg/m.  Physical Exam   Constitutional: Appears well-developed and well-nourished.  Neurologic Examination    Neuro: Mental Status: Patient is awake, alert, oriented to person, place, month, year, and situation. Patient is able to give a clear and coherent history. No signs of aphasia or neglect Cranial Nerves: II: Visual Fields are full. Pupils are equal, round, and reactive to light.   III,IV, VI: EOMI without ptosis or diploplia.  V: Facial sensation is symmetric to temperature VII: Facial movement is symmetric.  VIII: hearing is intact to voice X: Uvula elevates  symmetrically XII: tongue is midline without atrophy or fasciculations.  Motor: He has some degree of weakness of all four of his extremities, when trying to hold his right arm aloft, it does fall to the side, he has 4+/5 strength to confrontation in bilateral arms, 4 -/5 in bilateral legs, may be slightly worse on the left Sensory: Sensation is symmetric to light touch and temperature in the arms and  legs. Deep Tendon Reflexes: Patellar reflexes are not particularly brisk, no clonus Plantars: Toes are equivocal bilaterally Cerebellar: He has some degree of difficulty consistent with weakness in all of his extremities except his right arm which seems ataxic        Labs/Imaging/Neurodiagnostic studies   CBC:  Recent Labs  Lab 03-Dec-2023 1305  WBC 7.1  NEUTROABS 4.6  HGB 15.4  HCT 47.1  MCV 86.3  PLT 226   Basic Metabolic Panel:  Lab Results  Component Value Date   NA 135 2023-12-03   K 4.3 12/03/23   CO2 22 12/03/2023   GLUCOSE 126 (H) 03-Dec-2023   BUN 34 (H) Dec 03, 2023   CREATININE 3.33 (H) 12/03/2023   CALCIUM 9.6 12-03-23   GFRNONAA 19 (L) Dec 03, 2023   GFRAA >60 01/15/2020   Lipid Panel:  Lab Results  Component Value Date   LDLCALC 92 01/16/2020   HgbA1c:  Lab Results  Component Value Date   HGBA1C 6.3 (H) 01/16/2020   Urine Drug Screen:     Component Value Date/Time   LABOPIA NONE DETECTED 01/15/2020 1813   COCAINSCRNUR NONE DETECTED 01/15/2020 1813   LABBENZ NONE DETECTED 01/15/2020 1813   AMPHETMU NONE DETECTED 01/15/2020 1813   THCU NONE DETECTED 01/15/2020 1813   LABBARB NONE DETECTED 01/15/2020 1813    Alcohol Level     Component Value Date/Time   ETH <10 12-03-2023 1305   INR  Lab Results  Component Value Date   INR 1.0 12-03-23   APTT  Lab Results  Component Value Date   APTT 27 2023-12-03   AED levels: No results found for: "PHENYTOIN", "ZONISAMIDE", "LAMOTRIGINE", "LEVETIRACETA"  CT Head without contrast(Personally  reviewed): Negative  CT angio Head and Neck with contrast(Personally reviewed): Poor bolus timing, but no LVO ASSESSMENT   Howard Weaver is a 68 y.o. male with worsening generalized weakness in the setting of AKI and hypotension.  I am actually quite concerned about possible watershed infarcts, and an MRI is necessary.  Certainly, other etiologies of infarct are also possible and this should be worked up.  It is possible that AKI alone is responsible for his symptoms, but given the duration of symptoms of his left leg I think this is less likely.  RECOMMENDATIONS  - HgbA1c, fasting lipid panel - MRI of the brain without contrast - Frequent neuro checks - Echocardiogram - Prophylactic therapy-Antiplatelet med: Aspirin - dose 81mg  and plavix 75mg  daily  after 300mg  load  - Risk factor modification - Telemetry monitoring - PT consult, OT consult, Speech consult -Neurology to follow  ______________________________________________________________________    Signed, Ritta Slot, MD [facility]

## 2023-11-20 NOTE — ED Notes (Signed)
 Code Stroke was called to [facility] at 1:00 pm and spoke to [place].

## 2023-11-20 NOTE — Progress Notes (Signed)
 Responded to code stroke paged out- no present needs at this time. Medical team actively with pt. Please page chaplain services if needs arise.

## 2023-11-20 NOTE — ED Triage Notes (Signed)
 Pt presents to the ED via [facility] from home. Per EMS pt fell two days ago and is now having bilateral leg pain. Pt states that he has a hx of a stroke   BP 111/80 61 HR 91 % RA  RN notified Dr. Marisa Severin

## 2023-11-21 DIAGNOSIS — N179 Acute kidney failure, unspecified: Secondary | ICD-10-CM | POA: Diagnosis not present

## 2023-11-21 DIAGNOSIS — R531 Weakness: Secondary | ICD-10-CM | POA: Diagnosis not present

## 2023-11-21 DIAGNOSIS — I9589 Other hypotension: Secondary | ICD-10-CM

## 2023-11-21 DIAGNOSIS — I639 Cerebral infarction, unspecified: Secondary | ICD-10-CM | POA: Diagnosis not present

## 2023-11-21 DIAGNOSIS — I6389 Other cerebral infarction: Secondary | ICD-10-CM | POA: Diagnosis not present

## 2023-11-21 LAB — LIPID PANEL
Cholesterol: 100 mg/dL (ref 0–200)
HDL: 25 mg/dL — ABNORMAL LOW (ref >40)
LDL Cholesterol: 51 mg/dL (ref 0–99)
Total CHOL/HDL Ratio: 4 ratio
Triglycerides: 122 mg/dL (ref <150)
VLDL: 24 mg/dL (ref 0–40)

## 2023-11-21 LAB — BASIC METABOLIC PANEL
Anion gap: 9 (ref 5–15)
BUN: 38 mg/dL — ABNORMAL HIGH (ref 8–23)
CO2: 20 mmol/L — ABNORMAL LOW (ref 22–32)
Calcium: 8.5 mg/dL — ABNORMAL LOW (ref 8.9–10.3)
Chloride: 107 mmol/L (ref 98–111)
Creatinine, Ser: 2.63 mg/dL — ABNORMAL HIGH (ref 0.61–1.24)
GFR, Estimated: 26 mL/min — ABNORMAL LOW (ref >60)
Glucose, Bld: 101 mg/dL — ABNORMAL HIGH (ref 70–99)
Potassium: 3.8 mmol/L (ref 3.5–5.1)
Sodium: 136 mmol/L (ref 135–145)

## 2023-11-21 LAB — HEMOGLOBIN A1C
Hgb A1c MFr Bld: 5.7 % — ABNORMAL HIGH (ref 4.8–5.6)
Mean Plasma Glucose: 116.89 mg/dL

## 2023-11-21 MED ORDER — SODIUM CHLORIDE 0.9 % IV SOLN: INTRAVENOUS | Status: DC

## 2023-11-21 MED ORDER — HYDRALAZINE HCL 20 MG/ML IJ SOLN
5.0000 mg | Freq: Four times a day (QID) | INTRAMUSCULAR | Status: DC | PRN
  Administered 2023-11-21 – 2023-11-22 (×3): 5 mg via INTRAVENOUS
  Filled 2023-11-21 (×4): qty 1

## 2023-11-21 NOTE — Consult Note (Signed)
 Howard Weaver is a 68 y.o. male  [id number]  Primary Cardiologist: Adrian Blackwater Reason for Consultation: Consult for transesophageal echocardiogram  HPI: This is a 68 year old white male with a past medical history of hypertension hyperlipidemia myocardial infarction coronary artery disease presented with embolic stroke and I was asked to evaluate the patient for possible TEE to tomorrow morning.   Review of Systems: No chest pain   Past Medical History:  Diagnosis Date   Appendicitis 09/24/2021   Bleeding stomach ulcer 2018   Brain aneurysm 2014   CAD (coronary artery disease)    Chest pain    CKD (chronic kidney disease) stage 3, GFR 30-59 ml/min (HCC)    Depression    DU (duodenal ulcer)    DVT of leg (deep venous thrombosis) (HCC) 01/16/2020   GAD (generalized anxiety disorder)    Hyperlipidemia    Hypertension    Myocardial infarction [facility])    2017 & 2019   Radiculopathy of thoracolumbar region    Stroke [facility])    2014 & 2021   SVT (supraventricular tachycardia) (HCC)    Tachycardia     (Not in a hospital admission)     aspirin EC  81 mg Oral Daily   clonazePAM  0.25 mg Oral QHS   clopidogrel  75 mg Oral Daily   escitalopram  20 mg Oral Daily   heparin  5,000 Units Subcutaneous Q12H   pantoprazole  40 mg Oral Daily    Infusions:  sodium chloride 125 mL/hr at 11/21/23 1032    Allergies  Allergen Reactions   Penicillins Anaphylaxis    Has patient had a PCN reaction causing immediate rash, facial/tongue/throat swelling, SOB or lightheadedness with hypotension: Yes Has patient had a PCN reaction causing severe rash involving mucus membranes or skin necrosis: Yes Has patient had a PCN reaction that required hospitalization Yes Has patient had a PCN reaction occurring within the last 10 years: No If all of the above answers are "NO", then may proceed with Cephalosporin use.    Ibuprofen Other (See Comments)    Gi upset    Social History    Socioeconomic History   Marital status: Legally Separated    Spouse name: Not on file   Number of children: Not on file   Years of education: Not on file   Highest education level: Not on file  Occupational History   Not on file  Tobacco Use   Smoking status: Former    Current packs/day: 0.00    Types: Cigarettes    Quit date: 11/25/1975    Years since quitting: 48.0   Smokeless tobacco: Never  Vaping Use   Vaping status: Never Used  Substance and Sexual Activity   Alcohol use: Not Currently   Drug use: No   Sexual activity: Not on file  Other Topics Concern   Not on file  Social History Narrative   Lives with sister in law   Social Drivers of Health   Financial Resource Strain: Not on file  Food Insecurity: No Food Insecurity (10/03/2023)   Hunger Vital Sign    Worried About Running Out of Food in the Last Year: Never true    Ran Out of Food in the Last Year: Never true  Transportation Needs: No Transportation Needs (10/03/2023)   PRAPARE - Administrator, Civil Service (Medical): No    Lack of Transportation (Non-Medical): No  Physical Activity: Not on file  Stress: Not on file  Social  Connections: Moderately Integrated (10/03/2023)   Social Connection and Isolation Panel [NHANES]    Frequency of Communication with Friends and Family: More than three times a week    Frequency of Social Gatherings with Friends and Family: More than three times a week    Attends Religious Services: 1 to 4 times per year    Active Member of Clubs or Organizations: No    Attends Banker Meetings: 1 to 4 times per year    Marital Status: Divorced  Catering manager Violence: Not At Risk (10/03/2023)   Humiliation, Afraid, Rape, and Kick questionnaire    Fear of Current or Ex-Partner: No    Emotionally Abused: No    Physically Abused: No    Sexually Abused: No    Family History  Problem Relation Age of Onset   Hypertension Mother    Diabetes Mother     Depression Mother    Hypertension Father     PHYSICAL EXAM: Vitals:   11/21/23 1018 11/21/23 1100  BP: (!) 138/97 119/88  Pulse: 64 65  Resp: (!) 23 (!) 25  Temp:    SpO2: 94% 99%     Intake/Output Summary (Last 24 hours) at 11/21/2023 1206 Last data filed at 11/21/2023 1032 Gross per 24 hour  Intake 1997.93 ml  Output 950 ml  Net 1047.93 ml    General:  Well appearing. No respiratory difficulty HEENT: normal Neck: supple. no JVD. Carotids 2+ bilat; no bruits. No lymphadenopathy or thryomegaly appreciated. Cor: PMI nondisplaced. Regular rate & rhythm. No rubs, gallops or murmurs. Lungs: clear Abdomen: soft, nontender, nondistended. No hepatosplenomegaly. No bruits or masses. Good bowel sounds. Extremities: no cyanosis, clubbing, rash, edema Neuro: alert & oriented x 3, cranial nerves grossly intact. moves all 4 extremities w/o difficulty. Affect pleasant.  ECG: Normal sinus rhythm no acute changes  Results for orders placed or performed during the hospital encounter of 11/20/23 (from the past 24 hours)  CBG monitoring, ED     Status: Abnormal   Collection Time: 11/20/23  1:05 PM  Result Value Ref Range   Glucose-Capillary 116 (H) 70 - 99 mg/dL  Protime-INR     Status: None   Collection Time: 11/20/23  1:05 PM  Result Value Ref Range   Prothrombin Time 13.4 11.4 - 15.2 seconds   INR 1.0 0.8 - 1.2  APTT     Status: None   Collection Time: 11/20/23  1:05 PM  Result Value Ref Range   aPTT 27 24 - 36 seconds  CBC     Status: Abnormal   Collection Time: 11/20/23  1:05 PM  Result Value Ref Range   WBC 7.1 4.0 - 10.5 K/uL   RBC 5.46 4.22 - 5.81 MIL/uL   Hemoglobin 15.4 13.0 - 17.0 g/dL   HCT 16.1 09.6 - 04.5 %   MCV 86.3 80.0 - 100.0 fL   MCH 28.2 26.0 - 34.0 pg   MCHC 32.7 30.0 - 36.0 g/dL   RDW 40.9 (H) 81.1 - 91.4 %   Platelets 226 150 - 400 K/uL   nRBC 0.0 0.0 - 0.2 %  Differential     Status: None   Collection Time: 11/20/23  1:05 PM  Result Value Ref Range    Neutrophils Relative % 65 %   Neutro Abs 4.6 1.7 - 7.7 K/uL   Lymphocytes Relative 19 %   Lymphs Abs 1.3 0.7 - 4.0 K/uL   Monocytes Relative 10 %   Monocytes Absolute 0.7 0.1 -  1.0 K/uL   Eosinophils Relative 5 %   Eosinophils Absolute 0.4 0.0 - 0.5 K/uL   Basophils Relative 1 %   Basophils Absolute 0.0 0.0 - 0.1 K/uL   Immature Granulocytes 0 %   Abs Immature Granulocytes 0.02 0.00 - 0.07 K/uL  Comprehensive metabolic panel     Status: Abnormal   Collection Time: 11/20/23  1:05 PM  Result Value Ref Range   Sodium 135 135 - 145 mmol/L   Potassium 4.3 3.5 - 5.1 mmol/L   Chloride 103 98 - 111 mmol/L   CO2 22 22 - 32 mmol/L   Glucose, Bld 126 (H) 70 - 99 mg/dL   BUN 34 (H) 8 - 23 mg/dL   Creatinine, Ser 4.09 (H) 0.61 - 1.24 mg/dL   Calcium 9.6 8.9 - 81.1 mg/dL   Total Protein 7.8 6.5 - 8.1 g/dL   Albumin 3.8 3.5 - 5.0 g/dL   AST 17 15 - 41 U/L   ALT 11 0 - 44 U/L   Alkaline Phosphatase 62 38 - 126 U/L   Total Bilirubin 0.9 0.0 - 1.2 mg/dL   GFR, Estimated 19 (L) >60 mL/min   Anion gap 10 5 - 15  Ethanol     Status: None   Collection Time: 11/20/23  1:05 PM  Result Value Ref Range   Alcohol, Ethyl (B) <10 <10 mg/dL  CK     Status: None   Collection Time: 11/20/23  1:05 PM  Result Value Ref Range   Total CK 57 49 - 397 U/L  Troponin I (High Sensitivity)     Status: Abnormal   Collection Time: 11/20/23  1:05 PM  Result Value Ref Range   Troponin I (High Sensitivity) 58 (H) <18 ng/L  Urinalysis, Complete w Microscopic -Urine, Clean Catch     Status: Abnormal   Collection Time: 11/20/23 10:57 PM  Result Value Ref Range   Color, Urine YELLOW (A) YELLOW   APPearance HAZY (A) CLEAR   Specific Gravity, Urine >1.046 (H) 1.005 - 1.030   pH 5.0 5.0 - 8.0   Glucose, UA NEGATIVE NEGATIVE mg/dL   Hgb urine dipstick NEGATIVE NEGATIVE   Bilirubin Urine NEGATIVE NEGATIVE   Ketones, ur NEGATIVE NEGATIVE mg/dL   Protein, ur 30 (A) NEGATIVE mg/dL   Nitrite NEGATIVE NEGATIVE    Leukocytes,Ua NEGATIVE NEGATIVE   RBC / HPF 0-5 0 - 5 RBC/hpf   WBC, UA 0-5 0 - 5 WBC/hpf   Bacteria, UA NONE SEEN NONE SEEN   Squamous Epithelial / HPF 0-5 0 - 5 /HPF   Mucus PRESENT   Basic metabolic panel     Status: Abnormal   Collection Time: 11/21/23  2:22 AM  Result Value Ref Range   Sodium 136 135 - 145 mmol/L   Potassium 3.8 3.5 - 5.1 mmol/L   Chloride 107 98 - 111 mmol/L   CO2 20 (L) 22 - 32 mmol/L   Glucose, Bld 101 (H) 70 - 99 mg/dL   BUN 38 (H) 8 - 23 mg/dL   Creatinine, Ser 9.14 (H) 0.61 - 1.24 mg/dL   Calcium 8.5 (L) 8.9 - 10.3 mg/dL   GFR, Estimated 26 (L) >60 mL/min   Anion gap 9 5 - 15  Lipid panel     Status: Abnormal   Collection Time: 11/21/23  2:22 AM  Result Value Ref Range   Cholesterol 100 0 - 200 mg/dL   Triglycerides 782 <956 mg/dL   HDL 25 (L) >21 mg/dL  Total CHOL/HDL Ratio 4.0 RATIO   VLDL 24 0 - 40 mg/dL   LDL Cholesterol 51 0 - 99 mg/dL   MR BRAIN WO CONTRAST Result Date: 11/20/2023 CLINICAL DATA:  Initial evaluation for acute neuro deficit, stroke suspected. EXAM: MRI HEAD WITHOUT CONTRAST TECHNIQUE: Multiplanar, multiecho pulse sequences of the brain and surrounding structures were obtained without intravenous contrast. COMPARISON:  Comparison made with CTs from earlier the same day as well as previous MRI from 01/15/2020 FINDINGS: Brain: Mild age-related cerebral atrophy. Patchy and confluent T2/FLAIR hyperintensity involving the periventricular and deep white matter both cerebral hemispheres as well as the pons, most characteristic of chronic microvascular ischemic disease, moderate to advanced in nature. Few small associated lacunar infarcts about the bilateral basal ganglia and thalami. Postoperative changes from prior left retrosigmoid craniotomy with underlying chronic encephalomalacia within the underlying left cerebral hemisphere. Probable sequelae of remote right frontal ventriculostomy noted. Scattered patchy subcentimeter foci of restricted  diffusion seen involving the posterior left frontoparietal and temporal lobes, consistent with small acute ischemic infarcts (series 9, images 41-23). Largest area of infarction measures 6 mm at the left temporal lobe (series 9, image 23). Minimal involvement of the left thalamus (series 9, image 26). No associated hemorrhage or mass effect. An additional subcentimeter acute ischemic nonhemorrhagic infarct present at the anterior right basal ganglia (series 9, image 23). Additional possible punctate acute ischemic nonhemorrhagic infarct noted at the superior left cerebellum (series 9, image 14). A central thromboembolic etiology is suspected given the various vascular distributions involved. Gray-white matter differentiation otherwise maintained. No acute intracranial hemorrhage. Multiple scattered chronic micro hemorrhages noted, most pronounced about the cerebellum and thalami, likely related to chronic poorly controlled hypertension. Chronic hemosiderin staining noted about the chronic left cerebellar encephalomalacia. 1 cm focus of susceptibility artifact present at the mesial left temporal lobe (series 3, image 19). Underlying mild T2 hyperintensity with T2 hypointense rim. Focal hyperdensity at this location on prior CT. Finding suspected to reflect a small cavernoma. No other mass lesion, mass effect or midline shift. Mild ventricular prominence related to global parenchymal volume loss without hydrocephalus. No extra-axial fluid collection. Partially empty sella noted. Vascular: Major intracranial vascular flow voids are well maintained. Skull and upper cervical spine: Craniocervical junction within normal limits. Bone marrow signal intensity normal. No acute scalp soft tissue abnormality. Post craniotomy changes on the left. Sinuses/Orbits: Globes and orbital soft tissues within normal limits. Mucosal thickening with small air-fluid level present within the left maxillary sinus. Paranasal sinuses are  otherwise largely clear. Small right mastoid effusion noted, of doubtful significance. Image nasopharynx unremarkable. Other: None. IMPRESSION: 1. Scattered subcentimeter acute ischemic nonhemorrhagic infarcts involving the posterior left frontoparietal and temporal lobes, with additional small acute ischemic nonhemorrhagic infarct at the anterior right basal ganglia. Additional possible punctate acute ischemic nonhemorrhagic infarct at the superior left cerebellum. A central thromboembolic etiology is suspected given the various vascular distributions involved. 2. Underlying age-related cerebral atrophy with moderate to advanced chronic microvascular ischemic disease. 3. 1 cm focus of susceptibility artifact at the mesial left temporal lobe, suspected to reflect a small cavernoma. 4. Postoperative changes from prior left retrosigmoid craniotomy with underlying chronic encephalomalacia within the left cerebellar hemisphere. Electronically Signed   By: Rise Mu M.D.   On: 11/20/2023 22:06   DG Lumbar Spine 2-3 Views Result Date: 11/20/2023 CLINICAL DATA:  Back pain. Chronic kidney disease. Fall 2 days ago. EXAM: LUMBAR SPINE - 2-3 VIEW COMPARISON:  06/23/2013.  CT of the abdomen and pelvis  of 10/02/2023 FINDINGS: Five lumbar type vertebral bodies. Contrast within normal caliber collecting systems from today's CTA head. Normal caliber contrast filled bladder. Maintenance of vertebral body height and alignment. Loss of intervertebral disc height at L4-5 and less fight less so L5-S1. Facets are well-aligned. Aortic atherosclerosis. IMPRESSION: Lumbar spondylosis, without acute osseous abnormality. Aortic Atherosclerosis (ICD10-I70.0). Electronically Signed   By: Jeronimo Greaves M.D.   On: 11/20/2023 16:30   US RENAL Result Date: 11/20/2023 CLINICAL DATA:  Acute kidney injury EXAM: RENAL / URINARY TRACT ULTRASOUND COMPLETE COMPARISON:  CT abdomen 10/02/2023 FINDINGS: Right Kidney: Renal measurements: 12.4  by 5.4 by 5.3 cm = volume: 189 mL. Echogenicity within normal limits. No mass or hydronephrosis visualized. 1.0 cm upper pole benign cyst noted. Left Kidney: Renal measurements: 12.4 by 5.5 by 5.8 cm = volume: 206 mL. Echogenicity within normal limits. No mass or hydronephrosis visualized. 1.3 cm upper pole benign cyst noted. Bladder: Debris observed in the urinary bladder. Bilateral ureteral jets are visible. Other: None. IMPRESSION: 1. No hydronephrosis. 2. Debris in the urinary bladder. Correlate with urinalysis in assessing for urinary tract infection. Electronically Signed   By: Gaylyn Rong M.D.   On: 11/20/2023 16:10   CT ANGIO HEAD NECK W WO CM W PERF (CODE STROKE) Result Date: 11/20/2023 CLINICAL DATA:  Leg weakness.  Code stroke. EXAM: CT ANGIOGRAPHY HEAD AND NECK CT PERFUSION BRAIN TECHNIQUE: Multidetector CT imaging of the head and neck was performed using the standard protocol during bolus administration of intravenous contrast. Multiplanar CT image reconstructions and MIPs were obtained to evaluate the vascular anatomy. Carotid stenosis measurements (when applicable) are obtained utilizing NASCET criteria, using the distal internal carotid diameter as the denominator. Multiphase CT imaging of the brain was performed following IV bolus contrast injection. Subsequent parametric perfusion maps were calculated using RAPID software. RADIATION DOSE REDUCTION: This exam was performed according to the departmental dose-optimization program which includes automated exposure control, adjustment of the mA and/or kV according to patient size and/or use of iterative reconstruction technique. CONTRAST:  OMNIPAQUE IOHEXOL 350 MG/ML SOLN COMPARISON:  CT head without contrast 11/20/2023. CT angio head 01/15/2020. FINDINGS: CTA NECK FINDINGS The timing of the contrast bolus is suboptimal. Contrast is densest within the right atrium. Aortic arch: Atherosclerotic calcifications are present at the aortic  arch and great vessel origins. No significant stenosis is present. No aneurysm or dissection is present. Right carotid system: Mild irregular wall thickening is present throughout the right common carotid artery. No focal stenosis is present. Atherosclerotic changes are present at the aortic arch. Proximal right ICA measures 2 mm. This compares with a more distal measurement of 5 mm. Left carotid system: The left common carotid artery is within normal limits. Atherosclerotic calcifications are present at the aortic arch the proximal right ICA scratched at the proximal left ICA is narrowed to 2.7 mm. No significant stenosis of greater than 50% is present. Cervical left ICA is otherwise normal. Vertebral arteries: The vertebral arteries are codominant. Both vertebral arteries originate from the subclavian arteries. Decreased scratched a decreased contrast density is present throughout the right vertebral artery without a focal stenosis. No significant stenosis is present on the left. Skeleton: The vertebral body heights are normal. Other neck: The soft tissues of the neck are otherwise unremarkable. Salivary glands are within normal limits. Thyroid is normal. No significant adenopathy is present. No focal mucosal or submucosal lesions are present. Upper chest: Lung apices are clear bilaterally. Thoracic inlet is within normal limits. Review  of the MIP images confirms the above findings CTA HEAD FINDINGS Anterior circulation: Atherosclerotic calcifications are present within the cavernous internal carotid arteries bilaterally. No significant stenosis is present through the ICA termini. The A1 and M1 segments are normal. The anterior communicating artery is patent. Marked attenuation of ACA and MCA branch vessels bilaterally is likely secondary to poor cardiac output. No focal stenosis or branch vessel occlusion is present. Posterior circulation: Moderate stenosis is present the distal right V4 segment. This may account  for decreased density in the more proximal right vertebral artery. Calcifications are present in the left V4 segment without focal stenosis. The vertebrobasilar junction and basilar artery normal. Both posterior cerebral arteries originate from basilar tip. Moderate 10 UA shin of PCA branch vessels is noted bilaterally, again likely secondary to poor cardiac output. Venous sinuses: The dural sinuses are patent. The straight sinus and deep cerebral veins are intact. Cortical veins are within normal limits. No significant vascular malformation is evident. Anatomic variants: None Review of the MIP images confirms the above findings CT Brain Perfusion Findings: ASPECTS: 10/10 The CT perfusion maps suggest ischemia predominantly within the cerebellum. This is likely artifactual. Core infarct is mapped to a remote infarct. Moderate patient motion is present. IMPRESSION: 1. Suboptimal timing of the contrast bolus limits evaluation of branch vessels. 2. Atherosclerotic changes at the cavernous internal carotid arteries bilaterally without significant stenosis. 3. Moderate stenosis of the distal right V4 segment. This may account for decreased density in the more proximal right vertebral artery. 4. CT perfusion maps suggest ischemia predominantly within the cerebellum. Significant patient motion is present on the CT perfusion images. The results are likely artifactual. 5.  Aortic Atherosclerosis (ICD10-I70.0). These results were called by telephone at the time of interpretation on 11/20/2023 at 1:51 pm to provider Dr. Amada Jupiter, who verbally acknowledged these results. Electronically Signed   By: Marin Roberts M.D.   On: 11/20/2023 13:51   CT HEAD CODE STROKE WO CONTRAST Result Date: 11/20/2023 CLINICAL DATA:  Code stroke. Slurred speech. Right-sided facial droop. EXAM: CT HEAD WITHOUT CONTRAST TECHNIQUE: Contiguous axial images were obtained from the base of the skull through the vertex without intravenous  contrast. RADIATION DOSE REDUCTION: This exam was performed according to the departmental dose-optimization program which includes automated exposure control, adjustment of the mA and/or kV according to patient size and/or use of iterative reconstruction technique. COMPARISON:  CT head without contrast 09/02/2022. FINDINGS: Brain: Chronic left cerebellar encephalomalacia is stable. A remote cortical infarct in the anterior right frontal lobe is stable. Mild atrophy and moderate diffuse white matter disease is similar the prior study. No acute infarct, hemorrhage, or mass lesion is present. Deep brain nuclei are within normal limits. The ventricles are of normal size. No significant extraaxial fluid collection is present. Mild ex vacuo dilation of the right lateral ventricle is stable. No significant extraaxial fluid collection is present. The brainstem and right cerebellum are within normal limits. A mildly enlarged empty sella is stable. Midline structures are otherwise within normal limits. Vascular: Atherosclerotic calcifications are present within the cavernous internal carotid arteries and at the dural margin of both vertebral arteries. No hyperdense vessel is present. Skull: Calvarium is otherwise intact. The small lipoma is present in the left temporal scalp stable. Scalp is otherwise unremarkable. Sinuses/Orbits: Mild mucosal thickening is present along the inferior left maxillary sinus. The paranasal sinuses and mastoid air cells are otherwise clear. The globes and orbits are within normal limits. ASPECTS [facility] Stroke Program Early CT  Score) - Ganglionic level infarction (caudate, lentiform nuclei, internal capsule, insula, M1-M3 cortex): 7/7 - Supraganglionic infarction (M4-M6 cortex): 3/3 Total score (0-10 with 10 being normal): 10/10 IMPRESSION: 1. No acute intracranial abnormality or significant interval change. 2. Stable chronic left cerebellar encephalomalacia. 3. Stable remote cortical infarct of  the anterior right frontal lobe. 4. Stable mild atrophy and moderate diffuse white matter disease. This likely reflects the sequela of chronic microvascular ischemia. 5. Aspects is 10/10. The above was relayed via text pager to Dr. Amada Jupiter on 11/20/2023 at 13:21 . Electronically Signed   By: Marin Roberts M.D.   On: 11/20/2023 13:22     ASSESSMENT AND PLAN: Possible embolic stroke with CT of the head showing cerebellar and remote cortical infarct in the anterior right frontal lobe.  Plan is to do transesophageal echocardiogram tomorrow morning at 8 AM.  Adrian Blackwater

## 2023-11-21 NOTE — ED Notes (Signed)
 This RN replaced patients leads at this time and emptied patients urinal.

## 2023-11-21 NOTE — Progress Notes (Signed)
 MRI shows evidence of central embolic stroke. TTE last month showed no intracardiac clot. TEE requested for tomorrow for further evaluation of possible central embolic source. Patient cleared from neurology standpoint for procedure.  Bing Neighbors MD

## 2023-11-21 NOTE — Progress Notes (Signed)
 Progress Note   Patient: Howard Weaver EAV:[id number] DOB: 13-Jan-1956 DOA: 11/20/2023     0 DOS: the patient was seen and examined on 11/21/2023   Brief hospital course: [name] is a 68 y.o. male with medical history significant of HTN, HLD, CKD stage IIIa, CAD, anxiety/depression, presented with worsening of bilateral lower extremity weakness and pain since 1 week.  Initial CT head negative for acute findings, MRI brain revealed scattered subcentimeter acute ischemic nonhemorrhagic infarcts involving the posterior left frontoparietal and temporal lobes, with additional small acute ischemic nonhemorrhagic infarct at the anterior right basal ganglia. Additional possible punctate acute ischemic nonhemorrhagic infarct at the superior left cerebellum. A central thromboembolic etiology is suspected given the various vascular distributions involved.  Neurology and cardiology consulted.  Assessment and Plan: Acute ischemic stroke: Multiple infarcts involving left frontoparietal and temporal lobes, right basal ganglia and also punctate infarcts at superior left cerebellum. Central thromboembolic etiology suspected. Neurology evaluation appreciated, complete stroke workup including TEE advised.  Patient will be continued on aspirin, Plavix and statin therapy Cardiology plan to do TEE tomorrow morning. A1c 5.7, LDL 51. PT OT SLP evaluation. Out of bed to chair, incentive spirometry.  Acute on CKD stage IIIa- In the setting of hypotension, poor oral intake Patient will be continued on IV hydration. Encourage oral diet, supplements. Avoid nephrotoxic drugs. Monitor daily renal function.  Hypotension: Improved with IV hydration. Low suspicion for infectious etiology. BP elevated today, allow permissive hypertension. Continue to hold antihypertensive regimen at this time.  Anxiety and depression Continue Lexapro therapy. Klonopin as needed at bedtime.      Out of bed to  chair. Incentive spirometry. Nursing supportive care. Fall, aspiration precautions. Diet:  Diet Orders (From admission, onward)     Start     Ordered   11/20/23 1442  Diet Heart Room service appropriate? Yes; Fluid consistency: Thin  Diet effective now       Question Answer Comment  Room service appropriate? Yes   Fluid consistency: Thin      11/20/23 1442           DVT prophylaxis: heparin injection 5,000 Units Start: 11/20/23 1445  Level of care: Tele medical   Code Status: Full Code  Subjective: Patient is seen and examined today morning.  Patient walked with physical therapy.  Complains of bilateral leg weakness.  Eating fair.  Physical Exam: Vitals:   11/21/23 1018 11/21/23 1100 11/21/23 1215 11/21/23 1303  BP: (!) 138/97 119/88    Pulse: 64 65 65   Resp: (!) 23 (!) 25 (!) 22   Temp:    98.4 F (36.9 C)  TempSrc:    Oral  SpO2: 94% 99% 98%   Weight:      Height:        General - Elderly Caucasian male, no apparent distress HEENT - PERRLA, EOMI, atraumatic head, non tender sinuses. Lung - Clear, basal rales, no rhonchi, wheezes. Heart - S1, S2 heard, no murmurs, rubs, trace pedal edema. Abdomen - Soft, non tender, bowel sounds good Neuro - Alert, awake and oriented bilateral lower extremities weak. Skin - Warm and dry.  Data Reviewed:      Latest Ref Rng & Units 11/20/2023    1:05 PM 10/05/2023    6:25 AM 10/03/2023    3:57 AM  CBC  WBC 4.0 - 10.5 K/uL 7.1  3.2  4.4   Hemoglobin 13.0 - 17.0 g/dL 78.2  95.6  21.3   Hematocrit 39.0 -  52.0 % 47.1  41.0  45.2   Platelets 150 - 400 K/uL 226  157  159       Latest Ref Rng & Units 11/21/2023    2:22 AM 11/20/2023    1:05 PM 10/05/2023    6:25 AM  BMP  Glucose 70 - 99 mg/dL 086  578  469   BUN 8 - 23 mg/dL 38  34  34   Creatinine 0.61 - 1.24 mg/dL 6.29  5.28  4.13   Sodium 135 - 145 mmol/L 136  135  135   Potassium 3.5 - 5.1 mmol/L 3.8  4.3  3.9   Chloride 98 - 111 mmol/L 107  103  102   CO2 22 - 32 mmol/L  20  22  26    Calcium 8.9 - 10.3 mg/dL 8.5  9.6  9.0    MR BRAIN WO CONTRAST Result Date: 11/20/2023 CLINICAL DATA:  Initial evaluation for acute neuro deficit, stroke suspected. EXAM: MRI HEAD WITHOUT CONTRAST TECHNIQUE: Multiplanar, multiecho pulse sequences of the brain and surrounding structures were obtained without intravenous contrast. COMPARISON:  Comparison made with CTs from earlier the same day as well as previous MRI from 01/15/2020 FINDINGS: Brain: Mild age-related cerebral atrophy. Patchy and confluent T2/FLAIR hyperintensity involving the periventricular and deep white matter both cerebral hemispheres as well as the pons, most characteristic of chronic microvascular ischemic disease, moderate to advanced in nature. Few small associated lacunar infarcts about the bilateral basal ganglia and thalami. Postoperative changes from prior left retrosigmoid craniotomy with underlying chronic encephalomalacia within the underlying left cerebral hemisphere. Probable sequelae of remote right frontal ventriculostomy noted. Scattered patchy subcentimeter foci of restricted diffusion seen involving the posterior left frontoparietal and temporal lobes, consistent with small acute ischemic infarcts (series 9, images 41-23). Largest area of infarction measures 6 mm at the left temporal lobe (series 9, image 23). Minimal involvement of the left thalamus (series 9, image 26). No associated hemorrhage or mass effect. An additional subcentimeter acute ischemic nonhemorrhagic infarct present at the anterior right basal ganglia (series 9, image 23). Additional possible punctate acute ischemic nonhemorrhagic infarct noted at the superior left cerebellum (series 9, image 14). A central thromboembolic etiology is suspected given the various vascular distributions involved. Gray-white matter differentiation otherwise maintained. No acute intracranial hemorrhage. Multiple scattered chronic micro hemorrhages noted, most  pronounced about the cerebellum and thalami, likely related to chronic poorly controlled hypertension. Chronic hemosiderin staining noted about the chronic left cerebellar encephalomalacia. 1 cm focus of susceptibility artifact present at the mesial left temporal lobe (series 3, image 19). Underlying mild T2 hyperintensity with T2 hypointense rim. Focal hyperdensity at this location on prior CT. Finding suspected to reflect a small cavernoma. No other mass lesion, mass effect or midline shift. Mild ventricular prominence related to global parenchymal volume loss without hydrocephalus. No extra-axial fluid collection. Partially empty sella noted. Vascular: Major intracranial vascular flow voids are well maintained. Skull and upper cervical spine: Craniocervical junction within normal limits. Bone marrow signal intensity normal. No acute scalp soft tissue abnormality. Post craniotomy changes on the left. Sinuses/Orbits: Globes and orbital soft tissues within normal limits. Mucosal thickening with small air-fluid level present within the left maxillary sinus. Paranasal sinuses are otherwise largely clear. Small right mastoid effusion noted, of doubtful significance. Image nasopharynx unremarkable. Other: None. IMPRESSION: 1. Scattered subcentimeter acute ischemic nonhemorrhagic infarcts involving the posterior left frontoparietal and temporal lobes, with additional small acute ischemic nonhemorrhagic infarct at the anterior right basal ganglia. Additional  possible punctate acute ischemic nonhemorrhagic infarct at the superior left cerebellum. A central thromboembolic etiology is suspected given the various vascular distributions involved. 2. Underlying age-related cerebral atrophy with moderate to advanced chronic microvascular ischemic disease. 3. 1 cm focus of susceptibility artifact at the mesial left temporal lobe, suspected to reflect a small cavernoma. 4. Postoperative changes from prior left retrosigmoid  craniotomy with underlying chronic encephalomalacia within the left cerebellar hemisphere. Electronically Signed   By: Rise Mu M.D.   On: 11/20/2023 22:06   DG Lumbar Spine 2-3 Views Result Date: 11/20/2023 CLINICAL DATA:  Back pain. Chronic kidney disease. Fall 2 days ago. EXAM: LUMBAR SPINE - 2-3 VIEW COMPARISON:  06/23/2013.  CT of the abdomen and pelvis of 10/02/2023 FINDINGS: Five lumbar type vertebral bodies. Contrast within normal caliber collecting systems from today's CTA head. Normal caliber contrast filled bladder. Maintenance of vertebral body height and alignment. Loss of intervertebral disc height at L4-5 and less fight less so L5-S1. Facets are well-aligned. Aortic atherosclerosis. IMPRESSION: Lumbar spondylosis, without acute osseous abnormality. Aortic Atherosclerosis (ICD10-I70.0). Electronically Signed   By: Jeronimo Greaves M.D.   On: 11/20/2023 16:30   US RENAL Result Date: 11/20/2023 CLINICAL DATA:  Acute kidney injury EXAM: RENAL / URINARY TRACT ULTRASOUND COMPLETE COMPARISON:  CT abdomen 10/02/2023 FINDINGS: Right Kidney: Renal measurements: 12.4 by 5.4 by 5.3 cm = volume: 189 mL. Echogenicity within normal limits. No mass or hydronephrosis visualized. 1.0 cm upper pole benign cyst noted. Left Kidney: Renal measurements: 12.4 by 5.5 by 5.8 cm = volume: 206 mL. Echogenicity within normal limits. No mass or hydronephrosis visualized. 1.3 cm upper pole benign cyst noted. Bladder: Debris observed in the urinary bladder. Bilateral ureteral jets are visible. Other: None. IMPRESSION: 1. No hydronephrosis. 2. Debris in the urinary bladder. Correlate with urinalysis in assessing for urinary tract infection. Electronically Signed   By: Gaylyn Rong M.D.   On: 11/20/2023 16:10   CT ANGIO HEAD NECK W WO CM W PERF (CODE STROKE) Result Date: 11/20/2023 CLINICAL DATA:  Leg weakness.  Code stroke. EXAM: CT ANGIOGRAPHY HEAD AND NECK CT PERFUSION BRAIN TECHNIQUE: Multidetector CT  imaging of the head and neck was performed using the standard protocol during bolus administration of intravenous contrast. Multiplanar CT image reconstructions and MIPs were obtained to evaluate the vascular anatomy. Carotid stenosis measurements (when applicable) are obtained utilizing NASCET criteria, using the distal internal carotid diameter as the denominator. Multiphase CT imaging of the brain was performed following IV bolus contrast injection. Subsequent parametric perfusion maps were calculated using RAPID software. RADIATION DOSE REDUCTION: This exam was performed according to the departmental dose-optimization program which includes automated exposure control, adjustment of the mA and/or kV according to patient size and/or use of iterative reconstruction technique. CONTRAST:  OMNIPAQUE IOHEXOL 350 MG/ML SOLN COMPARISON:  CT head without contrast 11/20/2023. CT angio head 01/15/2020. FINDINGS: CTA NECK FINDINGS The timing of the contrast bolus is suboptimal. Contrast is densest within the right atrium. Aortic arch: Atherosclerotic calcifications are present at the aortic arch and great vessel origins. No significant stenosis is present. No aneurysm or dissection is present. Right carotid system: Mild irregular wall thickening is present throughout the right common carotid artery. No focal stenosis is present. Atherosclerotic changes are present at the aortic arch. Proximal right ICA measures 2 mm. This compares with a more distal measurement of 5 mm. Left carotid system: The left common carotid artery is within normal limits. Atherosclerotic calcifications are present at the aortic arch  the proximal right ICA scratched at the proximal left ICA is narrowed to 2.7 mm. No significant stenosis of greater than 50% is present. Cervical left ICA is otherwise normal. Vertebral arteries: The vertebral arteries are codominant. Both vertebral arteries originate from the subclavian arteries. Decreased scratched  a decreased contrast density is present throughout the right vertebral artery without a focal stenosis. No significant stenosis is present on the left. Skeleton: The vertebral body heights are normal. Other neck: The soft tissues of the neck are otherwise unremarkable. Salivary glands are within normal limits. Thyroid is normal. No significant adenopathy is present. No focal mucosal or submucosal lesions are present. Upper chest: Lung apices are clear bilaterally. Thoracic inlet is within normal limits. Review of the MIP images confirms the above findings CTA HEAD FINDINGS Anterior circulation: Atherosclerotic calcifications are present within the cavernous internal carotid arteries bilaterally. No significant stenosis is present through the ICA termini. The A1 and M1 segments are normal. The anterior communicating artery is patent. Marked attenuation of ACA and MCA branch vessels bilaterally is likely secondary to poor cardiac output. No focal stenosis or branch vessel occlusion is present. Posterior circulation: Moderate stenosis is present the distal right V4 segment. This may account for decreased density in the more proximal right vertebral artery. Calcifications are present in the left V4 segment without focal stenosis. The vertebrobasilar junction and basilar artery normal. Both posterior cerebral arteries originate from basilar tip. Moderate 10 UA shin of PCA branch vessels is noted bilaterally, again likely secondary to poor cardiac output. Venous sinuses: The dural sinuses are patent. The straight sinus and deep cerebral veins are intact. Cortical veins are within normal limits. No significant vascular malformation is evident. Anatomic variants: None Review of the MIP images confirms the above findings CT Brain Perfusion Findings: ASPECTS: 10/10 The CT perfusion maps suggest ischemia predominantly within the cerebellum. This is likely artifactual. Core infarct is mapped to a remote infarct. Moderate  patient motion is present. IMPRESSION: 1. Suboptimal timing of the contrast bolus limits evaluation of branch vessels. 2. Atherosclerotic changes at the cavernous internal carotid arteries bilaterally without significant stenosis. 3. Moderate stenosis of the distal right V4 segment. This may account for decreased density in the more proximal right vertebral artery. 4. CT perfusion maps suggest ischemia predominantly within the cerebellum. Significant patient motion is present on the CT perfusion images. The results are likely artifactual. 5.  Aortic Atherosclerosis (ICD10-I70.0). These results were called by telephone at the time of interpretation on 11/20/2023 at 1:51 pm to provider Dr. Amada Jupiter, who verbally acknowledged these results. Electronically Signed   By: Marin Roberts M.D.   On: 11/20/2023 13:51   CT HEAD CODE STROKE WO CONTRAST Result Date: 11/20/2023 CLINICAL DATA:  Code stroke. Slurred speech. Right-sided facial droop. EXAM: CT HEAD WITHOUT CONTRAST TECHNIQUE: Contiguous axial images were obtained from the base of the skull through the vertex without intravenous contrast. RADIATION DOSE REDUCTION: This exam was performed according to the departmental dose-optimization program which includes automated exposure control, adjustment of the mA and/or kV according to patient size and/or use of iterative reconstruction technique. COMPARISON:  CT head without contrast 09/02/2022. FINDINGS: Brain: Chronic left cerebellar encephalomalacia is stable. A remote cortical infarct in the anterior right frontal lobe is stable. Mild atrophy and moderate diffuse white matter disease is similar the prior study. No acute infarct, hemorrhage, or mass lesion is present. Deep brain nuclei are within normal limits. The ventricles are of normal size. No significant extraaxial fluid collection  is present. Mild ex vacuo dilation of the right lateral ventricle is stable. No significant extraaxial fluid collection is  present. The brainstem and right cerebellum are within normal limits. A mildly enlarged empty sella is stable. Midline structures are otherwise within normal limits. Vascular: Atherosclerotic calcifications are present within the cavernous internal carotid arteries and at the dural margin of both vertebral arteries. No hyperdense vessel is present. Skull: Calvarium is otherwise intact. The small lipoma is present in the left temporal scalp stable. Scalp is otherwise unremarkable. Sinuses/Orbits: Mild mucosal thickening is present along the inferior left maxillary sinus. The paranasal sinuses and mastoid air cells are otherwise clear. The globes and orbits are within normal limits. ASPECTS [facility] Stroke Program Early CT Score) - Ganglionic level infarction (caudate, lentiform nuclei, internal capsule, insula, M1-M3 cortex): 7/7 - Supraganglionic infarction (M4-M6 cortex): 3/3 Total score (0-10 with 10 being normal): 10/10 IMPRESSION: 1. No acute intracranial abnormality or significant interval change. 2. Stable chronic left cerebellar encephalomalacia. 3. Stable remote cortical infarct of the anterior right frontal lobe. 4. Stable mild atrophy and moderate diffuse white matter disease. This likely reflects the sequela of chronic microvascular ischemia. 5. Aspects is 10/10. The above was relayed via text pager to Dr. Amada Jupiter on 11/20/2023 at 13:21 . Electronically Signed   By: Marin Roberts M.D.   On: 11/20/2023 13:22    Family Communication: Discussed with patient, he understand and agree. All questions answered.  Disposition: Status is: Observation The patient will require care spanning > 2 midnights and should be moved to inpatient because: new stroke  Planned Discharge Destination: Home and Home with Home Health     Time spent: 39 minutes  Author: Marcelino Duster, MD 11/21/2023 1:37 PM Secure chat 7am to 7pm For on call review [email or web address].

## 2023-11-21 NOTE — Plan of Care (Signed)

## 2023-11-21 NOTE — Evaluation (Signed)
 Physical Therapy Evaluation Patient Details Name: Howard Weaver MRN: [id number] DOB: [birth year] Today's Date: 11/21/2023  History of Present Illness  Pt admitted for AKI with postive L frontoparietal and temporal lobe CVA. Pt with complaints of falls and B LE pain. History includes craniotomy, depression, HTN, and CAD.  Clinical Impression  Pt is a pleasant 68 year old male who was admitted for AKI with recent CVA. Pt performs transfers and ambulation with cga and RW. Pt demonstrates deficits with strength/mobility. Reports B LE numbness and difficulty with ADLs. Needs assist for donning jeans and using belt in standing. Per patient, has been typically using QC, however at this time needs RW for safety. Would benefit from skilled PT to address above deficits and promote optimal return to PLOF. Pt will continue to receive skilled PT services while admitted and will defer to TOC/care team for updates regarding disposition planning.       If plan is discharge home, recommend the following: A little help with walking and/or transfers;A little help with bathing/dressing/bathroom;Assist for transportation;Help with stairs or ramp for entrance   Can travel by private vehicle        Equipment Recommendations None recommended by PT  Recommendations for Other Services       Functional Status Assessment Patient has had a recent decline in their functional status and demonstrates the ability to make significant improvements in function in a reasonable and predictable amount of time.     Precautions / Restrictions Precautions Precautions: Fall Recall of Precautions/Restrictions: Intact Restrictions Weight Bearing Restrictions Per Provider Order: No      Mobility  Bed Mobility               General bed mobility comments: not performed, received seated at EOB    Transfers Overall transfer level: Needs assistance Equipment used: Rolling walker (2 wheels) Transfers: Sit to/from  Stand Sit to Stand: Contact guard assist           General transfer comment: safe technique. Braces against bed during standing. RW used    Ambulation/Gait Ambulation/Gait assistance: Contact guard assist Gait Distance (Feet): 80 Feet Assistive device: Rolling walker (2 wheels) Gait Pattern/deviations: Step-through pattern       General Gait Details: ambulated in hallway with shuffling gait. Cues required for sequencing and RW. Needs cues to keep close to [employer] Mobility     Tilt Bed    Modified Rankin (Stroke Patients Only)       Balance Overall balance assessment: Needs assistance, History of Falls Sitting-balance support: Feet supported, No upper extremity supported Sitting balance-Leahy Scale: Normal     Standing balance support: Bilateral upper extremity supported Standing balance-Leahy Scale: Fair                               Pertinent Vitals/Pain Pain Assessment Pain Assessment: No/denies pain    Home Living Family/patient expects to be discharged to:: Private residence Living Arrangements: Children Available Help at Discharge: Family;Available 24 hours/day Type of Home: House Home Access: Stairs to enter Entrance Stairs-Rails: Can reach both Entrance Stairs-Number of Steps: 4   Home Layout: One level Home Equipment: Cane - Programmer, applications (2 wheels)      Prior Function Prior Level of Function : Independent/Modified Independent             Mobility Comments: has been indep with  mobility, however has been having multiple falls with QC. ADLs Comments: Reports he is indep, does not drive     Extremity/Trunk Assessment   Upper Extremity Assessment Upper Extremity Assessment: Overall WFL for tasks assessed    Lower Extremity Assessment Lower Extremity Assessment: Generalized weakness (B LE grossly 4/5)       Communication   Communication Communication: No apparent difficulties     Cognition Arousal: Alert Behavior During Therapy: WFL for tasks assessed/performed   PT - Cognitive impairments: No apparent impairments                       PT - Cognition Comments: alert and oriented Following commands: Intact       Cueing Cueing Techniques: Verbal cues     General Comments      Exercises     Assessment/Plan    PT Assessment Patient needs continued PT services  PT Problem List Decreased strength;Decreased activity tolerance;Decreased balance;Decreased mobility;Decreased knowledge of use of DME       PT Treatment Interventions DME instruction;Gait training;Therapeutic exercise;Balance training    PT Goals (Current goals can be found in the Care Plan section)  Acute Rehab PT Goals Patient Stated Goal: to go home PT Goal Formulation: With patient Time For Goal Achievement: 12/05/23 Potential to Achieve Goals: Good    Frequency Min 2X/week     Co-evaluation               AM-PAC PT "6 Clicks" Mobility  Outcome Measure Help needed turning from your back to your side while in a flat bed without using bedrails?: None Help needed moving from lying on your back to sitting on the side of a flat bed without using bedrails?: A Little Help needed moving to and from a bed to a chair (including a wheelchair)?: A Little Help needed standing up from a chair using your arms (e.g., wheelchair or bedside chair)?: A Little Help needed to walk in hospital room?: A Little Help needed climbing 3-5 steps with a railing? : A Lot 6 Click Score: 18    End of Session Equipment Utilized During Treatment: Gait belt Activity Tolerance: Patient tolerated treatment well Patient left: in bed;with bed alarm set Nurse Communication: Mobility status PT Visit Diagnosis: Difficulty in walking, not elsewhere classified (R26.2);Unsteadiness on feet (R26.81);Muscle weakness (generalized) (M62.81)    Time: 6578-4696 PT Time Calculation (min) (ACUTE ONLY): 22  min   Charges:   PT Evaluation $PT Eval Low Complexity: 1 Low PT Treatments $Gait Training: 8-22 mins PT General Charges $$ ACUTE PT VISIT: 1 Visit         Elizabeth Palau, PT, DPT, GCS [phone]   Howard Weaver 11/21/2023, 1:08 PM

## 2023-11-21 NOTE — Care Management Obs Status (Signed)
 MEDICARE OBSERVATION STATUS NOTIFICATION   Patient Details  Name: Howard Weaver MRN: [id number] Date of Birth: [birth year]   Medicare Observation Status Notification Given:  Orland Dec, CMA 11/21/2023, 2:42 PM

## 2023-11-22 ENCOUNTER — Observation Stay (HOSPITAL_COMMUNITY)
Admit: 2023-11-22 | Discharge: 2023-11-22 | Disposition: A | Discharge status: Home / Self Care | Attending: Cardiovascular Disease | Admitting: Cardiovascular Disease

## 2023-11-22 ENCOUNTER — Observation Stay: Payer: Self-pay | Admitting: Anesthesiology

## 2023-11-22 ENCOUNTER — Encounter
Admission: EM | Disposition: A | Discharge status: Home Health | Payer: Self-pay | Source: Home / Self Care | Attending: Internal Medicine

## 2023-11-22 DIAGNOSIS — Z1152 Encounter for screening for COVID-19: Secondary | ICD-10-CM | POA: Diagnosis not present

## 2023-11-22 DIAGNOSIS — F411 Generalized anxiety disorder: Secondary | ICD-10-CM | POA: Diagnosis present

## 2023-11-22 DIAGNOSIS — J449 Chronic obstructive pulmonary disease, unspecified: Secondary | ICD-10-CM | POA: Diagnosis present

## 2023-11-22 DIAGNOSIS — R29706 NIHSS score 6: Secondary | ICD-10-CM | POA: Diagnosis present

## 2023-11-22 DIAGNOSIS — I9589 Other hypotension: Secondary | ICD-10-CM | POA: Diagnosis not present

## 2023-11-22 DIAGNOSIS — F32A Depression, unspecified: Secondary | ICD-10-CM | POA: Diagnosis present

## 2023-11-22 DIAGNOSIS — G8929 Other chronic pain: Secondary | ICD-10-CM | POA: Diagnosis present

## 2023-11-22 DIAGNOSIS — Z888 Allergy status to other drugs, medicaments and biological substances status: Secondary | ICD-10-CM | POA: Diagnosis not present

## 2023-11-22 DIAGNOSIS — Z87891 Personal history of nicotine dependence: Secondary | ICD-10-CM | POA: Diagnosis not present

## 2023-11-22 DIAGNOSIS — Z880 Allergy status to penicillin: Secondary | ICD-10-CM | POA: Diagnosis not present

## 2023-11-22 DIAGNOSIS — F419 Anxiety disorder, unspecified: Secondary | ICD-10-CM | POA: Diagnosis present

## 2023-11-22 DIAGNOSIS — I639 Cerebral infarction, unspecified: Secondary | ICD-10-CM | POA: Diagnosis not present

## 2023-11-22 DIAGNOSIS — I129 Hypertensive chronic kidney disease with stage 1 through stage 4 chronic kidney disease, or unspecified chronic kidney disease: Secondary | ICD-10-CM | POA: Diagnosis present

## 2023-11-22 DIAGNOSIS — I160 Hypertensive urgency: Secondary | ICD-10-CM | POA: Diagnosis present

## 2023-11-22 DIAGNOSIS — E785 Hyperlipidemia, unspecified: Secondary | ICD-10-CM | POA: Diagnosis present

## 2023-11-22 DIAGNOSIS — Z86718 Personal history of other venous thrombosis and embolism: Secondary | ICD-10-CM | POA: Diagnosis not present

## 2023-11-22 DIAGNOSIS — I6389 Other cerebral infarction: Secondary | ICD-10-CM | POA: Diagnosis present

## 2023-11-22 DIAGNOSIS — I10 Essential (primary) hypertension: Secondary | ICD-10-CM | POA: Diagnosis not present

## 2023-11-22 DIAGNOSIS — I252 Old myocardial infarction: Secondary | ICD-10-CM | POA: Diagnosis not present

## 2023-11-22 DIAGNOSIS — N1831 Chronic kidney disease, stage 3a: Secondary | ICD-10-CM | POA: Diagnosis present

## 2023-11-22 DIAGNOSIS — I082 Rheumatic disorders of both aortic and tricuspid valves: Secondary | ICD-10-CM | POA: Diagnosis present

## 2023-11-22 DIAGNOSIS — N183 Chronic kidney disease, stage 3 unspecified: Secondary | ICD-10-CM | POA: Diagnosis not present

## 2023-11-22 DIAGNOSIS — G8194 Hemiplegia, unspecified affecting left nondominant side: Secondary | ICD-10-CM | POA: Diagnosis present

## 2023-11-22 DIAGNOSIS — I2510 Atherosclerotic heart disease of native coronary artery without angina pectoris: Secondary | ICD-10-CM | POA: Diagnosis present

## 2023-11-22 DIAGNOSIS — E782 Mixed hyperlipidemia: Secondary | ICD-10-CM | POA: Diagnosis not present

## 2023-11-22 DIAGNOSIS — Z79899 Other long term (current) drug therapy: Secondary | ICD-10-CM | POA: Diagnosis not present

## 2023-11-22 DIAGNOSIS — N179 Acute kidney failure, unspecified: Secondary | ICD-10-CM | POA: Diagnosis present

## 2023-11-22 DIAGNOSIS — I959 Hypotension, unspecified: Secondary | ICD-10-CM | POA: Diagnosis present

## 2023-11-22 DIAGNOSIS — Z8249 Family history of ischemic heart disease and other diseases of the circulatory system: Secondary | ICD-10-CM | POA: Diagnosis not present

## 2023-11-22 DIAGNOSIS — R531 Weakness: Secondary | ICD-10-CM | POA: Diagnosis present

## 2023-11-22 HISTORY — PX: TEE WITHOUT CARDIOVERSION: SHX5443

## 2023-11-22 LAB — BASIC METABOLIC PANEL
Anion gap: 10 (ref 5–15)
BUN: 24 mg/dL — ABNORMAL HIGH (ref 8–23)
CO2: 22 mmol/L (ref 22–32)
Calcium: 10.1 mg/dL (ref 8.9–10.3)
Chloride: 105 mmol/L (ref 98–111)
Creatinine, Ser: 1.37 mg/dL — ABNORMAL HIGH (ref 0.61–1.24)
GFR, Estimated: 56 mL/min — ABNORMAL LOW (ref >60)
Glucose, Bld: 119 mg/dL — ABNORMAL HIGH (ref 70–99)
Potassium: 4 mmol/L (ref 3.5–5.1)
Sodium: 137 mmol/L (ref 135–145)

## 2023-11-22 LAB — ECHO TEE

## 2023-11-22 SURGERY — ECHOCARDIOGRAM, TRANSESOPHAGEAL
Anesthesia: General | Laterality: Right

## 2023-11-22 MED ORDER — METOPROLOL SUCCINATE ER 50 MG PO TB24
100.0000 mg | ORAL_TABLET | Freq: Every day | ORAL | Status: DC
Start: 2023-11-22 — End: 2023-11-23
  Administered 2023-11-22 – 2023-11-23 (×2): 100 mg via ORAL
  Filled 2023-11-22 (×2): qty 2

## 2023-11-22 MED ORDER — HYDRALAZINE HCL 50 MG PO TABS
25.0000 mg | ORAL_TABLET | Freq: Three times a day (TID) | ORAL | Status: DC
  Administered 2023-11-22 – 2023-11-23 (×3): 25 mg via ORAL
  Filled 2023-11-22 (×3): qty 1

## 2023-11-22 MED ORDER — CLONIDINE HCL 0.1 MG PO TABS
0.2000 mg | ORAL_TABLET | Freq: Two times a day (BID) | ORAL | Status: DC
  Administered 2023-11-22 – 2023-11-23 (×3): 0.2 mg via ORAL
  Filled 2023-11-22 (×3): qty 2

## 2023-11-22 MED ORDER — CLONIDINE HCL 0.1 MG PO TABS: 0.3000 mg | ORAL_TABLET | Freq: Every day | ORAL | Status: DC

## 2023-11-22 MED ORDER — ENALAPRIL MALEATE 20 MG PO TABS
40.0000 mg | ORAL_TABLET | Freq: Every day | ORAL | Status: DC
  Filled 2023-11-22: qty 2

## 2023-11-22 MED ORDER — LIDOCAINE VISCOUS HCL 2 % MT SOLN
15.0000 mL | Freq: Once | OROMUCOSAL | Status: AC
  Administered 2023-11-22: 15 mL via OROMUCOSAL
  Filled 2023-11-22: qty 15

## 2023-11-22 MED ORDER — BUTAMBEN-TETRACAINE-BENZOCAINE 2-2-14 % EX AERO
1.0000 | INHALATION_SPRAY | Freq: Once | CUTANEOUS | Status: AC
  Administered 2023-11-22: 1 via TOPICAL
  Filled 2023-11-22: qty 20

## 2023-11-22 MED ORDER — PROPOFOL 10 MG/ML IV BOLUS
INTRAVENOUS | Status: DC | PRN
  Administered 2023-11-22: 30 mg via INTRAVENOUS
  Administered 2023-11-22: 80 mg via INTRAVENOUS
  Administered 2023-11-22: 30 mg via INTRAVENOUS

## 2023-11-22 MED ORDER — BUTAMBEN-TETRACAINE-BENZOCAINE 2-2-14 % EX AERO
INHALATION_SPRAY | CUTANEOUS | Status: AC
Start: 2023-11-22 — End: ?
  Filled 2023-11-22: qty 5

## 2023-11-22 MED ORDER — LIDOCAINE VISCOUS HCL 2 % MT SOLN
OROMUCOSAL | Status: AC
Start: 2023-11-22 — End: ?
  Filled 2023-11-22: qty 15

## 2023-11-22 MED ORDER — ENALAPRIL MALEATE 10 MG PO TABS
40.0000 mg | ORAL_TABLET | Freq: Every day | ORAL | Status: DC
  Administered 2023-11-22 – 2023-11-23 (×2): 40 mg via ORAL
  Filled 2023-11-22 (×2): qty 4

## 2023-11-22 MED ORDER — LIDOCAINE HCL (CARDIAC) PF 100 MG/5ML IV SOSY
PREFILLED_SYRINGE | INTRAVENOUS | Status: DC | PRN
  Administered 2023-11-22: 100 mg via INTRAVENOUS

## 2023-11-22 MED ORDER — ISOSORBIDE MONONITRATE ER 30 MG PO TB24
30.0000 mg | ORAL_TABLET | Freq: Every day | ORAL | Status: DC
  Administered 2023-11-22 – 2023-11-23 (×2): 30 mg via ORAL
  Filled 2023-11-22 (×2): qty 1

## 2023-11-22 NOTE — Progress Notes (Addendum)
   11/22/23 1151  Vitals  BP (!) 193/119  MEWS COLOR  MEWS Score Color Green  MEWS Score  MEWS Temp 0  MEWS Systolic 0  MEWS Pulse 0  MEWS RR 1  MEWS LOC 0  MEWS Score 1   PRN hydralazine IV given per order parameters and scheduled metoprolol (see MAR). Dr. Clide Dales notified of continuing uncontrolled HTN. New orders for scheduled PO hydralazine placed by Dr. Clide Dales.

## 2023-11-22 NOTE — Progress Notes (Signed)
 Progress Note   Patient: Howard Weaver ZOX:[id number] DOB: 1955-12-02 DOA: 11/20/2023     0 DOS: the patient was seen and examined on 11/22/2023   Brief hospital course: Howard Weaver is a 68 y.o. male with medical history significant of HTN, HLD, CKD stage IIIa, CAD, anxiety/depression, presented with worsening of bilateral lower extremity weakness and pain since 1 week.  Initial CT head negative for acute findings, MRI brain revealed scattered subcentimeter acute ischemic nonhemorrhagic infarcts involving the posterior left frontoparietal and temporal lobes, with additional small acute ischemic nonhemorrhagic infarct at the anterior right basal ganglia. Additional possible punctate acute ischemic nonhemorrhagic infarct at the superior left cerebellum. A central thromboembolic etiology is suspected given the various vascular distributions involved.  Neurology and cardiology consulted.  Assessment and Plan: Acute ischemic stroke: Multiple infarcts involving left frontoparietal and temporal lobes, right basal ganglia and also punctate infarcts at superior left cerebellum. Central thromboembolic etiology suspected. Neurology  follow up appreciated, advised aspirin+plavix for 3 weeks followed by Plavix. TEE study was unremarkable, for embolic stroke. Patient can go to Dr Welton Flakes office for holter monitor on Thursday 9AM.   A1c 5.7, LDL 51. PT OT eval for dc plan  Acute on CKD stage IIIa- In the setting of hypotension, poor oral intake His kidney function improved and back to baseline. Encourage oral diet, supplements. Avoid nephrotoxic drugs. Monitor daily renal function.  Hypertensive urgency: His initial BP lower side improved with IV hydration. BP very high today, resumed home medication including enalapril, clonidine, Imdur, metoprolol. Hydralazine 25 mg 3 times daily ordered. Continue to monitor BP closely.  Anxiety and depression Continue Lexapro therapy. Klonopin as  needed at bedtime.     Out of bed to chair. Incentive spirometry. Nursing supportive care. Fall, aspiration precautions. Diet:  Diet Orders (From admission, onward)     Start     Ordered   11/22/23 1024  Diet Heart Room service appropriate? Yes; Fluid consistency: Thin  Diet effective now       Question Answer Comment  Room service appropriate? Yes   Fluid consistency: Thin      11/22/23 1023           DVT prophylaxis: heparin injection 5,000 Units Start: 11/20/23 1445  Level of care: Tele medical   Code Status: Full Code  Subjective: Patient is seen and examined today morning after TEE.  Patient is more sleepy and lethargic.  We does not wish to go home today.  Did not work with PT.    Physical Exam: Vitals:   11/22/23 1123 11/22/23 1151 11/22/23 1209 11/22/23 1413  BP: (!) 198/140 (!) 193/119 (!) 200/133 (!) 193/137  Pulse:   88 92  Resp:   18   Temp:   97.8 F (36.6 C)   TempSrc:      SpO2:   99%   Weight:      Height:        General - Elderly Caucasian male, no apparent distress HEENT - PERRLA, EOMI, atraumatic head, non tender sinuses. Lung - Clear, basal rales, no rhonchi, wheezes. Heart - S1, S2 heard, no murmurs, rubs, trace pedal edema. Abdomen - Soft, non tender, bowel sounds good Neuro - Alert, awake and oriented bilateral lower extremities weak. Skin - Warm and dry.  Data Reviewed:      Latest Ref Rng & Units 11/20/2023    1:05 PM 10/05/2023    6:25 AM 10/03/2023    3:57 AM  CBC  WBC 4.0 -  10.5 K/uL 7.1  3.2  4.4   Hemoglobin 13.0 - 17.0 g/dL 16.1  09.6  04.5   Hematocrit 39.0 - 52.0 % 47.1  41.0  45.2   Platelets 150 - 400 K/uL 226  157  159       Latest Ref Rng & Units 11/22/2023    4:39 AM 11/21/2023    2:22 AM 11/20/2023    1:05 PM  BMP  Glucose 70 - 99 mg/dL 409  811  914   BUN 8 - 23 mg/dL 24  38  34   Creatinine 0.61 - 1.24 mg/dL 7.82  9.56  2.13   Sodium 135 - 145 mmol/L 137  136  135   Potassium 3.5 - 5.1 mmol/L 4.0  3.8  4.3    Chloride 98 - 111 mmol/L 105  107  103   CO2 22 - 32 mmol/L 22  20  22    Calcium 8.9 - 10.3 mg/dL 08.6  8.5  9.6    ECHO TEE Result Date: 11/22/2023    TRANSESOPHOGEAL ECHO REPORT   Patient Name:   Howard Weaver [facility] Date of Exam: 11/22/2023 Medical Rec #:  [id number]         Height:       69.0 in Accession #:    [id number]        Weight:       200.0 lb Date of Birth:  05-03-1956         BSA:          2.066 m Patient Age:    67 years          BP:           224/156 mmHg Patient Gender: M                 HR:           109 bpm. Exam Location:  [facility] Procedure: 2D Echo, Cardiac Doppler, Color Doppler and Saline Contrast Bubble            Study (Both Spectral and Color Flow Doppler were utilized during            procedure). Indications:     Not listed on TEE check-in sheet  History:         Patient has prior history of Echocardiogram examinations. CAD                  and Previous Myocardial Infarction; Risk Factors:Hypertension.  Sonographer:     Cristela Blue Referring Phys:  [id number] KHAN Diagnosing Phys: Howard Weaver PROCEDURE: The transesophogeal probe was passed without difficulty through the esophogus of the patient. Sedation performed by performing physician. The patient's vital signs; including heart rate, blood pressure, and oxygen saturation; remained stable throughout the procedure. The patient developed no complications during the procedure.  IMPRESSIONS  1. Left ventricular ejection fraction, by estimation, is 60 to 65%. The left ventricle has normal function. The left ventricle has no regional wall motion abnormalities.  2. Right ventricular systolic function is normal. The right ventricular size is normal.  3. NO THROMBII. Left atrial size was mildly dilated. No left atrial/left atrial appendage thrombus was detected.  4. Right atrial size was mildly dilated.  5. The mitral valve is normal in structure. Mild mitral valve regurgitation. No evidence of mitral stenosis.  6. The aortic valve is normal  in structure. Aortic valve regurgitation is moderate. Aortic valve sclerosis/calcification is present, without any evidence  of aortic stenosis.  7. Aortic MILD PLAQUE NON PROTRUDING.  8. The inferior vena cava is normal in size with greater than 50% respiratory variability, suggesting right atrial pressure of 3 mmHg.  9. Agitated saline contrast bubble study was negative, with no evidence of any interatrial shunt. 10. 3D performed for the EF and demonstrates THERE IS MILD NON-PROTRUDING PLAQUE IN AORTIC ARCH. Conclusion(s)/Recommendation(s): Normal biventricular function without evidence of hemodynamically significant valvular heart disease. FINDINGS  Left Ventricle: Left ventricular ejection fraction, by estimation, is 60 to 65%. The left ventricle has normal function. The left ventricle has no regional wall motion abnormalities. The left ventricular internal cavity size was normal in size. There is  no left ventricular hypertrophy. Right Ventricle: The right ventricular size is normal. No increase in right ventricular wall thickness. Right ventricular systolic function is normal. Left Atrium: NO THROMBII. Left atrial size was mildly dilated. No left atrial/left atrial appendage thrombus was detected. Right Atrium: Right atrial size was mildly dilated. Pericardium: There is no evidence of pericardial effusion. Mitral Valve: The mitral valve is normal in structure. Mild mitral valve regurgitation. No evidence of mitral valve stenosis. Tricuspid Valve: The tricuspid valve is normal in structure. Tricuspid valve regurgitation is mild . No evidence of tricuspid stenosis. Aortic Valve: The aortic valve is normal in structure. Aortic valve regurgitation is moderate. Aortic valve sclerosis/calcification is present, without any evidence of aortic stenosis. Pulmonic Valve: The pulmonic valve was normal in structure. Pulmonic valve regurgitation is not visualized. No evidence of pulmonic stenosis. Aorta: The aortic root is  normal in size and structure and MILD PLAQUE NON PROTRUDING. Venous: The inferior vena cava is normal in size with greater than 50% respiratory variability, suggesting right atrial pressure of 3 mmHg. IAS/Shunts: No atrial level shunt detected by color flow Doppler. Agitated saline contrast was given intravenously to evaluate for intracardiac shunting. Agitated saline contrast bubble study was negative, with no evidence of any interatrial shunt. There  is no evidence of a patent foramen ovale. There is no evidence of an atrial septal defect. Additional Comments: 3D was performed not requiring image post processing on an independent workstation and was indeterminate. Howard Weaver Electronically signed by Howard Weaver Signature Date/Time: 11/22/2023/11:27:04 AM    Final    MR BRAIN WO CONTRAST Result Date: 11/20/2023 CLINICAL DATA:  Initial evaluation for acute neuro deficit, stroke suspected. EXAM: MRI HEAD WITHOUT CONTRAST TECHNIQUE: Multiplanar, multiecho pulse sequences of the brain and surrounding structures were obtained without intravenous contrast. COMPARISON:  Comparison made with CTs from earlier the same day as well as previous MRI from 01/15/2020 FINDINGS: Brain: Mild age-related cerebral atrophy. Patchy and confluent T2/FLAIR hyperintensity involving the periventricular and deep white matter both cerebral hemispheres as well as the pons, most characteristic of chronic microvascular ischemic disease, moderate to advanced in nature. Few small associated lacunar infarcts about the bilateral basal ganglia and thalami. Postoperative changes from prior left retrosigmoid craniotomy with underlying chronic encephalomalacia within the underlying left cerebral hemisphere. Probable sequelae of remote right frontal ventriculostomy noted. Scattered patchy subcentimeter foci of restricted diffusion seen involving the posterior left frontoparietal and temporal lobes, consistent with small acute ischemic infarcts  (series 9, images 41-23). Largest area of infarction measures 6 mm at the left temporal lobe (series 9, image 23). Minimal involvement of the left thalamus (series 9, image 26). No associated hemorrhage or mass effect. An additional subcentimeter acute ischemic nonhemorrhagic infarct present at the anterior right basal ganglia (series 9, image 23). Additional possible  punctate acute ischemic nonhemorrhagic infarct noted at the superior left cerebellum (series 9, image 14). A central thromboembolic etiology is suspected given the various vascular distributions involved. Gray-white matter differentiation otherwise maintained. No acute intracranial hemorrhage. Multiple scattered chronic micro hemorrhages noted, most pronounced about the cerebellum and thalami, likely related to chronic poorly controlled hypertension. Chronic hemosiderin staining noted about the chronic left cerebellar encephalomalacia. 1 cm focus of susceptibility artifact present at the mesial left temporal lobe (series 3, image 19). Underlying mild T2 hyperintensity with T2 hypointense rim. Focal hyperdensity at this location on prior CT. Finding suspected to reflect a small cavernoma. No other mass lesion, mass effect or midline shift. Mild ventricular prominence related to global parenchymal volume loss without hydrocephalus. No extra-axial fluid collection. Partially empty sella noted. Vascular: Major intracranial vascular flow voids are well maintained. Skull and upper cervical spine: Craniocervical junction within normal limits. Bone marrow signal intensity normal. No acute scalp soft tissue abnormality. Post craniotomy changes on the left. Sinuses/Orbits: Globes and orbital soft tissues within normal limits. Mucosal thickening with small air-fluid level present within the left maxillary sinus. Paranasal sinuses are otherwise largely clear. Small right mastoid effusion noted, of doubtful significance. Image nasopharynx unremarkable. Other: None.  IMPRESSION: 1. Scattered subcentimeter acute ischemic nonhemorrhagic infarcts involving the posterior left frontoparietal and temporal lobes, with additional small acute ischemic nonhemorrhagic infarct at the anterior right basal ganglia. Additional possible punctate acute ischemic nonhemorrhagic infarct at the superior left cerebellum. A central thromboembolic etiology is suspected given the various vascular distributions involved. 2. Underlying age-related cerebral atrophy with moderate to advanced chronic microvascular ischemic disease. 3. 1 cm focus of susceptibility artifact at the mesial left temporal lobe, suspected to reflect a small cavernoma. 4. Postoperative changes from prior left retrosigmoid craniotomy with underlying chronic encephalomalacia within the left cerebellar hemisphere. Electronically Signed   By: Rise Mu M.D.   On: 11/20/2023 22:06   DG Lumbar Spine 2-3 Views Result Date: 11/20/2023 CLINICAL DATA:  Back pain. Chronic kidney disease. Fall 2 days ago. EXAM: LUMBAR SPINE - 2-3 VIEW COMPARISON:  06/23/2013.  CT of the abdomen and pelvis of 10/02/2023 FINDINGS: Five lumbar type vertebral bodies. Contrast within normal caliber collecting systems from today's CTA head. Normal caliber contrast filled bladder. Maintenance of vertebral body height and alignment. Loss of intervertebral disc height at L4-5 and less fight less so L5-S1. Facets are well-aligned. Aortic atherosclerosis. IMPRESSION: Lumbar spondylosis, without acute osseous abnormality. Aortic Atherosclerosis (ICD10-I70.0). Electronically Signed   By: Jeronimo Greaves M.D.   On: 11/20/2023 16:30   US RENAL Result Date: 11/20/2023 CLINICAL DATA:  Acute kidney injury EXAM: RENAL / URINARY TRACT ULTRASOUND COMPLETE COMPARISON:  CT abdomen 10/02/2023 FINDINGS: Right Kidney: Renal measurements: 12.4 by 5.4 by 5.3 cm = volume: 189 mL. Echogenicity within normal limits. No mass or hydronephrosis visualized. 1.0 cm upper pole benign  cyst noted. Left Kidney: Renal measurements: 12.4 by 5.5 by 5.8 cm = volume: 206 mL. Echogenicity within normal limits. No mass or hydronephrosis visualized. 1.3 cm upper pole benign cyst noted. Bladder: Debris observed in the urinary bladder. Bilateral ureteral jets are visible. Other: None. IMPRESSION: 1. No hydronephrosis. 2. Debris in the urinary bladder. Correlate with urinalysis in assessing for urinary tract infection. Electronically Signed   By: Gaylyn Rong M.D.   On: 11/20/2023 16:10    Family Communication: Discussed with patient, he understand and agree. All questions answered.  Disposition: Status is: inpatient for stroke work up, PT eval for dc plan.  Planned Discharge Destination: Home and Home with Home Health     Time spent: 38 minutes  Author: Marcelino Duster, MD 11/22/2023 2:23 PM Secure chat 7am to 7pm For on call review [email or web address].

## 2023-11-22 NOTE — Progress Notes (Signed)
 SUBJECTIVE: Patient denies any chest pain or shortness of breath   Vitals:   11/22/23 0815 11/22/23 0830 11/22/23 1007 11/22/23 1123  BP: (!) 177/124 (!) 177/122 (!) 191/128 (!) 198/140  Pulse: 96 98 97   Resp: (!) 25 (!) 21    Temp:      TempSrc:      SpO2: 93% 92% 97%   Weight:      Height:        Intake/Output Summary (Last 24 hours) at 11/22/2023 1128 Last data filed at 11/22/2023 0853 Gross per 24 hour  Intake 56.85 ml  Output 1425 ml  Net -1368.15 ml    LABS: Basic Metabolic Panel: Recent Labs    11/21/23 0222 11/22/23 0439  NA 136 137  K 3.8 4.0  CL 107 105  CO2 20* 22  GLUCOSE 101* 119*  BUN 38* 24*  CREATININE 2.63* 1.37*  CALCIUM 8.5* 10.1   Liver Function Tests: Recent Labs    11/20/23 1305  AST 17  ALT 11  ALKPHOS 62  BILITOT 0.9  PROT 7.8  ALBUMIN 3.8   No results for input(s): "LIPASE", "AMYLASE" in the last 72 hours. CBC: Recent Labs    11/20/23 1305  WBC 7.1  NEUTROABS 4.6  HGB 15.4  HCT 47.1  MCV 86.3  PLT 226   Cardiac Enzymes: Recent Labs    11/20/23 1305  CKTOTAL 57   BNP: Invalid input(s): "POCBNP" D-Dimer: No results for input(s): "DDIMER" in the last 72 hours. Hemoglobin A1C: Recent Labs    11/20/23 1310  HGBA1C 5.7*   Fasting Lipid Panel: Recent Labs    11/21/23 0222  CHOL 100  HDL 25*  LDLCALC 51  TRIG 295  CHOLHDL 4.0   Thyroid Function Tests: No results for input(s): "TSH", "T4TOTAL", "T3FREE", "THYROIDAB" in the last 72 hours.  Invalid input(s): "FREET3" Anemia Panel: No results for input(s): "VITAMINB12", "FOLATE", "FERRITIN", "TIBC", "IRON", "RETICCTPCT" in the last 72 hours.   PHYSICAL EXAM General: Well developed, well nourished, in no acute distress HEENT:  Normocephalic and atramatic Neck:  No JVD.  Lungs: Clear bilaterally to auscultation and percussion. Heart: HRRR . Normal S1 and S2 without gallops or murmurs.  Abdomen: Bowel sounds are positive, abdomen soft and non-tender  Msk:   Back normal, normal gait. Normal strength and tone for age. Extremities: No clubbing, cyanosis or edema.   Neuro: Alert and oriented X 3. Psych:  Good affect, responds appropriately  TELEMETRY: Sinus rhythm  ASSESSMENT AND PLAN: Status post embolic stroke with lower extremity weakness.  Patient had transesophageal echocardiogram which showed normal ejection fraction moderate aortic regurgitation mild mitral regurgitation.  Patient had no thrombi in the left atrium left ventricle or the left atrial appendage.  There was mild nonprotruding plaque in the aorta and aortic arch.  Transesophageal echocardiogram study was unremarkable, for Embolic stroke.  Patient does have uncontrolled hypertension with blood pressure running 180/110.  Advised starting the patient on his home medications.  I understand that during stroke we do not want to normalize the blood pressure but may start hydralazine 25 3 times daily and amlodipine 5 mg once a day.  Thank you very much for referral.   ICD-10-CM   1. AKI (acute kidney injury) (HCC)  N17.9     2. Weakness  R53.1       Principal Problem:   AKI (acute kidney injury) (HCC) Active Problems:   Weakness   Acute ischemic stroke (HCC)    Howard Blackwater, MD,  [facility] 11/22/2023 11:28 AM

## 2023-11-22 NOTE — Progress Notes (Addendum)
   11/22/23 1413  Vitals  BP (!) 193/137  BP Location Left Arm  BP Method Automatic  Patient Position (if appropriate) Lying  Pulse Rate 92  Pulse Rate Source Dinamap  MEWS COLOR  MEWS Score Color Green  Pain Assessment  Pain Scale 0-10  Pain Score 0  MEWS Score  MEWS Temp 0  MEWS Systolic 0  MEWS Pulse 0  MEWS RR 0  MEWS LOC 0  MEWS Score 0   Patient's blood pressure 193/137 after clonidine and hydralazine being administered per order. Informed Dr. Clide Dales of continued high blood pressure. Patient currently denies pain. No new orders at this time.

## 2023-11-22 NOTE — Plan of Care (Signed)

## 2023-11-22 NOTE — Progress Notes (Signed)
 PT Cancellation Note  Patient Details Name: Howard Weaver MRN: [id number] DOB: 1956/02/02   Cancelled Treatment:    Reason Eval/Treat Not Completed: Other (comment). Pt continued with elevated BP this date, last reading at 193/197. Not appropriate for PT intervention at this time.   Sandeep Radell 11/22/2023, 3:49 PM Elizabeth Palau, PT, DPT, GCS [phone]

## 2023-11-22 NOTE — Anesthesia Postprocedure Evaluation (Signed)
 Anesthesia Post Note  Patient: Rye Decoste Reha  Procedure(s) Performed: ECHOCARDIOGRAM, TRANSESOPHAGEAL (Right)  Patient location during evaluation: Specials Recovery Anesthesia Type: General Level of consciousness: awake and alert Pain management: pain level controlled Vital Signs Assessment: post-procedure vital signs reviewed and stable Respiratory status: spontaneous breathing, nonlabored ventilation, respiratory function stable and patient connected to nasal cannula oxygen Cardiovascular status: blood pressure returned to baseline and stable Postop Assessment: no apparent nausea or vomiting Anesthetic complications: no   No notable events documented.   Last Vitals:  Vitals:   11/22/23 0807 11/22/23 0815  BP: (!) 177/116 (!) 177/124  Pulse: (!) 104 96  Resp: (!) 30 (!) 25  Temp:    SpO2: 93% 93%    Last Pain:  Vitals:   11/22/23 0803  TempSrc: Oral  PainSc: 0-No pain                 Cleda Mccreedy Chalese Peach

## 2023-11-22 NOTE — Progress Notes (Signed)
 OT Cancellation Note  Patient Details Name: JONEL WELDON MRN: [id number] DOB: [birth year]   Cancelled Treatment:    Reason Eval/Treat Not Completed: Medical issues which prohibited therapy. Consult received, chart reviewed. BP significantly elevated (most recent 191/128). Will re-attempt at later date/time as medically appropriate.   Arman Filter., MPH, MS, OTR/L ascom [phone] 11/22/23, 10:13 AM

## 2023-11-22 NOTE — Progress Notes (Signed)
 Patient experienced an episode of N/V and vomited up a small amount of his dinner with evidently consisted of green beans and rice. He has been up to the bathroom three times so far this shift for a bowel movement. He denies any diarrhea. He has some urgency with this as he does not call for assistance. His linens have been changes twice do to smears. His bed alarm is now set to alert staff when he attempts to get out of bed.

## 2023-11-22 NOTE — Transfer of Care (Signed)
 Immediate Anesthesia Transfer of Care Note  Patient: Howard Weaver  Procedure(s) Performed: ECHOCARDIOGRAM, TRANSESOPHAGEAL (Right)  Patient Location: Short Stay  Anesthesia Type:General  Level of Consciousness: drowsy  Airway & Oxygen Therapy: Patient Spontanous Breathing and Patient connected to nasal cannula oxygen  Post-op Assessment: Report given to RN, Post -op Vital signs reviewed and stable, and Patient moving all extremities  Post vital signs: Reviewed and stable  Last Vitals:  Vitals Value Taken Time  BP 191/126 11/22/23 0803  Temp 36.5 C 11/22/23 0803  Pulse 104 11/22/23 0803  Resp 20 11/22/23 0803  SpO2 92 % 11/22/23 0803    Last Pain:  Vitals:   11/22/23 0803  TempSrc: Oral  PainSc: 0-No pain         Complications: No notable events documented.

## 2023-11-22 NOTE — Anesthesia Preprocedure Evaluation (Signed)
 Anesthesia Evaluation  Patient identified by MRN, date of birth, ID band Patient awake    Reviewed: Allergy & Precautions, NPO status , Patient's Chart, lab work & pertinent test results  History of Anesthesia Complications Negative for: history of anesthetic complications  Airway Mallampati: III  TM Distance: <3 FB Neck ROM: full    Dental  (+) Chipped, Poor Dentition, Missing   Pulmonary neg shortness of breath, former smoker   Pulmonary exam normal        Cardiovascular Exercise Tolerance: Good hypertension, (-) angina + CAD and + Past MI  Normal cardiovascular exam     Neuro/Psych  Neuromuscular disease CVA, Residual Symptoms  negative psych ROS   GI/Hepatic Neg liver ROS, PUD,GERD  Controlled,,  Endo/Other  negative endocrine ROS    Renal/GU Renal disease  negative genitourinary   Musculoskeletal   Abdominal   Peds  Hematology negative hematology ROS (+)   Anesthesia Other Findings Recent stroke. Patient has clearance from neurology for the procedure.  Past Medical History: 09/24/2021: Appendicitis 2018: Bleeding stomach ulcer 2014: Brain aneurysm No date: CAD (coronary artery disease) No date: Chest pain No date: CKD (chronic kidney disease) stage 3, GFR 30-59 ml/min (HCC) No date: Depression No date: DU (duodenal ulcer) 01/16/2020: DVT of leg (deep venous thrombosis) (HCC) No date: GAD (generalized anxiety disorder) No date: Hyperlipidemia No date: Hypertension No date: Myocardial infarction [facility])     Comment:  2017 & 2019 No date: Radiculopathy of thoracolumbar region No date: Stroke [facility])     Comment:  2014 & 2021 No date: SVT (supraventricular tachycardia) (HCC) No date: Tachycardia  Past Surgical History: 11/25/2015: CARDIAC CATHETERIZATION; Right     Comment:  Procedure: Left Heart Cath and Coronary Angiography;                Surgeon: Laurier Nancy, MD;  Location: [facility] INVASIVE CV                LAB;  Service: Cardiovascular;  Laterality: Right; 2014: CEREBRAL ANEURYSM REPAIR 04/12/2014: ESOPHAGOGASTRODUODENOSCOPY No date: STOMACH SURGERY     Comment:  bleeding ulcer repair 01/17/2020: TEE WITHOUT CARDIOVERSION; Right     Comment:  Procedure: TRANSESOPHAGEAL ECHOCARDIOGRAM (TEE);                Surgeon: Laurier Nancy, MD;  Location: [facility] ORS;                Service: Cardiovascular;  Laterality: Right; 11/11/2021: XI ROBOTIC LAPAROSCOPIC ASSISTED APPENDECTOMY; N/A     Comment:  Procedure: XI ROBOTIC LAPAROSCOPIC ASSISTED               APPENDECTOMY;  Surgeon: [name], MD;  Location:               [facility] ORS;  Service: General;  Laterality: N/A;  BMI    Body Mass Index: 29.53 kg/m      Reproductive/Obstetrics negative OB ROS                             Anesthesia Physical Anesthesia Plan  ASA: 4  Anesthesia Plan: General   Post-op Pain Management:    Induction: Intravenous  PONV Risk Score and Plan: Propofol infusion and TIVA  Airway Management Planned: Natural Airway and Nasal Cannula  Additional Equipment:   Intra-op Plan:   Post-operative Plan:   Informed Consent: I have reviewed the patients History and Physical, chart, labs and discussed the  procedure including the risks, benefits and alternatives for the proposed anesthesia with the patient or authorized representative who has indicated his/her understanding and acceptance.     Dental Advisory Given  Plan Discussed with: Anesthesiologist, CRNA and Surgeon  Anesthesia Plan Comments: (Patient consented for risks of anesthesia including but not limited to:  - adverse reactions to medications - risk of airway placement if required - damage to eyes, teeth, lips or other oral mucosa - nerve damage due to positioning  - sore throat or hoarseness - Damage to heart, brain, nerves, lungs, other parts of body or loss of life  Patient voiced understanding and  assent.)       Anesthesia Quick Evaluation

## 2023-11-23 ENCOUNTER — Encounter: Payer: Self-pay | Admitting: Cardiovascular Disease

## 2023-11-23 DIAGNOSIS — I10 Essential (primary) hypertension: Secondary | ICD-10-CM | POA: Diagnosis not present

## 2023-11-23 DIAGNOSIS — E782 Mixed hyperlipidemia: Secondary | ICD-10-CM

## 2023-11-23 DIAGNOSIS — N179 Acute kidney failure, unspecified: Secondary | ICD-10-CM | POA: Diagnosis not present

## 2023-11-23 DIAGNOSIS — R531 Weakness: Secondary | ICD-10-CM | POA: Diagnosis not present

## 2023-11-23 DIAGNOSIS — I639 Cerebral infarction, unspecified: Secondary | ICD-10-CM | POA: Diagnosis not present

## 2023-11-23 MED ORDER — ASPIRIN 81 MG PO TBEC
81.0000 mg | DELAYED_RELEASE_TABLET | Freq: Every day | ORAL | 0 refills | Status: AC
Start: 2023-11-23 — End: ?

## 2023-11-23 MED ORDER — CLOPIDOGREL BISULFATE 75 MG PO TABS: 75.0000 mg | ORAL_TABLET | Freq: Every day | ORAL | 3 refills | Status: AC

## 2023-11-23 NOTE — TOC Transition Note (Addendum)
 Transition of Care [facility]) - Discharge Note   Patient Details  Name: Howard Weaver MRN: [id number] Date of Birth: 22-Jun-1956  Transition of [facility]) CM/SW Contact:  Cherre Blanc, RN Phone Number: 11/23/2023, 1:04 PM   Clinical Narrative:     Patient is medically clear for dc to home with [facility] PT/OT. Fax sent to [facility] [phone] with orders for PT/OT. Request for shower stool sent to Jon with [employer], plan to deliver the stool to the room. TOC provided a Taxi voucher to the patient. Patient is agreeable with the dc plan. No other needs identified.  Final next level of care: Home w Home Health Services Barriers to Discharge: Continued Medical Work up   Patient Goals and CMS Choice            Discharge Placement                       Discharge Plan and Services Additional resources added to the After Visit Summary for                  DME Arranged: Shower stool DME Agency: [employer] Date DME Agency Contacted: 11/23/23 Time DME Agency Contacted: 1303 Representative spoke with at DME Agency: Cletis Athens with [employer] HH Arranged: PT, OT HH Agency: [facility] Date [facility] Agency Contacted: 11/23/23 Time HH Agency Contacted: 1303 Representative spoke with at [facility] Agency: Faxed request to [phone]  Social Drivers of Health (SDOH) Interventions SDOH Screenings   Food Insecurity: No Food Insecurity (11/21/2023)  Housing: Low Risk  (11/21/2023)  Transportation Needs: No Transportation Needs (11/21/2023)  Utilities: Not At Risk (11/21/2023)  Alcohol Screen: Low Risk  (06/10/2022)  Depression (PHQ2-9): Low Risk  (09/24/2022)  Social Connections: Socially Isolated (11/21/2023)  Tobacco Use: Medium Risk (11/20/2023)     Readmission Risk Interventions     No data to display

## 2023-11-23 NOTE — Progress Notes (Addendum)
 Physical Therapy RE-evaluation Patient Details Name: Howard Weaver MRN: [id number] DOB: Dec 27, 1955 Today's Date: 11/23/2023   History of Present Illness Pt admitted for AKI with postive L frontoparietal and temporal lobe CVA. Pt with complaints of falls and B LE pain. History includes craniotomy, depression, HTN, and CAD.    PT Comments  Re-evaluation performed this date due to change in medical status. Pt is making good progress towards goals with ability to ambulate in hallway without AD this date. Pt still presents with unsteadiness and shuffle gait pattern. Still requires CGA for safety and educated to walk with RW in home environment. Pt is hopeful for dc later today. Will continue to progress as able.    If plan is discharge home, recommend the following: A little help with walking and/or transfers;A little help with bathing/dressing/bathroom;Assist for transportation;Help with stairs or ramp for entrance   Can travel by private vehicle        Equipment Recommendations  None recommended by PT    Recommendations for Other Services       Precautions / Restrictions Precautions Precautions: Fall Recall of Precautions/Restrictions: Intact Restrictions Weight Bearing Restrictions Per Provider Order: No     Mobility  Bed Mobility Overal bed mobility: Modified Independent             General bed mobility comments: ease of mobility performed.    Transfers Overall transfer level: Needs assistance Equipment used: None Transfers: Sit to/from Stand Sit to Stand: Supervision           General transfer comment: improved technique this date. Able to stand without AD    Ambulation/Gait Ambulation/Gait assistance: Contact guard assist Gait Distance (Feet): 110 Feet Assistive device: None Gait Pattern/deviations: Step-through pattern       General Gait Details: ambulated in hallway with short shuffled gait. Cues required for sequencing. Needs cues for attention to  task and educated to use RW for all mobility for safety in home environment   Stairs             Wheelchair Mobility     Tilt Bed    Modified Rankin (Stroke Patients Only)       Balance Overall balance assessment: Needs assistance, History of Falls Sitting-balance support: Feet supported, No upper extremity supported Sitting balance-Leahy Scale: Normal     Standing balance support: Bilateral upper extremity supported Standing balance-Leahy Scale: Fair                              Hotel manager: No apparent difficulties  Cognition Arousal: Alert Behavior During Therapy: WFL for tasks assessed/performed   PT - Cognitive impairments: No apparent impairments                       PT - Cognition Comments: alert and oriented Following commands: Intact      Cueing Cueing Techniques: Verbal cues  Exercises      General Comments General comments (skin integrity, edema, etc.): Pt eager to work with therapy to further progress to go home soon.      Pertinent Vitals/Pain Pain Assessment Pain Assessment: No/denies pain    Home Living Family/patient expects to be discharged to:: Private residence Living Arrangements: Children Available Help at Discharge: Family;Available 24 hours/day Type of Home: House Home Access: Stairs to enter Entrance Stairs-Rails: Can reach both Entrance Stairs-Number of Steps: 4   Home Layout: One level Home Equipment: Gilmer Mor -  quad;Rolling Walker (2 wheels)      Prior Function            PT Goals (current goals can now be found in the care plan section) Acute Rehab PT Goals Patient Stated Goal: to go home PT Goal Formulation: With patient Time For Goal Achievement: 12/05/23 Potential to Achieve Goals: Good    Frequency    Min 2X/week      PT Plan      Co-evaluation              AM-PAC PT "6 Clicks" Mobility   Outcome Measure  Help needed turning from your  back to your side while in a flat bed without using bedrails?: None Help needed moving from lying on your back to sitting on the side of a flat bed without using bedrails?: None Help needed moving to and from a bed to a chair (including a wheelchair)?: None Help needed standing up from a chair using your arms (e.g., wheelchair or bedside chair)?: None Help needed to walk in hospital room?: A Little Help needed climbing 3-5 steps with a railing? : A Little 6 Click Score: 22    End of Session   Activity Tolerance: Patient tolerated treatment well Patient left: in bed;with bed alarm set Nurse Communication: Mobility status PT Visit Diagnosis: Difficulty in walking, not elsewhere classified (R26.2);Unsteadiness on feet (R26.81);Muscle weakness (generalized) (M62.81)     Time: 5409-8119 PT Time Calculation (min) (ACUTE ONLY): 8 min  Charges:    $Gait Training: 8-22 mins PT General Charges $$ ACUTE PT VISIT: 1 Visit                     Elizabeth Palau, PT, DPT, GCS [phone]    Howard Weaver 11/23/2023, 12:36 PM

## 2023-11-23 NOTE — Progress Notes (Signed)
 Occupational Therapy Evaluation Patient Details Name: Howard Weaver MRN: [id number] DOB: May 21, 1956 Today's Date: 11/23/2023   History of Present Illness   Pt admitted for AKI with postive L frontoparietal and temporal lobe CVA. Pt with complaints of falls and B LE pain. History includes craniotomy, depression, HTN, and CAD.     Clinical Impressions Pt was seen for OT evaluation this date. Pt alert and oriented x4, eager to work in session. Prior to hospital admission, pt was indep/MODI in ADL tasks, pt receives assistance PRN from son with IADL (grocery shopping). Pt lives with his son who works full time. Pt presents to acute OT demonstrating impaired ADL performance, functional mobility 2/2 (See OT problem list for additional functional deficits). Pt reports frequent history of falls within the home, other than fall prevention pt is confident he can return home. Pt completed bed mobility with MODI and STS from EOB with no physical assistance, pt utilizes bed rails for support. Pt amb with RW to bathroom with CGA; Pt completed sink level grooming tasks with RW, cues for sequencing required for safety with DME management during ADL tasks. Pt required no physical assistance to completed tasks. Pt then amb within the hallway with RW + SBA throughout, no LOB noted good adherence to RW management. Pt returned to room with breakfast set up in recliner. Pt would benefit from skilled OT services to address noted impairments and functional limitations (see below for any additional details) in order to maximize safety and independence while minimizing falls risk and caregiver burden. OT will follow acutely.      If plan is discharge home, recommend the following:   A little help with walking and/or transfers;A little help with bathing/dressing/bathroom;Assistance with cooking/housework;Assist for transportation;Help with stairs or ramp for entrance     Functional Status Assessment   Patient has  had a recent decline in their functional status and demonstrates the ability to make significant improvements in function in a reasonable and predictable amount of time.     Equipment Recommendations   None recommended by OT     Recommendations for Other Services         Precautions/Restrictions   Precautions Precautions: Fall Recall of Precautions/Restrictions: Intact Restrictions Weight Bearing Restrictions Per Provider Order: No     Mobility Bed Mobility Overal bed mobility: Modified Independent                  Transfers Overall transfer level: Needs assistance Equipment used: Rolling walker (2 wheels) Transfers: Sit to/from Stand Sit to Stand: Contact guard assist           General transfer comment: No physical assistance required, pt utilizes bed rails and pushes up from bed      Balance Overall balance assessment: Needs assistance, History of Falls Sitting-balance support: Feet supported, No upper extremity supported Sitting balance-Leahy Scale: Normal     Standing balance support: Bilateral upper extremity supported Standing balance-Leahy Scale: Fair                             ADL either performed or assessed with clinical judgement   ADL Overall ADL's : Needs assistance/impaired Eating/Feeding: Modified independent;Sitting   Grooming: Wash/dry face;Wash/dry hands;Standing;Set up               Lower Body Dressing: Independent;Bed level   Toilet Transfer: Contact guard assist;Ambulation;Rolling walker (2 wheels) (Simulated)  Functional mobility during ADLs: Contact guard assist;Rolling walker (2 wheels);Cueing for sequencing General ADL Comments: Pt completed sink level grooming tasks with RW, cues for sequencing required for safety with DME management during ADL tasks. Pt required no physical assistance to completed tasks.     Vision         Perception         Praxis         Pertinent  Vitals/Pain Pain Assessment Pain Assessment: Faces Faces Pain Scale: Hurts a little bit Pain Location: LLE Pain Descriptors / Indicators: Discomfort Pain Intervention(s): Monitored during session, Repositioned     Extremity/Trunk Assessment Upper Extremity Assessment Upper Extremity Assessment: Overall WFL for tasks assessed   Lower Extremity Assessment Lower Extremity Assessment: Generalized weakness;Defer to PT evaluation;LLE deficits/detail LLE Deficits / Details: Generalized weak   Cervical / Trunk Assessment Cervical / Trunk Assessment: Normal   Communication Communication Communication: No apparent difficulties   Cognition Arousal: Alert Behavior During Therapy: WFL for tasks assessed/performed Cognition: No family/caregiver present to determine baseline             OT - Cognition Comments: A/O x 4                 Following commands: Intact       Cueing  General Comments   Cueing Techniques: Verbal cues  Pt eager to work with therapy to further progress to go home soon.   Exercises Exercises: Other exercises Other Exercises Other Exercises: Edu: Role of OT acute care, benefits of HH OT, fall risk prevention, safe DME technique during ADLs/IADLs   Shoulder Instructions      Home Living Family/patient expects to be discharged to:: Private residence Living Arrangements: Children Available Help at Discharge: Family;Available 24 hours/day Type of Home: House Home Access: Stairs to enter [employer] of Steps: 4 Entrance Stairs-Rails: Can reach both Home Layout: One level     Bathroom Shower/Tub: Chief Strategy Officer: Standard     Home Equipment: Cane - Programmer, applications (2 wheels)          Prior Functioning/Environment Prior Level of Function : Independent/Modified Independent             Mobility Comments: Pt reports he has been indep with mobility, however has been having multiple falls with QC. ADLs  Comments: PTA pt reports MODI, son assist with IADLs    OT Problem List: Decreased strength;Decreased range of motion;Decreased activity tolerance;Impaired balance (sitting and/or standing);Decreased coordination;Decreased safety awareness;Decreased knowledge of use of DME or AE   OT Treatment/Interventions: Self-care/ADL training;Therapeutic exercise;DME and/or AE instruction      OT Goals(Current goals can be found in the care plan section)   Acute Rehab OT Goals Patient Stated Goal: To return home OT Goal Formulation: With patient Time For Goal Achievement: 12/07/23 Potential to Achieve Goals: Good ADL Goals Pt Will Perform Grooming: with modified independence;standing Pt Will Perform Lower Body Dressing: sit to/from stand;with modified independence Pt Will Transfer to Toilet: with modified independence;ambulating Pt Will Perform Toileting - Clothing Manipulation and hygiene: with modified independence   OT Frequency:  Min 2X/week    Co-evaluation              AM-PAC OT "6 Clicks" Daily Activity     Outcome Measure Help from another person eating meals?: A Little Help from another person taking care of personal grooming?: A Little Help from another person toileting, which includes using toliet, bedpan, or urinal?: A Little Help  from another person bathing (including washing, rinsing, drying)?: A Little Help from another person to put on and taking off regular upper body clothing?: None Help from another person to put on and taking off regular lower body clothing?: A Little 6 Click Score: 19   End of Session Equipment Utilized During Treatment: Gait belt;Rolling walker (2 wheels) Nurse Communication: Mobility status  Activity Tolerance: Patient tolerated treatment well Patient left: in chair;with call bell/phone within reach;with chair alarm set  OT Visit Diagnosis: Unsteadiness on feet (R26.81);Other abnormalities of gait and mobility (R26.89);Repeated falls  (R29.6);Muscle weakness (generalized) (M62.81)                Time: 1610-9604 OT Time Calculation (min): 22 min Charges:  OT General Charges $OT Visit: 1 Visit OT Evaluation $OT Eval Low Complexity: 1 Low  Celeste Candelas M.S. OTR/L  11/23/23, 10:12 AM

## 2023-11-23 NOTE — Discharge Summary (Signed)
 Physician Discharge Summary   Patient: Howard Weaver MRN: [id number] DOB: [birth year]  Admit date:     11/20/2023  Discharge date: 11/23/23  Discharge Physician: Marcelino Duster   PCP: Sallyanne Kuster, NP   Recommendations at discharge:  {Tip this will not be part of the note when signed- Example include specific recommendations for outpatient follow-up, pending tests to follow-up on. (Optional):26781}  ***  Discharge Diagnoses: Principal Problem:   AKI (acute kidney injury) (HCC) Active Problems:   Weakness   Acute ischemic stroke (HCC)  Resolved Problems:   * No resolved hospital problems. [facility] Course: No notes on file  Assessment and Plan: No notes have been filed under this hospital service. Service: Hospitalist     {Tip this will not be part of the note when signed Body mass index is 29.53 kg/m. , ,  (Optional):26781}  {(NOTE) Pain control PDMP Statment (Optional):26782} Consultants: *** Procedures performed: ***  Disposition: {Plan; Disposition:26390} Diet recommendation:  Discharge Diet Orders (From admission, onward)     Start     Ordered   11/23/23 0000  Diet - low sodium heart healthy        11/23/23 1044           {Diet_Plan:26776} DISCHARGE MEDICATION: Allergies as of 11/23/2023       Reactions   Penicillins Anaphylaxis   Has patient had a PCN reaction causing immediate rash, facial/tongue/throat swelling, SOB or lightheadedness with hypotension: Yes Has patient had a PCN reaction causing severe rash involving mucus membranes or skin necrosis: Yes Has patient had a PCN reaction that required hospitalization Yes Has patient had a PCN reaction occurring within the last 10 years: No If all of the above answers are "NO", then may proceed with Cephalosporin use.   Ibuprofen Other (See Comments)   Gi upset        Medication List     TAKE these medications    aspirin EC 81 MG tablet Take 1 tablet (81 mg total) by mouth  daily. Swallow whole. What changed: additional instructions   clonazePAM 0.5 MG tablet Commonly known as: KLONOPIN Take(0.25 mg ) 1/2  tablet daily.   cloNIDine 0.3 MG tablet Commonly known as: CATAPRES Take 1 tablet (0.3 mg total) by mouth at bedtime.   clopidogrel 75 MG tablet Commonly known as: PLAVIX Take 1 tablet (75 mg total) by mouth daily. Start taking on: November 24, 2023   enalapril 20 MG tablet Commonly known as: VASOTEC Take 2 tablets (40 mg total) by mouth daily.   escitalopram 20 MG tablet Commonly known as: LEXAPRO Take 1 tablet (20 mg total) by mouth daily.   isosorbide mononitrate 30 MG 24 hr tablet Commonly known as: IMDUR Take 1 tablet (30 mg total) by mouth daily.   metoprolol succinate 100 MG 24 hr tablet Commonly known as: TOPROL-XL Take 1 tablet (100 mg total) by mouth daily. Take with or immediately following a meal.   pantoprazole 40 MG tablet Commonly known as: PROTONIX Take 1 tablet (40 mg total) by mouth daily.   rosuvastatin 40 MG tablet Commonly known as: CRESTOR Take 1 tablet (40 mg total) by mouth daily.               Durable Medical Equipment  (From admission, onward)           Start     Ordered   11/23/23 1044  DME Shower stool  Once        11/23/23 1044  Follow-up Information     Abernathy, Arlyss Repress, NP Follow up in 1 week(s).   Specialty: Nurse Practitioner Contact information: [address] [place] [address] [phone]         Laurier Nancy, MD. Go in 1 day(s).   Specialty: Cardiology Why: Holter placement 9AM 11/24/23 Contact information: [address] [place] [address] [phone]                Discharge Exam: Ceasar Mons Weights   11/20/23 1257  Weight: 90.7 kg   ***  Condition at discharge: {DC Condition:26389}  The results of significant diagnostics from this hospitalization (including imaging, microbiology, ancillary and laboratory) are listed below for  reference.   Imaging Studies: ECHO TEE Result Date: 11/22/2023    TRANSESOPHOGEAL ECHO REPORT   Patient Name:   Howard Weaver [facility] Date of Exam: 11/22/2023 Medical Rec #:  [id number]         Height:       69.0 in Accession #:    [id number]        Weight:       200.0 lb Date of Birth:  Dec 07, 1955         BSA:          2.066 m Patient Age:    67 years          BP:           224/156 mmHg Patient Gender: M                 HR:           109 bpm. Exam Location:  [facility] Procedure: 2D Echo, Cardiac Doppler, Color Doppler and Saline Contrast Bubble            Study (Both Spectral and Color Flow Doppler were utilized during            procedure). Indications:     Not listed on TEE check-in sheet  History:         Patient has prior history of Echocardiogram examinations. CAD                  and Previous Myocardial Infarction; Risk Factors:Hypertension.  Sonographer:     Cristela Blue Referring Phys:  [id number] [facility] A KHAN Diagnosing Phys: Adrian Blackwater PROCEDURE: The transesophogeal probe was passed without difficulty through the esophogus of the patient. Sedation performed by performing physician. The patient's vital signs; including heart rate, blood pressure, and oxygen saturation; remained stable throughout the procedure. The patient developed no complications during the procedure.  IMPRESSIONS  1. Left ventricular ejection fraction, by estimation, is 60 to 65%. The left ventricle has normal function. The left ventricle has no regional wall motion abnormalities.  2. Right ventricular systolic function is normal. The right ventricular size is normal.  3. NO THROMBII. Left atrial size was mildly dilated. No left atrial/left atrial appendage thrombus was detected.  4. Right atrial size was mildly dilated.  5. The mitral valve is normal in structure. Mild mitral valve regurgitation. No evidence of mitral stenosis.  6. The aortic valve is normal in structure. Aortic valve regurgitation is moderate. Aortic valve  sclerosis/calcification is present, without any evidence of aortic stenosis.  7. Aortic MILD PLAQUE NON PROTRUDING.  8. The inferior vena cava is normal in size with greater than 50% respiratory variability, suggesting right atrial pressure of 3 mmHg.  9. Agitated saline contrast bubble study was negative, with no evidence of any interatrial shunt. 10.  3D performed for the EF and demonstrates THERE IS MILD NON-PROTRUDING PLAQUE IN AORTIC ARCH. Conclusion(s)/Recommendation(s): Normal biventricular function without evidence of hemodynamically significant valvular heart disease. FINDINGS  Left Ventricle: Left ventricular ejection fraction, by estimation, is 60 to 65%. The left ventricle has normal function. The left ventricle has no regional wall motion abnormalities. The left ventricular internal cavity size was normal in size. There is  no left ventricular hypertrophy. Right Ventricle: The right ventricular size is normal. No increase in right ventricular wall thickness. Right ventricular systolic function is normal. Left Atrium: NO THROMBII. Left atrial size was mildly dilated. No left atrial/left atrial appendage thrombus was detected. Right Atrium: Right atrial size was mildly dilated. Pericardium: There is no evidence of pericardial effusion. Mitral Valve: The mitral valve is normal in structure. Mild mitral valve regurgitation. No evidence of mitral valve stenosis. Tricuspid Valve: The tricuspid valve is normal in structure. Tricuspid valve regurgitation is mild . No evidence of tricuspid stenosis. Aortic Valve: The aortic valve is normal in structure. Aortic valve regurgitation is moderate. Aortic valve sclerosis/calcification is present, without any evidence of aortic stenosis. Pulmonic Valve: The pulmonic valve was normal in structure. Pulmonic valve regurgitation is not visualized. No evidence of pulmonic stenosis. Aorta: The aortic root is normal in size and structure and MILD PLAQUE NON PROTRUDING. Venous:  The inferior vena cava is normal in size with greater than 50% respiratory variability, suggesting right atrial pressure of 3 mmHg. IAS/Shunts: No atrial level shunt detected by color flow Doppler. Agitated saline contrast was given intravenously to evaluate for intracardiac shunting. Agitated saline contrast bubble study was negative, with no evidence of any interatrial shunt. There  is no evidence of a patent foramen ovale. There is no evidence of an atrial septal defect. Additional Comments: 3D was performed not requiring image post processing on an independent workstation and was indeterminate. Adrian Blackwater Electronically signed by Adrian Blackwater Signature Date/Time: 11/22/2023/11:27:04 AM    Final    MR BRAIN WO CONTRAST Result Date: 11/20/2023 CLINICAL DATA:  Initial evaluation for acute neuro deficit, stroke suspected. EXAM: MRI HEAD WITHOUT CONTRAST TECHNIQUE: Multiplanar, multiecho pulse sequences of the brain and surrounding structures were obtained without intravenous contrast. COMPARISON:  Comparison made with CTs from earlier the same day as well as previous MRI from 01/15/2020 FINDINGS: Brain: Mild age-related cerebral atrophy. Patchy and confluent T2/FLAIR hyperintensity involving the periventricular and deep white matter both cerebral hemispheres as well as the pons, most characteristic of chronic microvascular ischemic disease, moderate to advanced in nature. Few small associated lacunar infarcts about the bilateral basal ganglia and thalami. Postoperative changes from prior left retrosigmoid craniotomy with underlying chronic encephalomalacia within the underlying left cerebral hemisphere. Probable sequelae of remote right frontal ventriculostomy noted. Scattered patchy subcentimeter foci of restricted diffusion seen involving the posterior left frontoparietal and temporal lobes, consistent with small acute ischemic infarcts (series 9, images 41-23). Largest area of infarction measures 6 mm at the  left temporal lobe (series 9, image 23). Minimal involvement of the left thalamus (series 9, image 26). No associated hemorrhage or mass effect. An additional subcentimeter acute ischemic nonhemorrhagic infarct present at the anterior right basal ganglia (series 9, image 23). Additional possible punctate acute ischemic nonhemorrhagic infarct noted at the superior left cerebellum (series 9, image 14). A central thromboembolic etiology is suspected given the various vascular distributions involved. Gray-white matter differentiation otherwise maintained. No acute intracranial hemorrhage. Multiple scattered chronic micro hemorrhages noted, most pronounced about the cerebellum and thalami, likely  related to chronic poorly controlled hypertension. Chronic hemosiderin staining noted about the chronic left cerebellar encephalomalacia. 1 cm focus of susceptibility artifact present at the mesial left temporal lobe (series 3, image 19). Underlying mild T2 hyperintensity with T2 hypointense rim. Focal hyperdensity at this location on prior CT. Finding suspected to reflect a small cavernoma. No other mass lesion, mass effect or midline shift. Mild ventricular prominence related to global parenchymal volume loss without hydrocephalus. No extra-axial fluid collection. Partially empty sella noted. Vascular: Major intracranial vascular flow voids are well maintained. Skull and upper cervical spine: Craniocervical junction within normal limits. Bone marrow signal intensity normal. No acute scalp soft tissue abnormality. Post craniotomy changes on the left. Sinuses/Orbits: Globes and orbital soft tissues within normal limits. Mucosal thickening with small air-fluid level present within the left maxillary sinus. Paranasal sinuses are otherwise largely clear. Small right mastoid effusion noted, of doubtful significance. Image nasopharynx unremarkable. Other: None. IMPRESSION: 1. Scattered subcentimeter acute ischemic nonhemorrhagic  infarcts involving the posterior left frontoparietal and temporal lobes, with additional small acute ischemic nonhemorrhagic infarct at the anterior right basal ganglia. Additional possible punctate acute ischemic nonhemorrhagic infarct at the superior left cerebellum. A central thromboembolic etiology is suspected given the various vascular distributions involved. 2. Underlying age-related cerebral atrophy with moderate to advanced chronic microvascular ischemic disease. 3. 1 cm focus of susceptibility artifact at the mesial left temporal lobe, suspected to reflect a small cavernoma. 4. Postoperative changes from prior left retrosigmoid craniotomy with underlying chronic encephalomalacia within the left cerebellar hemisphere. Electronically Signed   By: Rise Mu M.D.   On: 11/20/2023 22:06   DG Lumbar Spine 2-3 Views Result Date: 11/20/2023 CLINICAL DATA:  Back pain. Chronic kidney disease. Fall 2 days ago. EXAM: LUMBAR SPINE - 2-3 VIEW COMPARISON:  06/23/2013.  CT of the abdomen and pelvis of 10/02/2023 FINDINGS: Five lumbar type vertebral bodies. Contrast within normal caliber collecting systems from today's CTA head. Normal caliber contrast filled bladder. Maintenance of vertebral body height and alignment. Loss of intervertebral disc height at L4-5 and less fight less so L5-S1. Facets are well-aligned. Aortic atherosclerosis. IMPRESSION: Lumbar spondylosis, without acute osseous abnormality. Aortic Atherosclerosis (ICD10-I70.0). Electronically Signed   By: Jeronimo Greaves M.D.   On: 11/20/2023 16:30   US RENAL Result Date: 11/20/2023 CLINICAL DATA:  Acute kidney injury EXAM: RENAL / URINARY TRACT ULTRASOUND COMPLETE COMPARISON:  CT abdomen 10/02/2023 FINDINGS: Right Kidney: Renal measurements: 12.4 by 5.4 by 5.3 cm = volume: 189 mL. Echogenicity within normal limits. No mass or hydronephrosis visualized. 1.0 cm upper pole benign cyst noted. Left Kidney: Renal measurements: 12.4 by 5.5 by 5.8 cm =  volume: 206 mL. Echogenicity within normal limits. No mass or hydronephrosis visualized. 1.3 cm upper pole benign cyst noted. Bladder: Debris observed in the urinary bladder. Bilateral ureteral jets are visible. Other: None. IMPRESSION: 1. No hydronephrosis. 2. Debris in the urinary bladder. Correlate with urinalysis in assessing for urinary tract infection. Electronically Signed   By: Gaylyn Rong M.D.   On: 11/20/2023 16:10   CT ANGIO HEAD NECK W WO CM W PERF (CODE STROKE) Result Date: 11/20/2023 CLINICAL DATA:  Leg weakness.  Code stroke. EXAM: CT ANGIOGRAPHY HEAD AND NECK CT PERFUSION BRAIN TECHNIQUE: Multidetector CT imaging of the head and neck was performed using the standard protocol during bolus administration of intravenous contrast. Multiplanar CT image reconstructions and MIPs were obtained to evaluate the vascular anatomy. Carotid stenosis measurements (when applicable) are obtained utilizing NASCET criteria, using the  distal internal carotid diameter as the denominator. Multiphase CT imaging of the brain was performed following IV bolus contrast injection. Subsequent parametric perfusion maps were calculated using RAPID software. RADIATION DOSE REDUCTION: This exam was performed according to the departmental dose-optimization program which includes automated exposure control, adjustment of the mA and/or kV according to patient size and/or use of iterative reconstruction technique. CONTRAST:  OMNIPAQUE IOHEXOL 350 MG/ML SOLN COMPARISON:  CT head without contrast 11/20/2023. CT angio head 01/15/2020. FINDINGS: CTA NECK FINDINGS The timing of the contrast bolus is suboptimal. Contrast is densest within the right atrium. Aortic arch: Atherosclerotic calcifications are present at the aortic arch and great vessel origins. No significant stenosis is present. No aneurysm or dissection is present. Right carotid system: Mild irregular wall thickening is present throughout the right common carotid  artery. No focal stenosis is present. Atherosclerotic changes are present at the aortic arch. Proximal right ICA measures 2 mm. This compares with a more distal measurement of 5 mm. Left carotid system: The left common carotid artery is within normal limits. Atherosclerotic calcifications are present at the aortic arch the proximal right ICA scratched at the proximal left ICA is narrowed to 2.7 mm. No significant stenosis of greater than 50% is present. Cervical left ICA is otherwise normal. Vertebral arteries: The vertebral arteries are codominant. Both vertebral arteries originate from the subclavian arteries. Decreased scratched a decreased contrast density is present throughout the right vertebral artery without a focal stenosis. No significant stenosis is present on the left. Skeleton: The vertebral body heights are normal. Other neck: The soft tissues of the neck are otherwise unremarkable. Salivary glands are within normal limits. Thyroid is normal. No significant adenopathy is present. No focal mucosal or submucosal lesions are present. Upper chest: Lung apices are clear bilaterally. Thoracic inlet is within normal limits. Review of the MIP images confirms the above findings CTA HEAD FINDINGS Anterior circulation: Atherosclerotic calcifications are present within the cavernous internal carotid arteries bilaterally. No significant stenosis is present through the ICA termini. The A1 and M1 segments are normal. The anterior communicating artery is patent. Marked attenuation of ACA and MCA branch vessels bilaterally is likely secondary to poor cardiac output. No focal stenosis or branch vessel occlusion is present. Posterior circulation: Moderate stenosis is present the distal right V4 segment. This may account for decreased density in the more proximal right vertebral artery. Calcifications are present in the left V4 segment without focal stenosis. The vertebrobasilar junction and basilar artery normal. Both  posterior cerebral arteries originate from basilar tip. Moderate 10 UA shin of PCA branch vessels is noted bilaterally, again likely secondary to poor cardiac output. Venous sinuses: The dural sinuses are patent. The straight sinus and deep cerebral veins are intact. Cortical veins are within normal limits. No significant vascular malformation is evident. Anatomic variants: None Review of the MIP images confirms the above findings CT Brain Perfusion Findings: ASPECTS: 10/10 The CT perfusion maps suggest ischemia predominantly within the cerebellum. This is likely artifactual. Core infarct is mapped to a remote infarct. Moderate patient motion is present. IMPRESSION: 1. Suboptimal timing of the contrast bolus limits evaluation of branch vessels. 2. Atherosclerotic changes at the cavernous internal carotid arteries bilaterally without significant stenosis. 3. Moderate stenosis of the distal right V4 segment. This may account for decreased density in the more proximal right vertebral artery. 4. CT perfusion maps suggest ischemia predominantly within the cerebellum. Significant patient motion is present on the CT perfusion images. The results are likely  artifactual. 5.  Aortic Atherosclerosis (ICD10-I70.0). These results were called by telephone at the time of interpretation on 11/20/2023 at 1:51 pm to provider Dr. Amada Jupiter, who verbally acknowledged these results. Electronically Signed   By: Marin Roberts M.D.   On: 11/20/2023 13:51   CT HEAD CODE STROKE WO CONTRAST Result Date: 11/20/2023 CLINICAL DATA:  Code stroke. Slurred speech. Right-sided facial droop. EXAM: CT HEAD WITHOUT CONTRAST TECHNIQUE: Contiguous axial images were obtained from the base of the skull through the vertex without intravenous contrast. RADIATION DOSE REDUCTION: This exam was performed according to the departmental dose-optimization program which includes automated exposure control, adjustment of the mA and/or kV according to  patient size and/or use of iterative reconstruction technique. COMPARISON:  CT head without contrast 09/02/2022. FINDINGS: Brain: Chronic left cerebellar encephalomalacia is stable. A remote cortical infarct in the anterior right frontal lobe is stable. Mild atrophy and moderate diffuse white matter disease is similar the prior study. No acute infarct, hemorrhage, or mass lesion is present. Deep brain nuclei are within normal limits. The ventricles are of normal size. No significant extraaxial fluid collection is present. Mild ex vacuo dilation of the right lateral ventricle is stable. No significant extraaxial fluid collection is present. The brainstem and right cerebellum are within normal limits. A mildly enlarged empty sella is stable. Midline structures are otherwise within normal limits. Vascular: Atherosclerotic calcifications are present within the cavernous internal carotid arteries and at the dural margin of both vertebral arteries. No hyperdense vessel is present. Skull: Calvarium is otherwise intact. The small lipoma is present in the left temporal scalp stable. Scalp is otherwise unremarkable. Sinuses/Orbits: Mild mucosal thickening is present along the inferior left maxillary sinus. The paranasal sinuses and mastoid air cells are otherwise clear. The globes and orbits are within normal limits. ASPECTS [facility] Stroke Program Early CT Score) - Ganglionic level infarction (caudate, lentiform nuclei, internal capsule, insula, M1-M3 cortex): 7/7 - Supraganglionic infarction (M4-M6 cortex): 3/3 Total score (0-10 with 10 being normal): 10/10 IMPRESSION: 1. No acute intracranial abnormality or significant interval change. 2. Stable chronic left cerebellar encephalomalacia. 3. Stable remote cortical infarct of the anterior right frontal lobe. 4. Stable mild atrophy and moderate diffuse white matter disease. This likely reflects the sequela of chronic microvascular ischemia. 5. Aspects is 10/10. The above was  relayed via text pager to Dr. Amada Jupiter on 11/20/2023 at 13:21 . Electronically Signed   By: Marin Roberts M.D.   On: 11/20/2023 13:22    Microbiology: Results for orders placed or performed during the hospital encounter of 10/02/23  Resp panel by RT-PCR (RSV, Flu A&B, Covid) Anterior Nasal Swab     Status: Abnormal   Collection Time: 10/02/23  3:12 AM   Specimen: Anterior Nasal Swab  Result Value Ref Range Status   SARS Coronavirus 2 by RT PCR NEGATIVE NEGATIVE Final    Comment: (NOTE) SARS-CoV-2 target nucleic acids are NOT DETECTED.  The SARS-CoV-2 RNA is generally detectable in upper respiratory specimens during the acute phase of infection. The lowest concentration of SARS-CoV-2 viral copies this assay can detect is 138 copies/mL. A negative result does not preclude SARS-Cov-2 infection and should not be used as the sole basis for treatment or other patient management decisions. A negative result may occur with  improper specimen collection/handling, submission of specimen other than nasopharyngeal swab, presence of viral mutation(s) within the areas targeted by this assay, and inadequate number of viral copies(<138 copies/mL). A negative result must be combined with clinical observations, patient  history, and epidemiological information. The expected result is Negative.  Fact Sheet for Patients:  BloggerCourse.com  Fact Sheet for Healthcare Providers:  SeriousBroker.it  This test is no t yet approved or cleared by the [place] FDA and  has been authorized for detection and/or diagnosis of SARS-CoV-2 by FDA under an Emergency Use Authorization (EUA). This EUA will remain  in effect (meaning this test can be used) for the duration of the COVID-19 declaration under Section 564(b)(1) of the Act, 21 U.S.C.section 360bbb-3(b)(1), unless the authorization is terminated  or revoked sooner.       Influenza A by PCR  POSITIVE (A) NEGATIVE Final   Influenza B by PCR NEGATIVE NEGATIVE Final    Comment: (NOTE) The Xpert Xpress SARS-CoV-2/FLU/RSV plus assay is intended as an aid in the diagnosis of influenza from Nasopharyngeal swab specimens and should not be used as a sole basis for treatment. Nasal washings and aspirates are unacceptable for Xpert Xpress SARS-CoV-2/FLU/RSV testing.  Fact Sheet for Patients: BloggerCourse.com  Fact Sheet for Healthcare Providers: SeriousBroker.it  This test is not yet approved or cleared by the [place] FDA and has been authorized for detection and/or diagnosis of SARS-CoV-2 by FDA under an Emergency Use Authorization (EUA). This EUA will remain in effect (meaning this test can be used) for the duration of the COVID-19 declaration under Section 564(b)(1) of the Act, 21 U.S.C. section 360bbb-3(b)(1), unless the authorization is terminated or revoked.     Resp Syncytial Virus by PCR NEGATIVE NEGATIVE Final    Comment: (NOTE) Fact Sheet for Patients: BloggerCourse.com  Fact Sheet for Healthcare Providers: SeriousBroker.it  This test is not yet approved or cleared by the [place] FDA and has been authorized for detection and/or diagnosis of SARS-CoV-2 by FDA under an Emergency Use Authorization (EUA). This EUA will remain in effect (meaning this test can be used) for the duration of the COVID-19 declaration under Section 564(b)(1) of the Act, 21 U.S.C. section 360bbb-3(b)(1), unless the authorization is terminated or revoked.  Performed at [facility], [address] [place], [place] [address]     Labs: CBC: Recent Labs  Lab 11/20/23 1305  WBC 7.1  NEUTROABS 4.6  HGB 15.4  HCT 47.1  MCV 86.3  PLT 226   Basic Metabolic Panel: Recent Labs  Lab 11/20/23 1305 11/21/23 0222 11/22/23 0439  NA 135 136 137  K 4.3 3.8 4.0  CL  103 107 105  CO2 22 20* 22  GLUCOSE 126* 101* 119*  BUN 34* 38* 24*  CREATININE 3.33* 2.63* 1.37*  CALCIUM 9.6 8.5* 10.1   Liver Function Tests: Recent Labs  Lab 11/20/23 1305  AST 17  ALT 11  ALKPHOS 62  BILITOT 0.9  PROT 7.8  ALBUMIN 3.8   CBG: Recent Labs  Lab 11/20/23 1305  GLUCAP 116*    Discharge time spent: {LESS THAN/GREATER THAN:26388} 30 minutes.  Signed: Marcelino Duster, MD [facility] 11/23/2023

## 2023-11-23 NOTE — Plan of Care (Signed)

## 2023-11-28 ENCOUNTER — Inpatient Hospital Stay: Admitting: Physician Assistant

## 2023-12-01 ENCOUNTER — Encounter: Payer: Self-pay | Admitting: Nurse Practitioner

## 2023-12-02 ENCOUNTER — Telehealth: Payer: Self-pay | Admitting: Nurse Practitioner

## 2023-12-02 NOTE — Telephone Encounter (Signed)
 Attempted to contact patient to schedule follow up appointment. No answer. VM not set up. Sent message-Toni

## 2023-12-05 ENCOUNTER — Telehealth: Payer: Self-pay | Admitting: Nurse Practitioner

## 2023-12-05 NOTE — Telephone Encounter (Signed)
 Attempted to contact patient to schedule follow up appointment. No answer. VM not set up. Howard Weaver

## 2023-12-06 ENCOUNTER — Telehealth: Payer: Self-pay | Admitting: Nurse Practitioner

## 2023-12-06 NOTE — Telephone Encounter (Signed)
 Patient not returning calls to schedule appointment or to verify if we are still his pcp. Mailed letter to patient. Scanned-Howard Weaver

## 2023-12-07 ENCOUNTER — Telehealth: Payer: Self-pay | Admitting: Cardiovascular Disease

## 2023-12-07 NOTE — Telephone Encounter (Signed)
 Patient's son called in wanting to make sure it is okay if the patient takes Plavix even though he has a stomach ulcer. He was recently in the hospital and they put him on the Plavix then. Please advise if this is okay.

## 2024-01-02 ENCOUNTER — Ambulatory Visit: Admitting: Nurse Practitioner

## 2024-01-12 ENCOUNTER — Ambulatory Visit: Admitting: Nurse Practitioner

## 2024-02-27 ENCOUNTER — Telehealth: Payer: Self-pay | Admitting: Nurse Practitioner

## 2024-02-27 NOTE — Telephone Encounter (Signed)
 Chronic Condition Information completed. Faxed back to [facility]; [phone]. Scanned-Toni

## 2024-08-17 ENCOUNTER — Other Ambulatory Visit: Payer: Self-pay

## 2024-08-17 DIAGNOSIS — I10 Essential (primary) hypertension: Secondary | ICD-10-CM

## 2024-10-04 ENCOUNTER — Encounter: Payer: Self-pay | Admitting: Emergency Medicine

## 2024-10-04 LAB — GLUCOSE, POCT (MANUAL RESULT ENTRY): POC Glucose: 148 mg/dL — AB (ref 70–99)

## 2024-10-04 NOTE — Congregational Nurse Program (Unsigned)
" °  Dept: [phone]   Congregational Nurse Program Note  Date of Encounter: 10/04/2024 Client entered nurse only clinic requesting blood pressure and glucose check. Confidentiality/HIPAA reviewed. Patient reports hx of 3 strokes. States that uses a cane but did not have with him today. Unsteady gait, shuffles feet when walking. High fall risk. States he has no transportation. Blood pressure elevated x 2. Assisted scheduling an appointment with PCP on 10/09/2024. Epic secure chat sent to provider. Client states I feel fine today. Non-fasting blood sugar 148. States he is pre-diabetic. Assisted navigating healthcare system. Clarified does not have transportation coverage with insurance. Assisted with access to public transportation for upcoming appointment. Counseling provided regarding: stroke prevention, hypertension, diabetes, and safety. Client to return to this clinic in 1 week.  Past Medical History: Past Medical History:  Diagnosis Date   Appendicitis 09/24/2021   Appendicitis, acute 09/25/2021   Bleeding stomach ulcer 2018   Brain aneurysm 2014   CAD (coronary artery disease)    Chest pain    CKD (chronic kidney disease) stage 3, GFR 30-59 ml/min (HCC)    Depression    DU (duodenal ulcer)    DVT of leg (deep venous thrombosis) (HCC) 01/16/2020   GAD (generalized anxiety disorder)    Hyperlipidemia    Hypertension    Influenza A 10/02/2023   Myocardial infarction [facility])    2017 & 2019   Radiculopathy of thoracolumbar region    Stroke [facility])    2014 & 2021   SVT (supraventricular tachycardia)    Tachycardia     Encounter Details:  Community Questionnaire - 10/04/24 1045       Questionnaire   Ask client: Do you give verbal consent for me to treat you today? Yes    Student Assistance N/A    Location Patient Served  [facility]    Encounter Setting CN site    Population Status Housed    Insurance Medicare    Insurance/Financial Assistance Referral N/A    Medication N/A     Medical Provider Yes    Medical Referrals Made [facility] PCP/Clinic    Medical Appointment Completed N/A    Screenings CN Performed (remember to also record results) Blood Pressure;Blood Glucose    CN Interventions Hypertension Education    ED Visit Averted N/A            "

## 2024-10-09 ENCOUNTER — Ambulatory Visit: Admitting: Nurse Practitioner
